# Patient Record
Sex: Female | Born: 1935 | Race: Black or African American | Hispanic: No | Smoking: Former smoker
Health system: Southern US, Community
[De-identification: ages and names within clinical notes are randomized; demographics above are authoritative.]

## PROBLEM LIST (undated history)

## (undated) DIAGNOSIS — I2699 Other pulmonary embolism without acute cor pulmonale: Secondary | ICD-10-CM

## (undated) DIAGNOSIS — J449 Chronic obstructive pulmonary disease, unspecified: Secondary | ICD-10-CM

## (undated) DIAGNOSIS — I714 Abdominal aortic aneurysm, without rupture, unspecified: Secondary | ICD-10-CM

## (undated) DIAGNOSIS — Z9981 Dependence on supplemental oxygen: Secondary | ICD-10-CM

## (undated) DIAGNOSIS — I10 Essential (primary) hypertension: Secondary | ICD-10-CM

## (undated) DIAGNOSIS — M1990 Unspecified osteoarthritis, unspecified site: Secondary | ICD-10-CM

## (undated) DIAGNOSIS — E785 Hyperlipidemia, unspecified: Secondary | ICD-10-CM

## (undated) DIAGNOSIS — L089 Local infection of the skin and subcutaneous tissue, unspecified: Secondary | ICD-10-CM

## (undated) DIAGNOSIS — E119 Type 2 diabetes mellitus without complications: Secondary | ICD-10-CM

## (undated) HISTORY — DX: Abdominal aortic aneurysm, without rupture, unspecified: I71.40

## (undated) HISTORY — DX: Chronic obstructive pulmonary disease, unspecified: J44.9

## (undated) HISTORY — DX: Essential (primary) hypertension: I10

## (undated) HISTORY — DX: Local infection of the skin and subcutaneous tissue, unspecified: L08.9

## (undated) HISTORY — DX: Other pulmonary embolism without acute cor pulmonale: I26.99

## (undated) HISTORY — DX: Dependence on supplemental oxygen: Z99.81

## (undated) HISTORY — DX: Type 2 diabetes mellitus without complications: E11.9

## (undated) HISTORY — DX: Hyperlipidemia, unspecified: E78.5

## (undated) HISTORY — PX: VESICOVAGINAL FISTULA CLOSURE W/ TAH: SUR271

## (undated) HISTORY — DX: Unspecified osteoarthritis, unspecified site: M19.90

## (undated) HISTORY — DX: Abdominal aortic aneurysm, without rupture: I71.4

---

## 2000-09-29 ENCOUNTER — Ambulatory Visit (HOSPITAL_COMMUNITY): Admission: RE | Admit: 2000-09-29 | Discharge: 2000-09-29 | Payer: Self-pay | Admitting: Family Medicine

## 2000-09-29 ENCOUNTER — Encounter: Payer: Self-pay | Admitting: Family Medicine

## 2000-10-21 ENCOUNTER — Ambulatory Visit (HOSPITAL_COMMUNITY): Admission: RE | Admit: 2000-10-21 | Discharge: 2000-10-21 | Payer: Self-pay | Admitting: Family Medicine

## 2000-10-21 ENCOUNTER — Encounter: Payer: Self-pay | Admitting: Family Medicine

## 2001-10-11 ENCOUNTER — Encounter: Admission: RE | Admit: 2001-10-11 | Discharge: 2002-01-09 | Payer: Self-pay | Admitting: Family Medicine

## 2001-11-18 ENCOUNTER — Inpatient Hospital Stay (HOSPITAL_COMMUNITY): Admission: EM | Admit: 2001-11-18 | Discharge: 2001-11-18 | Payer: Self-pay | Admitting: Emergency Medicine

## 2005-03-07 ENCOUNTER — Emergency Department (HOSPITAL_COMMUNITY): Admission: EM | Admit: 2005-03-07 | Discharge: 2005-03-07 | Payer: Self-pay | Admitting: *Deleted

## 2010-01-23 ENCOUNTER — Inpatient Hospital Stay (HOSPITAL_COMMUNITY): Admission: EM | Admit: 2010-01-23 | Discharge: 2010-01-28 | Payer: Self-pay | Admitting: Emergency Medicine

## 2010-01-26 ENCOUNTER — Ambulatory Visit: Payer: Self-pay | Admitting: Vascular Surgery

## 2010-01-26 ENCOUNTER — Encounter (INDEPENDENT_AMBULATORY_CARE_PROVIDER_SITE_OTHER): Payer: Self-pay | Admitting: Internal Medicine

## 2010-02-13 ENCOUNTER — Ambulatory Visit: Payer: Self-pay | Admitting: Internal Medicine

## 2010-02-13 DIAGNOSIS — J189 Pneumonia, unspecified organism: Secondary | ICD-10-CM | POA: Insufficient documentation

## 2010-02-13 DIAGNOSIS — J449 Chronic obstructive pulmonary disease, unspecified: Secondary | ICD-10-CM | POA: Insufficient documentation

## 2010-02-13 DIAGNOSIS — J984 Other disorders of lung: Secondary | ICD-10-CM | POA: Insufficient documentation

## 2010-02-13 DIAGNOSIS — I2699 Other pulmonary embolism without acute cor pulmonale: Secondary | ICD-10-CM | POA: Insufficient documentation

## 2010-02-18 ENCOUNTER — Ambulatory Visit: Payer: Self-pay | Admitting: Internal Medicine

## 2010-03-17 ENCOUNTER — Ambulatory Visit: Payer: Self-pay | Admitting: Internal Medicine

## 2010-03-17 LAB — CONVERTED CEMR LAB: Vit D, 25-Hydroxy: 19 ng/mL — ABNORMAL LOW (ref 30–89)

## 2010-03-18 ENCOUNTER — Telehealth: Payer: Self-pay | Admitting: Internal Medicine

## 2010-08-17 ENCOUNTER — Other Ambulatory Visit: Payer: Self-pay | Admitting: Internal Medicine

## 2010-08-17 DIAGNOSIS — R911 Solitary pulmonary nodule: Secondary | ICD-10-CM

## 2010-08-18 NOTE — Progress Notes (Signed)
Summary: vitamin d severe def  Phone Note Outgoing Call   Summary of Call: vitamin d is severely low. Pls tell her to taken Vitamin D3 and Calcium. Will repeat test on return Initial call taken by: Kalman Shan MD,  March 18, 2010 6:05 PM  Follow-up for Phone Call        Called, spoke with pt.  Pt informed of above results and recs per MR.  She is aware the rxs were sent to [facility].  SHe verbalized understanding of these instructions.   Follow-up by: Gweneth Dimitri RN,  March 19, 2010 5:21 PM    New/Updated Medications: VITAMIN D3 1000 UNIT TABS (CHOLECALCIFEROL) take 1 tablet daily DAILY VITAMINS FOR WOMEN  TABS (MULTIPLE VITAMINS-CALCIUM) 1 tablet daily after meals Prescriptions: DAILY VITAMINS FOR WOMEN  TABS (MULTIPLE VITAMINS-CALCIUM) 1 tablet daily after meals  #30 x 6   Entered and Authorized by:   Kalman Shan MD   Signed by:   Kalman Shan MD on 03/18/2010   Method used:   [facility]ronically to        [facility] [address] [phone]* (retail)       [address]       [place], [place]  [address]       Ph: [phone] or [phone]       Fax: [phone]   RxID:   [id number] VITAMIN D3 1000 UNIT TABS (CHOLECALCIFEROL) take 1 tablet daily  #30 x 6   Entered and Authorized by:   Kalman Shan MD   Signed by:   Kalman Shan MD on 03/18/2010   Method used:   Electronically to        [facility] [address] [phone]* (retail)       [address]       [place], [place]  [address]       Ph: [phone] or [phone]       Fax: [phone]   RxID:   [id number]

## 2010-08-18 NOTE — Assessment & Plan Note (Signed)
Summary: follow up per MR/ MS   Visit Type:  Follow-up Copy to:  [facility] Primary Kellis Topete/Referring Bethannie Iglehart:  Dr. Ronne Binning  CC:  Pt here for follow-up to review PFT results. Pt states no changes in breathing.  Marland Kitchen  History of Present Illness: IOV 02/13/2010: 77 year old AA female admitted early July 2011 for hempptysius related to Acute PE (only discernible risk factor was smoking per hospitalist). Other diagnosis was O2 dependent COPD, LL PNA, and 2 x 6 mm pulmonary nodules on CXR. All these diagnosis were newly made. She has now seeing me for first time for eval. She states she is better since discharge. However, she is not aware she has COPD. She has stopped smoking. She is not using o2 all the time (pox 87% ra at rest in office). She is noncomplaint with symbicort and has poor understanding of its role. Subjectively no dyspnea for ADLs at home (otherwise sedentary). Some mild cough. No hemoptysis. No weight los. Has quit smoking. Denies edema, chest pain, fever, chills.    REC: PFTs CXR at fu Continue symbicort Continue coumadin  OV March 17, 2010: Followup for COPD, LLL PNA in July 2011, PE July 2011, Pulmonary nodules. No interim complaints. STable health. STable dyspnea. Despite etensive education last visit about taking symbicort she is not using it. States "pharmacy did not have med". She is mostly non-compliant with o2 as well. States she is compliant with other meds. No other issues. PFTs 02/18/2010 shows GOLD STAGE 2 COPD - fev1 `.35L/64%, DLCO 36%. CXR today - shows clerance of LLL PNA   Preventive Screening-Counseling & Management  Alcohol-Tobacco     Smoking Status: quit     Smoking Cessation Counseling: yes     Smoke Cessation Stage: quit     Packs/Day: 0.5     Year Started: 1955     Year Quit: 2011     Pack years: 21     Tobacco Counseling: not to resume use of tobacco products  Current Medications (verified): 1)  Nicoderm Cq 14 Mg/24hr Pt24 (Nicotine) ....  Every 24 Hours For 10 Days  and Then 7 Mg For 10 Days 2)  Coumadin 5 Mg Tabs (Warfarin Sodium) .... Take On At 6:00 Pm On An Empty Stomach 3)  Oxygen 1-2 Liters .... 24/7 4)  Glipizide 5 Mg Tabs (Glipizide) .... By Mouth Two Times A Day 5)  Lovastatin 20 Mg Tabs (Lovastatin) .... By Mouth Every Night At Sleep 6)  Maxzide-25 37.5-25 Mg Tabs (Triamterene-Hctz) .... One Tablet Daily 7)  Toprol Xl 100 Mg Xr24h-Tab (Metoprolol Succinate) .... By Mouth Daily 8)  Metformin Hcl 500 Mg Tabs (Metformin Hcl) .Marland Kitchen.. 1 Tablet By Mouth Twice A Day  Allergies (verified): 1)  ! Pcn  Past History:  Family History: Last updated: 02/13/2010 Heart disease--Mother, 3 sisters Clotting disorder--daughter, sons  Social History: Last updated: 02/13/2010 Occupation--retired--domestic worker Lives with grandchildren Widowed Patient states former smoker. quit 01/23/2010. 1/2 ppd. Started smoking age 72   Denies alcohol or illicit   drug use.  She is a retired Ambulance person.  Her grandson lives with   her.   Risk Factors: Smoking Status: quit (03/17/2010) Packs/Day: 0.5 (03/17/2010)  Past medical, surgical, family and social histories (including risk factors) reviewed, and no changes noted (except as noted below).  Past Medical History: Reviewed history from 02/12/2010 and no changes required. Acute pulmonary embolism COPD DM Dyslipidemia hypertension  Past Surgical History: Reviewed history from 02/13/2010 and no changes required. hysterectomy  Past Pulmonary History:  Pulmonary History: DATE OF ADMISSION:  01/23/2010   DATE OF DISCHARGE:  01/28/2010      DISCHARGE DIAGNOSIS:   1. Acute pulmonary embolism. - presented wit hhemoptysis  2. Chronic obstructive pulmonary disease, oxygen dependent.   3. Diabetes mellitus.   4. Dyslipidemia.   5. Hypertension.   6. Tobacco abuse, has a desire to quit after discharge.    7. 6mm pulmonary nodules x 2 8. Small Left basal infiltrate and  atelectasis on CXR/CT  Family History: Reviewed history from 02/13/2010 and no changes required. Heart disease--Mother, 3 sisters Clotting disorder--daughter, sons  Social History: Reviewed history from 02/13/2010 and no changes required. Occupation--retired--domestic worker Lives with grandchildren Widowed Patient states former smoker. quit 01/23/2010. 1/2 ppd. Started smoking age 26   Denies alcohol or illicit   drug use.  She is a retired Ambulance person.  Her grandson lives with   her.  Smoking Status:  quit Packs/Day:  0.5  Review of Systems       The patient complains of shortness of breath with activity.  The patient denies shortness of breath at rest, productive cough, non-productive cough, coughing up blood, chest pain, irregular heartbeats, acid heartburn, indigestion, loss of appetite, weight change, abdominal pain, difficulty swallowing, sore throat, tooth/dental problems, headaches, nasal congestion/difficulty breathing through nose, sneezing, itching, ear ache, anxiety, depression, hand/feet swelling, joint stiffness or pain, rash, change in color of mucus, and fever.    Vital Signs:  Patient profile:   77 year old female Height:      65 inches Weight:      169.50 pounds BMI:     28.31 O2 Sat:      98 % on 2 L/min Temp:     97.8 degrees F oral Pulse rate:   78 / minute BP sitting:   132 / 72  (right arm) Cuff size:   regular  Vitals Entered By: Carron Curie CMA (March 17, 2010 1:53 PM)  O2 Flow:  2 L/min  Serial Vital Signs/Assessments:  Comments: Ambulatory Pulse Oximetry  Resting; HR__78___    02 Sat_97____ room air  Lap1 (185 feet)   HR___94__   02 Sat__91___ Lap2 (185 feet)   HR__98___   02 Sat__87___  placed pt on 2 liters o2 and sats increased to 97% after 2 mins   Lap3 (185 feet)   HR_____   02 Sat_____  ___Test Completed without Difficulty _x__Test Stopped due pt desaturated   By: Carron Curie CMA   CC: Pt here for follow-up to  review PFT results. Pt states no changes in breathing.     Physical Exam  General:  well developed, well nourished, in no acute distresson supplemental oxygen.   Head:  normocephalic and atraumatic Eyes:  PERRLA/EOM intact; conjunctiva and sclera clear Ears:  TMs intact and clear with normal canals Nose:  no deformity, discharge, inflammation, or lesions Mouth:  no deformity or lesions Neck:  no masses, thyromegaly, or abnormal cervical nodes Chest Wall:  no deformities noted Lungs:  clear bilaterally to auscultation and percussion Heart:  regular rate and rhythm, S1, S2 without murmurs, rubs, gallops, or clicks Abdomen:  bowel sounds positive; abdomen soft and non-tender without masses, or organomegaly Msk:  no deformity or scoliosis noted with normal posture Pulses:  pulses normal Extremities:  no clubbing, cyanosis, edema, or deformity noted Neurologic:  CN II-XII grossly intact with normal reflexes, coordination, muscle strength and tone Skin:  intact without lesions or rashes Cervical Nodes:  no significant adenopathy Axillary Nodes:  no significant adenopathy Psych:  alert and cooperative; normal mood and affect; normal attention span and concentration   MISC. Report  Procedure date:  02/18/2010  Findings:      PFTs 02/18/2010 shows GOLD STAGE 2 COPD - fev1 `.35L/64%, DLCO 36%  Comments:      independently reviewed trace  CXR  Procedure date:  03/17/2010  Findings:      shows clearance of LLL PNA  Comments:      indepoendently reviewed. Official report pending  Impression & Recommendations:  Problem # 1:  PNEUMONIA, ORGANISM UNSPECIFIED (ICD-486) Assessment Improved Cleared on CXR today.  plan no further fu Orders: T-2 View CXR (71020TC) Est. Patient Level IV ([address])  Problem # 2:  C O P D (ICD-496) Assessment: Unchanged Gold stage 2 COPD. STable diseaes. Desaturated 03/17/2010 after 185 feet of exertion on room air. Non compliant with symbicort for no  clear reason and with O2.  PLAN reeducated on copd stop symbicort try spiriva -1 pouff daily (simpler mdi) test for o2 need check Vitamin D next visit check alpha 1 at fu will refer rehab contnue o2  Problem # 3:  PULMONARY NODULE (ICD-518.89) Assessment: Comment Only  2 x 6 mm nodules. Smoking hx  plan repeat ct end of 2011 (6th month CT)  Orders: Est. Patient Level IV (40981) Radiology Referral (Radiology)  Problem # 4:  PE (ICD-415.19) Assessment: Unchanged  Her updated medication list for this problem includes:    Coumadin 5 Mg Tabs (Warfarin sodium) .Marland Kitchen... Take on at 6:00 pm on an empty stomach  Orders: T- * Misc. Laboratory test [phone]) Est. Patient Level IV ([address]56)  continue coumadin will need 6 month minimum then will dc coumadin and check hypercoag panel and decide if she should take it longer currently tolerating it well Her updated medication list for this problem includes:    Coumadin 5 Mg Tabs (Warfarin sodium) .Marland Kitchen... Take on at 6:00 pm on an empty stomach  Medications Added to Medication List This Visit: 1)  Spiriva Handihaler 18 Mcg Caps (Tiotropium bromide monohydrate) .... One puffs in handihaler daily  Other Orders: Prescription Created Electronically [phone]) HFA Instruction [phone])  Patient Instructions: 1)  #COPD 2)  YOu have moderate COPD 3)  Nurse will walk  you on room air to look at your oxygen levels 4)  stop symbicort 5)  start spiriva 1 puff daily - take 2 samples 6)  learn technique from my nurse 7)  next visit we will check alpha 1 antitypsin gene 8)  #PNEUMONIA 9)  this has cleared up on cxr today  10)  #LUNG NODULE 11)  need Ct scan chest end of this year 12)  #BLOOD CLOT 13)  continue coumadin - where are you getting bood checked ? 14)  #HEALTH MAINTENANCE 15)  get flu shot asap 16)  check vitamin D level today 17)  #FOLLOWUP 18)  return in 6 months with CT scan chst 19)  call or come sooner for any new problems 20)   stay away from sick people in fall/winter Prescriptions: SPIRIVA HANDIHALER 18 MCG  CAPS (TIOTROPIUM BROMIDE MONOHYDRATE) one puffs in handihaler daily  #1 x 6   Entered and Authorized by:   Kalman Shan MD   Signed by:   Kalman Shan MD on 03/17/2010   Method used:   [facility]ically to        [facility] [address] [phone]* (retail)  [address]       [place], [place]  [address]       Ph: [phone] or [phone]       Fax: [phone]   RxID:   [id number]

## 2010-08-18 NOTE — Assessment & Plan Note (Signed)
Summary: copd,bronchiactesis/apc   Visit Type:  Initial Consult Copy to:  [facility] Primary Provider/Referring Provider:  Dr. Ronne Binning  CC:  possible COPD and Bronchiactesis, Pt states breathing is doing fine, non producitve cough, and pt states she is better since being out of the hospital.  History of Present Illness: IOV 02/13/2010: 77 year old AA female admitted early July 2011 for hempptysius related to Acute PE (only discernible risk factor was smoking per hospitalist). Other diagnosis was O2 dependent COPD, LL PNA, and 2 x 6 mm pulmonary nodules on CXR. All these diagnosis were newly made. She has now seeing me for first time for eval. She states she is better since discharge. However, she is not aware she has COPD. She has stopped smoking. She is not using o2 all the time (pox 87% ra at rest in office). She is noncomplaint with symbicort and has poor understanding of its role. Subjectively no dyspnea for ADLs at home (otherwise sedentary). Some mild cough. No hemoptysis. No weight los. Has quit smoking. Denies edema, chest pain, fever, chills.   Preventive Screening-Counseling & Management  Alcohol-Tobacco     Smoking Status: quit < 6 months     Packs/Day: 1/2ppd     Year Started: 1955     Year Quit: 2011     Pack years: 57  Current Medications (verified): 1)  Symbicort 160-4.5 Mcg/act Aero (Budesonide-Formoterol Fumarate) .... One Puff Two Times A Day 2)  Nicoderm Cq 14 Mg/24hr Pt24 (Nicotine) .... Every 24 Hours For 10 Days  and Then 7 Mg For 10 Days 3)  Coumadin 5 Mg Tabs (Warfarin Sodium) .... Take On At 6:00 Pm On An Empty Stomach 4)  Oxygen 1-2 Liters .... 24/7 5)  Glipizide 5 Mg Tabs (Glipizide) .... By Mouth Two Times A Day 6)  Lovastatin 20 Mg Tabs (Lovastatin) .... By Mouth Every Night At Sleep 7)  Maxzide-25 37.5-25 Mg Tabs (Triamterene-Hctz) .... One Tablet Daily 8)  Toprol Xl 100 Mg Xr24h-Tab (Metoprolol Succinate) .... By Mouth Daily 9)  Metformin Hcl 500 Mg  Tabs (Metformin Hcl) .Marland Kitchen.. 1 Tablet By Mouth Twice A Day  Allergies (verified): 1)  ! Pcn  Past History:  Past Medical History: Last updated: 02/12/2010 Acute pulmonary embolism COPD DM Dyslipidemia hypertension  Family History: Last updated: 02/13/2010 Heart disease--Mother, 3 sisters Clotting disorder--daughter, sons  Social History: Last updated: 02/13/2010 Occupation--retired--domestic worker Lives with grandchildren Widowed Patient states former smoker. quit 01/23/2010. 1/2 ppd. Started smoking age 47   Denies alcohol or illicit   drug use.  She is a retired Ambulance person.  Her grandson lives with   her.   Risk Factors: Smoking Status: quit < 6 months (02/13/2010) Packs/Day: 1/2ppd (02/13/2010)  Past Surgical History: hysterectomy  Past Pulmonary History:  Pulmonary History: DATE OF ADMISSION:  01/23/2010   DATE OF DISCHARGE:  01/28/2010      DISCHARGE DIAGNOSIS:   1. Acute pulmonary embolism. - presented wit hhemoptysis  2. Chronic obstructive pulmonary disease, oxygen dependent.   3. Diabetes mellitus.   4. Dyslipidemia.   5. Hypertension.   6. Tobacco abuse, has a desire to quit after discharge.    7. 6mm pulmonary nodules x 2 8. 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 quit 01/23/2010. 1/2 ppd. Started smoking age 73   Denies alcohol or illicit   drug use.  She  is a retired Ambulance person.  Her grandson lives with   her.  Smoking Status:  quit < 6 months Packs/Day:  1/2ppd Pack years:  27  Review of Systems       Haven't coughed up blood since out of hospital  Vital Signs:  Patient profile:   77 year old female Height:      65 inches Weight:      156 pounds BMI:     26.05 O2 Sat:      95 % on 2 L/min Temp:     98.4  degrees F oral Pulse rate:   85 / minute BP sitting:   130 / 80  (right arm) Cuff size:   regular  Vitals Entered By: Carver Fila (February 13, 2010 2:27 PM)  O2 Flow:  2 L/min CC: possible COPD and Bronchiactesis, Pt states breathing is doing fine, non producitve cough, pt states she is better since being out of the hospital Comments meds and allergies udpated Phone number updated Carver Fila  February 13, 2010 2:26 PM    Physical Exam  General:  well developed, well nourished, in no acute distresson supplemental oxygen.   Head:  normocephalic and atraumatic Eyes:  PERRLA/EOM intact; conjunctiva and sclera clear Ears:  TMs intact and clear with normal canals Nose:  no deformity, discharge, inflammation, or lesions Mouth:  no deformity or lesions Neck:  no masses, thyromegaly, or abnormal cervical nodes Chest Wall:  no deformities noted Lungs:  clear bilaterally to auscultation and percussion Heart:  regular rate and rhythm, S1, S2 without murmurs, rubs, gallops, or clicks Abdomen:  bowel sounds positive; abdomen soft and non-tender without masses, or organomegaly Msk:  no deformity or scoliosis noted with normal posture Pulses:  pulses normal Extremities:  no clubbing, cyanosis, edema, or deformity noted Neurologic:  CN II-XII grossly intact with normal reflexes, coordination, muscle strength and tone Skin:  intact without lesions or rashes Cervical Nodes:  no significant adenopathy Axillary Nodes:  no significant adenopathy Psych:  alert and cooperative; normal mood and affect; normal attention span and concentration   CT of Chest  Procedure date:  01/24/2010  Findings:      Positive CTA chest for pulmonary embolism, with multiple pulmonary   emboli identified bilaterally.   COPD with bronchitic changes, bibasilar atelectasis and   bronchiectasis.   Two nonspecific subpleural nodular foci are identified in the right   upper lobe, largest 6 mm diameter, recommendations  below.  Comments:      independently reviewed  Impression & Recommendations:  Problem # 1:  C O P D (ICD-496) Assessment New New diagnosis. Looks like she is o2 dependent. No understanding of disease. No understanding of need to be on maintenatnce mdi. BRief edcuation done. Symbicort intake rules emphasized. MDI tech taught. Return with PFTs. AT return wil check Vitamin D and  alpha-1-antitrypsin  Problem # 2:  PULMONARY NODULE (ICD-518.89) Assessment: New  2 x 6 mm nodules. Smoking hx  plan repeat ct end 2011 (6th month CT)  Orders: Consultation Level V ([address])  Problem # 3:  PNEUMONIA, ORGANISM UNSPECIFIED (ICD-486) Assessment: New  LLL PNA  plan repeat cxr 1 month The following medications were removed from the medication list:    Avelox 400 Mg Tabs (Moxifloxacin hcl) ..... Once daily fro 5 days  Orders: Consultation Level V ([address]454)  Problem # 4:  PE (ICD-415.19) Assessment: New  continue coumadin will need 6 month minimum then will dc coumadin and check hypercoag panel  and decide if she should take it longer currently tolerating it well Her updated medication list for this problem includes:    Coumadin 5 Mg Tabs (Warfarin sodium) .Marland Kitchen... Take on at 6:00 pm on an empty stomach  Orders: Consultation Level V ([address])  Medications Added to Medication List This Visit: 1)  Metformin Hcl 500 Mg Tabs (Metformin hcl) .Marland Kitchen.. 1 tablet by mouth twice a day  Patient Instructions: 1)  Please have full breathing test and return to see me 2)  when you come back we will check vitamin D level  and your blood for alpha 1 antitrypsin which is a genetic cause of copd 3)  you have copd which is lung damage from smoking 4)  Please take your symbicort inhaler 2 puff two times a day 5)  show your technique and learn it from my nurse 6)  take a sample symbicort with you 7)  cxr at followup in 1 month 8)  return after pft  Appended Document: copd,bronchiactesis/apc pft 02/18/2010  shows gold stage 2 copd. Pleaes ensure fu.   Appended Document: copd,bronchiactesis/apc ATCP x1 no vm to leave a message will try to call back later  Appended Document: copd,bronchiactesis/apc Called and informed pt of the results and pt stated she verbally understood. Pt has a f/u with MR for 03/17/2010 at 1:30 pm

## 2010-08-18 NOTE — Miscellaneous (Signed)
Summary: Orders Update pft charges  Clinical Lists Changes  Orders: Added new Service order of Carbon Monoxide diffusing w/capacity (94720) - Signed Added new Service order of Lung Volumes (94240) - Signed Added new Service order of Spirometry (Pre & Post) (94060) - Signed 

## 2010-09-16 ENCOUNTER — Ambulatory Visit (INDEPENDENT_AMBULATORY_CARE_PROVIDER_SITE_OTHER)
Admission: RE | Admit: 2010-09-16 | Discharge: 2010-09-16 | Disposition: A | Discharge status: Home / Self Care | Payer: Medicare Other | Source: Ambulatory Visit | Attending: Internal Medicine | Admitting: Internal Medicine

## 2010-09-16 DIAGNOSIS — J984 Other disorders of lung: Secondary | ICD-10-CM

## 2010-09-16 DIAGNOSIS — R911 Solitary pulmonary nodule: Secondary | ICD-10-CM

## 2010-09-21 ENCOUNTER — Encounter: Payer: Self-pay | Admitting: Internal Medicine

## 2010-09-21 ENCOUNTER — Ambulatory Visit (INDEPENDENT_AMBULATORY_CARE_PROVIDER_SITE_OTHER): Payer: Medicare Other | Admitting: Internal Medicine

## 2010-09-21 ENCOUNTER — Other Ambulatory Visit: Payer: Self-pay | Admitting: Internal Medicine

## 2010-09-21 DIAGNOSIS — J449 Chronic obstructive pulmonary disease, unspecified: Secondary | ICD-10-CM

## 2010-09-21 DIAGNOSIS — E559 Vitamin D deficiency, unspecified: Secondary | ICD-10-CM | POA: Insufficient documentation

## 2010-09-21 DIAGNOSIS — J984 Other disorders of lung: Secondary | ICD-10-CM

## 2010-09-21 DIAGNOSIS — R911 Solitary pulmonary nodule: Secondary | ICD-10-CM

## 2010-09-29 NOTE — Assessment & Plan Note (Addendum)
Summary: f/u pulmonary nodule   Visit Type:  Follow-up Copy to:  [facility] Primary Provider/Referring Provider:  Dr. Ronne Binning  CC:  Pt here for follow-up. Marland Kitchen  History of Present Illness:  77 year old AA female with Idiopathic Acute PE July 2011 , O2 dependent Gold stage 2 COPD (87% RA at rest, FEv1 64%, DLCO 36%),  2 x 6 mm pulmonary nodules on CXR  and Vit D Deficiency. . All these diagnosis were newly made.    September 21, 2010: Fu for above. Poor historian. Reports stability. Somewhat compliant wiht spiriva. Alpha 1 sent today. Now on Vit D replacement, she is not sure it is helping but insists she is compliant.  Using o2 only at night. 6 mont CT shows stability of pulmonary nodules. Continues with coumadin per hx. Has completed over 6 months of coumadin - monitoring by PMD. She states INR is > 2 most of the time. No new issues.    Preventive Screening-Counseling & Management  Alcohol-Tobacco     Smoking Status: quit     Smoking Cessation Counseling: yes     Smoke Cessation Stage: quit     Packs/Day: 0.5     Year Started: 1957     Year Quit: 2011     Pack years: 55     Tobacco Counseling: not to resume use of tobacco products  Current Medications (verified): 1)  Nicoderm Cq 14 Mg/24hr Pt24 (Nicotine) .... Every 24 Hours For 10 Days  and Then 7 Mg For 10 Days 2)  Coumadin 5 Mg Tabs (Warfarin Sodium) .... Take On At 6:00 Pm On An Empty Stomach 3)  Oxygen 1-2 Liters .... 24/7 4)  Glipizide 5 Mg Tabs (Glipizide) .... By Mouth Two Times A Day 5)  Lovastatin 20 Mg Tabs (Lovastatin) .... By Mouth Every Night At Sleep 6)  Maxzide-25 37.5-25 Mg Tabs (Triamterene-Hctz) .... One Tablet Daily 7)  Toprol Xl 100 Mg Xr24h-Tab (Metoprolol Succinate) .... By Mouth Daily 8)  Metformin Hcl 500 Mg Tabs (Metformin Hcl) .Marland Kitchen.. 1 Tablet By Mouth Twice A Day 9)  Spiriva Handihaler 18 Mcg Caps (Tiotropium Bromide Monohydrate) .... Once Daily 10)  Vitamin D3 1000 Unit Tabs (Cholecalciferol) ....  Take 1 Tablet By Mouth Once A Day 11)  Multivitamins  Tabs (Multiple Vitamin) .... Take 1 Tablet By Mouth Once A Day  Allergies (verified): 1)  ! Pcn  Past History:  Past medical, surgical, family and social histories (including risk factors) reviewed, and no changes noted (except as noted below).  Past Medical History: Reviewed history from 02/12/2010 and no changes required. Acute pulmonary embolism COPD DM Dyslipidemia hypertension  Past Surgical History: Reviewed history from 02/13/2010 and no changes required. hysterectomy  Past Pulmonary History:  Pulmonary History: DATE OF ADMISSION:  01/23/2010   DATE OF DISCHARGE:  01/28/2010      DISCHARGE DIAGNOSIS:   1. Acute pulmonary embolism. - presented wit hhemoptysis  2. Chronic obstructive pulmonary disease, oxygen dependent.   3. Diabetes mellitus.   4. Dyslipidemia.   5. Hypertension.   6. Tobacco abuse, has a desire to quit after discharge.    7. 6mm pulmonary nodules x 2 8. Small Left basal infiltrate and atelectasis on CXR/CT  Family History: Reviewed history from 02/13/2010 and no changes required. Heart disease--Mother, 3 sisters Clotting disorder--daughter, sons  Social History: Reviewed history from 02/13/2010 and no changes required. Occupation--retired--domestic worker Lives with grandchildren Widowed Patient states former smoker. quit 01/23/2010. 1/2 ppd. Started smoking age 65  Denies alcohol or illicit   drug use.  She is a retired Ambulance person.  Her grandson lives with   her.   Review of Systems       The patient complains of shortness of breath with activity, productive cough, and change in color of mucus.  The patient denies shortness of breath at rest, non-productive cough, coughing up blood, chest pain, irregular heartbeats, acid heartburn, indigestion, loss of appetite, weight change, abdominal pain, difficulty swallowing, sore throat, tooth/dental problems, headaches, nasal  congestion/difficulty breathing through nose, sneezing, itching, ear ache, anxiety, depression, hand/feet swelling, joint stiffness or pain, rash, and fever.    Vital Signs:  Patient profile:   77 year old female Height:      65 inches Weight:      184 pounds BMI:     30.73 O2 Sat:      91 % on Room air Temp:     97.9 degrees F oral Pulse rate:   81 / minute BP sitting:   124 / 88  (right arm) Cuff size:   regular  Vitals Entered By: Carron Curie CMA (September 21, 2010 10:57 AM)  O2 Flow:  Room air CC: Pt here for follow-up.  Comments Medications reviewed with patient Carron Curie CMA  September 21, 2010 10:57 AM Daytime phone number verified with patient.    Physical Exam  General:  well developed, well nourished, in no acute distresson supplemental oxygen.   Head:  normocephalic and atraumatic Eyes:  PERRLA/EOM intact; conjunctiva and sclera clear Ears:  TMs intact and clear with normal canals Nose:  no deformity, discharge, inflammation, or lesions Mouth:  no deformity or lesions Neck:  no masses, thyromegaly, or abnormal cervical nodes Chest Wall:  no deformities noted Lungs:  clear bilaterally to auscultation and percussion Heart:  regular rate and rhythm, S1, S2 without murmurs, rubs, gallops, or clicks Abdomen:  bowel sounds positive; abdomen soft and non-tender without masses, or organomegaly Msk:  no deformity or scoliosis noted with normal posture Pulses:  pulses normal Extremities:  no clubbing, cyanosis, edema, or deformity noted Neurologic:  CN II-XII grossly intact with normal reflexes, coordination, muscle strength and tone Skin:  intact without lesions or rashes Cervical Nodes:  no significant adenopathy Axillary Nodes:  no significant adenopathy Psych:  alert and cooperative; normal mood and affect; normal attention span and concentration   CT of Chest  Procedure date:  09/16/2010  Findings:      unchanged pulmonary nodules   Comments:       independently reviewed  Impression & Recommendations:  Problem # 1:  PE (ICD-415.19) Assessment Unchanged Has completed over 6 months of coumadin Rx for idiopathic PE. I wil talk to her pmd and if INR >2 for most of this time, will stop coumadin and retest d-dimer and hypercoag profile to decide on daily aspirin versus continued coumadin Her updated medication list for this problem includes:    Coumadin 5 Mg Tabs (Warfarin sodium) .Marland Kitchen... Take on at 6:00 pm on an empty stomach  Orders: Rehabilitation Referral (Rehab)  Problem # 2:  PULMONARY NODULE (ICD-518.89) Assessment: Unchanged  Orders: Radiology Referral (Radiology) Est. Patient Level IV (99214)  2 x 6 mm nodules. Smoking hx July 2011 No change on CT feb 2012  plan repeat ct end Aug 2012 (1 year CT)  Problem # 3:  C O P D (ICD-496) Assessment: Unchanged  Gold stage 2 COPD. STable diseaes. Desaturated 03/17/2010 after 185 feet of exertion on  room air. Semi compliant with O2 and spiriva  plan daily spiriva use o2 await alpha 1 - sent today advisedrehab again -she refused again evaluate for COPD [facility] STUDY - she is interested in this  Problem # 4:  UNSPECIFIED VITAMIN D DEFICIENCY (ICD-268.9) Assessment: Unchanged  continue vit d3 1000 units daily and calcium daily recheck levels at fu  Orders: Est. Patient Level IV (16109)  Medications Added to Medication List This Visit: 1)  Spiriva Handihaler 18 Mcg Caps (Tiotropium bromide monohydrate) .... Once daily 2)  Vitamin D3 1000 Unit Tabs (Cholecalciferol) .... Take 1 tablet by mouth once a day 3)  Multivitamins Tabs (Multiple vitamin) .... Take 1 tablet by mouth once a day  Other Orders: Prescription Created Electronically [id number])  Patient Instructions: 1)  #COPD 2)  YOu have moderate COPD 3)  continue spiriva 1 puff daily - take 2 samples 4)   we will await  alpha 1 antitypsin gene result that was checked  5)  atend pulm rehab 6)  i will have research  nurse contact you 7)  #LUNG NODULE 8)  need Ct scan chest in 6 months 9)  #BLOOD CLOT 10)  - after I talk to Dr. Aline Brochure you can stop coumadin 11)  #VIT D DEF 12)  - contnue vit d and calcium 13)  #FOLLOWUP 14)  return in 6 months with CT scan chst 15)  call or come sooner for any new problems 16)  stay away from sick people in fall/winter Prescriptions: SPIRIVA HANDIHALER 18 MCG CAPS (TIOTROPIUM BROMIDE MONOHYDRATE) once daily  #1 x 6   Entered and Authorized by:   Kalman Shan MD   Signed by:   Kalman Shan MD on 09/21/2010   Method used:   Electronically to        [facility] [address] [id number][facility] (retail)       [address]       [place], [place]  [address]       Ph: [phone] or [phone]       Fax: [phone]   RxID:   [id number]   Appended Document: f/u pulmonary nodule gget hold of PMD for me please  Appended Document: f/u pulmonary nodule ATC to call PCP several times no answer and unable to leave a message. Sounded like call was transferred to MD cell number.

## 2010-10-04 LAB — CBC
HCT: 37.3 % (ref 36.0–46.0)
HCT: 37.6 % (ref 36.0–46.0)
HCT: 39.5 % (ref 36.0–46.0)
Hemoglobin: 12.6 g/dL (ref 12.0–15.0)
Hemoglobin: 12.6 g/dL (ref 12.0–15.0)
MCH: 28 pg (ref 26.0–34.0)
MCH: 28.2 pg (ref 26.0–34.0)
MCH: 28.4 pg (ref 26.0–34.0)
MCH: 28.5 pg (ref 26.0–34.0)
MCHC: 33.5 g/dL (ref 30.0–36.0)
MCHC: 33.6 g/dL (ref 30.0–36.0)
MCHC: 33.9 g/dL (ref 30.0–36.0)
MCV: 83.6 fL (ref 78.0–100.0)
MCV: 83.8 fL (ref 78.0–100.0)
MCV: 83.8 fL (ref 78.0–100.0)
MCV: 84.1 fL (ref 78.0–100.0)
MCV: 84.3 fL (ref 78.0–100.0)
Platelets: 159 10*3/uL (ref 150–400)
Platelets: 172 10*3/uL (ref 150–400)
RBC: 4.49 MIL/uL (ref 3.87–5.11)
RBC: 4.69 MIL/uL (ref 3.87–5.11)
RDW: 15.1 % (ref 11.5–15.5)
RDW: 15.1 % (ref 11.5–15.5)
RDW: 15.3 % (ref 11.5–15.5)
WBC: 7.7 10*3/uL (ref 4.0–10.5)
WBC: 9.3 10*3/uL (ref 4.0–10.5)

## 2010-10-04 LAB — GLUCOSE, CAPILLARY
Glucose-Capillary: 111 mg/dL — ABNORMAL HIGH (ref 70–99)
Glucose-Capillary: 113 mg/dL — ABNORMAL HIGH (ref 70–99)
Glucose-Capillary: 116 mg/dL — ABNORMAL HIGH (ref 70–99)
Glucose-Capillary: 120 mg/dL — ABNORMAL HIGH (ref 70–99)
Glucose-Capillary: 123 mg/dL — ABNORMAL HIGH (ref 70–99)
Glucose-Capillary: 127 mg/dL — ABNORMAL HIGH (ref 70–99)
Glucose-Capillary: 141 mg/dL — ABNORMAL HIGH (ref 70–99)
Glucose-Capillary: 145 mg/dL — ABNORMAL HIGH (ref 70–99)
Glucose-Capillary: 163 mg/dL — ABNORMAL HIGH (ref 70–99)
Glucose-Capillary: 73 mg/dL (ref 70–99)
Glucose-Capillary: 78 mg/dL (ref 70–99)
Glucose-Capillary: 80 mg/dL (ref 70–99)

## 2010-10-04 LAB — HEPARIN LEVEL (UNFRACTIONATED)
Heparin Unfractionated: 0.26 IU/mL — ABNORMAL LOW (ref 0.30–0.70)
Heparin Unfractionated: 0.36 IU/mL (ref 0.30–0.70)

## 2010-10-04 LAB — HEMOGLOBIN A1C
Hgb A1c MFr Bld: 6.6 % — ABNORMAL HIGH (ref <5.7)
Mean Plasma Glucose: 143 mg/dL — ABNORMAL HIGH (ref <117)

## 2010-10-04 LAB — BASIC METABOLIC PANEL
BUN: 9 mg/dL (ref 6–23)
CO2: 26 mEq/L (ref 19–32)
Chloride: 101 mEq/L (ref 96–112)
Glucose, Bld: 128 mg/dL — ABNORMAL HIGH (ref 70–99)
Potassium: 4.3 mEq/L (ref 3.5–5.1)
Sodium: 137 mEq/L (ref 135–145)

## 2010-10-04 LAB — COMPREHENSIVE METABOLIC PANEL
AST: 16 U/L (ref 0–37)
Albumin: 3.4 g/dL — ABNORMAL LOW (ref 3.5–5.2)
BUN: 13 mg/dL (ref 6–23)
Calcium: 8.6 mg/dL (ref 8.4–10.5)
Creatinine, Ser: 1.04 mg/dL (ref 0.4–1.2)
GFR calc Af Amer: >60 mL/min (ref >60)
GFR calc non Af Amer: 52 mL/min — ABNORMAL LOW (ref >60)
Total Bilirubin: 0.6 mg/dL (ref 0.3–1.2)

## 2010-10-04 LAB — DIFFERENTIAL
Basophils Absolute: 0 10*3/uL (ref 0.0–0.1)
Basophils Relative: 0 % (ref 0–1)
Eosinophils Absolute: 0.1 10*3/uL (ref 0.0–0.7)
Eosinophils Relative: 1 % (ref 0–5)
Monocytes Absolute: 0.1 10*3/uL (ref 0.1–1.0)
Neutro Abs: 8.8 10*3/uL — ABNORMAL HIGH (ref 1.7–7.7)

## 2010-10-04 LAB — PROTIME-INR
INR: 1.19 (ref 0.00–1.49)
INR: 1.46 (ref 0.00–1.49)
Prothrombin Time: 17.6 seconds — ABNORMAL HIGH (ref 11.6–15.2)

## 2010-10-04 LAB — T4, FREE: Free T4: 0.92 ng/dL (ref 0.80–1.80)

## 2010-10-04 LAB — POCT I-STAT, CHEM 8
Calcium, Ion: 1.07 mmol/L — ABNORMAL LOW (ref 1.12–1.32)
Glucose, Bld: 212 mg/dL — ABNORMAL HIGH (ref 70–99)
HCT: 41 % (ref 36.0–46.0)
Hemoglobin: 13.9 g/dL (ref 12.0–15.0)
TCO2: 26 mmol/L (ref 0–100)

## 2010-10-05 ENCOUNTER — Telehealth: Payer: Self-pay | Admitting: Internal Medicine

## 2010-10-15 ENCOUNTER — Telehealth: Payer: Self-pay | Admitting: *Deleted

## 2010-10-15 ENCOUNTER — Encounter: Payer: Self-pay | Admitting: *Deleted

## 2010-10-15 NOTE — Telephone Encounter (Signed)
Summary of Call: given difficulty getting hold of PMD, have patinet come in to discuss coumadin Rx and have her bring in her PMD contact information wiht her Initial call taken by: Alejandra Shan MD,  October 05, 2010 9:47 PM  ATC pt several times. No answer and no voicemail...number sounds like a fax machine. I will send a letter to the patient to ask her to call our office to schedule an appointment. Alejandra [name], CMA

## 2010-10-20 ENCOUNTER — Telehealth: Payer: Self-pay | Admitting: Internal Medicine

## 2010-10-20 NOTE — Progress Notes (Signed)
Summary: bring patient in for coumadin Rx discussion  Phone Note Outgoing Call   Summary of Call: given difficulty getting hold of PMD, have patinet come in to discuss coumadin Rx and have her bring in her PMD contact information wiht her Initial call taken by: Kalman Shan MD,  October 05, 2010 9:47 PM  Follow-up for Phone Call        see epic. Carron Curie CMA  October 15, 2010 11:57 AM

## 2010-10-20 NOTE — Telephone Encounter (Signed)
Per Victorino Dike pt just need ov and bring all of her pcp information and meds to visit. Pt is scheduled to come in 4/20 at 3:30. I have added pt pcp into pt chart.

## 2010-10-21 ENCOUNTER — Encounter: Payer: Self-pay | Admitting: Internal Medicine

## 2010-11-06 ENCOUNTER — Ambulatory Visit (INDEPENDENT_AMBULATORY_CARE_PROVIDER_SITE_OTHER): Payer: Medicare Other | Admitting: Internal Medicine

## 2010-11-06 ENCOUNTER — Encounter: Payer: Self-pay | Admitting: Internal Medicine

## 2010-11-06 ENCOUNTER — Other Ambulatory Visit (INDEPENDENT_AMBULATORY_CARE_PROVIDER_SITE_OTHER): Payer: Medicare Other

## 2010-11-06 VITALS — BP 118/70 | HR 91 | Temp 98.2°F | Ht 65.0 in | Wt 184.2 lb

## 2010-11-06 DIAGNOSIS — J984 Other disorders of lung: Secondary | ICD-10-CM

## 2010-11-06 DIAGNOSIS — I2699 Other pulmonary embolism without acute cor pulmonale: Secondary | ICD-10-CM

## 2010-11-06 DIAGNOSIS — J449 Chronic obstructive pulmonary disease, unspecified: Secondary | ICD-10-CM

## 2010-11-06 DIAGNOSIS — Z7901 Long term (current) use of anticoagulants: Secondary | ICD-10-CM

## 2010-11-06 LAB — PROTIME-INR: Prothrombin Time: 22 s — ABNORMAL HIGH (ref 10.2–12.4)

## 2010-11-06 NOTE — Assessment & Plan Note (Signed)
diopathic PE in [id number]. Coumadin monitoring by PMD. No idea if she has been therapeutic. History fluctuates. Hard to get hold of PMD who apparently works only after 17:30. Tolerating coumadin well. S/p 9 month Rx. Will get D-dimer, Pt, ptt today. Will try to get hold ofPMD and decide on stopping coumadin

## 2010-11-06 NOTE — Assessment & Plan Note (Signed)
Next ct chest August 2012

## 2010-11-06 NOTE — Progress Notes (Signed)
  Subjective:    Patient ID: Alejandra [name], female    DOB: 16-Nov-1931, 77 y.o.   MRN: [id number]  HPI 77 year old AA female with Idiopathic Acute PE July 2011 , O2 dependent Gold stage 2 COPD (87% RA at rest, FEv1 64%, DLCO 36%),  2 x 6 mm pulmonary nodules on CXR  and Vit D Deficiency.   Main reason is for discussing stopping coumadin Rx. She has been on it for 9 months. No compliactions. MOnitoring by PMD. Poor historian. Unclear from her if INR is >2. Hard to get hold of PMD office because he works onlyu after 17:30. No reply from his office about INR levels. She reports compliance. Not interested in rehab   Review of Systems Constitutional:   No  weight loss, night sweats,  Fevers, chills, fatigue, lassitude. HEENT:   No headaches,  Difficulty swallowing,  Tooth/dental problems,  Sore throat,                No sneezing, itching, ear ache, nasal congestion, post nasal drip,   CV:  No chest pain,  Orthopnea, PND, swelling in lower extremities, anasarca, dizziness, palpitations  GI  No heartburn, indigestion, abdominal pain, nausea, vomiting, diarrhea, change in bowel habits, loss of appetite  Resp: No shortness of breath with exertion or at rest.  No excess mucus, no productive cough,  No non-productive cough,  No coughing up of blood.  No change in color of mucus.  No wheezing.  No chest wall deformity  Skin: no rash or lesions.  GU: no dysuria, change in color of urine, no urgency or frequency.  No flank pain.  MS:  No joint pain or swelling.  No decreased range of motion.  No back pain.  Psych:  No change in mood or affect. No depression or anxiety.  No memory loss.     Objective:   Physical Exam       normocephalic and atraumatic Eyes:  PERRLA/EOM intact; conjunctiva and sclera clear Ears:  TMs intact and clear with normal canals Nose:  no deformity, discharge, inflammation, or lesions Mouth:  no deformity or lesions Neck:  no masses, thyromegaly, or abnormal cervical  nodes Chest Wall:  no deformities noted Lungs:  clear bilaterally to auscultation and percussion Heart:  regular rate and rhythm, S1, S2 without murmurs, rubs, gallops, or clicks Abdomen:  bowel sounds positive; abdomen soft and non-tender without masses, or organomegaly Msk:  no deformity or scoliosis noted with normal posture Pulses:  pulses normal Extremities:  no clubbing, cyanosis, edema, or deformity noted Neurologic:  CN II-XII grossly intact with normal reflexes, coordination, muscle strength and tone Skin:  intact without lesions or rashes Cervical Nodes:  no significant adenopathy Axillary Nodes:  no significant adenopathy Psych:  alert and cooperative; normal mood and affect; normal attention span and concentration     Assessment & Plan:

## 2010-11-06 NOTE — Assessment & Plan Note (Signed)
Stable disease. Continue spiriva.

## 2010-11-06 NOTE — Patient Instructions (Addendum)
Please have blood work today Please give a good phone number that we can reach you easily and discuss blood results I will call Dr. Ronne Binning office today to discuss; we need all results of his blood work since [id number] pertaining to coumadin Followup 5 months from now after CT chest (this should have been ordered last visit) Continue medications

## 2010-11-09 ENCOUNTER — Telehealth: Payer: Self-pay | Admitting: Internal Medicine

## 2010-11-09 NOTE — Telephone Encounter (Signed)
Will forward back to Alejandra [name] to provide number to call this person back

## 2010-11-09 NOTE — Telephone Encounter (Signed)
Sorry I forgot the number and didn't write it down but she didn't want a return call unless we needed for her to schedule a consult for this patient she thought that she received them in error because there wasn't anything to indicate that patient needed to be seen there. Vedia Coffer

## 2010-11-17 ENCOUNTER — Telehealth: Payer: Self-pay | Admitting: Internal Medicine

## 2010-11-17 NOTE — Telephone Encounter (Signed)
Alejandra [name],   INR is therapeutic - so she is takning her coumadin. However, d-dimer still high. So best to continue coumadin for another 6 months. Please inform PMD and document (need to call them after 5pm) and inform patient. Please ensure fu per prior ov note

## 2010-11-17 NOTE — Telephone Encounter (Signed)
ATC home #, no answer, no voicemail. [id number] on cell number.Carron Curie, CMA

## 2010-11-22 ENCOUNTER — Emergency Department (HOSPITAL_COMMUNITY): Payer: Medicare Other

## 2010-11-22 ENCOUNTER — Inpatient Hospital Stay (HOSPITAL_COMMUNITY)
Admission: EM | Admit: 2010-11-22 | Discharge: 2010-11-25 | DRG: 445 | Disposition: A | Discharge status: Home / Self Care | Payer: Medicare Other | Attending: Internal Medicine | Admitting: Internal Medicine

## 2010-11-22 DIAGNOSIS — R109 Unspecified abdominal pain: Secondary | ICD-10-CM | POA: Diagnosis present

## 2010-11-22 DIAGNOSIS — R195 Other fecal abnormalities: Secondary | ICD-10-CM | POA: Diagnosis present

## 2010-11-22 DIAGNOSIS — E785 Hyperlipidemia, unspecified: Secondary | ICD-10-CM | POA: Diagnosis present

## 2010-11-22 DIAGNOSIS — J9610 Chronic respiratory failure, unspecified whether with hypoxia or hypercapnia: Secondary | ICD-10-CM | POA: Diagnosis present

## 2010-11-22 DIAGNOSIS — I714 Abdominal aortic aneurysm, without rupture, unspecified: Secondary | ICD-10-CM | POA: Diagnosis present

## 2010-11-22 DIAGNOSIS — Z86711 Personal history of pulmonary embolism: Secondary | ICD-10-CM

## 2010-11-22 DIAGNOSIS — F17200 Nicotine dependence, unspecified, uncomplicated: Secondary | ICD-10-CM | POA: Diagnosis present

## 2010-11-22 DIAGNOSIS — J449 Chronic obstructive pulmonary disease, unspecified: Secondary | ICD-10-CM | POA: Diagnosis present

## 2010-11-22 DIAGNOSIS — I10 Essential (primary) hypertension: Secondary | ICD-10-CM | POA: Diagnosis present

## 2010-11-22 DIAGNOSIS — J4489 Other specified chronic obstructive pulmonary disease: Secondary | ICD-10-CM | POA: Diagnosis present

## 2010-11-22 DIAGNOSIS — E119 Type 2 diabetes mellitus without complications: Secondary | ICD-10-CM | POA: Diagnosis present

## 2010-11-22 DIAGNOSIS — Z7901 Long term (current) use of anticoagulants: Secondary | ICD-10-CM

## 2010-11-22 DIAGNOSIS — Z9981 Dependence on supplemental oxygen: Secondary | ICD-10-CM

## 2010-11-22 DIAGNOSIS — K8020 Calculus of gallbladder without cholecystitis without obstruction: Principal | ICD-10-CM | POA: Diagnosis present

## 2010-11-22 LAB — COMPREHENSIVE METABOLIC PANEL
ALT: 19 U/L (ref 0–35)
CO2: 31 mEq/L (ref 19–32)
Calcium: 9.6 mg/dL (ref 8.4–10.5)
Chloride: 95 mEq/L — ABNORMAL LOW (ref 96–112)
GFR calc non Af Amer: 48 mL/min — ABNORMAL LOW (ref >60)
Glucose, Bld: 262 mg/dL — ABNORMAL HIGH (ref 70–99)
Sodium: 139 mEq/L (ref 135–145)
Total Bilirubin: 0.3 mg/dL (ref 0.3–1.2)

## 2010-11-22 LAB — GLUCOSE, CAPILLARY
Glucose-Capillary: 107 mg/dL — ABNORMAL HIGH (ref 70–99)
Glucose-Capillary: 93 mg/dL (ref 70–99)

## 2010-11-22 LAB — URINALYSIS, ROUTINE W REFLEX MICROSCOPIC
Bilirubin Urine: NEGATIVE
Glucose, UA: >1000 mg/dL — AB
Ketones, ur: NEGATIVE mg/dL
Protein, ur: >300 mg/dL — AB

## 2010-11-22 LAB — PROTIME-INR: Prothrombin Time: 22.5 seconds — ABNORMAL HIGH (ref 11.6–15.2)

## 2010-11-22 LAB — CBC
HCT: 39.9 % (ref 36.0–46.0)
Hemoglobin: 14.2 g/dL (ref 12.0–15.0)
MCH: 27.4 pg (ref 26.0–34.0)
RBC: 5.19 MIL/uL — ABNORMAL HIGH (ref 3.87–5.11)

## 2010-11-22 LAB — DIFFERENTIAL
Basophils Relative: 0 % (ref 0–1)
Lymphocytes Relative: 8 % — ABNORMAL LOW (ref 12–46)
Monocytes Relative: 3 % (ref 3–12)
Neutro Abs: 9.3 10*3/uL — ABNORMAL HIGH (ref 1.7–7.7)
Neutrophils Relative %: 89 % — ABNORMAL HIGH (ref 43–77)

## 2010-11-22 LAB — URINE MICROSCOPIC-ADD ON

## 2010-11-22 LAB — LIPASE, BLOOD: Lipase: 18 U/L (ref 11–59)

## 2010-11-22 MED ORDER — IOHEXOL 300 MG/ML  SOLN
125.0000 mL | Freq: Once | INTRAMUSCULAR | Status: AC | PRN
  Administered 2010-11-22: 125 mL via INTRAVENOUS

## 2010-11-22 NOTE — H&P (Signed)
NAME:  Alejandra [name], Alejandra [name] NO.:  [id number]  MEDICAL RECORD NO.:  [id number]           PATIENT TYPE:  E  LOCATION:  [facility]                         FACILITY:  [facility]  PHYSICIAN:  Lonia Blood, M.D.      DATE OF BIRTH:  03/20/1932  DATE OF ADMISSION:  11/22/2010 DATE OF DISCHARGE:                             HISTORY & PHYSICAL   PRIMARY CARE PHYSICIAN:  Lorelle Formosa, M.D.  PRESENTING COMPLAINT:  Abdominal pain, nausea and vomiting.  HISTORY OF PRESENT ILLNESS:  Patient is a 77 year old female with history of hypertension, diabetes and PE in July of last year, who presented with abdominal pain that started today.  She had two episodes of vomitus that she described as coffee-ground.  She has not had this kind of pain before and therefore was worried.  She came to the Emergency Room where she was evaluated.  Her pain was 7/10 in the onset. Centrally located.  No radiation.  She denied any significant aggravating factor, but relief with pain medicine here in the ED.  She has since been pain free.  She denied any melena.  No hematemesis.  No bright red blood per rectum.  The patient has no prior abdominal surgeries.  She still has her gallbladder in place.  PAST MEDICAL HISTORY:  Her past medical history is significant for: 1. COPD.  She is on home O2 at 2 liters. 2. Hypertension. 3. History of pulmonary embolism in July 2011.  She is still on     Coumadin. 4. Tobacco dependence. 5. Hyperlipidemia. 6. History of psoriasis. 7. Type 2 diabetes, non-insulin dependent.  ALLERGIES:  PENICILLIN.  CURRENT MEDICATIONS:  Include: 1. Coumadin at 5 mg, being adjusted oxygen at 2 L per minute. 2. Vitamin D3 one tablet daily. 3. Multivitamins one daily. 4. Tylenol 650 mg as needed. 5. Lovastatin 20 mg daily. 6. Glipizide 5 mg twice a day. 7. Triamterene hydrochlorothiazide 37.5/25 one tablet daily. 8. Metoprolol tartrate 50 mg p.o. b.i.d. 9. Metformin 500 mg twice a  day.  SOCIAL HISTORY:  Patient lives here in [name].  She still smokes about half a pack per day.  No alcohol.  No IV drug use.  She is a retired Ambulance person.  She lives with her grandson and is able to do all her ADLs without much assistance.  FAMILY HISTORY:  Significant mainly for hypertension and diabetes.  REVIEW OF SYSTEMS:  All systems reviewed and are negative except per HPI.  PHYSICAL EXAMINATION:  VITAL SIGNS:  Temperature is 98, blood pressure 155/107 with a pulse of 114, respiratory rate 21, sats 100% room air. GENERAL:  She is awake, alert, oriented, currently pain free.  She is in no acute distress. HEENT:  PERRL.  EOMI.  No pallor.  No jaundice.  No rhinorrhea. NECK:  Supple.  No JVD.  No lymphadenopathy. RESPIRATORY:  She has good air entry bilaterally.  No wheezing or crackles. CARDIOVASCULAR SYSTEM:  Patient has S1 and S2.  No audible murmur. ABDOMEN:  Soft, full.  No significant tenderness but has a large pulsatile mass in the infraumbilical region.  No organomegaly palpated. EXTREMITIES:  Showed no edema, cyanosis or clubbing. SKIN EXAMINATION:  No significant rashes or ulcers. MUSCULOSKELETAL:  No joint swelling or tenderness.  LABORATORY DATA:  White count is 10.4 with left shift ANC of 9.3.  Her hemoglobin 14.2 and platelet of 201,000.  PT is 22.5, INR 1.96 with a PTT of 29.  Sodium is 139, potassium 3.7, chloride 95, CO2 is 31, glucose 262, BUN 16, creatinine 1.11 with the GFR of 58.  The rest of the LFTs seems to be within normal.  Albumin 4.4 with calcium of 9.6. Her lipase is 18.  Fecal occult blood testing is positive.  Urinalysis showed cloudy urine with some glucosuria, moderate protein and proteinuria.  DIAGNOSTIC STUDIES:  Abdominal ultrasound showed gallbladder wall thickening with gallstones, sonographic Murphy's sign is however present, mildly dilated common bile duct without choledocholithiasis with 5 cm infrarenal abdominal aortic  aneurysm and hepatic steatosis. CT abdomen and pelvis showed diffuse gallbladder changes consisting of gallbladder wall thickening.  There is pericholecystic fluid and common bile duct dilatation findings consistent with acute cholecystitis and then an ultrasound was recommended.  She had an infrarenal abdominal aortic aneurysm that measures about 5.2 x 5.3 cm and some colonic diverticulosis.  ASSESSMENT:  This is a 77 year old female presenting with abdominal pain and findings consistent with cholelithiasis versus acute cholecystitis. She is not febrile, although she has a white count with some left shift, so cholecystitis is quite possible, but more than likely she may have just passed a gallstones.  Her abdominal pain when she had it may be secondary to the gallstone and now that had just passed the abdominal pain is much better.  Also, patient has a newly diagnosed infrarenal abdominal aortic aneurysm which will need evaluation and follow-up.  She is also having some guaiac-positive stool, but again she is on Coumadin and has diverticular disease.  She reported vomiting coffee ground hematemesis, but we need to watch her very much closely.  PLAN: 1. Cholelithiasis versus acute cholecystitis:  We will admit the     patient for observation.  Pain control as needed.  I will do the     fluid challenge for the patient tonight and tomorrow.  If her pain     returns with meals then we will stop the medication and she will     need surgical intervention at some point.  Meanwhile, I will     empirically start her on Cipro and Flagyl antibiotics until the     fluid challenge if she passes out and there is no evidence of     cholecystitis or any symptoms then we may just treat her for the     brief period of time to be on the safe side. 2. Abdominal aortic aneurysm:  This is a new diagnosis.  Patient will     needs referral to vascular care surgery for close follow-up. 3. Hypertension:  We  will resume her home medications.  Her blood     pressure is elevated today, probably because she did not take any     of her medications due to her nausea and vomiting. 4. History of PE:  She is on Coumadin.  Since patient is not scheduled     for any surgery at this point, I will continue with her Coumadin.     If, however, that becomes necessary, we will reverse her INR     accordingly and get her ready for any surgical intervention needed. 5. Tobacco dependence:  I would  recommend tobacco cessation counseling     and I will give her some nicotine patch. 6. Diabetes:  I will hold the metformin today, but put her on sliding     scale insulin.  I may continue with glipizide as needed. 7. COPD:  She is currently not wheezing even though she has oxygen at     home, we will continue with the oxygen and also empiric nebulizers     in the hospital. 8. Guaiac-positive stool:  More than likely secondary to her     diverticular disease and Coumadin.  Her hemoglobin is very much     stable.  We will follow her H and H closely.  If it starts falling     while in the hospital then we will have to consider GI consult and     maybe adjusting her and making decision on the Coumadin.  She had a     PE last year.  She has been in treatment for nearly 6 months.  This     is not clear if that was a triggered event or she has any genetic     defects.  If necessary, we may need to stop the Coumadin.     Lonia Blood, M.D.     Verlin Grills  D:  11/22/2010  T:  11/22/2010  Job:  [id number]  Electronically Signed by Lonia Blood M.D. on 11/22/2010 10:12:08 PM

## 2010-11-23 LAB — CBC
HCT: 35.8 % — ABNORMAL LOW (ref 36.0–46.0)
MCH: 26.8 pg (ref 26.0–34.0)
MCV: 76.8 fL — ABNORMAL LOW (ref 78.0–100.0)
Platelets: 184 10*3/uL (ref 150–400)
RDW: 15.4 % (ref 11.5–15.5)

## 2010-11-23 LAB — COMPREHENSIVE METABOLIC PANEL
ALT: 14 U/L (ref 0–35)
AST: 22 U/L (ref 0–37)
CO2: 32 mEq/L (ref 19–32)
Calcium: 8.6 mg/dL (ref 8.4–10.5)
Chloride: 96 mEq/L (ref 96–112)
Creatinine, Ser: 1.06 mg/dL (ref 0.4–1.2)
GFR calc non Af Amer: 50 mL/min — ABNORMAL LOW (ref >60)
Glucose, Bld: 107 mg/dL — ABNORMAL HIGH (ref 70–99)
Total Bilirubin: 0.4 mg/dL (ref 0.3–1.2)

## 2010-11-23 LAB — LIPID PANEL
Cholesterol: 184 mg/dL (ref 0–200)
LDL Cholesterol: 92 mg/dL (ref 0–99)
Total CHOL/HDL Ratio: 3.5 RATIO
VLDL: 39 mg/dL (ref 0–40)

## 2010-11-23 LAB — GLUCOSE, CAPILLARY
Glucose-Capillary: 180 mg/dL — ABNORMAL HIGH (ref 70–99)
Glucose-Capillary: 205 mg/dL — ABNORMAL HIGH (ref 70–99)
Glucose-Capillary: 148 mg/dL — ABNORMAL HIGH (ref 70–99)

## 2010-11-23 LAB — URINE CULTURE: Colony Count: 5000

## 2010-11-23 LAB — HEMOGLOBIN A1C: Mean Plasma Glucose: 177 mg/dL — ABNORMAL HIGH (ref <117)

## 2010-11-24 LAB — COMPREHENSIVE METABOLIC PANEL
AST: 19 U/L (ref 0–37)
Albumin: 2.9 g/dL — ABNORMAL LOW (ref 3.5–5.2)
BUN: 13 mg/dL (ref 6–23)
Creatinine, Ser: 1.11 mg/dL (ref 0.4–1.2)
GFR calc Af Amer: 58 mL/min — ABNORMAL LOW (ref >60)
Potassium: 3.5 mEq/L (ref 3.5–5.1)
Total Protein: 6.3 g/dL (ref 6.0–8.3)

## 2010-11-24 LAB — CBC
Platelets: 172 10*3/uL (ref 150–400)
RBC: 4.41 MIL/uL (ref 3.87–5.11)
RDW: 15.4 % (ref 11.5–15.5)
WBC: 4.9 10*3/uL (ref 4.0–10.5)

## 2010-11-24 LAB — GLUCOSE, CAPILLARY
Glucose-Capillary: 174 mg/dL — ABNORMAL HIGH (ref 70–99)
Glucose-Capillary: 169 mg/dL — ABNORMAL HIGH (ref 70–99)
Glucose-Capillary: 188 mg/dL — ABNORMAL HIGH (ref 70–99)

## 2010-11-24 LAB — PROTIME-INR: INR: 2.39 — ABNORMAL HIGH (ref 0.00–1.49)

## 2010-11-25 LAB — CBC
HCT: 33.6 % — ABNORMAL LOW (ref 36.0–46.0)
MCH: 26.7 pg (ref 26.0–34.0)
MCHC: 34.8 g/dL (ref 30.0–36.0)
MCV: 76.7 fL — ABNORMAL LOW (ref 78.0–100.0)
RDW: 15.4 % (ref 11.5–15.5)

## 2010-11-25 LAB — DIFFERENTIAL
Eosinophils Relative: 5 % (ref 0–5)
Lymphocytes Relative: 26 % (ref 12–46)
Lymphs Abs: 1.3 10*3/uL (ref 0.7–4.0)
Monocytes Absolute: 0.7 10*3/uL (ref 0.1–1.0)
Monocytes Relative: 12 % (ref 3–12)

## 2010-11-25 LAB — GLUCOSE, CAPILLARY: Glucose-Capillary: 148 mg/dL — ABNORMAL HIGH (ref 70–99)

## 2010-12-02 NOTE — Telephone Encounter (Signed)
Pt aware of results and to continue coumadin. PMD was advised as well.  Carron Curie, CMA

## 2010-12-08 NOTE — Discharge Summary (Signed)
[name], [name] NO.:  [id number]  MEDICAL RECORD NO.:  [id number]           PATIENT TYPE:  I  LOCATION:  1540                         FACILITY:  [facility]  PHYSICIAN:  Erick Blinks, MD     DATE OF BIRTH:  Sep 21, 1931  DATE OF ADMISSION:  11/22/2010 DATE OF DISCHARGE:                              DISCHARGE SUMMARY   PRIMARY CARE PHYSICIAN:  Lorelle Formosa, M.D.  DISCHARGE DIAGNOSES: 1. Acute abdominal pain, resolved, possibly secondary to     cholelithiasis. 2. Cholelithiasis without clinical evidence of cholecystitis. 3. Chronic respiratory failure secondary to chronic obstructive     pulmonary disease. 4. Oxygen-dependent chronic obstructive pulmonary disease on home O2     at 2 liters. 5. Hypertension. 6. History of pulmonary embolism in July 2011 on anticoagulation. 7. Heme-positive stools without any clinically significant bleeding,     for outpatient evaluation. 8. Non-insulin-dependent diabetes. 9. Hyperlipidemia. 10.Tobacco dependence. 11.Infrarenal abdominal aortic aneurysm, 5.2 x 5.3 cm, for outpatient     followup.  DISCHARGE MEDICATIONS: 1. Protonix 40 mg p.o. daily. 2. Aspirin 81 mg p.o. daily. 3. Glipizide 5 mg p.o. b.i.d. 4. Lovastatin 20 mg p.o. q.h.s. 5. Metoprolol 50 mg p.o. b.i.d. 6. Tylenol 650 mg 2 tablets p.o. q.a.m. 7. Coumadin 5 mg p.o. daily. 8. Multivitamins 1 tablet p.o. daily. 9. Metformin 500 mg p.o. b.i.d. 10.Vitamin D3 1000 units 1 capsule p.o. daily. 11.Triamterene/hydrochlorothiazide 37.5/25 mg 1 tablet p.o. daily. 12.Spiriva 18 mcg 1 tablet p.o. q.a.m.  ADMISSION HISTORY:  This is a 77 year old African-American female who presents to the emergency room with complaints of abdominal pain, nausea and vomiting.  The patient reports vomiting what she had eaten.  She denied any hematemesis.  The patient's abdominal pain was centrally located.  On arrival to the emergency room after receiving some  pain medication, her abdominal pain resolved.  She is found to have gallstones on her CT.  Therefore, she was referred for admission.  For further details, please refer to the history and physical dictated by Dr. Mikeal Hawthorne on May 6.  HOSPITAL COURSE: 1. Abdominal pain.  The patient was found to have thickening of her     gallbladder as well as a few gallstones.  Clinically, she did not     have a WBC count nor was she febrile.  Her pain resolved on     admission and she has not had any further pain since the past 3     days and she is tolerating a p.o. diet at this time and is not     having any further pain.  Her pain may have been secondary to     possible gallstone which had passed.  She will likely need a     general surgical evaluation for cholecystectomy at some point in     the future.  This was offered to her here in the hospital but she     does not want that done right now.  It is recommended that she sees     the surgeon as an outpatient. 2. Heme-positive stools.  The patient was found to  have heme-positive     stools.  She has a history of diverticulosis as well.  She denies     any frank hematemesis and she has been continued on Coumadin here     in the hospital.  She has also been started on a PPI.  Her     hemoglobin has remained stable while here in the hospital.  If any     further bleeding persists, then it is recommended that the patient     have gastroenterology evaluation, possibly done as an outpatient.     She has had a colonoscopy done before but cannot remember how long     ago it was done.  We will defer this to primary care physician, as     she is not having any clinical findings/symptoms of GI bleed at     this time. 3. Abdominal aortic aneurysm.  This is a new diagnosis found on CT.     She will need an outpatient vascular surgery evaluation for     surveillance. 4. COPD.  She is continued on home oxygen at this time. 5. History of PE.  She is continued on  Coumadin.  We will defer length     of therapy to her primary care physician.  DISPOSITION:  The patient is stable for discharge home.  She will follow up with her primary care physician in the next 1 week.  CONSULTATIONS:  None.  DIAGNOSTIC IMAGING: 1. CT of abdomen and pelvis on May 6 shows diffuse gallbladder changes     consistent with gallbladder wall thickening, pericholecystic fluid     and common bile duct dilatation.  Findings consistent with acute     cholecystitis, infrarenal abdominal aortic aneurysm, measuring     maximum 5.2 x 5.3 cm colonic diverticulosis. 2. Ultrasound of the abdomen on May 6 shows gallbladder wall     thickening with gallstone, sonographic Murphy sign is absent.     Correlate clinically in assessing for acute cholecystitis, mildly     dilated common bile duct but was not directly visualized.     Choledocholithiasis, 5-cm infrarenal abdominal aortic aneurysm,     hepatic steatosis.  DISCHARGE INSTRUCTIONS:  The patient continued on a low-salt, low-fat diet.  She should conduct her activity as tolerated.  She will need to see her primary care physician in the next 1 week.  She will also need to follow up with her Cou[facility] Clinic on Friday for repeat INR.  It has been recommended that she has an outpatient general surgical as well as GI consultation for her cholecystitis/heme-positive stools, respectively.  She will also need outpatient vascular surgery evaluation for surveillance of her abdominal aortic aneurysm.  The patient is otherwise stable for discharge.  CONDITION AT TIME OF DISCHARGE:  Improved.     Erick Blinks, MD     JM/MEDQ  D:  11/25/2010  T:  11/25/2010  Job:  [id number]  cc:   Lorelle Formosa, M.D. Fax: [phone]  Electronically Signed by Erick Blinks  on 12/08/2010 01:29:08 PM

## 2011-03-26 ENCOUNTER — Ambulatory Visit (INDEPENDENT_AMBULATORY_CARE_PROVIDER_SITE_OTHER)
Admission: RE | Admit: 2011-03-26 | Discharge: 2011-03-26 | Disposition: A | Discharge status: Home / Self Care | Payer: Medicare Other | Source: Ambulatory Visit | Attending: Internal Medicine | Admitting: Internal Medicine

## 2011-03-26 DIAGNOSIS — J984 Other disorders of lung: Secondary | ICD-10-CM

## 2011-03-26 DIAGNOSIS — R911 Solitary pulmonary nodule: Secondary | ICD-10-CM

## 2011-05-07 ENCOUNTER — Ambulatory Visit: Payer: Medicare Other

## 2011-05-07 ENCOUNTER — Ambulatory Visit (INDEPENDENT_AMBULATORY_CARE_PROVIDER_SITE_OTHER): Payer: Medicare Other | Admitting: Internal Medicine

## 2011-05-07 DIAGNOSIS — I749 Embolism and thrombosis of unspecified artery: Secondary | ICD-10-CM

## 2011-05-07 DIAGNOSIS — I2699 Other pulmonary embolism without acute cor pulmonale: Secondary | ICD-10-CM

## 2011-05-07 DIAGNOSIS — I8290 Acute embolism and thrombosis of unspecified vein: Secondary | ICD-10-CM

## 2011-05-07 DIAGNOSIS — J984 Other disorders of lung: Secondary | ICD-10-CM

## 2011-05-07 DIAGNOSIS — R109 Unspecified abdominal pain: Secondary | ICD-10-CM

## 2011-05-07 DIAGNOSIS — J449 Chronic obstructive pulmonary disease, unspecified: Secondary | ICD-10-CM

## 2011-05-07 DIAGNOSIS — R911 Solitary pulmonary nodule: Secondary | ICD-10-CM

## 2011-05-07 LAB — CBC WITH DIFFERENTIAL/PLATELET
Basophils Absolute: 0 K/uL (ref 0.0–0.1)
Basophils Relative: 0.4 % (ref 0.0–3.0)
Eosinophils Absolute: 0.2 K/uL (ref 0.0–0.7)
Eosinophils Relative: 3.1 % (ref 0.0–5.0)
HCT: 39.7 % (ref 36.0–46.0)
Hemoglobin: 13.2 g/dL (ref 12.0–15.0)
Lymphocytes Relative: 34.1 % (ref 12.0–46.0)
Lymphs Abs: 1.9 K/uL (ref 0.7–4.0)
MCHC: 33.2 g/dL (ref 30.0–36.0)
MCV: 83.3 fl (ref 78.0–100.0)
Monocytes Absolute: 0.5 K/uL (ref 0.1–1.0)
Monocytes Relative: 8.3 % (ref 3.0–12.0)
Neutro Abs: 3.1 K/uL (ref 1.4–7.7)
Neutrophils Relative %: 54.1 % (ref 43.0–77.0)
Platelets: 192 K/uL (ref 150.0–400.0)
RBC: 4.76 Mil/uL (ref 3.87–5.11)
RDW: 16 % — ABNORMAL HIGH (ref 11.5–14.6)
WBC: 5.7 K/uL (ref 4.5–10.5)

## 2011-05-07 LAB — PROTIME-INR
INR: 2.3 ratio — ABNORMAL HIGH (ref 0.8–1.0)
Prothrombin Time: 25.4 s — ABNORMAL HIGH (ref 10.2–12.4)

## 2011-05-07 LAB — APTT: aPTT: 32.9 s — ABNORMAL HIGH (ref 21.7–28.8)

## 2011-05-07 LAB — D-DIMER, QUANTITATIVE: D-Dimer, Quant: 0.52 ug{FEU}/mL — ABNORMAL HIGH (ref 0.00–0.48)

## 2011-05-07 MED ORDER — ALBUTEROL SULFATE (2.5 MG/3ML) 0.083% IN NEBU: 2.5000 mg | INHALATION_SOLUTION | Freq: Four times a day (QID) | RESPIRATORY_TRACT | Status: DC | PRN

## 2011-05-07 MED ORDER — IPRATROPIUM BROMIDE 0.02 % IN SOLN: 500.0000 ug | Freq: Four times a day (QID) | RESPIRATORY_TRACT | Status: DC

## 2011-05-07 NOTE — Progress Notes (Signed)
  Subjective:    Patient ID: Alejandra [name], female    DOB: 1931/09/20, 77 y.o.   MRN: [id number]  HPI 77 year old AA female with   1. Idiopathic Acute PE July 2011 - didimer high 0.69 in April  2012 and advised to continue coumadin for another 6 months - admitted May 2012: with heme positive stools. Was advised to continue coumadin by [facility] - last INR check 2.39 in May 2012 2. O2 dependent Gold stage 2 COPD (87% RA at rest, FEv1 64%, DLCO 36%) 3. 2 x 6 mm pulmonary nodules    - Feb 2012 and no change Sept 2012 4. Vit D Deficiency.  5. Admit abdominal pain:heme ppositive stools without bleeding May 2012 - hgb 11.7gm% 6. Contiue tobacco abuse  - half pack per day per hospital notes may 2012 - patient says she quit June 2011   OV 05/07/11: Followup for all of above. At last  In April 2012 we told her to continue coumadin through now because d-dimer was high. Currently states she is taking coumadin but last INR of record was in May 2012 when it was > 2. I cannot still get hold of her PMD whose office is open only after hours. Admitted in May 2012 for abd pain. Heme positive stools. Hgb 12gm%. No abd pain now. Not seen GI. Main issue is class  3 dyspnea and tiredness. Not gone to rehab; says they never called. Not taking spiriva or o2 as she is supposed to. OVerall stable. Noted: hospital records may 2012 she was still smoking but she insists she quit last year  Past, Family, Social: See HPI     Review of Systems  Constitutional: Negative for fever and unexpected weight change.  HENT: Negative for ear pain, nosebleeds, congestion, sore throat, rhinorrhea, sneezing, trouble swallowing, dental problem, postnasal drip and sinus pressure.   Eyes: Negative for redness and itching.  Respiratory: Positive for cough and shortness of breath. Negative for chest tightness and wheezing.   Cardiovascular: Negative for palpitations and leg swelling.  Gastrointestinal: Negative for nausea and  vomiting.  Genitourinary: Negative for dysuria.  Musculoskeletal: Negative for joint swelling.  Skin: Negative for rash.  Neurological: Negative for headaches.  Hematological: Does not bruise/bleed easily.  Psychiatric/Behavioral: Negative for dysphoric mood. The patient is not nervous/anxious.        Objective:   Physical Exam normocephalic and atraumatic Eyes:  PERRLA/EOM intact; conjunctiva and sclera clear Ears:  TMs intact and clear with normal canals Nose:  no deformity, discharge, inflammation, or lesions Mouth:  no deformity or lesions Neck:  no masses, thyromegaly, or abnormal cervical nodes Chest Wall:  no deformities noted Lungs:  clear bilaterally to auscultation and percussion Heart:  regular rate and rhythm, S1, S2 without murmurs, rubs, gallops, or clicks Abdomen:  bowel sounds positive; abdomen soft and non-tender without masses, or organomegaly Msk:  no deformity or scoliosis noted with normal posture Pulses:  pulses normal Extremities:  no clubbing, cyanosis, edema, or deformity noted Neurologic:  CN II-XII grossly intact with normal reflexes, coordination, muscle strength and tone Skin:  intact without lesions or rashes Cervical Nodes:  no significant adenopathy Axillary Nodes:  no significant adenopathy Psych:  alert and cooperative; normal mood and affect; normal attention span and concentration          Assessment & Plan:

## 2011-05-07 NOTE — Patient Instructions (Addendum)
#  blood clot  - please do d-dimer and pt, ptt blod test today  - based on this will tell you if you can stop your coumadin or not  -I need you to do tehse tests here because we simply cannot get hold of Dr Ronne Binning #recent abdominal pain  - do blood test for CBC  #COPD  - you need to wear oxygen all the time - I am surprised that rehab did not follow through with you; will make another referral  - becasuse spiriva is expensive, take atrovent nebs 4 times daily scheduled, and use albuterol neb as needed #Lung nodule  - stable over 6 months; next CT chest without contrast in 9 months #Followup  - return in 9 months after CT chest

## 2011-05-09 ENCOUNTER — Encounter: Payer: Self-pay | Admitting: Internal Medicine

## 2011-05-09 NOTE — Assessment & Plan Note (Signed)
#  Lung nodule  - stable over 6 months; next CT chest without contrast in 9 months #Followup  - return in 9 months after CT chest

## 2011-05-09 NOTE — Assessment & Plan Note (Signed)
#  COPD  - you need to wear oxygen all the time - I am surprised that rehab did not follow through with you; will make another referral  - becasuse spiriva is expensive, take atrovent nebs 4 times daily scheduled, and use albuterol neb as needed

## 2011-05-09 NOTE — Assessment & Plan Note (Signed)
#  blood clot  - please do d-dimer and pt, ptt blod test today  - based on this will tell you if you can stop your coumadin or not  -I need you to do tehse tests here because we simply cannot get hold of Dr Ronne Binning #recent abdominal pain  - do blood test for CBC

## 2011-06-17 ENCOUNTER — Encounter (HOSPITAL_COMMUNITY): Payer: Self-pay

## 2011-06-17 ENCOUNTER — Encounter (HOSPITAL_COMMUNITY)
Admission: RE | Admit: 2011-06-17 | Discharge: 2011-06-17 | Disposition: A | Discharge status: Home / Self Care | Payer: Medicare Other | Source: Ambulatory Visit | Attending: Internal Medicine | Admitting: Internal Medicine

## 2011-06-17 HISTORY — DX: Hyperlipidemia, unspecified: E78.5

## 2011-06-17 NOTE — Progress Notes (Signed)
Demonstration and practice of PLB using pulse oximeter.  Patient able to return demonstration satisfactorily. Safety and hand hygiene in the exercise area reviewed with patient.  Patient voices understanding.

## 2011-06-22 ENCOUNTER — Encounter (HOSPITAL_COMMUNITY)
Admission: RE | Admit: 2011-06-22 | Discharge: 2011-06-22 | Disposition: A | Discharge status: Home / Self Care | Payer: Medicare Other | Source: Ambulatory Visit | Attending: Internal Medicine | Admitting: Internal Medicine

## 2011-06-22 DIAGNOSIS — J4489 Other specified chronic obstructive pulmonary disease: Secondary | ICD-10-CM | POA: Insufficient documentation

## 2011-06-22 DIAGNOSIS — Z5189 Encounter for other specified aftercare: Secondary | ICD-10-CM | POA: Insufficient documentation

## 2011-06-22 DIAGNOSIS — Z86711 Personal history of pulmonary embolism: Secondary | ICD-10-CM | POA: Insufficient documentation

## 2011-06-22 DIAGNOSIS — J449 Chronic obstructive pulmonary disease, unspecified: Secondary | ICD-10-CM | POA: Insufficient documentation

## 2011-06-22 NOTE — Progress Notes (Signed)
Patients first day of exercise. Oriented to equipment and hand hygeine. Patient tolerated well on all exercises. VSS. Will continue to encourage and support.

## 2011-06-24 ENCOUNTER — Encounter (HOSPITAL_COMMUNITY)
Admission: RE | Admit: 2011-06-24 | Discharge: 2011-06-24 | Disposition: A | Discharge status: Home / Self Care | Payer: Medicare Other | Source: Ambulatory Visit | Attending: Internal Medicine | Admitting: Internal Medicine

## 2011-06-24 NOTE — Progress Notes (Signed)
Pulmonary Rehab Nutrition Screen  Alejandra [name] 77 y.o. female             Ht:66" Ht Readings from Last 1 Encounters:  05/07/11 5\' 5"  (1.651 m)    Wt:  185.5 (84.3 kg ) Wt Readings from Last 3 Encounters:  05/07/11 184 lb 3.2 oz (83.553 kg)  11/06/10 184 lb 3.2 oz (83.553 kg)  09/21/10 184 lb (83.462 kg)    BMI 30.0 %body fat 41.3        Rate Your Plate Score: 39  Please answer the following questions:             YES  NO    Do you live in a nursing home?  X   Do you eat out more than 3 times per week?    X If yes, how many times per week do you eat out?   Do you have food allergies?   X If yes, what are you allergic to?  Have you gained or lost more than 10 lbs without trying?              X  If yes, how much weight have you  lost or gained? 30 lbs over "since dx with DM"  Do you want to lose weight?    X  If yes, what is a goal weight or amount of weight you would like to lose? 30 lb  Do you eat alone most of the time?  X    Do you eat less than 2 meals/day?  X If yes, how many meals do you eat? Some days 2  Do you use canned and convenience food?  X   Do you use a salt shaker?  X   Do you drink more than 3 alcoholic drinks/day?  X If yes, how many drinks per day?  Are you having trouble with constipation? *  X If yes, what are you doing to help relieve constipation?  Do you have financial difficulties with buying food? *  X   Do you usually need help with grocery shopping or with cooking? *  X   Do you have a poor appetite? *                                       X   Do you have trouble chewing/ swallowing? *  X  Due to dentures ill-fitting per pt  Do you take vitamin and mineral or herbal supplements? * X  If yes, what kind of supplements do you currently take? Vitamin D

## 2011-06-29 ENCOUNTER — Encounter (HOSPITAL_COMMUNITY)
Admission: RE | Admit: 2011-06-29 | Discharge: 2011-06-29 | Disposition: A | Discharge status: Home / Self Care | Payer: Medicare Other | Source: Ambulatory Visit | Attending: Internal Medicine | Admitting: Internal Medicine

## 2011-07-01 ENCOUNTER — Encounter (HOSPITAL_COMMUNITY)
Admission: RE | Admit: 2011-07-01 | Discharge: 2011-07-01 | Disposition: A | Discharge status: Home / Self Care | Payer: Medicare Other | Source: Ambulatory Visit | Attending: Internal Medicine | Admitting: Internal Medicine

## 2011-07-01 NOTE — Progress Notes (Signed)
Reviewed home exercise with pt today.  Pt plans to walk daily and do resistance bands for exercise.  Reviewed Dyspnea, RPE,  How important it is to own a pulse oximeter and how to use one, sign and symptoms, NTG use, and when to call 911 or MD.  Pt voiced understanding.  

## 2011-07-01 NOTE — Progress Notes (Signed)
Nutrition Assessment Alejandra [name]  Time Spent: 15 min   77 y.o. female with COPD Past Medical History  Diagnosis Date  . Pulmonary embolism   . COPD (chronic obstructive pulmonary disease)   . DM (diabetes mellitus)   . Dyslipidemia   . Hypertension   . Hyperlipidemia    Current tobacco use? No    Meds: vitamin D3, Glipizide, Metformin, MVI, Maxzide, Warfarin Labs:  Lab Results  Component Value Date   HGBA1C  Value: 7.8 (NOTE)                                                                       According to the ADA Clinical Practice Recommendations for 2011, when HbA1c is used as a screening test:   >=6.5%   Diagnostic of Diabetes Mellitus           (if abnormal result  is confirmed)  5.7-6.4%   Increased risk of developing Diabetes Mellitus  References:Diagnosis and Classification of Diabetes Mellitus,Diabetes Care,2011,34(Suppl 1):S62-S69 and Standards of Medical Care in         Diabetes - 2011,Diabetes Care,2011,34  (Suppl 1):S11-S61.* 11/23/2010   HT 66" Ht Readings from Last 1 Encounters:  05/07/11 5\' 5"  (1.651 m)  WT   185.5 lb (84.3 kg) Wt Readings from Last 3 Encounters:  05/07/11 184 lb 3.2 oz (83.553 kg)  11/06/10 184 lb 3.2 oz (83.553 kg)  09/21/10 184 lb (83.462 kg)  IBW 59.1   143%IBW  BMI 30   %body fat 41.3 Assessment: Pt is obese.  According to pt, she gained 30 lb after being dx with DM.  Pt eats 2 meals and several snacks daily.  There are some ways pt can change her diet to become healthier.  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  Pt has a 1 year old grandson in college that lives with pt when he is not in school.  Pt reports her 42 year old grandson is a "picky eater" and per discussion, pt caters to the 6 year olds food choices. Pt states that she buys things for her grandson before she gets things for herself because "money is tight."  This Clinical research associate spoke with Brock Ra, EP, and Cathie Olden, RN re: pt.  Pt told  Brock Ra, EP, that she could not even afford a $15 pulse oximeter. Pt's Rate Your Plate results reviewed with pt.  Pt states, "I know what to do it's a matter of doing it."  Pt avoids many salty foods; does not use canned/ convenience food.  Pt does not add salt to food.  The role of sodium in lung disease reviewed with pt.  Pt is diabetic.  Last A1c indicates blood glucose not well controlled.  Pre-exercise CBG's have been 211 -216 mg/dL and post-exercise CBG's have been 80 - 101 mg/dL; half of CBG's above desired goal.  CBG's trending down 115-131 points with exercise. Nutrition Diagnosis   Food-and nutrition-related knowledge deficit related to lack of exposure to information as related to diagnosis of pulmonary disease and diabetes   Obesity related to excessive energy intake as evidenced by a BMI of 30 Nutrition Rx/Est. Daily Nutrition Needs for: ? wt loss 1300-1500  Kcal  70-80 gm protein   1500-2000 mg or less sodium     175 gm CHO Nutrition Intervention   Pt's individual nutrition plan and goals reviewed with pt.   Benefits of adopting healthy eating habits discussed when pt's Rate Your Plate reviewed.   Pt to attend the Nutrition and Lung Disease class and Diabetes Blitz class   Continual client-centered nutrition education by RD, as part of interdisciplinary care. Goal(s) 1. Identify food quantities necessary to achieve wt loss of  -2# per week to a goal wt of 73.2-78.6 kg (161-173 #) at graduation from pulmonary rehab. 2. Describe the benefit of including fruits, vegetables, whole grains, and low-fat dairy products in a healthy meal plan. 3. Use pre-/post meal and/or exercise CBG's and A1c to determine whether adjustments in food/meal planning will be beneficial or if any meds need to be combined with nutrition therapy. Monitor and Evaluate progress toward nutrition goal with team.

## 2011-07-06 ENCOUNTER — Encounter (HOSPITAL_COMMUNITY)
Admission: RE | Admit: 2011-07-06 | Discharge: 2011-07-06 | Disposition: A | Discharge status: Home / Self Care | Payer: Medicare Other | Source: Ambulatory Visit | Attending: Internal Medicine | Admitting: Internal Medicine

## 2011-07-08 ENCOUNTER — Encounter (HOSPITAL_COMMUNITY)
Admission: RE | Admit: 2011-07-08 | Discharge: 2011-07-08 | Disposition: A | Discharge status: Home / Self Care | Payer: Medicare Other | Source: Ambulatory Visit | Attending: Internal Medicine | Admitting: Internal Medicine

## 2011-07-13 ENCOUNTER — Encounter (HOSPITAL_COMMUNITY): Payer: Medicare Other

## 2011-07-15 ENCOUNTER — Encounter (HOSPITAL_COMMUNITY): Payer: Medicare Other

## 2011-07-20 ENCOUNTER — Encounter (HOSPITAL_COMMUNITY): Payer: Medicare Other

## 2011-07-20 DIAGNOSIS — Z86711 Personal history of pulmonary embolism: Secondary | ICD-10-CM | POA: Insufficient documentation

## 2011-07-20 DIAGNOSIS — J4489 Other specified chronic obstructive pulmonary disease: Secondary | ICD-10-CM | POA: Insufficient documentation

## 2011-07-20 DIAGNOSIS — Z5189 Encounter for other specified aftercare: Secondary | ICD-10-CM | POA: Insufficient documentation

## 2011-07-20 DIAGNOSIS — L089 Local infection of the skin and subcutaneous tissue, unspecified: Secondary | ICD-10-CM

## 2011-07-20 DIAGNOSIS — J449 Chronic obstructive pulmonary disease, unspecified: Secondary | ICD-10-CM | POA: Insufficient documentation

## 2011-07-20 HISTORY — DX: Local infection of the skin and subcutaneous tissue, unspecified: L08.9

## 2011-07-22 ENCOUNTER — Encounter (HOSPITAL_COMMUNITY)
Admission: RE | Admit: 2011-07-22 | Discharge: 2011-07-22 | Disposition: A | Discharge status: Home / Self Care | Payer: Medicare Other | Source: Ambulatory Visit | Attending: Internal Medicine | Admitting: Internal Medicine

## 2011-07-27 ENCOUNTER — Encounter (HOSPITAL_COMMUNITY): Payer: Medicare Other

## 2011-07-29 ENCOUNTER — Encounter (HOSPITAL_COMMUNITY)
Admission: RE | Admit: 2011-07-29 | Discharge: 2011-07-29 | Disposition: A | Discharge status: Home / Self Care | Payer: Medicare Other | Source: Ambulatory Visit | Attending: Internal Medicine | Admitting: Internal Medicine

## 2011-08-03 ENCOUNTER — Encounter (HOSPITAL_COMMUNITY)
Admission: RE | Admit: 2011-08-03 | Discharge: 2011-08-03 | Disposition: A | Discharge status: Home / Self Care | Payer: Medicare Other | Source: Ambulatory Visit | Attending: Internal Medicine | Admitting: Internal Medicine

## 2011-08-03 LAB — GLUCOSE, CAPILLARY: Glucose-Capillary: 106 mg/dL — ABNORMAL HIGH (ref 70–99)

## 2011-08-05 ENCOUNTER — Encounter (HOSPITAL_COMMUNITY)
Admission: RE | Admit: 2011-08-05 | Discharge: 2011-08-05 | Disposition: A | Discharge status: Home / Self Care | Payer: Medicare Other | Source: Ambulatory Visit | Attending: Internal Medicine | Admitting: Internal Medicine

## 2011-08-10 ENCOUNTER — Encounter (HOSPITAL_COMMUNITY)
Admission: RE | Admit: 2011-08-10 | Discharge: 2011-08-10 | Disposition: A | Discharge status: Home / Self Care | Payer: Medicare Other | Source: Ambulatory Visit | Attending: Internal Medicine | Admitting: Internal Medicine

## 2011-08-10 LAB — GLUCOSE, CAPILLARY
Glucose-Capillary: 72 mg/dL (ref 70–99)
Glucose-Capillary: 78 mg/dL (ref 70–99)

## 2011-08-11 NOTE — Progress Notes (Signed)
Nutrition Consult for CBG's dropping significantly during exercise.  Pt CBG's dropping significantly during exercise so pt has to have a post-exercise snack. Pt eating carbs, protein, and fat for breakfast.  Suspect am Glimepiride contributing to significant CBG decreases.  Spoke with pt re: trial of holding am Glimepiride Wednesday 1/23 and Thursday 1/24 and seeing how CBG's did during exercise on Thursday.  Pt agreeable to trial at this time.  Will monitor CBG's with team and notify MD if exercise CBG's stabilize with holding am Glimepiride.  Pt's last A1c was 7.8, which was 8.5 months ago.  Suspect A1c may be decreased, given pt started an exercise program. Continue client-centered nutrition education by RD as part of interdisciplinary care.  Monitor and evaluate progress toward nutrition goal with team.

## 2011-08-12 ENCOUNTER — Encounter (HOSPITAL_COMMUNITY): Payer: Medicare Other

## 2011-08-17 ENCOUNTER — Encounter (HOSPITAL_COMMUNITY)
Admission: RE | Admit: 2011-08-17 | Discharge: 2011-08-17 | Disposition: A | Discharge status: Home / Self Care | Payer: Medicare Other | Source: Ambulatory Visit | Attending: Internal Medicine | Admitting: Internal Medicine

## 2011-08-19 ENCOUNTER — Encounter (HOSPITAL_COMMUNITY)
Admission: RE | Admit: 2011-08-19 | Discharge: 2011-08-19 | Disposition: A | Discharge status: Home / Self Care | Payer: Medicare Other | Source: Ambulatory Visit | Attending: Internal Medicine | Admitting: Internal Medicine

## 2011-08-24 ENCOUNTER — Encounter (HOSPITAL_COMMUNITY)
Admission: RE | Admit: 2011-08-24 | Discharge: 2011-08-24 | Disposition: A | Discharge status: Home / Self Care | Payer: Medicare Other | Source: Ambulatory Visit | Attending: Internal Medicine | Admitting: Internal Medicine

## 2011-08-24 DIAGNOSIS — Z86711 Personal history of pulmonary embolism: Secondary | ICD-10-CM | POA: Insufficient documentation

## 2011-08-24 DIAGNOSIS — Z5189 Encounter for other specified aftercare: Secondary | ICD-10-CM | POA: Insufficient documentation

## 2011-08-24 DIAGNOSIS — J449 Chronic obstructive pulmonary disease, unspecified: Secondary | ICD-10-CM | POA: Insufficient documentation

## 2011-08-24 DIAGNOSIS — J4489 Other specified chronic obstructive pulmonary disease: Secondary | ICD-10-CM | POA: Insufficient documentation

## 2011-08-26 ENCOUNTER — Encounter (HOSPITAL_COMMUNITY)
Admission: RE | Admit: 2011-08-26 | Discharge: 2011-08-26 | Disposition: A | Discharge status: Home / Self Care | Payer: Medicare Other | Source: Ambulatory Visit | Attending: Internal Medicine | Admitting: Internal Medicine

## 2011-08-31 ENCOUNTER — Encounter (HOSPITAL_COMMUNITY)
Admission: RE | Admit: 2011-08-31 | Discharge: 2011-08-31 | Disposition: A | Discharge status: Home / Self Care | Payer: Medicare Other | Source: Ambulatory Visit | Attending: Internal Medicine | Admitting: Internal Medicine

## 2011-09-02 ENCOUNTER — Encounter (HOSPITAL_COMMUNITY)
Admission: RE | Admit: 2011-09-02 | Discharge: 2011-09-02 | Disposition: A | Discharge status: Home / Self Care | Payer: Medicare Other | Source: Ambulatory Visit | Attending: Internal Medicine | Admitting: Internal Medicine

## 2011-09-07 ENCOUNTER — Encounter (HOSPITAL_COMMUNITY)
Admission: RE | Admit: 2011-09-07 | Discharge: 2011-09-07 | Disposition: A | Discharge status: Home / Self Care | Payer: Medicare Other | Source: Ambulatory Visit | Attending: Internal Medicine | Admitting: Internal Medicine

## 2011-09-09 ENCOUNTER — Encounter (HOSPITAL_COMMUNITY)
Admission: RE | Admit: 2011-09-09 | Discharge: 2011-09-09 | Disposition: A | Discharge status: Home / Self Care | Payer: Medicare Other | Source: Ambulatory Visit | Attending: Internal Medicine | Admitting: Internal Medicine

## 2011-09-14 ENCOUNTER — Encounter (HOSPITAL_COMMUNITY)
Admission: RE | Admit: 2011-09-14 | Discharge: 2011-09-14 | Disposition: A | Discharge status: Home / Self Care | Payer: Medicare Other | Source: Ambulatory Visit | Attending: Internal Medicine | Admitting: Internal Medicine

## 2011-09-16 ENCOUNTER — Encounter (HOSPITAL_COMMUNITY): Payer: Medicare Other

## 2011-09-21 ENCOUNTER — Encounter (HOSPITAL_COMMUNITY)
Admission: RE | Admit: 2011-09-21 | Discharge: 2011-09-21 | Disposition: A | Discharge status: Home / Self Care | Payer: Medicare Other | Source: Ambulatory Visit | Attending: Internal Medicine | Admitting: Internal Medicine

## 2011-09-21 DIAGNOSIS — J4489 Other specified chronic obstructive pulmonary disease: Secondary | ICD-10-CM | POA: Insufficient documentation

## 2011-09-21 DIAGNOSIS — J449 Chronic obstructive pulmonary disease, unspecified: Secondary | ICD-10-CM | POA: Insufficient documentation

## 2011-09-21 DIAGNOSIS — Z86711 Personal history of pulmonary embolism: Secondary | ICD-10-CM | POA: Insufficient documentation

## 2011-09-21 DIAGNOSIS — Z5189 Encounter for other specified aftercare: Secondary | ICD-10-CM | POA: Insufficient documentation

## 2011-09-23 ENCOUNTER — Encounter (HOSPITAL_COMMUNITY)
Admission: RE | Admit: 2011-09-23 | Discharge: 2011-09-23 | Disposition: A | Discharge status: Home / Self Care | Payer: Medicare Other | Source: Ambulatory Visit | Attending: Internal Medicine | Admitting: Internal Medicine

## 2011-09-28 ENCOUNTER — Encounter (HOSPITAL_COMMUNITY): Payer: Medicare Other

## 2011-09-30 ENCOUNTER — Encounter (HOSPITAL_COMMUNITY)
Admission: RE | Admit: 2011-09-30 | Discharge: 2011-09-30 | Disposition: A | Discharge status: Home / Self Care | Payer: Medicare Other | Source: Ambulatory Visit | Attending: Internal Medicine | Admitting: Internal Medicine

## 2011-09-30 NOTE — Progress Notes (Signed)
Pulmonary Rehabilitation Program Progress Report   Orientation:  06/17/2011 Graduate Date:  09/30/2011 Discharge Date:  09/30/2011 # of sessions completed: 23  Pulmonologist: Ramaswamy Class Time:  10:30am  A.  Exercise Program:  Tolerates exercise @ 3.16 METS for 45 minutes, Walk Test Results:  Pre: 877 and Post: 1060ft, Improved functional capacity  24.29 %, Improved  muscular strength  6.67 %, Improved dyspnea score 25.00 %, Improved education score 1.00 %, Exercise limited by dyspnea and Patient plans to continue pulmonary rehab maintenance program  B.  Mental Health:  Good mental attitude and Quality of Life (QOL)  changes:  Overall  -2.59 %, Health/Functioning 6.77 %, Socioeconomics -26.84 %, Psych/Spiritual -10.59 %, Family 10.03 %    C.  Education/Instruction/Skills  Attended 11 education classes  Home exercise given: 07/01/2011  D.  Nutrition/Weight Control/Body Composition:  Patient has lost -0.3 kg  *This section completed by Mickle Plumb, Andres Shad, RD, LDN, CDE  E.  Blood Lipids    Lab Results  Component Value Date   CHOL 184 11/23/2010     Lab Results  Component Value Date   TRIG 195* 11/23/2010     Lab Results  Component Value Date   HDL 53 11/23/2010     Lab Results  Component Value Date   CHOLHDL 3.5 11/23/2010     No results found for this basename: LDLDIRECT      F.  Lifestyle Changes:  Making positive lifestyle changes  G.  Symptoms noted with exercise:  Asymptomatic  Report Completed By:  Ruffin Frederick

## 2011-10-05 ENCOUNTER — Encounter (HOSPITAL_COMMUNITY): Payer: Medicare Other

## 2011-10-07 ENCOUNTER — Encounter (HOSPITAL_COMMUNITY): Payer: Medicare Other

## 2011-10-11 NOTE — Progress Notes (Signed)
Addendum to Nutrition Section of Pulmonary Rehab Program Progress Report  Pt understands DM diet and does not always follow diet due to financial restrictions/ caregiver to grandchildren. Pt SMBG fair.

## 2011-10-29 ENCOUNTER — Inpatient Hospital Stay (HOSPITAL_COMMUNITY)
Admission: EM | Admit: 2011-10-29 | Discharge: 2011-11-01 | DRG: 238 | Disposition: A | Discharge status: Home / Self Care | Payer: Medicare Other | Attending: Internal Medicine | Admitting: Internal Medicine

## 2011-10-29 ENCOUNTER — Encounter (HOSPITAL_COMMUNITY): Payer: Self-pay | Admitting: *Deleted

## 2011-10-29 ENCOUNTER — Emergency Department (HOSPITAL_COMMUNITY): Payer: Medicare Other

## 2011-10-29 DIAGNOSIS — Z7982 Long term (current) use of aspirin: Secondary | ICD-10-CM

## 2011-10-29 DIAGNOSIS — E785 Hyperlipidemia, unspecified: Secondary | ICD-10-CM

## 2011-10-29 DIAGNOSIS — R1013 Epigastric pain: Secondary | ICD-10-CM

## 2011-10-29 DIAGNOSIS — J4489 Other specified chronic obstructive pulmonary disease: Secondary | ICD-10-CM | POA: Diagnosis present

## 2011-10-29 DIAGNOSIS — Z7901 Long term (current) use of anticoagulants: Secondary | ICD-10-CM

## 2011-10-29 DIAGNOSIS — I714 Abdominal aortic aneurysm, without rupture, unspecified: Principal | ICD-10-CM | POA: Diagnosis present

## 2011-10-29 DIAGNOSIS — R109 Unspecified abdominal pain: Secondary | ICD-10-CM | POA: Diagnosis present

## 2011-10-29 DIAGNOSIS — J984 Other disorders of lung: Secondary | ICD-10-CM

## 2011-10-29 DIAGNOSIS — I10 Essential (primary) hypertension: Secondary | ICD-10-CM | POA: Diagnosis present

## 2011-10-29 DIAGNOSIS — Z8249 Family history of ischemic heart disease and other diseases of the circulatory system: Secondary | ICD-10-CM

## 2011-10-29 DIAGNOSIS — J449 Chronic obstructive pulmonary disease, unspecified: Secondary | ICD-10-CM | POA: Diagnosis present

## 2011-10-29 DIAGNOSIS — I719 Aortic aneurysm of unspecified site, without rupture: Secondary | ICD-10-CM

## 2011-10-29 DIAGNOSIS — Z86711 Personal history of pulmonary embolism: Secondary | ICD-10-CM

## 2011-10-29 DIAGNOSIS — E1169 Type 2 diabetes mellitus with other specified complication: Secondary | ICD-10-CM | POA: Diagnosis present

## 2011-10-29 DIAGNOSIS — J189 Pneumonia, unspecified organism: Secondary | ICD-10-CM

## 2011-10-29 DIAGNOSIS — Z87891 Personal history of nicotine dependence: Secondary | ICD-10-CM

## 2011-10-29 DIAGNOSIS — E119 Type 2 diabetes mellitus without complications: Secondary | ICD-10-CM | POA: Diagnosis present

## 2011-10-29 DIAGNOSIS — I2699 Other pulmonary embolism without acute cor pulmonale: Secondary | ICD-10-CM

## 2011-10-29 DIAGNOSIS — Z79899 Other long term (current) drug therapy: Secondary | ICD-10-CM

## 2011-10-29 DIAGNOSIS — I152 Hypertension secondary to endocrine disorders: Secondary | ICD-10-CM | POA: Diagnosis present

## 2011-10-29 DIAGNOSIS — E559 Vitamin D deficiency, unspecified: Secondary | ICD-10-CM

## 2011-10-29 DIAGNOSIS — Z880 Allergy status to penicillin: Secondary | ICD-10-CM

## 2011-10-29 DIAGNOSIS — E1159 Type 2 diabetes mellitus with other circulatory complications: Secondary | ICD-10-CM | POA: Diagnosis present

## 2011-10-29 LAB — DIFFERENTIAL
Basophils Relative: 0 % (ref 0–1)
Eosinophils Absolute: 0 10*3/uL (ref 0.0–0.7)
Monocytes Absolute: 0.8 10*3/uL (ref 0.1–1.0)
Monocytes Relative: 10 % (ref 3–12)

## 2011-10-29 LAB — COMPREHENSIVE METABOLIC PANEL
Albumin: 4.2 g/dL (ref 3.5–5.2)
BUN: 21 mg/dL (ref 6–23)
Creatinine, Ser: 1 mg/dL (ref 0.50–1.10)
GFR calc Af Amer: 61 mL/min — ABNORMAL LOW (ref >90)
Total Bilirubin: 0.5 mg/dL (ref 0.3–1.2)
Total Protein: 8.1 g/dL (ref 6.0–8.3)

## 2011-10-29 LAB — PROTIME-INR
INR: 1.42 (ref 0.00–1.49)
Prothrombin Time: 17.6 seconds — ABNORMAL HIGH (ref 11.6–15.2)

## 2011-10-29 LAB — URINALYSIS, ROUTINE W REFLEX MICROSCOPIC
Ketones, ur: NEGATIVE mg/dL
Protein, ur: >300 mg/dL — AB
Urobilinogen, UA: 0.2 mg/dL (ref 0.0–1.0)

## 2011-10-29 LAB — URINE MICROSCOPIC-ADD ON

## 2011-10-29 LAB — CBC
HCT: 37.2 % (ref 36.0–46.0)
Hemoglobin: 13.6 g/dL (ref 12.0–15.0)
MCH: 27.9 pg (ref 26.0–34.0)
MCHC: 36.6 g/dL — ABNORMAL HIGH (ref 30.0–36.0)
MCV: 76.2 fL — ABNORMAL LOW (ref 78.0–100.0)

## 2011-10-29 LAB — PREPARE RBC (CROSSMATCH)

## 2011-10-29 MED ORDER — HYDROMORPHONE HCL PF 1 MG/ML IJ SOLN
1.0000 mg | Freq: Once | INTRAMUSCULAR | Status: AC
  Administered 2011-10-29: 1 mg via INTRAVENOUS
  Filled 2011-10-29: qty 1

## 2011-10-29 MED ORDER — PHYTONADIONE 5 MG PO TABS
5.0000 mg | ORAL_TABLET | Freq: Once | ORAL | Status: AC
  Administered 2011-10-29: 5 mg via ORAL
  Filled 2011-10-29: qty 1

## 2011-10-29 MED ORDER — VITAMIN D3 25 MCG (1000 UNIT) PO TABS
1000.0000 [IU] | ORAL_TABLET | Freq: Every day | ORAL | Status: DC
  Administered 2011-10-31 – 2011-11-01 (×2): 1000 [IU] via ORAL
  Filled 2011-10-29 (×3): qty 1

## 2011-10-29 MED ORDER — SODIUM CHLORIDE 0.9 % IV SOLN
INTRAVENOUS | Status: DC
  Administered 2011-10-29: 22:00:00 via INTRAVENOUS

## 2011-10-29 MED ORDER — ACETAMINOPHEN 325 MG PO TABS
650.0000 mg | ORAL_TABLET | Freq: Four times a day (QID) | ORAL | Status: DC | PRN
  Administered 2011-10-31: 650 mg via ORAL
  Filled 2011-10-29 (×3): qty 2

## 2011-10-29 MED ORDER — TIOTROPIUM BROMIDE MONOHYDRATE 18 MCG IN CAPS
18.0000 ug | ORAL_CAPSULE | Freq: Every day | RESPIRATORY_TRACT | Status: DC
  Administered 2011-10-31 – 2011-11-01 (×2): 18 ug via RESPIRATORY_TRACT
  Filled 2011-10-29: qty 5

## 2011-10-29 MED ORDER — ALBUTEROL SULFATE (5 MG/ML) 0.5% IN NEBU: 2.5000 mg | INHALATION_SOLUTION | RESPIRATORY_TRACT | Status: DC | PRN

## 2011-10-29 MED ORDER — ONDANSETRON HCL 4 MG PO TABS: 4.0000 mg | ORAL_TABLET | Freq: Four times a day (QID) | ORAL | Status: DC | PRN

## 2011-10-29 MED ORDER — ACETAMINOPHEN 650 MG RE SUPP
650.0000 mg | Freq: Four times a day (QID) | RECTAL | Status: DC | PRN
  Filled 2011-10-29: qty 2

## 2011-10-29 MED ORDER — SODIUM CHLORIDE 0.9 % IV SOLN
Freq: Once | INTRAVENOUS | Status: AC
  Administered 2011-10-29: 13:00:00 via INTRAVENOUS

## 2011-10-29 MED ORDER — SIMVASTATIN 10 MG PO TABS
10.0000 mg | ORAL_TABLET | Freq: Every day | ORAL | Status: DC
  Administered 2011-10-30 – 2011-10-31 (×2): 10 mg via ORAL
  Filled 2011-10-29 (×3): qty 1

## 2011-10-29 MED ORDER — HEPARIN (PORCINE) IN NACL 100-0.45 UNIT/ML-% IJ SOLN
1100.0000 [IU]/h | INTRAMUSCULAR | Status: DC
  Administered 2011-10-29: 1100 [IU]/h via INTRAVENOUS
  Filled 2011-10-29 (×2): qty 250

## 2011-10-29 MED ORDER — HYDROCODONE-ACETAMINOPHEN 5-325 MG PO TABS: 1.0000 | ORAL_TABLET | ORAL | Status: DC | PRN

## 2011-10-29 MED ORDER — GUAIFENESIN-DM 100-10 MG/5ML PO SYRP
5.0000 mL | ORAL_SOLUTION | ORAL | Status: DC | PRN
  Administered 2011-10-29: 5 mL via ORAL
  Filled 2011-10-29: qty 5

## 2011-10-29 MED ORDER — IOHEXOL 300 MG/ML  SOLN
20.0000 mL | INTRAMUSCULAR | Status: AC
  Administered 2011-10-29 (×2): 20 mL via ORAL

## 2011-10-29 MED ORDER — VITAMIN D3 25 MCG (1000 UT) PO CAPS: 1.0000 | ORAL_CAPSULE | Freq: Every day | ORAL | Status: DC

## 2011-10-29 MED ORDER — TRIAMTERENE-HCTZ 37.5-25 MG PO TABS
1.0000 | ORAL_TABLET | Freq: Every day | ORAL | Status: DC
  Administered 2011-10-29 – 2011-11-01 (×3): 1 via ORAL
  Filled 2011-10-29 (×4): qty 1

## 2011-10-29 MED ORDER — VANCOMYCIN HCL IN DEXTROSE 1-5 GM/200ML-% IV SOLN
1000.0000 mg | INTRAVENOUS | Status: AC
  Administered 2011-10-30: 1000 mg via INTRAVENOUS
  Filled 2011-10-29: qty 200

## 2011-10-29 MED ORDER — MORPHINE SULFATE 2 MG/ML IJ SOLN: 1.0000 mg | INTRAMUSCULAR | Status: DC | PRN

## 2011-10-29 MED ORDER — METOPROLOL TARTRATE 50 MG PO TABS
50.0000 mg | ORAL_TABLET | Freq: Two times a day (BID) | ORAL | Status: DC
  Administered 2011-10-29 – 2011-11-01 (×5): 50 mg via ORAL
  Filled 2011-10-29 (×9): qty 1

## 2011-10-29 MED ORDER — ONDANSETRON HCL 4 MG/2ML IJ SOLN
4.0000 mg | Freq: Once | INTRAMUSCULAR | Status: AC
  Administered 2011-10-29: 4 mg via INTRAVENOUS
  Filled 2011-10-29: qty 2

## 2011-10-29 MED ORDER — ONDANSETRON HCL 4 MG/2ML IJ SOLN: 4.0000 mg | Freq: Four times a day (QID) | INTRAMUSCULAR | Status: DC | PRN

## 2011-10-29 MED ORDER — SODIUM CHLORIDE 0.9 % IV SOLN
INTRAVENOUS | Status: DC
  Administered 2011-10-29: 23:00:00 via INTRAVENOUS

## 2011-10-29 MED ORDER — ALUM & MAG HYDROXIDE-SIMETH 200-200-20 MG/5ML PO SUSP: 30.0000 mL | Freq: Four times a day (QID) | ORAL | Status: DC | PRN

## 2011-10-29 MED ORDER — IOHEXOL 300 MG/ML  SOLN
80.0000 mL | Freq: Once | INTRAMUSCULAR | Status: AC | PRN
  Administered 2011-10-29: 80 mL via INTRAVENOUS

## 2011-10-29 MED ORDER — PANTOPRAZOLE SODIUM 40 MG IV SOLR
40.0000 mg | Freq: Once | INTRAVENOUS | Status: AC
  Administered 2011-10-29: 40 mg via INTRAVENOUS
  Filled 2011-10-29: qty 40

## 2011-10-29 NOTE — ED Notes (Signed)
IV attempted without success by 2 RN's.  Pt has had 3 IV sticks.  IV therapy paged.

## 2011-10-29 NOTE — ED Notes (Signed)
DENIES ABD PAIN. DENIES NAUSEA. STATES SOME SORENESS TO EPIGASTRIC AREA FROM EMESIS. STATES ATE AN ORANGE YESTERDAY PM AND FELT LIKE IT WAS "STUCK" AND HER ABDOMEN FELT SWOLLEN. STATES ONSET EMESIS APPROX 1 HOUR AFTER ORANGE. AND CONTINUED THRU THE NIGHT. LAST EMESIS AROUND 0830 THIS MORNING. STATES HAS ALSO HAD A HEADACHE AND FELT DIZZY.

## 2011-10-29 NOTE — ED Provider Notes (Signed)
History     CSN: [id number]  Arrival date & time 10/29/11  1001   First MD Initiated Contact with Patient 10/29/11 1128      Chief Complaint  Patient presents with  . Emesis  . Abdominal Pain    (Consider location/radiation/quality/duration/timing/severity/associated sxs/prior treatment) Patient is a 78 y.o. female presenting with abdominal pain. The history is provided by the patient (Patient complains of epigastric abdominal pain with vomiting yesterday and today.). No language interpreter was used.  Abdominal Pain The primary symptoms of the illness include abdominal pain and nausea. The primary symptoms of the illness do not include fever or fatigue. The current episode started 13 to 24 hours ago. The onset of the illness was sudden. The problem has not changed since onset. Associated with: nothing. The patient states that she believes she is currently not pregnant. The patient has not had a change in bowel habit. Risk factors: unkown. Additional symptoms associated with the illness include anorexia. Symptoms associated with the illness do not include chills, hematuria, frequency or back pain. Significant associated medical issues do not include PUD.    Past Medical History  Diagnosis Date  . Pulmonary embolism   . COPD (chronic obstructive pulmonary disease)   . DM (diabetes mellitus)   . Dyslipidemia   . Hypertension   . Hyperlipidemia     Past Surgical History  Procedure Date  . Vesicovaginal fistula closure w/ tah     Family History  Problem Relation Age of Onset  . Heart disease Mother   . Hypertension Mother   . Heart disease Sister     x 3  . Hypertension Sister   . Clotting disorder Daughter   . Clotting disorder Son     multiple sons  . Hypertension Father   . Hypertension Brother     History  Substance Use Topics  . Smoking status: Former Smoker -- 0.5 packs/day for 54 years    Quit date: 01/23/2010  . Smokeless tobacco: Not on file  . Alcohol  Use: No    OB History    Grav Para Term Preterm Abortions TAB SAB Ect Mult Living                  Review of Systems  Constitutional: Negative for fever, chills and fatigue.  HENT: Negative for congestion, sinus pressure and ear discharge.   Eyes: Negative for discharge.  Respiratory: Negative for cough.   Cardiovascular: Negative for chest pain.  Gastrointestinal: Positive for nausea, abdominal pain and anorexia.  Genitourinary: Negative for frequency and hematuria.  Musculoskeletal: Negative for back pain.  Skin: Negative for rash.  Neurological: Positive for headaches. Negative for seizures.  Hematological: Negative.   Psychiatric/Behavioral: Negative for hallucinations.    Allergies  Penicillins  Home Medications   Current Outpatient Rx  Name Route Sig Dispense Refill  . ALBUTEROL SULFATE (2.5 MG/3ML) 0.083% IN NEBU Nebulization Take 2.5 mg by nebulization every 6 (six) hours as needed. For shortness of breath    . ASPIRIN 81 MG PO TABS Oral Take 81 mg by mouth daily.      Marland Kitchen VITAMIN D3 1000 UNITS PO CAPS Oral Take 1 capsule by mouth daily.      Marland Kitchen GLIPIZIDE 5 MG PO TABS Oral Take 5 mg by mouth daily.     Marland Kitchen HYDROCODONE-ACETAMINOPHEN 5-325 MG PO TABS Oral Take 1 tablet by mouth 4 (four) times daily as needed. For pain    . LOVASTATIN 20 MG PO TABS Oral  Take 20 mg by mouth at bedtime.      Marland Kitchen METFORMIN HCL 500 MG PO TABS Oral Take 500 mg by mouth 2 (two) times daily with a meal.     . METOPROLOL TARTRATE 50 MG PO TABS Oral Take 1 tablet by mouth 2 (two) times daily.     Marland Kitchen ONE-DAILY MULTI VITAMINS PO TABS Oral Take 1 tablet by mouth daily.      Marland Kitchen PANTOPRAZOLE SODIUM 40 MG PO TBEC Oral Take 1 tablet by mouth Daily.    Marland Kitchen TIOTROPIUM BROMIDE MONOHYDRATE 18 MCG IN CAPS Inhalation Place 18 mcg into inhaler and inhale daily.      . TRIAMTERENE-HCTZ 37.5-25 MG PO TABS Oral Take 1 tablet by mouth daily.      . WARFARIN SODIUM 5 MG PO TABS Oral Take 5 mg by mouth daily. Take at 6pm on  an empty stomach       BP 160/99  Pulse 117  Temp(Src) 98.3 F (36.8 C) (Oral)  Resp 20  Ht 5\' 5"  (1.651 m)  Wt 180 lb (81.647 kg)  BMI 29.95 kg/m2  SpO2 92%  Physical Exam  Constitutional: She is oriented to person, place, and time. She appears well-developed.  HENT:  Head: Normocephalic and atraumatic.  Eyes: Conjunctivae and EOM are normal. No scleral icterus.  Neck: Neck supple. No thyromegaly present.  Cardiovascular: Normal rate and regular rhythm.  Exam reveals no gallop and no friction rub.   No murmur heard. Pulmonary/Chest: No stridor. She has no wheezes. She has no rales. She exhibits no tenderness.  Abdominal: She exhibits no distension. There is tenderness. There is no rebound.       Tender epigastric  Musculoskeletal: Normal range of motion. She exhibits no edema.  Lymphadenopathy:    She has no cervical adenopathy.  Neurological: She is oriented to person, place, and time. Coordination normal.  Skin: No rash noted. No erythema.  Psychiatric: She has a normal mood and affect. Her behavior is normal.    ED Course  Procedures (including critical care time)   Labs Reviewed  CBC  DIFFERENTIAL  COMPREHENSIVE METABOLIC PANEL  PROTIME-INR  LIPASE, BLOOD   No results found.   No diagnosis found.    MDM          Benny Lennert, MD 11/01/11 [phone]

## 2011-10-29 NOTE — ED Notes (Signed)
Gerilyn Pilgrim, pt son, phone [phone].

## 2011-10-29 NOTE — ED Notes (Signed)
Patient reports she had onset of n/v on yesterday.  She states she feels like she has swelling in her abd.  Patient also reports headache,  She could not sleep last night due to headache

## 2011-10-29 NOTE — Consult Note (Signed)
[facility]  Consult Note  Patient name: Alejandra [name] MRN: [id number] DOB: 07/20/1931 Sex: female  Consulting Physician: Shanker Ghimire  Reason for Consult:  Chief Complaint   Patient presents with   .  Emesis   .  Abdominal Pain    HISTORY OF PRESENT ILLNESS:  This is a 78 yo with HTN, VTE on coumadin, COPD-on home 02, comes in with abdominal pain for the past few days-but worse since yesterday. This was associated with emesis and bloating.. She had minimal pain, but mildly in the epigastrum. No RUQ tenderness.This was then associated numerous episodes of vomiting. She denies any fever, or diarrhea. She continued to have emesis and came to the [facility] today. Her pain is resolved. She got a CT scan that shows an enlarged AAA up to 6x6cm and possible cholecystitis but gallbladder otherwise looks similar to her scan one year ago. Given her multiple medical issues, she has now been admitted to the hospitalist service for further evaluation and treatment. I have been asked to assist with management of her aneurysm.  Past Medical History   Diagnosis  Date   .  Pulmonary embolism    .  COPD (chronic obstructive pulmonary disease)    .  DM (diabetes mellitus)    .  Dyslipidemia    .  Hypertension    .  Hyperlipidemia     Past Surgical History   Procedure  Date   .  Vesicovaginal fistula closure w/ tah     History    Social History   .  Marital Status:  Widowed     Spouse Name:  N/A     Number of Children:  N/A   .  Years of Education:  N/A    Occupational History   .  retired      domestic worker    Social History Main Topics   .  Smoking status:  Former Smoker -- 0.5 packs/day for 54 years     Quit date:  01/23/2010   .  Smokeless tobacco:  Not on file   .  Alcohol Use:  No   .  Drug Use:  No   .  Sexually Active:  Not on file    Other Topics  Concern   .  Not on file    Social History Narrative   .  No narrative on file    Family History     Problem  Relation  Age of Onset   .  Heart disease  Mother    .  Hypertension  Mother    .  Heart disease  Sister       x 3    .  Hypertension  Sister    .  Clotting disorder  Daughter    .  Clotting disorder  Son       multiple sons    .  Hypertension  Father    .  Hypertension  Brother     Allergies as of 10/29/2011 - Review Complete 10/29/2011   Allergen  Reaction  Noted   .  Penicillins  Other (See Comments)     No current facility-administered medications on file prior to encounter.    Current Outpatient Prescriptions on File Prior to Encounter   Medication  Sig  Dispense  Refill   .  aspirin 81 MG tablet  Take 81 mg by mouth daily.     .  Cholecalciferol (VITAMIN D3) 1000 UNITS   CAPS  Take 1 capsule by mouth daily.     .  glipiZIDE (GLUCOTROL) 5 MG tablet  Take 5 mg by mouth daily.     .  lovastatin (MEVACOR) 20 MG tablet  Take 20 mg by mouth at bedtime.     .  metFORMIN (GLUCOPHAGE) 500 MG tablet  Take 500 mg by mouth 2 (two) times daily with a meal.     .  metoprolol (LOPRESSOR) 50 MG tablet  Take 1 tablet by mouth 2 (two) times daily.     .  Multiple Vitamin (MULTIVITAMIN) tablet  Take 1 tablet by mouth daily.     .  pantoprazole (PROTONIX) 40 MG tablet  Take 1 tablet by mouth Daily.     .  tiotropium (SPIRIVA) 18 MCG inhalation capsule  Place 18 mcg into inhaler and inhale daily.     .  triamterene-hydrochlorothiazide (MAXZIDE-25) 37.5-25 MG per tablet  Take 1 tablet by mouth daily.     .  warfarin (COUMADIN) 5 MG tablet  Take 5 mg by mouth daily. Take at 6pm on an empty stomach     .  DISCONTD: albuterol (PROVENTIL) (2.5 MG/3ML) 0.083% nebulizer solution  Take 3 mLs (2.5 mg total) by nebulization every 6 (six) hours as needed for wheezing.  90 mL  6    REVIEW OF SYSTEMS:  Cardiovascular: No chest pain, chest pressure, palpitations, orthopnea, or dyspnea on exertion. No claudication or rest pain, History of DVT and PE on coumadin  Pulmonary: No productive cough,  asthma or wheezing.On home o2 For COPD  Neurologic: No weakness, paresthesias, aphasia, or amaurosis. No dizziness.  Hematologic: No bleeding problems or clotting disorders.  Musculoskeletal: No joint pain or joint swelling.  Gastrointestinal: No blood in stool or hematemesis. History of GIB. Positive emesis  Genitourinary: No dysuria or hematuria.  Psychiatric:: No history of major depression.  Integumentary: No rashes or ulcers.  Constitutional: No fever or chills.  PHYSICAL EXAMINATION:  General: The patient appears their stated age. Vital signs are BP 148/101  Pulse 91  Temp(Src) 97.6 F (36.4 C) (Oral)  Resp 18  Ht 5' 5" (1.651 m)  Wt 180 lb (81.647 kg)  BMI 29.95 kg/m2  SpO2 95%  Pulmonary: Respirations are non-labored. on oxygen Lutz  HEENT: No gross abnormalities  Abdomen: Soft aneurysm is easily palpable and tender  Musculoskeletal: There are no major deformities.  Neurologic: No focal weakness or paresthesias are detected,  Skin: There are no ulcer or rashes noted.  Psychiatric: The patient has normal affect.  Cardiovascular: There is a regular rate and rhythm without significant murmur appreciated.Palpable DP bilaterally  Diagnostic Studies:  CT scan was reviewed. She has a 6.6 cm infrarenal AAA without signs of rupture  Medication Changes:  Hold coumadin, give vit K  Assessment:  Symptomatic AAA  Plan:  I feel that the patient has a symptomatic AAA. She has tenderness over her aneurysm. Her aneurysm has grown 1.4 cm inside of one year. I discussed treating this endovascularly. We discussed the risks of surgery including but not limited to death renal issues, intestinal ischemia, cardiopulmonary complications. I also discussed this with her son via telephone. I will plan on doing this in the morning. I will give her Vit K tonight to help reverse her coumadin. I will stop her heparin drip on call to the operating room. I feel without treatment that she is at high risk of  rupture  V. Wells Mayco Walrond IV, M.D.  [facility]

## 2011-10-29 NOTE — Consult Note (Signed)
Alejandra [name] 17-Aug-1931  [id number].   Primary Care MD: Dr. Ronne Binning Pulmonology: Dr. Marchelle Gearing Requesting MD: Dr. Estell Harpin Chief Complaint/Reason for Consult: rule out cholecystitis HPI: This is a 78 yo female who ate an orange yesterday around 1100am.  She then developed some abdominal bloating and then emesis.  She had minimal pain, but mildly in the epigastrum.  No RUQ tenderness.  She said she's never had an episode like this, but in review of her records it looks like she was admitted a year ago with similar symptoms.  She was found to have cholelithiasis but also a large AAA.  She quickly got better and went home.  She continued to have emesis and came to the [facility] today.  Her pain is resolved.  She got a CT scan that shows an enlarged AAA up to 6x6cm and possible cholecystitis but gallbladder otherwise looks similar to her scan one year ago.  We have been asked to evaluate for possible cholecystitis.  Her labs are all normal.  Review of Systems: Please see HPI, otherwise all other systems are currently negative.  Family History  Problem Relation Age of Onset  . Heart disease Mother   . Hypertension Mother   . Heart disease Sister     x 3  . Hypertension Sister   . Clotting disorder Daughter   . Clotting disorder Son     multiple sons  . Hypertension Father   . Hypertension Brother     Past Medical History  Diagnosis Date  . Pulmonary embolism   . COPD (chronic obstructive pulmonary disease)   . DM (diabetes mellitus)   . Dyslipidemia   . Hypertension   . Hyperlipidemia     Past Surgical History  Procedure Date  . Vesicovaginal fistula closure w/ tah     Social History:  reports that she quit smoking about 21 months ago. She does not have any smokeless tobacco history on file. She reports that she does not drink alcohol or use illicit drugs.  Allergies:  Allergies  Allergen Reactions  . Penicillins Other (See Comments)    Childhood reaction    Medications Prior  to Admission  Medication Dose Route Frequency Provider Last Rate Last Dose  . 0.9 %  sodium chloride infusion   Intravenous Once Benny Lennert, MD 100 mL/hr at 10/29/11 1303    . HYDROmorphone (DILAUDID) injection 1 mg  1 mg Intravenous Once Benny Lennert, MD   1 mg at 10/29/11 1306  . iohexol (OMNIPAQUE) 300 MG/ML solution 20 mL  20 mL Oral Q1 Hr x 2 Medication Radiologist, MD   20 mL at 10/29/11 1208  . iohexol (OMNIPAQUE) 300 MG/ML solution 80 mL  80 mL Intravenous Once PRN Medication Radiologist, MD   80 mL at 10/29/11 1501  . ondansetron (ZOFRAN) injection 4 mg  4 mg Intravenous Once Benny Lennert, MD   4 mg at 10/29/11 1304  . pantoprazole (PROTONIX) injection 40 mg  40 mg Intravenous Once Benny Lennert, MD   40 mg at 10/29/11 1314   Medications Prior to Admission  Medication Sig Dispense Refill  . albuterol (PROVENTIL) (2.5 MG/3ML) 0.083% nebulizer solution Take 2.5 mg by nebulization every 6 (six) hours as needed. For shortness of breath      . aspirin 81 MG tablet Take 81 mg by mouth daily.        . Cholecalciferol (VITAMIN D3) 1000 UNITS CAPS Take 1 capsule by mouth daily.        Marland Kitchen  glipiZIDE (GLUCOTROL) 5 MG tablet Take 5 mg by mouth daily.       Marland Kitchen lovastatin (MEVACOR) 20 MG tablet Take 20 mg by mouth at bedtime.        . metFORMIN (GLUCOPHAGE) 500 MG tablet Take 500 mg by mouth 2 (two) times daily with a meal.       . metoprolol (LOPRESSOR) 50 MG tablet Take 1 tablet by mouth 2 (two) times daily.       . Multiple Vitamin (MULTIVITAMIN) tablet Take 1 tablet by mouth daily.        . pantoprazole (PROTONIX) 40 MG tablet Take 1 tablet by mouth Daily.      Marland Kitchen tiotropium (SPIRIVA) 18 MCG inhalation capsule Place 18 mcg into inhaler and inhale daily.        Marland Kitchen triamterene-hydrochlorothiazide (MAXZIDE-25) 37.5-25 MG per tablet Take 1 tablet by mouth daily.        Marland Kitchen warfarin (COUMADIN) 5 MG tablet Take 5 mg by mouth daily. Take at 6pm on an empty stomach       . DISCONTD: albuterol  (PROVENTIL) (2.5 MG/3ML) 0.083% nebulizer solution Take 3 mLs (2.5 mg total) by nebulization every 6 (six) hours as needed for wheezing.  90 mL  6    Blood pressure 143/87, pulse 98, temperature 98.2 F (36.8 C), temperature source Oral, resp. rate 20, height 5\' 5"  (1.651 m), weight 180 lb (81.647 kg), SpO2 92.00%. Physical Exam: General: pleasant, WD, WN black female who is laying in bed in NAD HEENT: head is normocephalic, atraumatic.  Sclera are noninjected.  PERRL.  Ears and nose without any masses or lesions.  Mouth is pink and moist Heart: regular, rate, and rhythm.  Normal s1,s2. No obvious murmurs, gallops, or rubs noted.  Palpable radial and pedal pulses bilaterally Lungs: CTAB, no wheezes, rhonchi, or rales noted.  Respiratory effort nonlabored Abd: soft, NT, except for very mild epigastric soreness, ND, +BS, no masses, hernias, or organomegaly MS: all 4 extremities are symmetrical with no cyanosis, clubbing, or edema. Skin: warm and dry with no masses, lesions, or rashes Psych: A&Ox3 with an appropriate affect.    Results for orders placed during the hospital encounter of 10/29/11 (from the past 48 hour(s))  CBC     Status: Abnormal   Collection Time   10/29/11 12:04 PM      Component Value Range Comment   WBC 7.8  4.0 - 10.5 (K/uL)    RBC 4.88  3.87 - 5.11 (MIL/uL)    Hemoglobin 13.6  12.0 - 15.0 (g/dL)    HCT 04.5  40.9 - 81.1 (%)    MCV 76.2 (*) 78.0 - 100.0 (fL)    MCH 27.9  26.0 - 34.0 (pg)    MCHC 36.6 (*) 30.0 - 36.0 (g/dL)    RDW 91.4  78.2 - 95.6 (%)    Platelets 193  150 - 400 (K/uL)   DIFFERENTIAL     Status: Normal   Collection Time   10/29/11 12:04 PM      Component Value Range Comment   Neutrophils Relative 69  43 - 77 (%)    Neutro Abs 5.4  1.7 - 7.7 (K/uL)    Lymphocytes Relative 22  12 - 46 (%)    Lymphs Abs 1.7  0.7 - 4.0 (K/uL)    Monocytes Relative 10  3 - 12 (%)    Monocytes Absolute 0.8  0.1 - 1.0 (K/uL)    Eosinophils Relative 0  0 - 5 (%)  Eosinophils Absolute 0.0  0.0 - 0.7 (K/uL)    Basophils Relative 0  0 - 1 (%)    Basophils Absolute 0.0  0.0 - 0.1 (K/uL)   COMPREHENSIVE METABOLIC PANEL     Status: Abnormal   Collection Time   10/29/11 12:04 PM      Component Value Range Comment   Sodium 141  135 - 145 (mEq/L)    Potassium 3.5  3.5 - 5.1 (mEq/L)    Chloride 98  96 - 112 (mEq/L)    CO2 29  19 - 32 (mEq/L)    Glucose, Bld 186 (*) 70 - 99 (mg/dL)    BUN 21  6 - 23 (mg/dL)    Creatinine, Ser 8.29  0.50 - 1.10 (mg/dL)    Calcium 9.8  8.4 - 10.5 (mg/dL)    Total Protein 8.1  6.0 - 8.3 (g/dL)    Albumin 4.2  3.5 - 5.2 (g/dL)    AST 21  0 - 37 (U/L)    ALT 17  0 - 35 (U/L)    Alkaline Phosphatase 36 (*) 39 - 117 (U/L)    Total Bilirubin 0.5  0.3 - 1.2 (mg/dL)    GFR calc non Af Amer 53 (*) >90 (mL/min)    GFR calc Af Amer 61 (*) >90 (mL/min)   PROTIME-INR     Status: Abnormal   Collection Time   10/29/11 12:04 PM      Component Value Range Comment   Prothrombin Time 17.6 (*) 11.6 - 15.2 (seconds)    INR 1.42  0.00 - 1.49    LIPASE, BLOOD     Status: Normal   Collection Time   10/29/11 12:04 PM      Component Value Range Comment   Lipase 38  11 - 59 (U/L)   URINALYSIS, ROUTINE W REFLEX MICROSCOPIC     Status: Abnormal   Collection Time   10/29/11  2:14 PM      Component Value Range Comment   Color, Urine YELLOW  YELLOW     APPearance CLEAR  CLEAR     Specific Gravity, Urine 1.030  1.005 - 1.030     pH 5.5  5.0 - 8.0     Glucose, UA 100 (*) NEGATIVE (mg/dL)    Hgb urine dipstick MODERATE (*) NEGATIVE     Bilirubin Urine NEGATIVE  NEGATIVE     Ketones, ur NEGATIVE  NEGATIVE (mg/dL)    Protein, ur >562 (*) NEGATIVE (mg/dL)    Urobilinogen, UA 0.2  0.0 - 1.0 (mg/dL)    Nitrite NEGATIVE  NEGATIVE     Leukocytes, UA NEGATIVE  NEGATIVE    URINE MICROSCOPIC-ADD ON     Status: Abnormal   Collection Time   10/29/11  2:14 PM      Component Value Range Comment   Squamous Epithelial / LPF RARE  RARE     WBC, UA 0-2  <3  (WBC/hpf)    RBC / HPF 7-10  <3 (RBC/hpf)    Casts HYALINE CASTS (*) NEGATIVE  GRANULAR CAST   Ct Abdomen Pelvis W Contrast  10/29/2011  *RADIOLOGY REPORT*  Clinical Data: 78 year old female with abdominal and pelvic pain, abdominal distention and nausea/vomiting.  CT ABDOMEN AND PELVIS WITH CONTRAST  Technique:  Multidetector CT imaging of the abdomen and pelvis was performed following the standard protocol during bolus administration of intravenous contrast.  Contrast: 80 ml intravenous Omnipaque-300  Comparison: 11/22/2010 CT and ultrasound.  Findings: Gallbladder wall thickening and pericholecystic  fluid/inflammation is again identified and relatively unchanged from the prior study.  Cholecystitis is not excluded despite relative stability since 11/22/2010.  A 6 x 6 cm infrarenal suprailiac abdominal aortic aneurysm is identified and increased in size, previously measuring 5.2 x 5.3 cm.  A large amount of posterior and left peripheral thrombus is noted. There is no evidence of rupture.  The CBD is within normal limits.  The liver, spleen, right adrenal gland pancreas are unremarkable. Fullness of the left adrenal gland is stable. Bilateral renal cortical atrophy is again noted.  No free fluid, enlarged lymph nodes or biliary dilatation identified.  Colonic diverticulosis is noted without evidence of diverticulitis.  No acute or suspicious bony abnormalities are noted.  Moderate to severe degenerative changes in the lower lumbar spine are again noted.  IMPRESSION: Enlarging 6 x 6 cm infrarenal suprailiac abdominal aortic aneurysm without evidence of rupture.  This aneurysm previously measured 5.2 x 5.3 cm on 11/22/2010.  Gallbladder wall thickening, pericholecystic fluid and inflammation - relatively unchanged from 11/22/2010, but still suspicious for cholecystitis.  These results were called to Dr. Estell Harpin on 10/29/2011 at 3:30 p.m.  Original Report Authenticated By: Rosendo Gros, M.D.        Assessment/Plan 1. Abdominal pain 2. Cholelithiasis, ? Cholecystitis (CT unchanged from one year ago) 3. Enlargement of AAA 4. H/o PE 5. Copd on home O2 6. DM 7. HTN  Plan: 1. The patient had some epigastric abdominal pain that was minimal and resolved quickly along with some emesis.  It is difficult to say that the patient has cholecystitis given the fact that her CT scan is unchanged, she is now asymptomatic, and her labs are all normal.  We will obtain a HIDA scan to further evaluate the gallbladder.  She does need a vascular consult to evaluate this enlargening AAA that is now 6.3x6.2cm.  We would recommend a medicine admission given her multiple problems and further work up for her abdominal symptoms and AAA.  We will follow with you.  Dinna Severs E 10/29/2011, 4:45 PM

## 2011-10-29 NOTE — ED Provider Notes (Signed)
Medical screening examination/treatment/procedure(s) were conducted as a shared visit with non-physician practitioner(s) and myself.  I personally evaluated the patient during the encounter   Darean Rote L Cathleen Yagi, MD 10/29/11 1807 

## 2011-10-29 NOTE — ED Provider Notes (Signed)
Pt presented to ED w/ abd pain and N/V and was moved to CDU to await CT abd/pelvis.  Pt reports that she is pain and nausea-free currently.  On repeat exam, NAD, VSS, mild tenderness from epigastrium to mid-line lower abd.  CT abd/pending.  Kirichenko, PA-C to dispo.   Otilio Miu, PA 10/29/11 1758

## 2011-10-29 NOTE — ED Notes (Signed)
AWAIT SURGERY CONSULT

## 2011-10-29 NOTE — Consult Note (Signed)
Mild epigastric pain. GB looks the same as on CT 1 year ago.  AAA larger.  HIDA pending Patient examined and I agree with the assessment and plan  Violeta Gelinas, MD, MPH, FACS Pager: [phone]  10/29/2011 6:20 PM

## 2011-10-29 NOTE — ED Provider Notes (Signed)
Medical screening examination/treatment/procedure(s) were conducted as a shared visit with non-physician practitioner(s) and myself.  I personally evaluated the patient during the encounter   Benny Lennert, MD 10/29/11 1807

## 2011-10-29 NOTE — ED Notes (Signed)
Patient transported to CT 

## 2011-10-29 NOTE — ED Provider Notes (Signed)
Pt's care taken over at shift change. Pt with epigastric abdominal pain for several weeks, nausea, vomiting. Pt had a CT abdomen done which showed possible cholecystitis and enlargement of infrarenal suprailiac abdominal aortic aneurysm without any evidence of a rupture. Surgery was consulted, seen pt, feel like she may benefit from a HIDA scan. Unable to do it tonight, bc pt has had narcotic pain medication. Surgery team asked pt to be admitted by medicine due to her multiple co morbidities. I spoke with triad, will come and see her. Pt is currently in NAD. Abdominal pain resolved. She is AAOx3, lungs clear to auscultation bilaterally, regular HR and rhythm, epigastric tenderness on palpation, no guarding.   Lottie Mussel, PA 10/29/11 1727

## 2011-10-29 NOTE — Anesthesia Preprocedure Evaluation (Addendum)
Anesthesia Evaluation  Patient identified by MRN, date of birth, ID band Patient awake    Reviewed: Allergy & Precautions, H&P , NPO status , Patient's Chart, lab work & pertinent test results, reviewed documented beta blocker date and time   Airway Mallampati: I TM Distance: >3 FB Neck ROM: Full    Dental  (+) Partial Lower, Edentulous Upper and Dental Advisory Given   Pulmonary COPD COPD inhaler and oxygen dependent, former smoker (quit 2 years ago) breath sounds clear to auscultation  Pulmonary exam normal       Cardiovascular hypertension, Pt. on medications and Pt. on home beta blockers Rhythm:Regular Rate:Normal     Neuro/Psych negative neurological ROS     GI/Hepatic Neg liver ROS, GERD-  Controlled,  Endo/Other  Diabetes mellitus-, Type 2, Oral Hypoglycemic Agents  Renal/GU negative Renal ROS     Musculoskeletal   Abdominal (+) + obese,   Peds  Hematology   Anesthesia Other Findings   Reproductive/Obstetrics                           Anesthesia Physical Anesthesia Plan  ASA: III  Anesthesia Plan: General   Post-op Pain Management:    Induction: Intravenous  Airway Management Planned: Oral ETT  Additional Equipment: Arterial line, CVP and Ultrasound Guidance Line Placement  Intra-op Plan:   Post-operative Plan: Extubation in OR  Informed Consent: I have reviewed the patients History and Physical, chart, labs and discussed the procedure including the risks, benefits and alternatives for the proposed anesthesia with the patient or authorized representative who has indicated his/her understanding and acceptance.   Dental advisory given  Plan Discussed with: CRNA and Surgeon  Anesthesia Plan Comments: (Plan routine monitors, A line, central access, GETA)        Anesthesia Quick Evaluation

## 2011-10-29 NOTE — H&P (Signed)
PATIENT DETAILS Name: Alejandra [name] Age: 78 y.o. Sex: female Date of Birth: 02/03/1932 Admit Date: 10/29/2011 [id number], Adela Lank, MD, MD   CHIEF COMPLAINT:  Abdominal pain  HPI: This is a 78 yo with HTN, VTE on coumadin, COPD-on home 02, comes in with abdominal pain for the past few days-but worse since yesterday, apparently she ate an orange yesterday around 1100am. She then developed some abdominal bloating and then emesis. She had minimal pain, but mildly in the epigastrum. No RUQ tenderness.This was then associated numerous episodes of vomiting. She denies any fever, or diarrhea. She continued to have emesis and came to the [facility] today. Her pain is resolved. She got a CT scan that shows an enlarged AAA up to 6x6cm and possible cholecystitis but gallbladder otherwise looks similar to her scan one year ago. Given her multiple medical issues, she has now been admitted to the hospitalist service for further evaluation and treatment   ALLERGIES:   Allergies  Allergen Reactions  . Penicillins Other (See Comments)    Childhood reaction    PAST MEDICAL HISTORY: Past Medical History  Diagnosis Date  . Pulmonary embolism   . COPD (chronic obstructive pulmonary disease)   . DM (diabetes mellitus)   . Dyslipidemia   . Hypertension   . Hyperlipidemia     PAST SURGICAL HISTORY: Past Surgical History  Procedure Date  . Vesicovaginal fistula closure w/ tah     MEDICATIONS AT HOME: Prior to Admission medications   Medication Sig Start Date End Date Taking? Authorizing Provider  albuterol (PROVENTIL) (2.5 MG/3ML) 0.083% nebulizer solution Take 2.5 mg by nebulization every 6 (six) hours as needed. For shortness of breath 05/07/11 05/06/12 Yes Kalman Shan, MD  aspirin 81 MG tablet Take 81 mg by mouth daily.     Yes Historical Provider, MD  Cholecalciferol (VITAMIN D3) 1000 UNITS CAPS Take 1 capsule by mouth daily.     Yes Historical Provider, MD  glipiZIDE (GLUCOTROL) 5 MG  tablet Take 5 mg by mouth daily.    Yes Historical Provider, MD  HYDROcodone-acetaminophen (NORCO) 5-325 MG per tablet Take 1 tablet by mouth 4 (four) times daily as needed. For pain   Yes Historical Provider, MD  lovastatin (MEVACOR) 20 MG tablet Take 20 mg by mouth at bedtime.     Yes Historical Provider, MD  metFORMIN (GLUCOPHAGE) 500 MG tablet Take 500 mg by mouth 2 (two) times daily with a meal.    Yes Historical Provider, MD  metoprolol (LOPRESSOR) 50 MG tablet Take 1 tablet by mouth 2 (two) times daily.  10/22/10  Yes Historical Provider, MD  Multiple Vitamin (MULTIVITAMIN) tablet Take 1 tablet by mouth daily.     Yes Historical Provider, MD  pantoprazole (PROTONIX) 40 MG tablet Take 1 tablet by mouth Daily. 04/05/11  Yes Historical Provider, MD  tiotropium (SPIRIVA) 18 MCG inhalation capsule Place 18 mcg into inhaler and inhale daily.     Yes Historical Provider, MD  triamterene-hydrochlorothiazide (MAXZIDE-25) 37.5-25 MG per tablet Take 1 tablet by mouth daily.     Yes Historical Provider, MD  warfarin (COUMADIN) 5 MG tablet Take 5 mg by mouth daily. Take at 6pm on an empty stomach    Yes Historical Provider, MD    FAMILY HISTORY: Family History  Problem Relation Age of Onset  . Heart disease Mother   . Hypertension Mother   . Heart disease Sister     x 3  . Hypertension Sister   . Clotting disorder Daughter   .  Clotting disorder Son     multiple sons  . Hypertension Father   . Hypertension Brother     SOCIAL HISTORY:  reports that she quit smoking about 21 months ago. She does not have any smokeless tobacco history on file. She reports that she does not drink alcohol or use illicit drugs.  REVIEW OF SYSTEMS:  Constitutional:   No  weight loss, night sweats,  Fevers, chills, fatigue.  HEENT:    No headaches, Difficulty swallowing,Tooth/dental problems,Sore throat,  No sneezing, itching, ear ache, nasal congestion, post nasal drip,   Cardio-vascular: No chest pain,   Orthopnea, PND, swelling in lower extremities, anasarca,         dizziness, palpitations  GI:  No heartburn, indigestion,  diarrhea, change in bowel habits, loss of appetite  Resp: No shortness of breath with exertion or at rest.  No excess mucus, no productive cough, No non-productive cough,  No coughing up of blood.No change in color of mucus.No wheezing.No chest wall deformity  Skin:  no rash or lesions.  GU:  no dysuria, change in color of urine, no urgency or frequency.  No flank pain.  Musculoskeletal: No joint pain or swelling.  No decreased range of motion.  No back pain.  Psych: No change in mood or affect. No depression or anxiety.  No memory loss.   PHYSICAL EXAM: Blood pressure 143/87, pulse 98, temperature 98.2 F (36.8 C), temperature source Oral, resp. rate 20, height 5\' 5"  (1.651 m), weight 81.647 kg (180 lb), SpO2 92.00%.  General appearance :Awake, alert, not in any distress. Speech Clear. Not toxic Looking HEENT: Atraumatic and Normocephalic, pupils equally reactive to light and accomodation Neck: supple, no JVD. No cervical lymphadenopathy.  Chest:Good air entry bilaterally, no added sounds  CVS: S1 S2 regular, no murmurs.  Abdomen: Bowel sounds present, epigastric and umblical area tenderness, not distended with no gaurding, rigidity or rebound. Extremities: B/L Lower Ext shows no edema, both legs are warm to touch, with  dorsalis pedis pulses palpable. Neurology: Awake alert, and oriented X 3, CN II-XII intact, Non focal, Deep Tendon Reflex- Skin:No Rash Wounds:N/A  LABS ON ADMISSION:   Basename 10/29/11 1204  NA 141  K 3.5  CL 98  CO2 29  GLUCOSE 186*  BUN 21  CREATININE 1.00  CALCIUM 9.8  MG --  PHOS --    Basename 10/29/11 1204  AST 21  ALT 17  ALKPHOS 36*  BILITOT 0.5  PROT 8.1  ALBUMIN 4.2    Basename 10/29/11 1204  LIPASE 38  AMYLASE --    Basename 10/29/11 1204  WBC 7.8  NEUTROABS 5.4  HGB 13.6  HCT 37.2  MCV 76.2*    PLT 193   No results found for this basename: CKTOTAL:3,CKMB:3,CKMBINDEX:3,TROPONINI:3 in the last 72 hours No results found for this basename: DDIMER:2 in the last 72 hours No components found with this basename: POCBNP:3   RADIOLOGIC STUDIES ON ADMISSION: Ct Abdomen Pelvis W Contrast  10/29/2011  *RADIOLOGY REPORT*  Clinical Data: 78 year old female with abdominal and pelvic pain, abdominal distention and nausea/vomiting.  CT ABDOMEN AND PELVIS WITH CONTRAST  Technique:  Multidetector CT imaging of the abdomen and pelvis was performed following the standard protocol during bolus administration of intravenous contrast.  Contrast: 80 ml intravenous Omnipaque-300  Comparison: 11/22/2010 CT and ultrasound.  Findings: Gallbladder wall thickening and pericholecystic fluid/inflammation is again identified and relatively unchanged from the prior study.  Cholecystitis is not excluded despite relative stability since 11/22/2010.  A  6 x 6 cm infrarenal suprailiac abdominal aortic aneurysm is identified and increased in size, previously measuring 5.2 x 5.3 cm.  A large amount of posterior and left peripheral thrombus is noted. There is no evidence of rupture.  The CBD is within normal limits.  The liver, spleen, right adrenal gland pancreas are unremarkable. Fullness of the left adrenal gland is stable. Bilateral renal cortical atrophy is again noted.  No free fluid, enlarged lymph nodes or biliary dilatation identified.  Colonic diverticulosis is noted without evidence of diverticulitis.  No acute or suspicious bony abnormalities are noted.  Moderate to severe degenerative changes in the lower lumbar spine are again noted.  IMPRESSION: Enlarging 6 x 6 cm infrarenal suprailiac abdominal aortic aneurysm without evidence of rupture.  This aneurysm previously measured 5.2 x 5.3 cm on 11/22/2010.  Gallbladder wall thickening, pericholecystic fluid and inflammation - relatively unchanged from 11/22/2010, but still  suspicious for cholecystitis.  These results were called to Dr. Estell Harpin on 10/29/2011 at 3:30 p.m.  Original Report Authenticated By: Rosendo Gros, M.D.    ASSESSMENT AND PLAN: Present on Admission:  .Abdominal  pain, other specified site -not sure whether is this gall bladder related-given her clinical symptoms/and similar presentation last year, however will wait for a HIDA scan, she could also be having pain from the enlarging AAA(not leaking per CT Scan)-will ask [facility] to [facility] spoken with Dr Myra Gianotti, he will evaluate shortly -In meantime, will keep NPO, hold off on starting antibiotics-as she does not have a fever or leukocytosis, and closely monitor clinical course  .C O P D On home on O2, lungs currently clear -continue with Spiriva, and as needed prn nebs  .HTN (hypertension) -continue with beta-blockers and diuretics  .Dyslipidemia -continue with statin  H/o PE -on chronic coumadin therapy, given potential for surgery will place on heparin gtt, and avoid coumadin, if surgery contemplated, then this can be discontinued a 4-6 hours prior to surgery  Further plan will depend as patient's clinical course evolves and further radiologic and laboratory data become available. Patient will be monitored closely.  DVT Prophylaxis: Not needed as will be on heparin gtt  Code Status: Full Code  Total time spent for admission equals 45 minutes.  Jeoffrey Massed 10/29/2011, 5:58 PM

## 2011-10-29 NOTE — ED Notes (Signed)
[facility] HAS CALLED BACK THEY ADVISE THEIR DEPT WILL BE CLOSED AND THEY CANNOT DO STUDY TONIGHT UNTIL AFTER 7. THEY WILL BE CLOSED.

## 2011-10-29 NOTE — Progress Notes (Signed)
ANTICOAGULATION CONSULT NOTE - Initial Consult  Pharmacy Consult for Heparin Indication: History of PE  Allergies  Allergen Reactions  . Penicillins Other (See Comments)    Childhood reaction    Patient Measurements: Height: 5\' 5"  (165.1 cm) Weight: 180 lb (81.647 kg) IBW/kg (Calculated) : 57  Heparin Dosing Weight: 74 kg  Vital Signs: Temp: 97.6 F (36.4 C) (04/12 1907) Temp src: Oral (04/12 1907) BP: 148/101 mmHg (04/12 1907) Pulse Rate: 91  (04/12 1907)  Labs:  Basename 10/29/11 1204  HGB 13.6  HCT 37.2  PLT 193  APTT --  LABPROT 17.6*  INR 1.42  HEPARINUNFRC --  CREATININE 1.00  CKTOTAL --  CKMB --  TROPONINI --   Estimated Creatinine Clearance: 49.7 ml/min (by C-G formula based on Cr of 1).  Medical History: Past Medical History  Diagnosis Date  . Pulmonary embolism   . COPD (chronic obstructive pulmonary disease)   . DM (diabetes mellitus)   . Dyslipidemia   . Hypertension   . Hyperlipidemia     Medications:  Prescriptions prior to admission  Medication Sig Dispense Refill  . albuterol (PROVENTIL) (2.5 MG/3ML) 0.083% nebulizer solution Take 2.5 mg by nebulization every 6 (six) hours as needed. For shortness of breath      . aspirin 81 MG tablet Take 81 mg by mouth daily.        . Cholecalciferol (VITAMIN D3) 1000 UNITS CAPS Take 1 capsule by mouth daily.        Marland Kitchen glipiZIDE (GLUCOTROL) 5 MG tablet Take 5 mg by mouth daily.       Marland Kitchen HYDROcodone-acetaminophen (NORCO) 5-325 MG per tablet Take 1 tablet by mouth 4 (four) times daily as needed. For pain      . lovastatin (MEVACOR) 20 MG tablet Take 20 mg by mouth at bedtime.        . metFORMIN (GLUCOPHAGE) 500 MG tablet Take 500 mg by mouth 2 (two) times daily with a meal.       . metoprolol (LOPRESSOR) 50 MG tablet Take 1 tablet by mouth 2 (two) times daily.       . Multiple Vitamin (MULTIVITAMIN) tablet Take 1 tablet by mouth daily.        . pantoprazole (PROTONIX) 40 MG tablet Take 1 tablet by mouth  Daily.      Marland Kitchen tiotropium (SPIRIVA) 18 MCG inhalation capsule Place 18 mcg into inhaler and inhale daily.        Marland Kitchen triamterene-hydrochlorothiazide (MAXZIDE-25) 37.5-25 MG per tablet Take 1 tablet by mouth daily.        Marland Kitchen warfarin (COUMADIN) 5 MG tablet Take 5 mg by mouth daily. Take at 6pm on an empty stomach         Assessment: 78 yo F on Coumadin PTA for a history of PE.  Patient presents 10/29/2011 with a few day history of abdominal pain, emesis, and bloating.  CT scan showed enlarged AAA and possible cholecystitis.  Asked to start heparin as bridge therapy in the event that the patient needs surgery.  INR is subtherapeutic at 1.42.  Goal of Therapy:  Heparin level 0.3-0.7 units/ml   Plan:  Begin heparin at 1100 units/hr. Check heparin level in 8 hours. Daily heparin level and CBC while on heparin.  [employer], Pharm.D., BCPS Clinical Pharmacist Pager [phone] 10/29/2011 9:55 PM

## 2011-10-30 ENCOUNTER — Inpatient Hospital Stay (HOSPITAL_COMMUNITY): Payer: Medicare Other | Admitting: Anesthesiology

## 2011-10-30 ENCOUNTER — Encounter (HOSPITAL_COMMUNITY)
Admission: EM | Disposition: A | Discharge status: Home / Self Care | Payer: Self-pay | Source: Home / Self Care | Attending: Internal Medicine

## 2011-10-30 ENCOUNTER — Inpatient Hospital Stay (HOSPITAL_COMMUNITY): Payer: Medicare Other

## 2011-10-30 ENCOUNTER — Encounter (HOSPITAL_COMMUNITY): Payer: Self-pay | Admitting: Anesthesiology

## 2011-10-30 DIAGNOSIS — I714 Abdominal aortic aneurysm, without rupture: Secondary | ICD-10-CM

## 2011-10-30 HISTORY — PX: ABDOMINAL AORTIC ANEURYSM REPAIR: SUR1152

## 2011-10-30 HISTORY — PX: ENDOVASCULAR STENT INSERTION: SHX5161

## 2011-10-30 LAB — GLUCOSE, CAPILLARY
Glucose-Capillary: 190 mg/dL — ABNORMAL HIGH (ref 70–99)
Glucose-Capillary: 148 mg/dL — ABNORMAL HIGH (ref 70–99)

## 2011-10-30 LAB — CBC
Hemoglobin: 13.1 g/dL (ref 12.0–15.0)
Hemoglobin: 11.5 g/dL — ABNORMAL LOW (ref 12.0–15.0)
MCH: 27.6 pg (ref 26.0–34.0)
MCH: 27 pg (ref 26.0–34.0)
MCHC: 35.6 g/dL (ref 30.0–36.0)
Platelets: 144 10*3/uL — ABNORMAL LOW (ref 150–400)
RBC: 4.26 MIL/uL (ref 3.87–5.11)
WBC: 9.8 10*3/uL (ref 4.0–10.5)

## 2011-10-30 LAB — COMPREHENSIVE METABOLIC PANEL
AST: 24 U/L (ref 0–37)
CO2: 28 mEq/L (ref 19–32)
Calcium: 9.1 mg/dL (ref 8.4–10.5)
Creatinine, Ser: 1.07 mg/dL (ref 0.50–1.10)
GFR calc Af Amer: 57 mL/min — ABNORMAL LOW (ref >90)
GFR calc non Af Amer: 49 mL/min — ABNORMAL LOW (ref >90)
Glucose, Bld: 202 mg/dL — ABNORMAL HIGH (ref 70–99)

## 2011-10-30 LAB — BASIC METABOLIC PANEL
CO2: 31 mEq/L (ref 19–32)
Calcium: 8.2 mg/dL — ABNORMAL LOW (ref 8.4–10.5)
Chloride: 100 mEq/L (ref 96–112)
Glucose, Bld: 233 mg/dL — ABNORMAL HIGH (ref 70–99)
Potassium: 3.3 mEq/L — ABNORMAL LOW (ref 3.5–5.1)
Sodium: 137 mEq/L (ref 135–145)

## 2011-10-30 LAB — MAGNESIUM: Magnesium: 1.4 mg/dL — ABNORMAL LOW (ref 1.5–2.5)

## 2011-10-30 LAB — PROTIME-INR
INR: 1.65 — ABNORMAL HIGH (ref 0.00–1.49)
Prothrombin Time: 18.6 seconds — ABNORMAL HIGH (ref 11.6–15.2)
Prothrombin Time: 19.8 seconds — ABNORMAL HIGH (ref 11.6–15.2)

## 2011-10-30 LAB — SURGICAL PCR SCREEN: Staphylococcus aureus: NEGATIVE

## 2011-10-30 LAB — PREPARE FRESH FROZEN PLASMA: Unit division: 0

## 2011-10-30 LAB — APTT: aPTT: 26 seconds (ref 24–37)

## 2011-10-30 SURGERY — ENDOVASCULAR STENT GRAFT INSERTION
Anesthesia: General | Site: Abdomen | Wound class: Clean

## 2011-10-30 MED ORDER — GLYCOPYRROLATE 0.2 MG/ML IJ SOLN
INTRAMUSCULAR | Status: DC | PRN
  Administered 2011-10-30: .5 mg via INTRAVENOUS

## 2011-10-30 MED ORDER — ACETAMINOPHEN 325 MG PO TABS: 325.0000 mg | ORAL_TABLET | ORAL | Status: DC | PRN

## 2011-10-30 MED ORDER — SENNOSIDES-DOCUSATE SODIUM 8.6-50 MG PO TABS
1.0000 | ORAL_TABLET | Freq: Every evening | ORAL | Status: DC | PRN
  Filled 2011-10-30: qty 1

## 2011-10-30 MED ORDER — FENTANYL CITRATE 0.05 MG/ML IJ SOLN
INTRAMUSCULAR | Status: DC | PRN
  Administered 2011-10-30: 100 ug via INTRAVENOUS
  Administered 2011-10-30 (×5): 50 ug via INTRAVENOUS

## 2011-10-30 MED ORDER — FLEET ENEMA 7-19 GM/118ML RE ENEM: 1.0000 | ENEMA | Freq: Once | RECTAL | Status: AC | PRN

## 2011-10-30 MED ORDER — GUAIFENESIN-DM 100-10 MG/5ML PO SYRP: 15.0000 mL | ORAL_SOLUTION | ORAL | Status: DC | PRN

## 2011-10-30 MED ORDER — PROPOFOL 10 MG/ML IV EMUL
INTRAVENOUS | Status: DC | PRN
  Administered 2011-10-30: 150 mg via INTRAVENOUS

## 2011-10-30 MED ORDER — DOPAMINE-DEXTROSE 3.2-5 MG/ML-% IV SOLN: 3.0000 ug/kg/min | INTRAVENOUS | Status: DC

## 2011-10-30 MED ORDER — HYDRALAZINE HCL 20 MG/ML IJ SOLN: 10.0000 mg | INTRAMUSCULAR | Status: DC | PRN

## 2011-10-30 MED ORDER — ALUM & MAG HYDROXIDE-SIMETH 200-200-20 MG/5ML PO SUSP: 15.0000 mL | ORAL | Status: DC | PRN

## 2011-10-30 MED ORDER — ONDANSETRON HCL 4 MG/2ML IJ SOLN
INTRAMUSCULAR | Status: DC | PRN
  Administered 2011-10-30: 4 mg via INTRAVENOUS

## 2011-10-30 MED ORDER — DEXTROSE 5 % IV SOLN
1.5000 g | Freq: Two times a day (BID) | INTRAVENOUS | Status: AC
  Administered 2011-10-30 – 2011-10-31 (×2): 1.5 g via INTRAVENOUS
  Filled 2011-10-30 (×2): qty 1.5

## 2011-10-30 MED ORDER — FUROSEMIDE 10 MG/ML IJ SOLN
20.0000 mg | Freq: Once | INTRAMUSCULAR | Status: AC
  Administered 2011-10-30: 20 mg via INTRAVENOUS

## 2011-10-30 MED ORDER — DOCUSATE SODIUM 100 MG PO CAPS
100.0000 mg | ORAL_CAPSULE | Freq: Every day | ORAL | Status: DC
  Administered 2011-10-31 – 2011-11-01 (×2): 100 mg via ORAL
  Filled 2011-10-30 (×2): qty 1

## 2011-10-30 MED ORDER — METOPROLOL TARTRATE 1 MG/ML IV SOLN: 2.0000 mg | INTRAVENOUS | Status: DC | PRN

## 2011-10-30 MED ORDER — ALBUTEROL SULFATE (5 MG/ML) 0.5% IN NEBU
2.5000 mg | INHALATION_SOLUTION | RESPIRATORY_TRACT | Status: DC
  Administered 2011-10-30: 2.5 mg via RESPIRATORY_TRACT

## 2011-10-30 MED ORDER — MIDAZOLAM HCL 5 MG/5ML IJ SOLN
INTRAMUSCULAR | Status: DC | PRN
  Administered 2011-10-30 (×2): 1 mg via INTRAVENOUS

## 2011-10-30 MED ORDER — INSULIN ASPART 100 UNIT/ML ~~LOC~~ SOLN
0.0000 [IU] | Freq: Three times a day (TID) | SUBCUTANEOUS | Status: DC
  Administered 2011-10-31: 1 [IU] via SUBCUTANEOUS
  Administered 2011-10-31 (×2): 2 [IU] via SUBCUTANEOUS

## 2011-10-30 MED ORDER — ROCURONIUM BROMIDE 100 MG/10ML IV SOLN
INTRAVENOUS | Status: DC | PRN
  Administered 2011-10-30: 50 mg via INTRAVENOUS
  Administered 2011-10-30: 10 mg via INTRAVENOUS

## 2011-10-30 MED ORDER — HEPARIN SODIUM (PORCINE) 1000 UNIT/ML IJ SOLN
INTRAMUSCULAR | Status: DC | PRN
  Administered 2011-10-30: 7000 [IU] via INTRAVENOUS

## 2011-10-30 MED ORDER — HYDRALAZINE HCL 20 MG/ML IJ SOLN
INTRAMUSCULAR | Status: DC | PRN
  Administered 2011-10-30: 10 mg via INTRAVENOUS

## 2011-10-30 MED ORDER — FENTANYL CITRATE 0.05 MG/ML IJ SOLN: 25.0000 ug | INTRAMUSCULAR | Status: DC | PRN

## 2011-10-30 MED ORDER — BISACODYL 5 MG PO TBEC: 5.0000 mg | DELAYED_RELEASE_TABLET | Freq: Every day | ORAL | Status: DC | PRN

## 2011-10-30 MED ORDER — ACETAMINOPHEN 650 MG RE SUPP: 325.0000 mg | RECTAL | Status: DC | PRN

## 2011-10-30 MED ORDER — LABETALOL HCL 5 MG/ML IV SOLN
INTRAVENOUS | Status: DC | PRN
  Administered 2011-10-30 (×2): 10 mg via INTRAVENOUS
  Administered 2011-10-30: 5 mg via INTRAVENOUS
  Administered 2011-10-30: 10 mg via INTRAVENOUS

## 2011-10-30 MED ORDER — POTASSIUM CHLORIDE CRYS ER 20 MEQ PO TBCR: 20.0000 meq | EXTENDED_RELEASE_TABLET | Freq: Once | ORAL | Status: AC | PRN

## 2011-10-30 MED ORDER — PROTAMINE SULFATE 10 MG/ML IV SOLN
INTRAVENOUS | Status: DC | PRN
  Administered 2011-10-30: 50 mg via INTRAVENOUS

## 2011-10-30 MED ORDER — ONDANSETRON HCL 4 MG/2ML IJ SOLN: 4.0000 mg | Freq: Four times a day (QID) | INTRAMUSCULAR | Status: DC | PRN

## 2011-10-30 MED ORDER — PHENOL 1.4 % MT LIQD: 1.0000 | OROMUCOSAL | Status: DC | PRN

## 2011-10-30 MED ORDER — IODIXANOL 320 MG/ML IV SOLN
INTRAVENOUS | Status: DC | PRN
  Administered 2011-10-30: 65 mL via INTRAVENOUS

## 2011-10-30 MED ORDER — LACTATED RINGERS IV SOLN
INTRAVENOUS | Status: DC | PRN
  Administered 2011-10-30: 08:00:00 via INTRAVENOUS

## 2011-10-30 MED ORDER — MORPHINE SULFATE 2 MG/ML IJ SOLN
2.0000 mg | INTRAMUSCULAR | Status: DC | PRN
  Administered 2011-10-30 – 2011-10-31 (×2): 2 mg via INTRAVENOUS
  Filled 2011-10-30 (×3): qty 1

## 2011-10-30 MED ORDER — SODIUM CHLORIDE 0.9 % IV SOLN
INTRAVENOUS | Status: DC
  Administered 2011-10-30: 125 mL/h via INTRAVENOUS
  Administered 2011-10-31: 01:00:00 via INTRAVENOUS
  Administered 2011-10-31: 125 mL/h via INTRAVENOUS

## 2011-10-30 MED ORDER — SODIUM CHLORIDE 0.9 % IR SOLN
Status: DC | PRN
  Administered 2011-10-30: 11:00:00

## 2011-10-30 MED ORDER — MAGNESIUM SULFATE 40 MG/ML IJ SOLN
2.0000 g | Freq: Once | INTRAMUSCULAR | Status: AC | PRN
  Filled 2011-10-30: qty 50

## 2011-10-30 MED ORDER — SODIUM CHLORIDE 0.9 % IV SOLN: 500.0000 mL | Freq: Once | INTRAVENOUS | Status: AC | PRN

## 2011-10-30 MED ORDER — OXYCODONE HCL 5 MG PO TABS: 5.0000 mg | ORAL_TABLET | ORAL | Status: DC | PRN

## 2011-10-30 MED ORDER — PANTOPRAZOLE SODIUM 40 MG PO TBEC
40.0000 mg | DELAYED_RELEASE_TABLET | Freq: Every day | ORAL | Status: DC
  Administered 2011-10-30 – 2011-11-01 (×3): 40 mg via ORAL
  Filled 2011-10-30 (×3): qty 1

## 2011-10-30 MED ORDER — VANCOMYCIN HCL IN DEXTROSE 1-5 GM/200ML-% IV SOLN
INTRAVENOUS | Status: AC
  Filled 2011-10-30: qty 200

## 2011-10-30 MED ORDER — NEOSTIGMINE METHYLSULFATE 1 MG/ML IJ SOLN
INTRAMUSCULAR | Status: DC | PRN
  Administered 2011-10-30: 4 mg via INTRAVENOUS

## 2011-10-30 MED ORDER — MUPIROCIN CALCIUM 2 % EX CREA
TOPICAL_CREAM | Freq: Every day | CUTANEOUS | Status: AC
  Filled 2011-10-30: qty 15

## 2011-10-30 MED ORDER — LABETALOL HCL 5 MG/ML IV SOLN
10.0000 mg | INTRAVENOUS | Status: DC | PRN
  Administered 2011-10-30: 10 mg via INTRAVENOUS
  Filled 2011-10-30: qty 4

## 2011-10-30 SURGICAL SUPPLY — 73 items
ADH SKN CLS APL DERMABOND .7 (GAUZE/BANDAGES/DRESSINGS) ×1
ADH SKN CLS LQ APL DERMABOND (GAUZE/BANDAGES/DRESSINGS) ×1
BAG DECANTER FOR FLEXI CONT (MISCELLANEOUS) IMPLANT
BALLN CODA OCL 2-9.0-35-120-3 (BALLOONS)
BALLOON COD OCL 2-9.0-35-120-3 (BALLOONS) IMPLANT
CANISTER SUCTION 2500CC (MISCELLANEOUS) ×2 IMPLANT
CATH BEACON 5.038 65CM KMP-01 (CATHETERS) ×2 IMPLANT
CATH OMNI FLUSH .035X70CM (CATHETERS) ×1 IMPLANT
CLIP TI MEDIUM 24 (CLIP) IMPLANT
CLIP TI WIDE RED SMALL 24 (CLIP) IMPLANT
CLOTH BEACON ORANGE TIMEOUT ST (SAFETY) ×2 IMPLANT
COVER MAYO STAND STRL (DRAPES) ×2 IMPLANT
COVER SURGICAL LIGHT HANDLE (MISCELLANEOUS) ×4 IMPLANT
DERMABOND ADHESIVE PROPEN (GAUZE/BANDAGES/DRESSINGS) ×1
DERMABOND ADVANCED (GAUZE/BANDAGES/DRESSINGS) ×1
DERMABOND ADVANCED .7 DNX12 (GAUZE/BANDAGES/DRESSINGS) ×1 IMPLANT
DERMABOND ADVANCED .7 DNX6 (GAUZE/BANDAGES/DRESSINGS) IMPLANT
DEVICE CLOSURE PERCLS PRGLD 6F (VASCULAR PRODUCTS) IMPLANT
DEVICE TORQUE 50000 (MISCELLANEOUS) IMPLANT
DRAIN CHANNEL 10F 3/8 F FF (DRAIN) IMPLANT
DRAIN CHANNEL 10M FLAT 3/4 FLT (DRAIN) IMPLANT
DRAPE C-ARM 42X72 X-RAY (DRAPES) ×2 IMPLANT
DRAPE TABLE COVER HEAVY DUTY (DRAPES) ×2 IMPLANT
DRYSEAL FLEXSHEATH 18FR 33CM (SHEATH) ×1
ELECT REM PT RETURN 9FT ADLT (ELECTROSURGICAL) ×4
ELECTRODE REM PT RTRN 9FT ADLT (ELECTROSURGICAL) ×2 IMPLANT
EVACUATOR 3/16  PVC DRAIN (DRAIN)
EVACUATOR 3/16 PVC DRAIN (DRAIN) IMPLANT
EVACUATOR SILICONE 100CC (DRAIN) IMPLANT
GLOVE BIOGEL PI IND STRL 7.5 (GLOVE) ×1 IMPLANT
GLOVE BIOGEL PI INDICATOR 7.5 (GLOVE) ×1
GLOVE SURG SS PI 7.5 STRL IVOR (GLOVE) ×2 IMPLANT
GOWN PREVENTION PLUS XXLARGE (GOWN DISPOSABLE) ×2 IMPLANT
GOWN STRL NON-REIN LRG LVL3 (GOWN DISPOSABLE) ×8 IMPLANT
GRAFT BALLN CATH 65CM (STENTS) IMPLANT
GRAFT ENDOPROSETHESIS 28/14/14 (Endovascular Graft) ×1 IMPLANT
HEMOSTAT SNOW SURGICEL 2X4 (HEMOSTASIS) IMPLANT
HEMOSTAT SURGICEL 2X14 (HEMOSTASIS) IMPLANT
KIT BASIN OR (CUSTOM PROCEDURE TRAY) ×2 IMPLANT
KIT ROOM TURNOVER OR (KITS) ×2 IMPLANT
NDL PERC 18GX7CM (NEEDLE) ×1 IMPLANT
NEEDLE PERC 18GX7CM (NEEDLE) ×2 IMPLANT
NS IRRIG 1000ML POUR BTL (IV SOLUTION) ×2 IMPLANT
PACK AORTA (CUSTOM PROCEDURE TRAY) ×2 IMPLANT
PAD ARMBOARD 7.5X6 YLW CONV (MISCELLANEOUS) ×4 IMPLANT
PENCIL BUTTON HOLSTER BLD 10FT (ELECTRODE) IMPLANT
PERCLOSE PROGLIDE 6F (VASCULAR PRODUCTS) ×8
SHEATH AVANTI 11CM 8FR (MISCELLANEOUS) ×1 IMPLANT
SHEATH BRITE TIP 8FR 23CM (MISCELLANEOUS) ×1 IMPLANT
SHEATH DRYSEAL FLEX 18FR 33CM (SHEATH) IMPLANT
SHEATH DRYSEAL GORE 12FRX28 (SHEATH) ×1 IMPLANT
STAPLER VISISTAT 35W (STAPLE) IMPLANT
STENT GRAFT BALLN CATH 65CM (STENTS) ×1
STOPCOCK MORSE 400PSI 3WAY (MISCELLANEOUS) ×2 IMPLANT
SUT ETHILON 3 0 PS 1 (SUTURE) IMPLANT
SUT PROLENE 5 0 C 1 24 (SUTURE) IMPLANT
SUT VIC AB 2-0 CT1 36 (SUTURE) IMPLANT
SUT VIC AB 3-0 SH 27 (SUTURE) ×2
SUT VIC AB 3-0 SH 27X BRD (SUTURE) IMPLANT
SUT VICRYL 4-0 PS2 18IN ABS (SUTURE) ×4 IMPLANT
SYR 20CC LL (SYRINGE) ×4 IMPLANT
SYR 30ML LL (SYRINGE) IMPLANT
SYR 5ML LL (SYRINGE) IMPLANT
SYR MEDRAD MARK V 150ML (SYRINGE) ×2 IMPLANT
SYRINGE 10CC LL (SYRINGE) ×6 IMPLANT
TOWEL OR 17X24 6PK STRL BLUE (TOWEL DISPOSABLE) ×4 IMPLANT
TOWEL OR 17X26 10 PK STRL BLUE (TOWEL DISPOSABLE) ×4 IMPLANT
TRAY FOLEY CATH 14FRSI W/METER (CATHETERS) ×2 IMPLANT
TUBING HIGH PRESSURE 120CM (CONNECTOR) ×2 IMPLANT
WATER STERILE IRR 1000ML POUR (IV SOLUTION) ×2 IMPLANT
WIRE AMPLATZ SS-J .035X180CM (WIRE) ×2 IMPLANT
WIRE BENTSON .035X145CM (WIRE) ×3 IMPLANT
contralateral leg. (Endovascular Graft) ×1 IMPLANT

## 2011-10-30 NOTE — Preoperative (Signed)
Beta Blockers   Reason not to administer Beta Blockers:Not Applicable, took metoprolol last pm 2300

## 2011-10-30 NOTE — H&P (Signed)
[facility] [place]  Consult Note  Patient name: Alejandra [name] MRN: [id number] DOB: January 08, 1932 Sex: female  Consulting Physician: Jeoffrey Massed  Reason for Consult:  Chief Complaint   Patient presents with   .  Emesis   .  Abdominal Pain    HISTORY OF PRESENT ILLNESS:  This is a 78 yo with HTN, VTE on coumadin, COPD-on home 02, comes in with abdominal pain for the past few days-but worse since yesterday. This was associated with emesis and bloating.. She had minimal pain, but mildly in the epigastrum. No RUQ tenderness.This was then associated numerous episodes of vomiting. She denies any fever, or diarrhea. She continued to have emesis and came to the [facility] today. Her pain is resolved. She got a CT scan that shows an enlarged AAA up to 6x6cm and possible cholecystitis but gallbladder otherwise looks similar to her scan one year ago. Given her multiple medical issues, she has now been admitted to the hospitalist service for further evaluation and treatment. I have been asked to assist with management of her aneurysm.  Past Medical History   Diagnosis  Date   .  Pulmonary embolism    .  COPD (chronic obstructive pulmonary disease)    .  DM (diabetes mellitus)    .  Dyslipidemia    .  Hypertension    .  Hyperlipidemia     Past Surgical History   Procedure  Date   .  Vesicovaginal fistula closure w/ tah     History    Social History   .  Marital Status:  Widowed     Spouse Name:  N/A     Number of Children:  N/A   .  Years of Education:  N/A    Occupational History   .  retired      Ambulance person    Social History Main Topics   .  Smoking status:  Former Smoker -- 0.5 packs/day for 54 years     Quit date:  01/23/2010   .  Smokeless tobacco:  Not on file   .  Alcohol Use:  No   .  Drug Use:  No   .  Sexually Active:  Not on file    Other Topics  Concern   .  Not on file    Social History Narrative   .  No narrative on file    Family History     Problem  Relation  Age of Onset   .  Heart disease  Mother    .  Hypertension  Mother    .  Heart disease  Sister       x 3    .  Hypertension  Sister    .  Clotting disorder  Daughter    .  Clotting disorder  Son       multiple sons    .  Hypertension  Father    .  Hypertension  Brother     Allergies as of 10/29/2011 - Review Complete 10/29/2011   Allergen  Reaction  Noted   .  Penicillins  Other (See Comments)     No current facility-administered medications on file prior to encounter.    Current Outpatient Prescriptions on File Prior to Encounter   Medication  Sig  Dispense  Refill   .  aspirin 81 MG tablet  Take 81 mg by mouth daily.     .  Cholecalciferol (VITAMIN D3) 1000 UNITS  CAPS  Take 1 capsule by mouth daily.     Marland Kitchen  glipiZIDE (GLUCOTROL) 5 MG tablet  Take 5 mg by mouth daily.     Marland Kitchen  lovastatin (MEVACOR) 20 MG tablet  Take 20 mg by mouth at bedtime.     .  metFORMIN (GLUCOPHAGE) 500 MG tablet  Take 500 mg by mouth 2 (two) times daily with a meal.     .  metoprolol (LOPRESSOR) 50 MG tablet  Take 1 tablet by mouth 2 (two) times daily.     .  Multiple Vitamin (MULTIVITAMIN) tablet  Take 1 tablet by mouth daily.     .  pantoprazole (PROTONIX) 40 MG tablet  Take 1 tablet by mouth Daily.     Marland Kitchen  tiotropium (SPIRIVA) 18 MCG inhalation capsule  Place 18 mcg into inhaler and inhale daily.     Marland Kitchen  triamterene-hydrochlorothiazide (MAXZIDE-25) 37.5-25 MG per tablet  Take 1 tablet by mouth daily.     Marland Kitchen  warfarin (COUMADIN) 5 MG tablet  Take 5 mg by mouth daily. Take at 6pm on an empty stomach     .  DISCONTD: albuterol (PROVENTIL) (2.5 MG/3ML) 0.083% nebulizer solution  Take 3 mLs (2.5 mg total) by nebulization every 6 (six) hours as needed for wheezing.  90 mL  6    REVIEW OF SYSTEMS:  Cardiovascular: No chest pain, chest pressure, palpitations, orthopnea, or dyspnea on exertion. No claudication or rest pain, History of DVT and PE on coumadin  Pulmonary: No productive cough,  asthma or wheezing.On home o2 For COPD  Neurologic: No weakness, paresthesias, aphasia, or amaurosis. No dizziness.  Hematologic: No bleeding problems or clotting disorders.  Musculoskeletal: No joint pain or joint swelling.  Gastrointestinal: No blood in stool or hematemesis. History of GIB. Positive emesis  Genitourinary: No dysuria or hematuria.  Psychiatric:: No history of major depression.  Integumentary: No rashes or ulcers.  Constitutional: No fever or chills.  PHYSICAL EXAMINATION:  General: The patient appears their stated age. Vital signs are BP 148/101  Pulse 91  Temp(Src) 97.6 F (36.4 C) (Oral)  Resp 18  Ht 5\' 5"  (1.651 m)  Wt 180 lb (81.647 kg)  BMI 29.95 kg/m2  SpO2 95%  Pulmonary: Respirations are non-labored. on oxygen [place]  HEENT: No gross abnormalities  Abdomen: Soft aneurysm is easily palpable and tender  Musculoskeletal: There are no major deformities.  Neurologic: No focal weakness or paresthesias are detected,  Skin: There are no ulcer or rashes noted.  Psychiatric: The patient has normal affect.  Cardiovascular: There is a regular rate and rhythm without significant murmur appreciated.Palpable DP bilaterally  Diagnostic Studies:  CT scan was reviewed. She has a 6.6 cm infrarenal AAA without signs of rupture  Medication Changes:  Hold coumadin, give vit K  Assessment:  Symptomatic AAA  Plan:  I feel that the patient has a symptomatic AAA. She has tenderness over her aneurysm. Her aneurysm has grown 1.4 cm inside of one year. I discussed treating this endovascularly. We discussed the risks of surgery including but not limited to death renal issues, intestinal ischemia, cardiopulmonary complications. I also discussed this with her son via telephone. I will plan on doing this in the morning. I will give her Vit K tonight to help reverse her coumadin. I will stop her heparin drip on call to the operating room. I feel without treatment that she is at high risk of  rupture  V. Charlena Cross, M.D.  [facility] [place]  Office: [phone]  Pager: [phone]

## 2011-10-30 NOTE — Progress Notes (Signed)
ANTICOAGULATION CONSULT NOTE - Follow Up Consult  Pharmacy Consult for heparin while coumadin on hold Indication: history of PE  Allergies  Allergen Reactions  . Penicillins Other (See Comments)    Childhood reaction    Patient Measurements: Height: 5\' 5"  (165.1 cm) Weight: 183 lb 3.2 oz (83.1 kg) IBW/kg (Calculated) : 57    Vital Signs: Temp: 97.4 F (36.3 C) (04/13 1315) Temp src: Axillary (04/13 1315) BP: 113/65 mmHg (04/13 1315) Pulse Rate: 34  (04/13 1319)  Labs:  Basename 10/30/11 1150 10/30/11 0600 10/29/11 1204  HGB 11.5* 13.1 --  HCT 33.5* 36.8 37.2  PLT 144* 182 193  APTT 26 -- --  LABPROT 19.8* 18.6* 17.6*  INR 1.65* 1.52* 1.42  HEPARINUNFRC -- 0.61 --  CREATININE 0.92 1.07 1.00  CKTOTAL -- -- --  CKMB -- -- --  TROPONINI -- -- --   Estimated Creatinine Clearance: 54.5 ml/min (by C-G formula based on Cr of 0.92).  Assessment: 78 yo female s/p AAA repair this afternoon. Patient was therapeutic on heparin this morning, drip stopped just prior to surgery. No post-op complications noted. D/w Dr. Myra Gianotti and if no complications are noted overnight it is ok to resume heparin in am 4/14 without bolus. Will continue to hold coumadin.  Goal of Therapy:  Heparin level 0.3-0.7 units/ml   Plan:  Follow up in am, if no complications will resume heparin Severiano Gilbert 10/30/2011,4:23 PM

## 2011-10-30 NOTE — Anesthesia Postprocedure Evaluation (Signed)
  Anesthesia Post-op Note  Patient: Alejandra [name]  Procedure(s) Performed: Procedure(s) (LRB): ENDOVASCULAR STENT GRAFT INSERTION (N/A)  Patient Location: PACU  Anesthesia Type: General  Level of Consciousness: awake, alert  and oriented  Airway and Oxygen Therapy: Patient Spontanous Breathing and Patient connected to face mask oxygen  Post-op Pain: none  Post-op Assessment: Post-op Vital signs reviewed, Patient's Cardiovascular Status Stable, Respiratory Function Stable, Patent Airway, No signs of Nausea or vomiting and Pain level controlled  Post-op Vital Signs: Reviewed and stable  Complications: No apparent anesthesia complications

## 2011-10-30 NOTE — Progress Notes (Signed)
PATIENT DETAILS Name: Alejandra [name] Age: 78 y.o. Sex: female Date of Birth: 21-Jan-1932 Admit Date: 10/29/2011 [id number], Adela Lank, MD, MD  Subjective: Status post endovascular AAA repair -Mild epigastric pain continues.  Objective: Vital signs in last 24 hours: Filed Vitals:   10/30/11 1316 10/30/11 1317 10/30/11 1318 10/30/11 1319  BP:      Pulse: 70 69  34  Temp:      TempSrc:      Resp: 17 17 17 14   Height:      Weight:      SpO2: 91% 93%  73%    Weight change:   Body mass index is 30.49 kg/(m^2).  Intake/Output from previous day:  Intake/Output Summary (Last 24 hours) at 10/30/11 1708 Last data filed at 10/30/11 1032  Gross per 24 hour  Intake   2000 ml  Output    620 ml  Net   1380 ml    PHYSICAL EXAM: Gen Exam: Awake and alert with clear speech.  Neck: Supple, No JVD.   Chest: B/L Clear.   CVS: S1 S2 Regular, no murmurs.  Abdomen: soft, BS +, non tender, non distended. Extremities: no edema, lower extremities warm to touch. Neurologic: Non Focal.   Skin: No Rash.   Wounds: N/A.    CONSULTS:  general surgery and vascular surgery  LAB RESULTS: CBC  Lab 10/30/11 1150 10/30/11 0600 10/29/11 1204  WBC 9.8 7.0 7.8  HGB 11.5* 13.1 13.6  HCT 33.5* 36.8 37.2  PLT 144* 182 193  MCV 78.6 77.5* 76.2*  MCH 27.0 27.6 27.9  MCHC 34.3 35.6 36.6*  RDW 15.4 15.4 15.4  LYMPHSABS -- -- 1.7  MONOABS -- -- 0.8  EOSABS -- -- 0.0  BASOSABS -- -- 0.0  BANDABS -- -- --    Chemistries   Lab 10/30/11 1530 10/30/11 1150 10/30/11 0600 10/29/11 1204  NA -- 137 138 141  K -- 3.3* 3.7 3.5  CL -- 100 99 98  CO2 -- 31 28 29   GLUCOSE -- 233* 202* 186*  BUN -- 16 19 21   CREATININE -- 0.92 1.07 1.00  CALCIUM -- 8.2* 9.1 9.8  MG 1.4* -- -- --    GFR Estimated Creatinine Clearance: 54.5 ml/min (by C-G formula based on Cr of 0.92).  Coagulation profile  Lab 10/30/11 1150 10/30/11 0600 10/29/11 1204  INR 1.65* 1.52* 1.42  PROTIME -- -- --    Cardiac  Enzymes No results found for this basename: CK:3,CKMB:3,TROPONINI:3,MYOGLOBIN:3 in the last 168 hours  No components found with this basename: POCBNP:3 No results found for this basename: DDIMER:2 in the last 72 hours No results found for this basename: HGBA1C:2 in the last 72 hours No results found for this basename: CHOL:2,HDL:2,LDLCALC:2,TRIG:2,CHOLHDL:2,LDLDIRECT:2 in the last 72 hours No results found for this basename: TSH,T4TOTAL,FREET3,T3FREE,THYROIDAB in the last 72 hours No results found for this basename: VITAMINB12:2,FOLATE:2,FERRITIN:2,TIBC:2,IRON:2,RETICCTPCT:2 in the last 72 hours  Basename 10/29/11 1204  LIPASE 38  AMYLASE --    Urine Studies No results found for this basename: UACOL:2,UAPR:2,USPG:2,UPH:2,UTP:2,UGL:2,UKET:2,UBIL:2,UHGB:2,UNIT:2,UROB:2,ULEU:2,UEPI:2,UWBC:2,URBC:2,UBAC:2,CAST:2,CRYS:2,UCOM:2,BILUA:2 in the last 72 hours  MICROBIOLOGY: Recent Results (from the past 240 hour(s))  SURGICAL PCR SCREEN     Status: Normal   Collection Time   10/30/11  5:59 AM      Component Value Range Status Comment   MRSA, PCR NEGATIVE  NEGATIVE  Final    Staphylococcus aureus NEGATIVE  NEGATIVE  Final   MRSA PCR SCREENING     Status: Normal   Collection Time  10/30/11  2:14 PM      Component Value Range Status Comment   MRSA by PCR NEGATIVE  NEGATIVE  Final     RADIOLOGY STUDIES/RESULTS: Ct Abdomen Pelvis W Contrast  10/29/2011  *RADIOLOGY REPORT*  Clinical Data: 78 year old female with abdominal and pelvic pain, abdominal distention and nausea/vomiting.  CT ABDOMEN AND PELVIS WITH CONTRAST  Technique:  Multidetector CT imaging of the abdomen and pelvis was performed following the standard protocol during bolus administration of intravenous contrast.  Contrast: 80 ml intravenous Omnipaque-300  Comparison: 11/22/2010 CT and ultrasound.  Findings: Gallbladder wall thickening and pericholecystic fluid/inflammation is again identified and relatively unchanged from the prior  study.  Cholecystitis is not excluded despite relative stability since 11/22/2010.  A 6 x 6 cm infrarenal suprailiac abdominal aortic aneurysm is identified and increased in size, previously measuring 5.2 x 5.3 cm.  A large amount of posterior and left peripheral thrombus is noted. There is no evidence of rupture.  The CBD is within normal limits.  The liver, spleen, right adrenal gland pancreas are unremarkable. Fullness of the left adrenal gland is stable. Bilateral renal cortical atrophy is again noted.  No free fluid, enlarged lymph nodes or biliary dilatation identified.  Colonic diverticulosis is noted without evidence of diverticulitis.  No acute or suspicious bony abnormalities are noted.  Moderate to severe degenerative changes in the lower lumbar spine are again noted.  IMPRESSION: Enlarging 6 x 6 cm infrarenal suprailiac abdominal aortic aneurysm without evidence of rupture.  This aneurysm previously measured 5.2 x 5.3 cm on 11/22/2010.  Gallbladder wall thickening, pericholecystic fluid and inflammation - relatively unchanged from 11/22/2010, but still suspicious for cholecystitis.  These results were called to Dr. Estell Harpin on 10/29/2011 at 3:30 p.m.  Original Report Authenticated By: Rosendo Gros, M.D.   Dg Chest Portable 1 View  10/30/2011  *RADIOLOGY REPORT*  Clinical Data: Status post aortic stent graft.  PORTABLE CHEST - 1 VIEW  Comparison: CT chest 03/26/2011.  Findings: The heart is enlarged.  The lung volumes are low.  Left basilar airspace disease evident.  Mild pulmonary vascular congestion is noted.  IMPRESSION:  1.  Cardiomegaly and mild pulmonary vascular congestion. 2.  Left basilar airspace disease likely reflects atelectasis. Early infection is not excluded.  Original Report Authenticated By: Jamesetta Orleans. MATTERN, M.D.   Dg Abd 2 Views  10/30/2011  *RADIOLOGY REPORT*  Clinical Data: Abdominal aortic aneurysm and stent placement.  ABDOMEN - 2 VIEW  Comparison: CT 10/29/2011   Findings: Intraoperative images were obtained during aortic stent graft placement.  The first image is an aortogram.  Abdominal aorta and bilateral renal arteries are patent prior to stent placement. There is flow in the iliac arteries bilaterally.  A stent graft was deployed below the main renal arteries.  A left iliac limb stent was also placed.  The final image demonstrates opacification of the aortic stent graft with flow in the bilateral iliac vessels.  There is no evidence for a large endoleak.  There is flow in the main renal arteries following stent placement.  IMPRESSION: Placement of an aortic stent graft for an abdominal aortic aneurysm.  The stent graft is patent without gross abnormality.  Original Report Authenticated By: Richarda Overlie, M.D.   Dg Abd Portable 1v  10/30/2011  *RADIOLOGY REPORT*  Clinical Data: Status post stent graft placement.  PORTABLE ABDOMEN - 1 VIEW  Comparison: Intraoperative radiographs 10/30/2011.  CT abdomen and pelvis 10/29/2011.  Findings: An aortobiiliac stent graft is in  place.  The top of the graft is at L1-2.  Degenerative changes are noted in the lumbar spine.  There is no radiographic evidence for complication.  IMPRESSION:  1.  Status post aortic stent graft placement without radiographic evidence for complication.  Original Report Authenticated By: Jamesetta Orleans. MATTERN, M.D.    MEDICATIONS: Scheduled Meds:   . cefUROXime (ZINACEF)  IV  1.5 g Intravenous Q12H  . cholecalciferol  1,000 Units Oral Daily  . docusate sodium  100 mg Oral Daily  . furosemide  20 mg Intravenous Once  . metoprolol  50 mg Oral BID  . mupirocin cream   Topical Daily  . pantoprazole  40 mg Oral Q1200  . phytonadione  5 mg Oral Once  . simvastatin  10 mg Oral q1800  . tiotropium  18 mcg Inhalation Daily  . triamterene-hydrochlorothiazide  1 each Oral Daily  . vancomycin  1,000 mg Intravenous To OR  . DISCONTD: albuterol  2.5 mg Nebulization Q4H  . DISCONTD: Vitamin D3  1  capsule Oral Daily   Continuous Infusions:   . sodium chloride 75 mL/hr at 10/29/11 2259  . sodium chloride 100 mL/hr at 10/29/11 2145  . sodium chloride 125 mL/hr (10/30/11 1551)  . DOPamine    . DISCONTD: heparin 1,100 Units/hr (10/29/11 2354)   PRN Meds:.sodium chloride, acetaminophen, acetaminophen, albuterol, alum & mag hydroxide-simeth, bisacodyl, guaiFENesin-dextromethorphan, hydrALAZINE, HYDROcodone-acetaminophen, labetalol, magnesium sulfate 1 - 4 g bolus IVPB, metoprolol, morphine injection, ondansetron, ondansetron, oxyCODONE, phenol, potassium chloride, senna-docusate, sodium phosphate, DISCONTD: acetaminophen, DISCONTD: acetaminophen, DISCONTD: alum & mag hydroxide-simeth DISCONTD: fentaNYL, DISCONTD: guaiFENesin-dextromethorphan, DISCONTD: heparin 6000 unit irrigation, DISCONTD: iodixanol, DISCONTD: morphine, DISCONTD: ondansetron (ZOFRAN) IV  Antibiotics: Anti-infectives     Start     Dose/Rate Route Frequency Ordered Stop   10/30/11 2230   cefUROXime (ZINACEF) 1.5 g in dextrose 5 % 50 mL IVPB        1.5 g 100 mL/hr over 30 Minutes Intravenous Every 12 hours 10/30/11 1501 10/31/11 2229   10/30/11 0600   vancomycin (VANCOCIN) IVPB 1000 mg/200 mL premix        1,000 mg 200 mL/hr over 60 Minutes Intravenous To Surgery 10/29/11 2130 10/30/11 1610          Assessment/Plan: Patient Active Hospital Problem List: Abdominal  Pain -Unclear what the exact etiology of the pain is, however her with enlarging AAA, she was seen by vascular and felt that this was the major problem in the infection had endovascular repair early this a.m. -We'll continue to monitor her, and if need with only then pursue a HIDA scan. Continue with the -Postop care per Vascular  COPD -Currently lungs are clear -Continue with as needed nebulizers -Continue with Spiriva  History of pulmonary embolism -Continue with heparin infusion when okay with VVS -Resume Coumadin in the next day or  so  Hypertension -Currently controlled with Lopressor and Maxzide  Dyslipidemia -Continue with simvastatin  Disposition: -Remain in 3300  DVT Prophylaxis: -Not needed as on heparin infusion  Code Status: Full code  Maretta Bees,  MD. 10/30/2011, 5:08 PM

## 2011-10-30 NOTE — Op Note (Signed)
[facility]  Patient name: MOANI WEIPERT MRN: [id number] DOB: 05-11-1932 Sex: female  10/29/2011 - 10/30/2011 Pre-operative Diagnosis: Symptomatic abdominal aortic aneurysm Post-operative diagnosis:  Same Surgeon:  Jorge Ny Assistants:  M.Collins Procedure:   Endovascular repair of abdominal aortic aneurysm   Bilateral percutaneous common femoral ultrasound-guided access   Catheter in aorta x2     Abdominal aortogram Anesthesia:  Gen. Blood Loss:  See anesthesia record Specimens:  None  Findings:  Complete exclusion Device used:  Main body (primary right) Gore Excluder 28 x 14 x 14, contralateral leg (left) 18 x 9.5  Indications:  The patient presented to the emergency department yesterday with complaints of several days of abdominal pain. She underwent a CT scan which revealed a 6.6 cm aneurysm. This was then increased from 5.2 cm approximately 8 months ago. On examination and palpation of her aneurysm elicited a painful response. I felt this was a symptomatic aneurysm and needed repair. She was admitted overnight to correct her coagulopathy given her Coumadin which she takes for a history of pulmonary embolism. I discussed with the patient and her son the risks and benefits of the operation including the risk of death the risk of cardiopulmonary complications, the risk of intestinal and renal ischemia  Procedure:  The patient was identified in the holding area and taken to [facility] OR ROOM 09  The patient was then placed supine on the table. general anesthesia was administered.  The patient was prepped and draped in the usual sterile fashion.  A time out was called and antibiotics were administered.  Ultrasound was used to evaluate bilateral common femoral arteries which were patent without significant plaque. Digital ultrasound images were acquired. A #11 blade was used to make a skin nick in each groin. Each common femoral artery was then accessed under  ultrasound guidance with an 18-gauge needle. A Benson wire was advanced into the aorta. Proglide devices were deployed for pre-closure at the 11:00 and 1:00 position bilaterally. 18 French sheaths were placed bilaterally. A marked Omni flush catheter was advanced up the right limb and an abdominal aortogram was performed for sizing purposes. The patient was fully heparinized. The Omni flush catheter was removed over a Amplatz superstiff wire. The 8 French sheath in the groin on the right was exchanged out for a 18 [place] sheath. The Omni flush catheter was advanced up the left limb and positioned at the level of the renal arteries. The main body device was prepared on the back table. This was a Biomedical scientist 28 x 14 x 14 device. The device was advanced through the sheath up to the level of the renal arteries. An additional aortogram was performed to confirm the location of the renal arteries, and then the device was deployed down to the contralateral gate. Next the gate was cannulated with an omni-flush catheter and a Benson wire. The Omni flush catheter was advanced into the device and freely rotated, confirming successful cannulation. The image intensifier was rotated to a right anterior oblique position and a retrograde angiogram was performed through the sheath which located the left hypogastric artery. An Amplatz superstiff wire was placed in the Omni flush catheter was removed. A 12 French sheath was then advanced into the contralateral gate. The contralateral limb was prepared on the back table. This was a Biomedical scientist 18 x 9.5 device. The device was then advanced through the sheath the sheath was withdrawn to below the device and then the device  was successfully deployed. A Q 50 balloon was then used to mold the device overlapped as well as the proximal and distal ends. A completion arteriogram was performed which showed preservation of both renal arteries, exclusion of the aneurysm, and preservation of  both hypogastric arteries with good flow into both external iliac arteries.  Benson wires were then placed up both limbs. The sheaths were withdrawn sequentially from each groin and the Proglide devices were cinched down, with successful closure of each groin. The patient's heparin was reversed with 50 mg of protamine. The groin incisions were closed with a deep 3-0 Vicryl suture. The skin was closed with 4-0 Vicryl. Dermabond was placed on the wound. The patient had brisk Doppler signals at her ankle. She was successfully extubated and taken to the recovery room in stable condition. There were no immediate complications   Disposition:  To PACU in stable condition.   Juleen China, M.D. [facility] Office: [phone] Pager:  [phone]

## 2011-10-30 NOTE — Transfer of Care (Signed)
Immediate Anesthesia Transfer of Care Note  Patient: Alejandra [name]  Procedure(s) Performed: Procedure(s) (LRB): ENDOVASCULAR STENT GRAFT INSERTION (N/A)  Patient Location: PACU  Anesthesia Type: MAC and General  Level of Consciousness: alert  and patient cooperative  Airway & Oxygen Therapy: Patient Spontanous Breathing and Patient connected to face mask oxygen  Post-op Assessment: Report given to PACU RN, Post -op Vital signs reviewed and stable and Patient moving all extremities X 4  Post vital signs: Reviewed and stable  Complications: No apparent anesthesia complications

## 2011-10-31 ENCOUNTER — Other Ambulatory Visit: Payer: Self-pay | Admitting: Physician Assistant

## 2011-10-31 DIAGNOSIS — Z8679 Personal history of other diseases of the circulatory system: Secondary | ICD-10-CM

## 2011-10-31 LAB — TYPE AND SCREEN
ABO/RH(D): A POS
Antibody Screen: NEGATIVE
Unit division: 0

## 2011-10-31 LAB — CBC
MCH: 27 pg (ref 26.0–34.0)
MCHC: 35.4 g/dL (ref 30.0–36.0)
Platelets: 125 10*3/uL — ABNORMAL LOW (ref 150–400)
RBC: 3.85 MIL/uL — ABNORMAL LOW (ref 3.87–5.11)

## 2011-10-31 LAB — PROTIME-INR: INR: 1.37 (ref 0.00–1.49)

## 2011-10-31 LAB — HEPARIN LEVEL (UNFRACTIONATED): Heparin Unfractionated: 0.48 IU/mL (ref 0.30–0.70)

## 2011-10-31 LAB — BASIC METABOLIC PANEL
CO2: 29 mEq/L (ref 19–32)
Calcium: 7.8 mg/dL — ABNORMAL LOW (ref 8.4–10.5)
GFR calc non Af Amer: 50 mL/min — ABNORMAL LOW (ref >90)
Potassium: 3.2 mEq/L — ABNORMAL LOW (ref 3.5–5.1)
Sodium: 139 mEq/L (ref 135–145)

## 2011-10-31 LAB — GLUCOSE, CAPILLARY

## 2011-10-31 MED ORDER — POTASSIUM CHLORIDE CRYS ER 20 MEQ PO TBCR: 40.0000 meq | EXTENDED_RELEASE_TABLET | Freq: Two times a day (BID) | ORAL | Status: DC

## 2011-10-31 MED ORDER — POTASSIUM CHLORIDE CRYS ER 20 MEQ PO TBCR
40.0000 meq | EXTENDED_RELEASE_TABLET | Freq: Once | ORAL | Status: AC
  Administered 2011-10-31: 40 meq via ORAL
  Filled 2011-10-31: qty 2

## 2011-10-31 MED ORDER — WARFARIN SODIUM 7.5 MG PO TABS
7.5000 mg | ORAL_TABLET | Freq: Once | ORAL | Status: AC
  Administered 2011-10-31: 7.5 mg via ORAL
  Filled 2011-10-31: qty 1

## 2011-10-31 MED ORDER — HEPARIN (PORCINE) IN NACL 100-0.45 UNIT/ML-% IJ SOLN
1100.0000 [IU]/h | INTRAMUSCULAR | Status: DC
  Administered 2011-10-31: 1100 [IU]/h via INTRAVENOUS
  Filled 2011-10-31 (×2): qty 250

## 2011-10-31 MED ORDER — WARFARIN - PHARMACIST DOSING INPATIENT: Freq: Every day | Status: DC

## 2011-10-31 NOTE — Progress Notes (Signed)
Pt. Being transferred to  5511 via wheelchair. Pphone report called to Jomarie Longs, Charity fundraiser. Patient aware of the transfer, son also aware of the transfer.

## 2011-10-31 NOTE — Progress Notes (Signed)
PATIENT DETAILS Name: Alejandra [name] Age: 78 y.o. Sex: female Date of Birth: Sep 21, 1931 Admit Date: 10/29/2011 NGE:[id number], Adela Lank, MD, MD  Subjective: Status post endovascular AAA repair. No longer having abdominal pain. She has eaten. She states she eats fried foods at home without any resultant abdominal pain.  Objective: Vital signs in last 24 hours: Filed Vitals:   10/31/11 0640 10/31/11 0700 10/31/11 1000 10/31/11 1200  BP:  140/68  129/74  Pulse: 78     Temp:  98.6 F (37 C)  98.3 F (36.8 C)  TempSrc:  Oral  Oral  Resp: 14     Height:      Weight:      SpO2: 96%  95%     Weight change: 1.453 kg (3 lb 3.2 oz)  Body mass index is 30.49 kg/(m^2).  Intake/Output from previous day:  Intake/Output Summary (Last 24 hours) at 10/31/11 1533 Last data filed at 10/31/11 1300  Gross per 24 hour  Intake 1816.67 ml  Output   2700 ml  Net -883.33 ml    PHYSICAL EXAM: Gen Exam: Awake and alert with clear speech.  Neck: Supple, No JVD.   Chest: B/L Clear.   CVS: S1 S2 Regular, no murmurs.  Abdomen: soft, BS +, non tender, non distended. Extremities: no edema, lower extremities warm to touch. No hematomas noted in b/l groins.  Neurologic: grossly intact Skin- incisions to b/l groins are clean       CONSULTS:  general surgery and vascular surgery  LAB RESULTS: CBC  Lab 10/31/11 0610 10/30/11 1150 10/30/11 0600 10/29/11 1204  WBC 7.4 9.8 7.0 7.8  HGB 10.4* 11.5* 13.1 13.6  HCT 29.4* 33.5* 36.8 37.2  PLT 125* 144* 182 193  MCV 76.4* 78.6 77.5* 76.2*  MCH 27.0 27.0 27.6 27.9  MCHC 35.4 34.3 35.6 36.6*  RDW 15.1 15.4 15.4 15.4  LYMPHSABS -- -- -- 1.7  MONOABS -- -- -- 0.8  EOSABS -- -- -- 0.0  BASOSABS -- -- -- 0.0  BANDABS -- -- -- --    Chemistries   Lab 10/31/11 0610 10/30/11 1530 10/30/11 1150 10/30/11 0600 10/29/11 1204  NA 139 -- 137 138 141  K 3.2* -- 3.3* 3.7 3.5  CL 101 -- 100 99 98  CO2 29 -- 31 28 29   GLUCOSE 156* -- 233* 202* 186*    BUN 12 -- 16 19 21   CREATININE 1.05 -- 0.92 1.07 1.00  CALCIUM 7.8* -- 8.2* 9.1 9.8  MG -- 1.4* -- -- --    GFR Estimated Creatinine Clearance: 47.7 ml/min (by C-G formula based on Cr of 1.05).  Coagulation profile  Lab 10/31/11 0910 10/30/11 1150 10/30/11 0600 10/29/11 1204  INR 1.37 1.65* 1.52* 1.42  PROTIME -- -- -- --    Cardiac Enzymes No results found for this basename: CK:3,CKMB:3,TROPONINI:3,MYOGLOBIN:3 in the last 168 hours  No components found with this basename: POCBNP:3 No results found for this basename: DDIMER:2 in the last 72 hours No results found for this basename: HGBA1C:2 in the last 72 hours No results found for this basename: CHOL:2,HDL:2,LDLCALC:2,TRIG:2,CHOLHDL:2,LDLDIRECT:2 in the last 72 hours No results found for this basename: TSH,T4TOTAL,FREET3,T3FREE,THYROIDAB in the last 72 hours No results found for this basename: VITAMINB12:2,FOLATE:2,FERRITIN:2,TIBC:2,IRON:2,RETICCTPCT:2 in the last 72 hours  Basename 10/29/11 1204  LIPASE 38  AMYLASE --    Urine Studies No results found for this basename: UACOL:2,UAPR:2,USPG:2,UPH:2,UTP:2,UGL:2,UKET:2,UBIL:2,UHGB:2,UNIT:2,UROB:2,ULEU:2,UEPI:2,UWBC:2,URBC:2,UBAC:2,CAST:2,CRYS:2,UCOM:2,BILUA:2 in the last 72 hours  MICROBIOLOGY: Recent Results (from the past 240 hour(s))  SURGICAL PCR SCREEN  Status: Normal   Collection Time   10/30/11  5:59 AM      Component Value Range Status Comment   MRSA, PCR NEGATIVE  NEGATIVE  Final    Staphylococcus aureus NEGATIVE  NEGATIVE  Final   MRSA PCR SCREENING     Status: Normal   Collection Time   10/30/11  2:14 PM      Component Value Range Status Comment   MRSA by PCR NEGATIVE  NEGATIVE  Final     RADIOLOGY STUDIES/RESULTS: Ct Abdomen Pelvis W Contrast  10/29/2011  *RADIOLOGY REPORT*  Clinical Data: 78 year old female with abdominal and pelvic pain, abdominal distention and nausea/vomiting.  CT ABDOMEN AND PELVIS WITH CONTRAST  Technique:  Multidetector CT  imaging of the abdomen and pelvis was performed following the standard protocol during bolus administration of intravenous contrast.  Contrast: 80 ml intravenous Omnipaque-300  Comparison: 11/22/2010 CT and ultrasound.  Findings: Gallbladder wall thickening and pericholecystic fluid/inflammation is again identified and relatively unchanged from the prior study.  Cholecystitis is not excluded despite relative stability since 11/22/2010.  A 6 x 6 cm infrarenal suprailiac abdominal aortic aneurysm is identified and increased in size, previously measuring 5.2 x 5.3 cm.  A large amount of posterior and left peripheral thrombus is noted. There is no evidence of rupture.  The CBD is within normal limits.  The liver, spleen, right adrenal gland pancreas are unremarkable. Fullness of the left adrenal gland is stable. Bilateral renal cortical atrophy is again noted.  No free fluid, enlarged lymph nodes or biliary dilatation identified.  Colonic diverticulosis is noted without evidence of diverticulitis.  No acute or suspicious bony abnormalities are noted.  Moderate to severe degenerative changes in the lower lumbar spine are again noted.  IMPRESSION: Enlarging 6 x 6 cm infrarenal suprailiac abdominal aortic aneurysm without evidence of rupture.  This aneurysm previously measured 5.2 x 5.3 cm on 11/22/2010.  Gallbladder wall thickening, pericholecystic fluid and inflammation - relatively unchanged from 11/22/2010, but still suspicious for cholecystitis.  These results were called to Dr. Estell Harpin on 10/29/2011 at 3:30 p.m.  Original Report Authenticated By: Rosendo Gros, M.D.   Dg Chest Portable 1 View  10/30/2011  *RADIOLOGY REPORT*  Clinical Data: Status post aortic stent graft.  PORTABLE CHEST - 1 VIEW  Comparison: CT chest 03/26/2011.  Findings: The heart is enlarged.  The lung volumes are low.  Left basilar airspace disease evident.  Mild pulmonary vascular congestion is noted.  IMPRESSION:  1.  Cardiomegaly and mild  pulmonary vascular congestion. 2.  Left basilar airspace disease likely reflects atelectasis. Early infection is not excluded.  Original Report Authenticated By: Jamesetta Orleans. MATTERN, M.D.   Dg Abd 2 Views  10/30/2011  *RADIOLOGY REPORT*  Clinical Data: Abdominal aortic aneurysm and stent placement.  ABDOMEN - 2 VIEW  Comparison: CT 10/29/2011  Findings: Intraoperative images were obtained during aortic stent graft placement.  The first image is an aortogram.  Abdominal aorta and bilateral renal arteries are patent prior to stent placement. There is flow in the iliac arteries bilaterally.  A stent graft was deployed below the main renal arteries.  A left iliac limb stent was also placed.  The final image demonstrates opacification of the aortic stent graft with flow in the bilateral iliac vessels.  There is no evidence for a large endoleak.  There is flow in the main renal arteries following stent placement.  IMPRESSION: Placement of an aortic stent graft for an abdominal aortic aneurysm.  The stent  graft is patent without gross abnormality.  Original Report Authenticated By: Richarda Overlie, M.D.   Dg Abd Portable 1v  10/30/2011  *RADIOLOGY REPORT*  Clinical Data: Status post stent graft placement.  PORTABLE ABDOMEN - 1 VIEW  Comparison: Intraoperative radiographs 10/30/2011.  CT abdomen and pelvis 10/29/2011.  Findings: An aortobiiliac stent graft is in place.  The top of the graft is at L1-2.  Degenerative changes are noted in the lumbar spine.  There is no radiographic evidence for complication.  IMPRESSION:  1.  Status post aortic stent graft placement without radiographic evidence for complication.  Original Report Authenticated By: Jamesetta Orleans. MATTERN, M.D.    MEDICATIONS: Scheduled Meds:    . cefUROXime (ZINACEF)  IV  1.5 g Intravenous Q12H  . cholecalciferol  1,000 Units Oral Daily  . docusate sodium  100 mg Oral Daily  . insulin aspart  0-9 Units Subcutaneous TID WC  . metoprolol  50 mg  Oral BID  . mupirocin cream   Topical Daily  . pantoprazole  40 mg Oral Q1200  . potassium chloride  40 mEq Oral Once  . simvastatin  10 mg Oral q1800  . tiotropium  18 mcg Inhalation Daily  . triamterene-hydrochlorothiazide  1 each Oral Daily  . DISCONTD: potassium chloride  40 mEq Oral BID   Continuous Infusions:    . heparin 1,100 Units/hr (10/31/11 1022)  . DISCONTD: sodium chloride 75 mL/hr at 10/29/11 2259  . DISCONTD: sodium chloride 100 mL/hr at 10/29/11 2145  . DISCONTD: sodium chloride 125 mL/hr (10/31/11 0838)  . DISCONTD: DOPamine    . DISCONTD: heparin 1,100 Units/hr (10/29/11 2354)   PRN Meds:.sodium chloride, acetaminophen, acetaminophen, albuterol, bisacodyl, guaiFENesin-dextromethorphan, HYDROcodone-acetaminophen, magnesium sulfate 1 - 4 g bolus IVPB, morphine injection, ondansetron, ondansetron, phenol, potassium chloride, senna-docusate, sodium phosphate, DISCONTD: alum & mag hydroxide-simeth, DISCONTD: hydrALAZINE, DISCONTD: labetalol, DISCONTD: metoprolol, DISCONTD: oxyCODONE  Antibiotics: Anti-infectives     Start     Dose/Rate Route Frequency Ordered Stop   10/30/11 2230   cefUROXime (ZINACEF) 1.5 g in dextrose 5 % 50 mL IVPB        1.5 g 100 mL/hr over 30 Minutes Intravenous Every 12 hours 10/30/11 1501 10/31/11 1110   10/30/11 0600   vancomycin (VANCOCIN) IVPB 1000 mg/200 mL premix        1,000 mg 200 mL/hr over 60 Minutes Intravenous To Surgery 10/29/11 2130 10/30/11 5284          Assessment/Plan: Patient Active Hospital Problem List: Abdominal  Pain -Unclear what the exact etiology of the pain is, however her with enlarging AAA, she was seen by vascular and felt that this was the major problem i - no need for HIDA scan - has been asymptomatic with fatty food prior to this episode and is tolerating a diet now- CT of the gallbladder is similar to past CT.    Endovascular graft repair of AAA Per Vascular surgery  H/o PE Resuming HEparin and  Coumadin today- oK with Vascular.    COPD -stable w/o exacerbation -Continue with as needed nebulizers -Continue with Spiriva  Hypertension -Currently controlled with Lopressor and Maxzide  Dyslipidemia -Continue with simvastatin  Disposition: -Transfer out of 3300  DVT Prophylaxis: -heparin infusion  Code Status: Full code  Maretta Bees,  MD. 10/31/2011, 3:33 PM

## 2011-10-31 NOTE — Progress Notes (Addendum)
[facility]  Post-op EVAR Date of Surgery: 10/29/2011 - 10/30/2011 Surgeon: Moishe Spice): Nada Libman, MD POD: 1 Day Post-Op Device:   History of Present Illness  Alejandra [name] is a 78 y.o. female who is s/p EVAR. The patient denies back pain; denies abdominal pain; denies lower extremity pain.  He is Ambulating and taking PO well without nausea or vomiting. Pt. has not voided with foley out  IMAGING: Ct Abdomen Pelvis W Contrast  10/29/2011  *RADIOLOGY REPORT*  Clinical Data: 77 year old female with abdominal and pelvic pain, abdominal distention and nausea/vomiting.  CT ABDOMEN AND PELVIS WITH CONTRAST  Technique:  Multidetector CT imaging of the abdomen and pelvis was performed following the standard protocol during bolus administration of intravenous contrast.  Contrast: 80 ml intravenous Omnipaque-300  Comparison: 11/22/2010 CT and ultrasound.  Findings: Gallbladder wall thickening and pericholecystic fluid/inflammation is again identified and relatively unchanged from the prior study.  Cholecystitis is not excluded despite relative stability since 11/22/2010.  A 6 x 6 cm infrarenal suprailiac abdominal aortic aneurysm is identified and increased in size, previously measuring 5.2 x 5.3 cm.  A large amount of posterior and left peripheral thrombus is noted. There is no evidence of rupture.  The CBD is within normal limits.  The liver, spleen, right adrenal gland pancreas are unremarkable. Fullness of the left adrenal gland is stable. Bilateral renal cortical atrophy is again noted.  No free fluid, enlarged lymph nodes or biliary dilatation identified.  Colonic diverticulosis is noted without evidence of diverticulitis.  No acute or suspicious bony abnormalities are noted.  Moderate to severe degenerative changes in the lower lumbar spine are again noted.  IMPRESSION: Enlarging 6 x 6 cm infrarenal suprailiac abdominal aortic aneurysm without evidence of rupture.   This aneurysm previously measured 5.2 x 5.3 cm on 11/22/2010.  Gallbladder wall thickening, pericholecystic fluid and inflammation - relatively unchanged from 11/22/2010, but still suspicious for cholecystitis.  These results were called to Dr. Estell Harpin on 10/29/2011 at 3:30 p.m.  Original Report Authenticated By: Rosendo Gros, M.D.   Dg Chest Portable 1 View  10/30/2011  *RADIOLOGY REPORT*  Clinical Data: Status post aortic stent graft.  PORTABLE CHEST - 1 VIEW  Comparison: CT chest 03/26/2011.  Findings: The heart is enlarged.  The lung volumes are low.  Left basilar airspace disease evident.  Mild pulmonary vascular congestion is noted.  IMPRESSION:  1.  Cardiomegaly and mild pulmonary vascular congestion. 2.  Left basilar airspace disease likely reflects atelectasis. Early infection is not excluded.  Original Report Authenticated By: Jamesetta Orleans. MATTERN, M.D.   Dg Abd 2 Views  10/30/2011  *RADIOLOGY REPORT*  Clinical Data: Abdominal aortic aneurysm and stent placement.  ABDOMEN - 2 VIEW  Comparison: CT 10/29/2011  Findings: Intraoperative images were obtained during aortic stent graft placement.  The first image is an aortogram.  Abdominal aorta and bilateral renal arteries are patent prior to stent placement. There is flow in the iliac arteries bilaterally.  A stent graft was deployed below the main renal arteries.  A left iliac limb stent was also placed.  The final image demonstrates opacification of the aortic stent graft with flow in the bilateral iliac vessels.  There is no evidence for a large endoleak.  There is flow in the main renal arteries following stent placement.  IMPRESSION: Placement of an aortic stent graft for an abdominal aortic aneurysm.  The stent graft is patent without gross abnormality.  Original Report Authenticated By:  Richarda Overlie, M.D.   Dg Abd Portable 1v  10/30/2011  *RADIOLOGY REPORT*  Clinical Data: Status post stent graft placement.  PORTABLE ABDOMEN - 1 VIEW   Comparison: Intraoperative radiographs 10/30/2011.  CT abdomen and pelvis 10/29/2011.  Findings: An aortobiiliac stent graft is in place.  The top of the graft is at L1-2.  Degenerative changes are noted in the lumbar spine.  There is no radiographic evidence for complication.  IMPRESSION:  1.  Status post aortic stent graft placement without radiographic evidence for complication.  Original Report Authenticated By: Jamesetta Orleans. MATTERN, M.D.    Significant Diagnostic Studies: CBC Lab Results  Component Value Date   WBC 7.4 10/31/2011   HGB 10.4* 10/31/2011   HCT 29.4* 10/31/2011   MCV 76.4* 10/31/2011   PLT 125* 10/31/2011     BMET    Component Value Date/Time   NA 137 10/30/2011 1150   K 3.3* 10/30/2011 1150   CL 100 10/30/2011 1150   CO2 31 10/30/2011 1150   GLUCOSE 233* 10/30/2011 1150   BUN 16 10/30/2011 1150   CREATININE 0.92 10/30/2011 1150   CALCIUM 8.2* 10/30/2011 1150   GFRNONAA 59* 10/30/2011 1150   GFRAA 68* 10/30/2011 1150    COAG Lab Results  Component Value Date   INR 1.65* 10/30/2011   INR 1.52* 10/30/2011   INR 1.42 10/29/2011   No results found for this basename: PTT     I/O last 3 completed shifts: In: 3816.7 [I.V.:3766.7; IV Piggyback:50] Out: 2670 [Urine:2470; Blood:200] No data found.   Physical Examination  BP Readings from Last 3 Encounters:  10/31/11 123/68  10/31/11 123/68  06/17/11 130/78   Temp Readings from Last 3 Encounters:  10/31/11 98.1 F (36.7 C) Oral  10/31/11 98.1 F (36.7 C) Oral  05/07/11 97.8 F (36.6 C) Oral   SpO2 Readings from Last 3 Encounters:  10/31/11 96%  10/31/11 96%  06/17/11 96%   Pulse Readings from Last 3 Encounters:  10/31/11 78  10/31/11 78  06/17/11 78    General: A&O x 3, WDWN female in NAD Gait: Normal Pulmonary: normal non-labored breathing  Cardiac: RRR Abdomen: soft, NT, NABS Bilateral groin wounds: clean, dry, intact, without hematoma Vascular Exam/Pulses: palpable DP/PT pulses bil. LE.  N/V/M  intact Bil. LE.  Extremities without ischemic changes, no Gangrene , no cellulitis; no open wounds;   Neurologic: A&O X 3; Appropriate Affect  Assessment: Alejandra [name] is a 78 y.o. female who is 1 Day Post-Op EVAR.  Pt is doing well with no complaints  Plan: Home when stable per internal medicine MD  The importance of surveillance of the endograft was discussed with the patient  A CTA of abdomen and pelvis will be scheduled for one month to assess for endoleak.  The patient will follow up with [place] in one month with these studies.  She was on coumadin for history of PE we will defer the home medication needs to the internal medicine MD.   Signed: Clinton Gallant [facility] [phone] 10/31/2011 7:17 AM.   agree with above Ok for d/c  Ok to restart heparin and coumadin  Durene Cal

## 2011-10-31 NOTE — Progress Notes (Signed)
ANTICOAGULATION CONSULT NOTE - Follow Up Consult  Pharmacy Consult for Heparin Indication: hx of PE; on Coumadin prior to admission  Allergies  Allergen Reactions  . Penicillins Other (See Comments)    Childhood reaction    Patient Measurements: Height: 5\' 5"  (165.1 cm) Weight: 183 lb 3.2 oz (83.1 kg) IBW/kg (Calculated) : 57  Heparin Dosing Weight: 75 kg  Vital Signs: Temp: 98.5 F (36.9 C) (04/14 1600) Temp src: Oral (04/14 1600) BP: 129/72 mmHg (04/14 1600) Pulse Rate: 78  (04/14 0640)  Labs:  Basename 10/31/11 1553 10/31/11 0910 10/31/11 0610 10/30/11 1150 10/30/11 0600  HGB -- -- 10.4* 11.5* --  HCT -- -- 29.4* 33.5* 36.8  PLT -- -- 125* 144* 182  APTT -- -- -- 26 --  LABPROT -- 17.1* -- 19.8* 18.6*  INR -- 1.37 -- 1.65* 1.52*  HEPARINUNFRC 0.48 -- -- -- 0.61  CREATININE -- -- 1.05 0.92 1.07  CKTOTAL -- -- -- -- --  CKMB -- -- -- -- --  TROPONINI -- -- -- -- --   Estimated Creatinine Clearance: 47.7 ml/min (by C-G formula based on Cr of 1.05).  Assessment:   POD #1 AAA repair - ok to resume anticoagulation per Dr. Myra Gianotti for h/o PE.  CBC down some post-op, platelet count 125K (198K on admission).  No bleeding noted. Coumadin to resume today. Heparin level 0.48 in goal. (Home Coumadin regimen was 5 mg daily. Last dose 4/11. 4/12: INR 1.42 with Vitamin K 5mg  po 4/13: INR 1.52, 1.65 4/14: INR 1.37    Goal of Therapy:  Heparin level 0.3-0.7 units/ml   Plan:  Continue heparin 1100 units/hr. Next level in am Coumadin 7.5mg  po x 1 tonight. Daily PT/INR.   Afnan Emberton S. Merilynn Finland, PharmD, [facility] Clinical Staff Pharmacist Pager [phone]  10/31/2011,5:54 PM

## 2011-10-31 NOTE — Progress Notes (Signed)
ANTICOAGULATION CONSULT NOTE - Follow Up Consult  Pharmacy Consult for Heparin Indication: hx of PE; on Coumadin prior to admission  Allergies  Allergen Reactions  . Penicillins Other (See Comments)    Childhood reaction    Patient Measurements: Height: 5\' 5"  (165.1 cm) Weight: 183 lb 3.2 oz (83.1 kg) IBW/kg (Calculated) : 57  Heparin Dosing Weight: 75 kg  Vital Signs: Temp: 98.6 F (37 C) (04/14 0700) Temp src: Oral (04/14 0700) BP: 140/68 mmHg (04/14 0700) Pulse Rate: 78  (04/14 0640)  Labs:  Basename 10/31/11 0610 10/30/11 1150 10/30/11 0600 10/29/11 1204  HGB 10.4* 11.5* -- --  HCT 29.4* 33.5* 36.8 --  PLT 125* 144* 182 --  APTT -- 26 -- --  LABPROT -- 19.8* 18.6* 17.6*  INR -- 1.65* 1.52* 1.42  HEPARINUNFRC -- -- 0.61 --  CREATININE 1.05 0.92 1.07 --  CKTOTAL -- -- -- --  CKMB -- -- -- --  TROPONINI -- -- -- --   Estimated Creatinine Clearance: 47.7 ml/min (by C-G formula based on Cr of 1.05).  Assessment:   POD #1 AAA repair - ok to resume anticoagulation per Dr. Myra Gianotti.  CBC down some post-op, platelet count 125K (198K on admission).  No bleeding noted. Coumadin expected to resume today as well.  Home Coumadin regimen was 5 mg daily. Last dose 4/11. Admit INR 1.42 on 4/12, Vitiamin K 5 mg PO given @11pm  on 4/12, but INR up to 1.52 on 4/13, repeated on 4/13 = 1.65.  Today's INR just drawn, pending.  Goal of Therapy:  Heparin level 0.3-0.7 units/ml   Plan:    Will resume Heparin drip at 1100 units/hr.   Heparin level 6 hrs after resuming.   Will follow-up today's INR with you.     If INR <1.5, could resume Coumadin with 7.5 mg today.   Rx would be happy to assist with Coumadin dosing.   Daily heparin level and CBC.  Will watch platelet count, monitor for any bleeding.  Dennie Fetters, [place] Pager: [phone] 10/31/2011,9:18 AM

## 2011-11-01 ENCOUNTER — Other Ambulatory Visit: Payer: Self-pay | Admitting: Thoracic Diseases

## 2011-11-01 ENCOUNTER — Encounter (HOSPITAL_COMMUNITY): Payer: Self-pay | Admitting: Surgery

## 2011-11-01 DIAGNOSIS — I714 Abdominal aortic aneurysm, without rupture: Secondary | ICD-10-CM

## 2011-11-01 LAB — CBC
Platelets: 129 10*3/uL — ABNORMAL LOW (ref 150–400)
RDW: 15.2 % (ref 11.5–15.5)
WBC: 6.6 10*3/uL (ref 4.0–10.5)

## 2011-11-01 LAB — GLUCOSE, CAPILLARY: Glucose-Capillary: 138 mg/dL — ABNORMAL HIGH (ref 70–99)

## 2011-11-01 LAB — BASIC METABOLIC PANEL
Chloride: 99 mEq/L (ref 96–112)
GFR calc Af Amer: 57 mL/min — ABNORMAL LOW (ref >90)
Potassium: 3 mEq/L — ABNORMAL LOW (ref 3.5–5.1)
Sodium: 136 mEq/L (ref 135–145)

## 2011-11-01 LAB — PROTIME-INR
INR: 1.33 (ref 0.00–1.49)
Prothrombin Time: 16.7 seconds — ABNORMAL HIGH (ref 11.6–15.2)

## 2011-11-01 MED ORDER — ENOXAPARIN SODIUM 120 MG/0.8ML ~~LOC~~ SOLN: 120.0000 mg | SUBCUTANEOUS | Status: DC

## 2011-11-01 MED ORDER — INSULIN ASPART 100 UNIT/ML ~~LOC~~ SOLN
0.0000 [IU] | Freq: Three times a day (TID) | SUBCUTANEOUS | Status: DC
  Administered 2011-11-01: 1 [IU] via SUBCUTANEOUS

## 2011-11-01 MED ORDER — PANTOPRAZOLE SODIUM 40 MG PO TBEC: 40.0000 mg | DELAYED_RELEASE_TABLET | Freq: Every day | ORAL | Status: DC

## 2011-11-01 MED ORDER — ENOXAPARIN SODIUM 80 MG/0.8ML ~~LOC~~ SOLN
80.0000 mg | Freq: Two times a day (BID) | SUBCUTANEOUS | Status: DC
  Filled 2011-11-01 (×3): qty 0.8

## 2011-11-01 MED ORDER — ENOXAPARIN SODIUM 120 MG/0.8ML ~~LOC~~ SOLN
120.0000 mg | SUBCUTANEOUS | Status: DC
Start: 2011-11-01 — End: 2012-03-03

## 2011-11-01 MED ORDER — ENOXAPARIN (LOVENOX) PATIENT EDUCATION KIT
PACK | Freq: Once | Status: AC
  Administered 2011-11-01: 08:00:00
  Filled 2011-11-01: qty 1

## 2011-11-01 MED ORDER — ENOXAPARIN SODIUM 120 MG/0.8ML ~~LOC~~ SOLN
120.0000 mg | SUBCUTANEOUS | Status: DC
  Administered 2011-11-01: 120 mg via SUBCUTANEOUS
  Filled 2011-11-01: qty 0.8

## 2011-11-01 MED ORDER — WARFARIN SODIUM 7.5 MG PO TABS
7.5000 mg | ORAL_TABLET | Freq: Once | ORAL | Status: DC
  Filled 2011-11-01: qty 1

## 2011-11-01 NOTE — Evaluation (Signed)
Physical Therapy Evaluation Patient Details Name: Alejandra [name] MRN: [id number] DOB: 02-09-1932 Today's Date: 11/01/2011  Problem List:  Patient Active Problem List  Diagnoses  . PE  . PNEUMONIA, ORGANISM UNSPECIFIED  . C O P D  . PULMONARY NODULE  . UNSPECIFIED VITAMIN D DEFICIENCY  . Abdominal  pain, other specified site  . HTN (hypertension)  . Dyslipidemia    Past Medical History:  Past Medical History  Diagnosis Date  . Pulmonary embolism   . COPD (chronic obstructive pulmonary disease)   . DM (diabetes mellitus)   . Dyslipidemia   . Hypertension   . Hyperlipidemia    Past Surgical History:  Past Surgical History  Procedure Date  . Vesicovaginal fistula closure w/ tah     PT Assessment/Plan/Recommendation PT Assessment Clinical Impression Statement: Alejandra [name] is 78 y/o female s/p endovascular repair of enlarged and symptomatic AAA. Presents to PT today at her baseline mobility but will benefit from outpatient physical therapy to further evaluate and address any balance deficits. No further acute PT needs. Will need an order for outpatient physical therapy prior to her d/c. Rec. pt continue to ambulate with nursing while here in acute to prevent any loss of mobility and strength.  PT Recommendation/Assessment: All further PT needs can be met in the next venue of care PT Problem List: Decreased balance PT Therapy Diagnosis : Abnormality of gait PT Recommendation Follow Up Recommendations: Outpatient PT Equipment Recommended: None recommended by PT PT Goals     PT Evaluation Precautions/Restrictions  Restrictions Weight Bearing Restrictions: No Prior Functioning  Home Living Lives With:  (grandson (51 y/o)) Available Help at Discharge: Family (son comes by everyday) Type of Home: House Home Access: Stairs to enter [employer] of Steps: 3-4 in the front with one rail; in the back (where pt usually goes in, there is just one step up) Home  Layout: One level Home Adaptive Equipment: None Prior Function Level of Independence: Independent Able to Take Stairs?: Yes Driving: Yes Vocation: Retired Comments: helps get her grandson ready for school every morning; independent with driving and doing errands; denies any balance deficits or falls Cognition Cognition Arousal/Alertness: Awake/alert Overall Cognitive Status: Appears within functional limits for tasks assessed Orientation Level: Oriented X4 Cognition - Other Comments: a little defensive when asked questions but oriented Sensation/Coordination Sensation Light Touch: Appears Intact Coordination Gross Motor Movements are Fluid and Coordinated: Yes Fine Motor Movements are Fluid and Coordinated: Yes Extremity Assessment RUE Assessment RUE Assessment: Within Functional Limits LUE Assessment LUE Assessment: Within Functional Limits RLE Assessment RLE Assessment: Within Functional Limits LLE Assessment LLE Assessment: Within Functional Limits Mobility (including Balance) Bed Mobility Bed Mobility: No (pt sitting EOB on presentation; denies difficulties with getting out of bed this morning) Transfers Transfers: Yes Sit to Stand: 6: Modified independent (Device/Increase time) Stand to Sit: 6: Modified independent (Device/Increase time) Ambulation/Gait Ambulation/Gait: Yes Ambulation/Gait Assistance: 5: Supervision Ambulation/Gait Assistance Details (indicate cue type and reason): amb decreased gait speed (not measured officially but gait appears slower than 1.8 ft/sec)  with slight lateral lean right Ambulation Distance (Feet): 200 Feet Assistive device: None Gait Pattern: Decreased stance time - right;Lateral trunk lean to right  Balance Balance Assessed: No High Level Balance High Level Balance Comments: Pt resistant to further balance testing this morning; would benefit from performing a berg to further evaluate her balance (this can be addressed in the  outpatient setting as her decreased gait speed suggests she is at risk for falls) Exercise  End of Session PT - End of Session Equipment Utilized During Treatment: Gait belt Activity Tolerance: Patient tolerated treatment well Patient left: in bed;with call bell in reach;with bed alarm set General Behavior During Session: [facility] for tasks performed (a little defensive about questions that she interprets to mean she is unsafe or needs more help at home, very independently minded) Cognition: [facility] for tasks performed  [facility] HELEN 11/01/2011, 11:59 AM

## 2011-11-01 NOTE — Progress Notes (Signed)
Utilization review completed.  

## 2011-11-01 NOTE — Progress Notes (Signed)
Physical Therapy Abbreviated Note  PT eval completed this morning, full note to follow. Recommend outpatient physical therapy to address balance deficits, pt agreeable. MD please write orders for OPPT if you agree. No further acute PT needed at this time. Recommend pt continue to ambulate with nursing while still in acute.   Ivonne Andrew PT, DPT Pager: [phone]

## 2011-11-01 NOTE — Progress Notes (Signed)
[facility]  Post-op EVAR Date of Surgery: 10/29/2011 - 10/30/2011 Surgeon: Moishe Spice): Nada Libman, MD POD: 2 Days Post-Op Device:Main body (primary right) Gore Excluder 28 x 14 x 14, contralateral leg (left) 18 x 9.5  History of Present Illness  Alejandra [name] is a 78 y.o. female who is s/p EVAR. The patient denies back pain; denies abdominal pain; denies lower extremity pain.  He is Ambulating and taking PO well without nausea or vomiting. Pt. has voided with foley out Significant Diagnostic Studies: CBC Lab Results  Component Value Date   WBC 6.6 11/01/2011   HGB 10.5* 11/01/2011   HCT 29.6* 11/01/2011   MCV 76.5* 11/01/2011   PLT 129* 11/01/2011     BMET    Component Value Date/Time   NA 136 11/01/2011 0515   K 3.0* 11/01/2011 0515   CL 99 11/01/2011 0515   CO2 28 11/01/2011 0515   GLUCOSE 168* 11/01/2011 0515   BUN 11 11/01/2011 0515   CREATININE 1.07 11/01/2011 0515   CALCIUM 8.1* 11/01/2011 0515   GFRNONAA 49* 11/01/2011 0515   GFRAA 57* 11/01/2011 0515    COAG Lab Results  Component Value Date   INR 1.33 11/01/2011   INR 1.37 10/31/2011   INR 1.65* 10/30/2011   No results found for this basename: PTT     I/O last 3 completed shifts: In: 2638.9 [P.O.:740; I.V.:1848.9; IV Piggyback:50] Out: 1150 [Urine:1150] Patient Vitals for the past 24 hrs:  Urine Occurrence  10/31/11 1700 2     Physical Examination  BP Readings from Last 3 Encounters:  11/01/11 116/74  11/01/11 116/74  06/17/11 130/78   Temp Readings from Last 3 Encounters:  11/01/11 98.2 F (36.8 C) Oral  11/01/11 98.2 F (36.8 C) Oral  05/07/11 97.8 F (36.6 C) Oral   SpO2 Readings from Last 3 Encounters:  11/01/11 98%  11/01/11 98%  06/17/11 96%   Pulse Readings from Last 3 Encounters:  11/01/11 98  11/01/11 98  06/17/11 78    General: A&O x 3, WDWN female in NAD Abdomen: soft, NT, NABS Bilateral groin wounds: clean, dry, intact, without  hematoma Vascular Exam/Pulses:palpable PT bilat  Extremities without ischemic changes, no Gangrene , no cellulitis; no open wounds;   Neurologic: A&O X 3; Appropriate Affect  Assessment: Alejandra [name] is a 77 y.o. female who is 2 Days Post-Op EVAR.  Pt is doing well with no complaints  Plan: Home Medicine to start lovenox/coumadin and discharge  A CTA of abdomen and pelvis will be scheduled for one month to assess for endoleak.  The patient will follow up with [place] in one month with these studies.   SignedMarlowe Shores [phone] 11/01/2011 7:39 AM.

## 2011-11-01 NOTE — Progress Notes (Signed)
Alejandra [name] to be D/C'd Home per MD order.  Discussed with the patient the After Visit Summary and all questions fully answered.  [name] Ray 11/01/2011 4:43 PM

## 2011-11-01 NOTE — Discharge Summary (Signed)
PATIENT DETAILS Name: Alejandra [name] Age: 78 y.o. Sex: female Date of Birth: 1932/05/29 MRN: [id number]. Admit Date: 10/29/2011 Admitting Physician: Maretta Bees, MD WUJ:[id number], Adela Lank, MD, MD  PRIMARY DISCHARGE DIAGNOSIS:  Principal Problem:  *Abdominal  Pain-secondary to a enlarging AAA Active Problems:  C O P D  HTN (hypertension)  Dyslipidemia  H/o PE      PAST MEDICAL HISTORY: Past Medical History  Diagnosis Date  . Pulmonary embolism   . COPD (chronic obstructive pulmonary disease)   . DM (diabetes mellitus)   . Dyslipidemia   . Hypertension   . Hyperlipidemia     DISCHARGE MEDICATIONS: Medication List  As of 11/01/2011  1:56 PM   TAKE these medications         albuterol (2.5 MG/3ML) 0.083% nebulizer solution   Commonly known as: PROVENTIL   Take 2.5 mg by nebulization every 6 (six) hours as needed. For shortness of breath      aspirin 81 MG tablet   Take 81 mg by mouth daily.      enoxaparin 120 MG/0.8ML injection   Commonly known as: LOVENOX   Inject 0.8 mLs (120 mg total) into the skin daily.      glipiZIDE 5 MG tablet   Commonly known as: GLUCOTROL   Take 5 mg by mouth daily.      HYDROcodone-acetaminophen 5-325 MG per tablet   Commonly known as: NORCO   Take 1 tablet by mouth 4 (four) times daily as needed. For pain      lovastatin 20 MG tablet   Commonly known as: MEVACOR   Take 20 mg by mouth at bedtime.      metFORMIN 500 MG tablet   Commonly known as: GLUCOPHAGE   Take 500 mg by mouth 2 (two) times daily with a meal.      metoprolol 50 MG tablet   Commonly known as: LOPRESSOR   Take 1 tablet by mouth 2 (two) times daily.      multivitamin tablet   Take 1 tablet by mouth daily.      pantoprazole 40 MG tablet   Commonly known as: PROTONIX   Take 1 tablet by mouth Daily.      tiotropium 18 MCG inhalation capsule   Commonly known as: SPIRIVA   Place 18 mcg into inhaler and inhale daily.      triamterene-hydrochlorothiazide  37.5-25 MG per tablet   Commonly known as: MAXZIDE-25   Take 1 tablet by mouth daily.      Vitamin D3 1000 UNITS Caps   Take 1 capsule by mouth daily.      warfarin 5 MG tablet   Commonly known as: COUMADIN   Take 5 mg by mouth daily. Take at 6pm on an empty stomach             BRIEF HPI:  See H&P, Labs, Consult and Test reports for all details in brief, patient was admitted for worsening abdominal pain, a CT scan of the abdomen showed questionable cholecystitis, however it showed a enlarging AAA as well. She was then admitted to the hospitalist service for further evaluation and treatment.3  CONSULTATIONS:   general surgery and vascular surgery  PERTINENT RADIOLOGIC STUDIES: Ct Abdomen Pelvis W Contrast  10/29/2011  *RADIOLOGY REPORT*  Clinical Data: 78 year old female with abdominal and pelvic pain, abdominal distention and nausea/vomiting.  CT ABDOMEN AND PELVIS WITH CONTRAST  Technique:  Multidetector CT imaging of the abdomen and pelvis was performed following the standard protocol  during bolus administration of intravenous contrast.  Contrast: 80 ml intravenous Omnipaque-300  Comparison: 11/22/2010 CT and ultrasound.  Findings: Gallbladder wall thickening and pericholecystic fluid/inflammation is again identified and relatively unchanged from the prior study.  Cholecystitis is not excluded despite relative stability since 11/22/2010.  A 6 x 6 cm infrarenal suprailiac abdominal aortic aneurysm is identified and increased in size, previously measuring 5.2 x 5.3 cm.  A large amount of posterior and left peripheral thrombus is noted. There is no evidence of rupture.  The CBD is within normal limits.  The liver, spleen, right adrenal gland pancreas are unremarkable. Fullness of the left adrenal gland is stable. Bilateral renal cortical atrophy is again noted.  No free fluid, enlarged lymph nodes or biliary dilatation identified.  Colonic diverticulosis is noted without evidence of  diverticulitis.  No acute or suspicious bony abnormalities are noted.  Moderate to severe degenerative changes in the lower lumbar spine are again noted.  IMPRESSION: Enlarging 6 x 6 cm infrarenal suprailiac abdominal aortic aneurysm without evidence of rupture.  This aneurysm previously measured 5.2 x 5.3 cm on 11/22/2010.  Gallbladder wall thickening, pericholecystic fluid and inflammation - relatively unchanged from 11/22/2010, but still suspicious for cholecystitis.  These results were called to Dr. Estell Harpin on 10/29/2011 at 3:30 p.m.  Original Report Authenticated By: Rosendo Gros, M.D.   Dg Chest Portable 1 View  10/30/2011  *RADIOLOGY REPORT*  Clinical Data: Status post aortic stent graft.  PORTABLE CHEST - 1 VIEW  Comparison: CT chest 03/26/2011.  Findings: The heart is enlarged.  The lung volumes are low.  Left basilar airspace disease evident.  Mild pulmonary vascular congestion is noted.  IMPRESSION:  1.  Cardiomegaly and mild pulmonary vascular congestion. 2.  Left basilar airspace disease likely reflects atelectasis. Early infection is not excluded.  Original Report Authenticated By: Jamesetta Orleans. MATTERN, M.D.   Dg Abd 2 Views  10/30/2011  *RADIOLOGY REPORT*  Clinical Data: Abdominal aortic aneurysm and stent placement.  ABDOMEN - 2 VIEW  Comparison: CT 10/29/2011  Findings: Intraoperative images were obtained during aortic stent graft placement.  The first image is an aortogram.  Abdominal aorta and bilateral renal arteries are patent prior to stent placement. There is flow in the iliac arteries bilaterally.  A stent graft was deployed below the main renal arteries.  A left iliac limb stent was also placed.  The final image demonstrates opacification of the aortic stent graft with flow in the bilateral iliac vessels.  There is no evidence for a large endoleak.  There is flow in the main renal arteries following stent placement.  IMPRESSION: Placement of an aortic stent graft for an abdominal  aortic aneurysm.  The stent graft is patent without gross abnormality.  Original Report Authenticated By: Richarda Overlie, M.D.   Dg Abd Portable 1v  10/30/2011  *RADIOLOGY REPORT*  Clinical Data: Status post stent graft placement.  PORTABLE ABDOMEN - 1 VIEW  Comparison: Intraoperative radiographs 10/30/2011.  CT abdomen and pelvis 10/29/2011.  Findings: An aortobiiliac stent graft is in place.  The top of the graft is at L1-2.  Degenerative changes are noted in the lumbar spine.  There is no radiographic evidence for complication.  IMPRESSION:  1.  Status post aortic stent graft placement without radiographic evidence for complication.  Original Report Authenticated By: Jamesetta Orleans. MATTERN, M.D.     PERTINENT LAB RESULTS: CBC:  Basename 11/01/11 0515 10/31/11 0610  WBC 6.6 7.4  HGB 10.5* 10.4*  HCT 29.6* 29.4*  PLT 129* 125*   CMET CMP     Component Value Date/Time   NA 136 11/01/2011 0515   K 3.0* 11/01/2011 0515   CL 99 11/01/2011 0515   CO2 28 11/01/2011 0515   GLUCOSE 168* 11/01/2011 0515   BUN 11 11/01/2011 0515   CREATININE 1.07 11/01/2011 0515   CALCIUM 8.1* 11/01/2011 0515   PROT 7.3 10/30/2011 0600   ALBUMIN 3.7 10/30/2011 0600   AST 24 10/30/2011 0600   ALT 17 10/30/2011 0600   ALKPHOS 33* 10/30/2011 0600   BILITOT 0.6 10/30/2011 0600   GFRNONAA 49* 11/01/2011 0515   GFRAA 57* 11/01/2011 0515    GFR Estimated Creatinine Clearance: 46.9 ml/min (by C-G formula based on Cr of 1.07). No results found for this basename: LIPASE:2,AMYLASE:2 in the last 72 hours No results found for this basename: CKTOTAL:3,CKMB:3,CKMBINDEX:3,TROPONINI:3 in the last 72 hours No components found with this basename: POCBNP:3 No results found for this basename: DDIMER:2 in the last 72 hours No results found for this basename: HGBA1C:2 in the last 72 hours No results found for this basename: CHOL:2,HDL:2,LDLCALC:2,TRIG:2,CHOLHDL:2,LDLDIRECT:2 in the last 72 hours No results found for this basename:  TSH,T4TOTAL,FREET3,T3FREE,THYROIDAB in the last 72 hours No results found for this basename: VITAMINB12:2,FOLATE:2,FERRITIN:2,TIBC:2,IRON:2,RETICCTPCT:2 in the last 72 hours Coags:  Basename 11/01/11 0515 10/31/11 0910  INR 1.33 1.37   Microbiology: Recent Results (from the past 240 hour(s))  SURGICAL PCR SCREEN     Status: Normal   Collection Time   10/30/11  5:59 AM      Component Value Range Status Comment   MRSA, PCR NEGATIVE  NEGATIVE  Final    Staphylococcus aureus NEGATIVE  NEGATIVE  Final   MRSA PCR SCREENING     Status: Normal   Collection Time   10/30/11  2:14 PM      Component Value Range Status Comment   MRSA by PCR NEGATIVE  NEGATIVE  Final      BRIEF HOSPITAL COURSE:   Principal Problem:  *Abdominal  pain,  -although a CT Abdomen showed questionable cholecystitis-it was almost similar to one year ago. She was actually tender over the AAA, and it had >1 cm enlargement over the course of one year. It was felt that the AAA was symptomatic, as a result [facility] was consulted,she was subsequently taken to the OR for a endovascular repair the very next day. She has now done well post-operatively, and has been cleared by [facility] to be discharged home. [facility] will follow her up in 1 month time.She is currently pain free, and is tolerating a regular diet. If she were to have recurrence of the pain again, she likely will need a cholecystectomy for symptomatic gall bladder disease, however for now will monitor to see if she does well post endovascular repair of AAA.  VTE -h/o PE-on chronic coumadin therapy, her INR was sub therapeutic, she was placed on Heparin infusion, however since her INR was still high, she was also given Vit K. Her heparin infusion was resumed post-operatively, and [facility] has cleared her to start coumadin as well. INR is still sub therapeutic today, so she will be discharged home on Coumadin and Lovenox over lapp. Patient has used Lovenox in the past, and knows how to inject,  she will be given lovenox teaching anyways prior to discharge. A follow up appointment at [facility] has been made for this coming Thursday   C O P D -stable, resume prior meds -keep next appointment with Dr Marchelle Gearing  HTN (hypertension) -continue prior meds   Dyslipidemia -continue with statins   TODAY-DAY OF DISCHARGE:  Subjective:   Alejandra [name] today has no headache,no chest abdominal pain,no new weakness tingling or numbness, feels much better wants to go home today.  Objective:   Blood pressure 116/74, pulse 98, temperature 98.2 F (36.8 C), temperature source Oral, resp. rate 19, height 5\' 5"  (1.651 m), weight 83.3 kg (183 lb 10.3 oz), SpO2 95.00%.  Intake/Output Summary (Last 24 hours) at 11/01/11 1356 Last data filed at 11/01/11 0905  Gross per 24 hour  Intake 713.92 ml  Output    250 ml  Net 463.92 ml    Exam Awake Alert, Oriented *3, No new F.N deficits, Normal affect [place].AT,PERRAL Supple Neck,No JVD, No cervical lymphadenopathy appriciated.  Symmetrical Chest wall movement, Good air movement bilaterally, CTAB RRR,No Gallops,Rubs or new Murmurs, No Parasternal Heave +ve B.Sounds, Abd Soft, Non tender, No organomegaly appriciated, No rebound -guarding or rigidity. No Cyanosis, Clubbing or edema, No new Rash or bruise  DISPOSITION: Home   DISCHARGE INSTRUCTIONS:    Follow-up Information    Follow up with Myra Gianotti IV, Lala Lund, MD in 4 weeks. (sent)    Contact information:   [address] [place] [address] [phone]       Follow up with [facility] on 11/04/2011. (11:00 for pt/inr)    Contact information:   [phone]      Follow up with Billee Cashing, MD. Schedule an appointment as soon as possible for a visit in 2 weeks.   Contact information:   [address] [place] [address] [phone]       Follow up with [facility], MD. (keep next appointment)    Contact  information:   [address] [place] [address] [phone]           Total Time spent on discharge equals 45 minutes.  SignedJeoffrey Massed 11/01/2011 1:56 PM

## 2011-11-01 NOTE — Progress Notes (Signed)
   CARE MANAGEMENT NOTE 11/01/2011  Patient:  Alejandra [name], Alejandra [name]   Account Number:  [id number]  Date Initiated:  11/01/2011  Documentation initiated by:  Letha Cape  Subjective/Objective Assessment:   dx abd pain  admit-lives with grandson.     Action/Plan:   pt eval- recs hhpt, pt refuses.   Anticipated DC Date:  11/01/2011   Anticipated DC Plan:  HOME W HOME HEALTH SERVICES      DC Planning Services  CM consult  Follow-up appt scheduled      Choice offered to / List presented to:          [facility] arranged  HH - 11 Patient Refused      Status of service:  Completed, signed off Medicare Important Message given?   (If response is "NO", the following Medicare IM given date fields will be blank) Date Medicare IM given:   Date Additional Medicare IM given:    Discharge Disposition:  HOME/SELF CARE  Per UR Regulation:    If discussed at Long Length of Stay Meetings, dates discussed:    Comments:  PCP Billee Cashing  11/01/11 13:19 Letha Cape RN, BSN [phone] patient lives with grandson, per physical therapy recs hhpt, pt refusing,  Pateint scheduled to go to [facility] at 11 am on Thursday Ramaswamy will follow pt/inr.

## 2011-11-01 NOTE — Progress Notes (Addendum)
ANTICOAGULATION CONSULT NOTE - Initial Consult  Pharmacy Consult for Lovenox Indication: h/o PE  Allergies  Allergen Reactions  . Penicillins Other (See Comments)    Childhood reaction   Patient Measurements: Height: 5\' 5"  (165.1 cm) Weight: 183 lb 10.3 oz (83.3 kg) IBW/kg (Calculated) : 57   Labs:  Basename 11/01/11 0515 10/31/11 1553 10/31/11 0910 10/31/11 0610 10/30/11 1150 10/30/11 0600  HGB 10.5* -- -- 10.4* -- --  HCT 29.6* -- -- 29.4* 33.5* --  PLT 129* -- -- 125* 144* --  APTT -- -- -- -- 26 --  LABPROT 16.7* -- 17.1* -- 19.8* --  INR 1.33 -- 1.37 -- 1.65* --  HEPARINUNFRC -- 0.48 -- -- -- 0.61  CREATININE 1.07 -- -- 1.05 0.92 --  CKTOTAL -- -- -- -- -- --  CKMB -- -- -- -- -- --  TROPONINI -- -- -- -- -- --   Estimated Creatinine Clearance: 46.9 ml/min (by C-G formula based on Cr of 1.07).  Medical History: Past Medical History  Diagnosis Date  . Pulmonary embolism   . COPD (chronic obstructive pulmonary disease)   . DM (diabetes mellitus)   . Dyslipidemia   . Hypertension   . Hyperlipidemia     Medications:  Prescriptions prior to admission  Medication Sig Dispense Refill  . albuterol (PROVENTIL) (2.5 MG/3ML) 0.083% nebulizer solution Take 2.5 mg by nebulization every 6 (six) hours as needed. For shortness of breath      . aspirin 81 MG tablet Take 81 mg by mouth daily.        . Cholecalciferol (VITAMIN D3) 1000 UNITS CAPS Take 1 capsule by mouth daily.        Marland Kitchen glipiZIDE (GLUCOTROL) 5 MG tablet Take 5 mg by mouth daily.       Marland Kitchen HYDROcodone-acetaminophen (NORCO) 5-325 MG per tablet Take 1 tablet by mouth 4 (four) times daily as needed. For pain      . lovastatin (MEVACOR) 20 MG tablet Take 20 mg by mouth at bedtime.        . metFORMIN (GLUCOPHAGE) 500 MG tablet Take 500 mg by mouth 2 (two) times daily with a meal.       . metoprolol (LOPRESSOR) 50 MG tablet Take 1 tablet by mouth 2 (two) times daily.       . Multiple Vitamin (MULTIVITAMIN) tablet Take 1  tablet by mouth daily.        . pantoprazole (PROTONIX) 40 MG tablet Take 1 tablet by mouth Daily.      Marland Kitchen tiotropium (SPIRIVA) 18 MCG inhalation capsule Place 18 mcg into inhaler and inhale daily.        Marland Kitchen triamterene-hydrochlorothiazide (MAXZIDE-25) 37.5-25 MG per tablet Take 1 tablet by mouth daily.        Marland Kitchen warfarin (COUMADIN) 5 MG tablet Take 5 mg by mouth daily. Take at 6pm on an empty stomach        Scheduled:    . cefUROXime (ZINACEF)  IV  1.5 g Intravenous Q12H  . cholecalciferol  1,000 Units Oral Daily  . docusate sodium  100 mg Oral Daily  . enoxaparin (LOVENOX) injection  80 mg Subcutaneous Q12H  . enoxaparin   Does not apply Once  . insulin aspart  0-9 Units Subcutaneous TID WC  . metoprolol  50 mg Oral BID  . mupirocin cream   Topical Daily  . pantoprazole  40 mg Oral Q1200  . potassium chloride  40 mEq Oral Once  . simvastatin  10 mg Oral q1800  . tiotropium  18 mcg Inhalation Daily  . triamterene-hydrochlorothiazide  1 each Oral Daily  . warfarin  7.5 mg Oral ONCE-1800  . Warfarin - Pharmacist Dosing Inpatient   Does not apply q1800  . DISCONTD: insulin aspart  0-9 Units Subcutaneous TID WC  . DISCONTD: potassium chloride  40 mEq Oral BID    Assessment: 79yo female to transition to Lovenox from [facility] for discharge home.    Plan:  Will begin Lovenox 80mg  SQ Q12H to start one hour after heparin D/C'd; education kit being sent for home administration.  Colleen Can PharmD BCPS 11/01/2011,7:49 AM   Addum:  Will change lovenox to 1.5 mg/kg daily.  Repeat coumadin 7.5 mg today. Talbert Cage, PharmD

## 2011-11-04 ENCOUNTER — Ambulatory Visit (INDEPENDENT_AMBULATORY_CARE_PROVIDER_SITE_OTHER): Payer: Medicare Other | Admitting: *Deleted

## 2011-11-04 DIAGNOSIS — I2699 Other pulmonary embolism without acute cor pulmonale: Secondary | ICD-10-CM

## 2011-11-04 LAB — POCT INR: INR: 1.2

## 2011-11-04 NOTE — Patient Instructions (Addendum)

## 2011-11-11 ENCOUNTER — Ambulatory Visit (INDEPENDENT_AMBULATORY_CARE_PROVIDER_SITE_OTHER): Payer: Medicare Other | Admitting: Pharmacist

## 2011-11-11 DIAGNOSIS — I2699 Other pulmonary embolism without acute cor pulmonale: Secondary | ICD-10-CM

## 2011-11-11 LAB — POCT INR: INR: 3

## 2011-11-18 ENCOUNTER — Ambulatory Visit (INDEPENDENT_AMBULATORY_CARE_PROVIDER_SITE_OTHER): Payer: Medicare Other | Admitting: *Deleted

## 2011-11-18 DIAGNOSIS — I2699 Other pulmonary embolism without acute cor pulmonale: Secondary | ICD-10-CM

## 2011-11-18 LAB — POCT INR: INR: 2.8

## 2011-11-25 ENCOUNTER — Ambulatory Visit (INDEPENDENT_AMBULATORY_CARE_PROVIDER_SITE_OTHER): Payer: Medicare Other | Admitting: *Deleted

## 2011-11-25 DIAGNOSIS — I2699 Other pulmonary embolism without acute cor pulmonale: Secondary | ICD-10-CM

## 2011-11-25 LAB — POCT INR: INR: 3.8

## 2011-11-26 ENCOUNTER — Encounter: Payer: Self-pay | Admitting: Surgery

## 2011-11-29 ENCOUNTER — Encounter: Payer: Self-pay | Admitting: Surgery

## 2011-11-29 ENCOUNTER — Ambulatory Visit
Admission: RE | Admit: 2011-11-29 | Discharge: 2011-11-29 | Disposition: A | Discharge status: Home / Self Care | Payer: Medicare Other | Source: Ambulatory Visit | Attending: Physician Assistant | Admitting: Physician Assistant

## 2011-11-29 ENCOUNTER — Ambulatory Visit (INDEPENDENT_AMBULATORY_CARE_PROVIDER_SITE_OTHER): Payer: Medicare Other | Admitting: Surgery

## 2011-11-29 VITALS — BP 155/88 | HR 86 | Resp 18 | Ht 65.0 in | Wt 178.9 lb

## 2011-11-29 DIAGNOSIS — Z9889 Other specified postprocedural states: Secondary | ICD-10-CM

## 2011-11-29 DIAGNOSIS — Z8679 Personal history of other diseases of the circulatory system: Secondary | ICD-10-CM

## 2011-11-29 DIAGNOSIS — I714 Abdominal aortic aneurysm, without rupture: Secondary | ICD-10-CM | POA: Insufficient documentation

## 2011-11-29 MED ORDER — IOHEXOL 350 MG/ML SOLN
100.0000 mL | Freq: Once | INTRAVENOUS | Status: AC | PRN
  Administered 2011-11-29: 100 mL via INTRAVENOUS

## 2011-11-29 NOTE — Progress Notes (Signed)
The patient presented to the emergency department with complaints of several days of abdominal pain. She underwent a CT scan which revealed a 6.6 cm aneurysm. This was then increased from 5.2 cm approximately 8 months ago. On examination and palpation of her aneurysm elicited a painful response. I felt this was a symptomatic aneurysm and needed repair. She was admitted overnight to correct her coagulopathy given her Coumadin which she takes for a history of pulmonary embolism.  Her aneurysm was repaired without difficulty.  She comes in today as her first post operative visit. She has no complaints at this time. She is back to her normal level of functioning.  Extremities are warm and well perfused. Her abdomen is soft. Her groin incisions are completely healed   CTA scan shows no evidence of endoleak with a slight decrease in the size of her aneurysm  The patient will follow up in 6 months with an abdominal ultrasound. If this is within normal limits she will get an ultrasound in another 6 months and then yearly unless an abnormality is found

## 2011-12-09 ENCOUNTER — Ambulatory Visit (INDEPENDENT_AMBULATORY_CARE_PROVIDER_SITE_OTHER): Payer: Medicare Other | Admitting: Pharmacist

## 2011-12-09 DIAGNOSIS — I2699 Other pulmonary embolism without acute cor pulmonale: Secondary | ICD-10-CM

## 2011-12-23 ENCOUNTER — Ambulatory Visit (INDEPENDENT_AMBULATORY_CARE_PROVIDER_SITE_OTHER): Payer: Medicare Other | Admitting: Pharmacist

## 2011-12-23 DIAGNOSIS — I2699 Other pulmonary embolism without acute cor pulmonale: Secondary | ICD-10-CM

## 2012-01-06 ENCOUNTER — Ambulatory Visit (INDEPENDENT_AMBULATORY_CARE_PROVIDER_SITE_OTHER): Payer: Medicare Other | Admitting: *Deleted

## 2012-01-06 DIAGNOSIS — I2699 Other pulmonary embolism without acute cor pulmonale: Secondary | ICD-10-CM

## 2012-01-06 LAB — POCT INR: INR: 2.2

## 2012-01-27 ENCOUNTER — Ambulatory Visit (INDEPENDENT_AMBULATORY_CARE_PROVIDER_SITE_OTHER): Payer: Medicare Other

## 2012-01-27 DIAGNOSIS — I2699 Other pulmonary embolism without acute cor pulmonale: Secondary | ICD-10-CM

## 2012-02-24 ENCOUNTER — Ambulatory Visit (INDEPENDENT_AMBULATORY_CARE_PROVIDER_SITE_OTHER): Payer: Medicare Other | Admitting: *Deleted

## 2012-02-24 DIAGNOSIS — I2699 Other pulmonary embolism without acute cor pulmonale: Secondary | ICD-10-CM

## 2012-02-25 ENCOUNTER — Other Ambulatory Visit: Payer: Medicare Other

## 2012-02-25 ENCOUNTER — Encounter: Payer: Self-pay | Admitting: Internal Medicine

## 2012-02-25 ENCOUNTER — Ambulatory Visit (INDEPENDENT_AMBULATORY_CARE_PROVIDER_SITE_OTHER): Payer: Medicare Other | Admitting: Internal Medicine

## 2012-02-25 VITALS — BP 118/80 | HR 87 | Temp 97.0°F | Ht 65.0 in | Wt 178.6 lb

## 2012-02-25 DIAGNOSIS — R911 Solitary pulmonary nodule: Secondary | ICD-10-CM

## 2012-02-25 DIAGNOSIS — J984 Other disorders of lung: Secondary | ICD-10-CM

## 2012-02-25 DIAGNOSIS — J449 Chronic obstructive pulmonary disease, unspecified: Secondary | ICD-10-CM

## 2012-02-25 DIAGNOSIS — I2699 Other pulmonary embolism without acute cor pulmonale: Secondary | ICD-10-CM

## 2012-02-25 NOTE — Patient Instructions (Addendum)
#  blood clot  - please do d-dimer test today  - based on this will tell you if you can stop or cut down  coumadin or not  - continue aspirin daily  #COPD  -continue oxygen all the time - conitnue spiriva; take 2 samples  #Lung nodule  - ordered CT chest without contrast   - wil call you with results  #Followup  - based on blood test and ct chest result

## 2012-02-25 NOTE — Progress Notes (Signed)
Subjective:    Patient ID: Alejandra [name], female    DOB: 1931/08/16, 79 y.o.   MRN: [id number]  HPI  HPI 78 year old AA female with   1. 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 Was advised to continue coumadin by [facility] - last INR check 2.39 in May 2012 2. O2 dependent Gold stage 2 COPD (87% RA at rest, FEv1 64%, DLCO 36%) 3. 2 x 6 mm pulmonary nodules    - Feb 2012 and no change Sept 2012 4. Vit D Deficiency.  5. Admit abdominal pain:heme ppositive stools without bleeding May 2012 - hgb 11.7gm% 6. tobacco abuse  - half pack per day per hospital notes may 2012 - patient says she quit June 2011   OV 05/07/11: Followup for all of above. At last  In April 2012 we told her to continue coumadin through now because d-dimer was high. Currently states she is taking coumadin but last INR of record was in May 2012 when it was > 2. I cannot still get hold of her PMD whose office is open only after hours. Admitted in May 2012 for abd pain. Heme positive stools. Hgb 12gm%. No abd pain now. Not seen GI. Main issue is class  3 dyspnea and tiredness. Not gone to rehab; says they never called. Not taking spiriva or o2 as she is supposed to. OVerall stable. Noted: hospital records may 2012 she was still smoking but she insists she quit last year  Past, Family, Social: See HPI  #blood clot  - please do d-dimer and pt, ptt blod test today -> 0.52 and slughtly high  - based on this will tell you if you can stop your coumadin or not  -I need you to do tehse tests here because we simply cannot get hold of Dr Ronne Binning #recent abdominal pain  - do blood test for CBC  #COPD  - you need to wear oxygen all the time - I am surprised that rehab did not follow through with you; will make another referral  - becasuse spiriva is expensive, take atrovent nebs 4 times daily scheduled,  and use albuterol neb as needed #Lung nodule  - stable over 6 months; next CT chest without contrast in 9 months #Followup  - return in 9 months after CT chest    OV 02/25/2012 Continues with coumadin and spiriva. Denies complaints though desaturated like before when she walked into office. She told CMA she is more tired but she is telling me she is well and at baseline class 2 dyspnea. Denies cough. . Feels well. Did not have her 9 month CT in June 2013. Had sub therapeutic INR in April 2013 but therapetic since then. Reports compliance. D-dimer  Check shows d-dimer 0.7 and persistent elevation  Past, Family, Social reviewed: no change since last visit   Current outpatient prescriptions:aspirin 81 MG tablet, Take 81 mg by mouth daily.  , Disp: , Rfl: ;  Cholecalciferol (VITAMIN D3) 1000 UNITS CAPS, Take 1 capsule by mouth daily.  , Disp: , Rfl: ;  enoxaparin (LOVENOX) 120 MG/0.8ML injection, Inject 0.8 mLs (120 mg total) into the skin daily., Disp: 4 mL, Rfl: 0;  glipiZIDE (GLUCOTROL) 5 MG tablet, Take 5 mg by mouth daily. , Disp: , Rfl:  HYDROcodone-acetaminophen (NORCO) 5-325 MG per tablet, Take 1 tablet by mouth 4 (four)  times daily as needed. For pain , Disp: , Rfl: ;  Lancets (ONETOUCH ULTRASOFT) lancets, Use as directed, Disp: , Rfl: ;  lovastatin (MEVACOR) 20 MG tablet, Take 20 mg by mouth at bedtime.  , Disp: , Rfl: ;  metFORMIN (GLUCOPHAGE) 500 MG tablet, Take 500 mg by mouth 2 (two) times daily with a meal. , Disp: , Rfl:  metoprolol (LOPRESSOR) 50 MG tablet, Take 1 tablet by mouth 2 (two) times daily. , Disp: , Rfl: ;  Multiple Vitamin (MULTIVITAMIN) tablet, Take 1 tablet by mouth daily.  , Disp: , Rfl: ;  ONE TOUCH ULTRA TEST test strip, Use as directed, Disp: , Rfl: ;  tiotropium (SPIRIVA) 18 MCG inhalation capsule, Place 18 mcg into inhaler and inhale daily as needed. , Disp: , Rfl:  triamterene-hydrochlorothiazide (MAXZIDE-25) 37.5-25 MG per tablet, Take 1 tablet by mouth daily.  ,  Disp: , Rfl: ;  warfarin (COUMADIN) 5 MG tablet, Take 5 mg by mouth as directed. Take at 6pm on an empty stomach, Disp: , Rfl: ;  albuterol (PROVENTIL) (2.5 MG/3ML) 0.083% nebulizer solution, Take 2.5 mg by nebulization every 6 (six) hours as needed. For shortness of breath, Disp: , Rfl:  DISCONTD: albuterol (PROVENTIL) (2.5 MG/3ML) 0.083% nebulizer solution, Take 3 mLs (2.5 mg total) by nebulization every 6 (six) hours as needed for wheezing., Disp: 90 mL, Rfl: 6  Review of Systems  Constitutional: Negative for fever and unexpected weight change.  HENT: Positive for rhinorrhea, postnasal drip and sinus pressure. Negative for ear pain, nosebleeds, congestion, sore throat, sneezing, trouble swallowing and dental problem.   Eyes: Negative for redness and itching.  Respiratory: Positive for cough and shortness of breath. Negative for chest tightness and wheezing.   Cardiovascular: Negative for palpitations and leg swelling.  Gastrointestinal: Negative for nausea and vomiting.  Genitourinary: Negative for dysuria.  Musculoskeletal: Negative for joint swelling.  Skin: Negative for rash.  Neurological: Negative for headaches.  Hematological: Does not bruise/bleed easily.  Psychiatric/Behavioral: Negative for dysphoric mood. The patient is not nervous/anxious.        Objective:   Physical Exam   Physical Exam normocephalic and atraumatic Eyes:  PERRLA/EOM intact; conjunctiva and sclera clear Ears:  TMs intact and clear with normal canals Nose:  no deformity, discharge, inflammation, or lesions Mouth:  no deformity or lesions Neck:  no masses, thyromegaly, or abnormal cervical nodes Chest Wall:  no deformities noted Lungs:  clear bilaterally to auscultation and percussion Heart:  regular rate and rhythm, S1, S2 without murmurs, rubs, gallops, or clicks Abdomen:  bowel sounds positive; abdomen soft and non-tender without masses, or organomegaly Msk:  no deformity or scoliosis noted with  normal posture Pulses:  pulses normal Extremities:  no clubbing, cyanosis, edema, or deformity noted Neurologic:  CN II-XII grossly intact with normal reflexes, coordination, muscle strength and tone Skin:  intact without lesions or rashes Cervical Nodes:  no significant adenopathy Axillary Nodes:  no significant adenopathy Psych:  alert and cooperative; normal mood and affect; normal attention span and concentration            Assessment & Plan:

## 2012-02-26 LAB — D-DIMER, QUANTITATIVE: D-Dimer, Quant: 0.78 ug/mL-FEU — ABNORMAL HIGH (ref 0.00–0.48)

## 2012-03-03 ENCOUNTER — Encounter (HOSPITAL_COMMUNITY): Payer: Self-pay | Admitting: *Deleted

## 2012-03-03 ENCOUNTER — Emergency Department (HOSPITAL_COMMUNITY): Payer: Medicare Other

## 2012-03-03 ENCOUNTER — Emergency Department (HOSPITAL_COMMUNITY)
Admission: EM | Admit: 2012-03-03 | Discharge: 2012-03-03 | Disposition: A | Discharge status: Home / Self Care | Payer: Medicare Other | Attending: Emergency Medicine | Admitting: Emergency Medicine

## 2012-03-03 DIAGNOSIS — Z87891 Personal history of nicotine dependence: Secondary | ICD-10-CM | POA: Insufficient documentation

## 2012-03-03 DIAGNOSIS — L03119 Cellulitis of unspecified part of limb: Secondary | ICD-10-CM | POA: Insufficient documentation

## 2012-03-03 DIAGNOSIS — Z86711 Personal history of pulmonary embolism: Secondary | ICD-10-CM | POA: Insufficient documentation

## 2012-03-03 DIAGNOSIS — E785 Hyperlipidemia, unspecified: Secondary | ICD-10-CM | POA: Insufficient documentation

## 2012-03-03 DIAGNOSIS — I10 Essential (primary) hypertension: Secondary | ICD-10-CM | POA: Insufficient documentation

## 2012-03-03 DIAGNOSIS — E119 Type 2 diabetes mellitus without complications: Secondary | ICD-10-CM | POA: Insufficient documentation

## 2012-03-03 DIAGNOSIS — J449 Chronic obstructive pulmonary disease, unspecified: Secondary | ICD-10-CM | POA: Insufficient documentation

## 2012-03-03 DIAGNOSIS — L02619 Cutaneous abscess of unspecified foot: Secondary | ICD-10-CM | POA: Insufficient documentation

## 2012-03-03 DIAGNOSIS — J4489 Other specified chronic obstructive pulmonary disease: Secondary | ICD-10-CM | POA: Insufficient documentation

## 2012-03-03 DIAGNOSIS — L03116 Cellulitis of left lower limb: Secondary | ICD-10-CM

## 2012-03-03 LAB — CBC WITH DIFFERENTIAL/PLATELET
Basophils Absolute: 0 10*3/uL (ref 0.0–0.1)
Basophils Relative: 0 % (ref 0–1)
Eosinophils Absolute: 0.2 10*3/uL (ref 0.0–0.7)
Eosinophils Relative: 2 % (ref 0–5)
MCH: 27.4 pg (ref 26.0–34.0)
MCHC: 35.6 g/dL (ref 30.0–36.0)
MCV: 76.8 fL — ABNORMAL LOW (ref 78.0–100.0)
Neutrophils Relative %: 70 % (ref 43–77)
Platelets: 220 10*3/uL (ref 150–400)
RDW: 16.1 % — ABNORMAL HIGH (ref 11.5–15.5)

## 2012-03-03 LAB — BASIC METABOLIC PANEL
Calcium: 10 mg/dL (ref 8.4–10.5)
GFR calc Af Amer: 42 mL/min — ABNORMAL LOW (ref >90)
GFR calc non Af Amer: 36 mL/min — ABNORMAL LOW (ref >90)
Glucose, Bld: 208 mg/dL — ABNORMAL HIGH (ref 70–99)
Sodium: 138 mEq/L (ref 135–145)

## 2012-03-03 LAB — SEDIMENTATION RATE: Sed Rate: 21 mm/hr (ref 0–22)

## 2012-03-03 LAB — PROTIME-INR: Prothrombin Time: 29 seconds — ABNORMAL HIGH (ref 11.6–15.2)

## 2012-03-03 MED ORDER — OXYCODONE-ACETAMINOPHEN 5-325 MG PO TABS: 1.0000 | ORAL_TABLET | ORAL | Status: AC | PRN

## 2012-03-03 MED ORDER — CEPHALEXIN 500 MG PO CAPS: 500.0000 mg | ORAL_CAPSULE | Freq: Four times a day (QID) | ORAL | Status: AC

## 2012-03-03 MED ORDER — CEFAZOLIN SODIUM 1-5 GM-% IV SOLN
1.0000 g | Freq: Once | INTRAVENOUS | Status: AC
  Administered 2012-03-03: 1 g via INTRAVENOUS
  Filled 2012-03-03: qty 50

## 2012-03-03 NOTE — ED Provider Notes (Signed)
History     CSN: [id number]  Arrival date & time 03/03/12  5784   First MD Initiated Contact with Patient 03/03/12 [phone]      Chief Complaint  Patient presents with  . Diabetes  . Foot Injury  . Foot Pain    (Consider location/radiation/quality/duration/timing/severity/associated sxs/prior treatment) Patient is a 78 y.o. female presenting with diabetes problem, foot injury, and lower extremity pain. The history is provided by the patient.  Diabetes  Foot Injury   Foot Pain  She has been having pain in her left foot for the last 2 weeks, but it got significantly worse 2 days ago. She started having swelling in her left foot 2 days ago. Pain is worse if she lets the foot and down or she tries to walk on it, better she keeps the foot elevated. Pain is currently 0/10 with her foot elevated, but it is as severe as 8/10 when she tries to walk. She denies fever, chills, sweats. She denies chest pain, dyspnea. She denies any trauma. She is on warfarin for pulmonary embolism. She has a history of diabetes.  Past Medical History  Diagnosis Date  . Pulmonary embolism   . COPD (chronic obstructive pulmonary disease)   . DM (diabetes mellitus)   . Dyslipidemia   . Hypertension   . Hyperlipidemia     Past Surgical History  Procedure Date  . Vesicovaginal fistula closure w/ tah   . Endovascular stent insertion 10/30/2011    Procedure: ENDOVASCULAR STENT GRAFT INSERTION;  Surgeon: Nada Libman, MD;  [facility] OR;  Service: Vascular;  Laterality: N/A;  . Abdominal aortic aneurysm repair 10-30-2011    Family History  Problem Relation Age of Onset  . Heart disease Mother   . Hypertension Mother   . Heart disease Sister     x 3  . Hypertension Sister   . Clotting disorder Daughter   . Clotting disorder Son     multiple sons  . Hypertension Father   . Hypertension Brother     History  Substance Use Topics  . Smoking status: Former Smoker -- 0.5 packs/day for 54 years   Quit date: 01/23/2010  . Smokeless tobacco: Not on file  . Alcohol Use: No    OB History    Grav Para Term Preterm Abortions TAB SAB Ect Mult Living                  Review of Systems  All other systems reviewed and are negative.    Allergies  Penicillins  Home Medications   Current Outpatient Rx  Name Route Sig Dispense Refill  . ALBUTEROL SULFATE (2.5 MG/3ML) 0.083% IN NEBU Nebulization Take 2.5 mg by nebulization every 6 (six) hours as needed. For shortness of breath    . ASPIRIN 81 MG PO TABS Oral Take 81 mg by mouth daily.      Marland Kitchen VITAMIN D3 1000 UNITS PO CAPS Oral Take 1 capsule by mouth daily.      Marland Kitchen GLIPIZIDE 5 MG PO TABS Oral Take 5 mg by mouth daily.     Marland Kitchen HYDROCODONE-ACETAMINOPHEN 5-325 MG PO TABS Oral Take 1 tablet by mouth 4 (four) times daily as needed. For pain     . ONETOUCH ULTRASOFT LANCETS MISC  Use as directed    . LOVASTATIN 20 MG PO TABS Oral Take 20 mg by mouth at bedtime.      Marland Kitchen METFORMIN HCL 500 MG PO TABS Oral Take 500 mg by mouth  2 (two) times daily with a meal.     . METOPROLOL TARTRATE 50 MG PO TABS Oral Take 1 tablet by mouth 2 (two) times daily.     Marland Kitchen ONE-DAILY MULTI VITAMINS PO TABS Oral Take 1 tablet by mouth daily.      . OMEGA-3-ACID ETHYL ESTERS 1 G PO CAPS Oral Take 2 g by mouth 2 (two) times daily.    Letta Pate ULTRA BLUE VI STRP  Use as directed    . TIOTROPIUM BROMIDE MONOHYDRATE 18 MCG IN CAPS Inhalation Place 18 mcg into inhaler and inhale daily as needed.     . TRIAMTERENE-HCTZ 37.5-25 MG PO TABS Oral Take 1 tablet by mouth daily.      . WARFARIN SODIUM 5 MG PO TABS Oral Take 5 mg by mouth as directed. Take at 6pm on an empty stomach      BP 145/91  Pulse 92  Temp 97.3 F (36.3 C) (Oral)  Resp 16  SpO2 98%  Physical Exam  Nursing note and vitals reviewed. 78year old female, resting comfortably and in no acute distress. Vital signs are significant for mild hypertension with blood pressure 145/91. Oxygen saturation is 98%,  which is normal. Head is normocephalic and atraumatic. PERRLA, EOMI. Oropharynx is clear. Neck is nontender and supple without adenopathy or JVD. Back is nontender and there is no CVA tenderness. Lungs are clear without rales, wheezes, or rhonchi. Chest is nontender. Heart has regular rate and rhythm without murmur. Abdomen is soft, flat, nontender without masses or hepatosplenomegaly and peristalsis is normoactive. Extremities have no cyanosis, full range of motion is present. Left foot has 1+ edema as well as mild erythema and warmth. The left third, fourth, fifth toes are swollen. The left fourth and fifth toes are tender to palpation. The a split in the skin is seen in the intertriginous area between the fourth and fifth toes. No other break in the skin is seen. No lymphangitic streaks are seen. Skin is warm and dry without rash. Neurologic: Mental status is normal, cranial nerves are intact, there are no motor or sensory deficits.   ED Course  Procedures (including critical care time)  Results for orders placed during the hospital encounter of 03/03/12  CBC WITH DIFFERENTIAL      Component Value Range   WBC 8.0  4.0 - 10.5 K/uL   RBC 4.53  3.87 - 5.11 MIL/uL   Hemoglobin 12.4  12.0 - 15.0 g/dL   HCT 16.1 (*) 09.6 - 04.5 %   MCV 76.8 (*) 78.0 - 100.0 fL   MCH 27.4  26.0 - 34.0 pg   MCHC 35.6  30.0 - 36.0 g/dL   RDW 40.9 (*) 81.1 - 91.4 %   Platelets 220  150 - 400 K/uL   Neutrophils Relative 70  43 - 77 %   Neutro Abs 5.6  1.7 - 7.7 K/uL   Lymphocytes Relative 20  12 - 46 %   Lymphs Abs 1.6  0.7 - 4.0 K/uL   Monocytes Relative 7  3 - 12 %   Monocytes Absolute 0.6  0.1 - 1.0 K/uL   Eosinophils Relative 2  0 - 5 %   Eosinophils Absolute 0.2  0.0 - 0.7 K/uL   Basophils Relative 0  0 - 1 %   Basophils Absolute 0.0  0.0 - 0.1 K/uL  BASIC METABOLIC PANEL      Component Value Range   Sodium 138  135 - 145 mEq/L   Potassium  4.8  3.5 - 5.1 mEq/L   Chloride 97  96 - 112 mEq/L    CO2 30  19 - 32 mEq/L   Glucose, Bld 208 (*) 70 - 99 mg/dL   BUN 20  6 - 23 mg/dL   Creatinine, Ser 1.61 (*) 0.50 - 1.10 mg/dL   Calcium 09.6  8.4 - 04.5 mg/dL   GFR calc non Af Amer 36 (*) >90 mL/min   GFR calc Af Amer 42 (*) >90 mL/min  SEDIMENTATION RATE      Component Value Range   Sed Rate 21  0 - 22 mm/hr  PROTIME-INR      Component Value Range   Prothrombin Time 29.0 (*) 11.6 - 15.2 seconds   INR 2.69 (*) 0.00 - 1.49   Dg Foot Complete Left  03/03/2012  *RADIOLOGY REPORT*  Clinical Data: Foot injury and pain.  Erythema and swelling. Diabetes.  LEFT FOOT - COMPLETE 3+ VIEW  Comparison: None.  Findings:  Soft tissue swelling seen involving the base of the fourth toe.  No acute fracture or dislocation identified.  Old fracture deformity of the proximal phalanx of the fifth toe noted. No evidence of osteolysis or periostitis.  IMPRESSION: Fourth toe soft tissue swelling.  No evidence of acute fracture or dislocation.  Original Report Authenticated By: Danae Orleans, M.D.      1. Cellulitis of left foot       MDM  Cellulitis of the left foot. Margin seems to be a split in the skin between the fourth and fifth toes. X-ray will be obtained to make sure there is no evidence of osteomyelitis, and laboratory workup will be done to help assess whether she will need hospitalization or can be managed with antibiotics at home.   Workup is unremarkable. WBC is normal and sedimentation rate is normal. She has been given an initial dose of cefazolin in the emergency department and will be sent home with prescriptions for cephalexin and Percocet. She is to have her foot reevaluated in 3 days to assess response to treatment.     Dione Booze, MD 03/03/12 [phone]

## 2012-03-03 NOTE — ED Notes (Signed)
Mild, raised reddened area at iv site; doesn't feel hard; good blood return from the IV; warm compress applied.

## 2012-03-03 NOTE — ED Notes (Signed)
Pt. Denies pain

## 2012-03-03 NOTE — ED Notes (Signed)
Pt. Is unsure of how she hurt her left foot but noticed that it was red and swollen on Wednesday.  Pt reports that it "did not hurt bad enough to come in."  Now pt. Is unable to walk on it at this time.  Pt.'s left foot is extremely painful to touch and has Noted  swelling to the 4th and 5th toes.

## 2012-03-05 ENCOUNTER — Telehealth: Payer: Self-pay | Admitting: Internal Medicine

## 2012-03-05 NOTE — Assessment & Plan Note (Signed)
#  COPD  -continue oxygen all the time - conitnue spiriva; take 2 samples

## 2012-03-05 NOTE — Assessment & Plan Note (Signed)
.   She has completed 2 years of coumadin for idiopathic PE with good compliance and at therapeutic INR. However, D-dimer continues to be persistently high. Difficult decision to stop coumadin at this point (risk  V benefit). Given her rising age though handling coumadin well, will continue coumadin but cut down target INR to 1.5 - 2 and continue for 2 more years atleast

## 2012-03-05 NOTE — Telephone Encounter (Signed)
Alejandra  [name]) She was supposed to have CT chest for lung nodule. Ensure this happens. I beliebe I placed order at last ov  B) d-dimer still high. So cannot stop coumadin. Instead reduce coumadin to target INR 1.5-2. Please inform coumadin clinic  V her PMD. She should contnue coumadin at this reduced dose for another 1-2 years atleast  Thanks  MR

## 2012-03-05 NOTE — Assessment & Plan Note (Signed)
#  Lung nodule  - ordered CT chest without contrast   - wil call you with results

## 2012-03-06 NOTE — Telephone Encounter (Signed)
CT set for 03/24/12.Carron Curie, CMA

## 2012-03-07 NOTE — Telephone Encounter (Signed)
Pt aware and states she has already seen coumadin clinic and they have reduced her coumadin. Pt aware of CT appt. Carron Curie, CMA

## 2012-03-09 ENCOUNTER — Ambulatory Visit (INDEPENDENT_AMBULATORY_CARE_PROVIDER_SITE_OTHER): Payer: Medicare Other

## 2012-03-09 DIAGNOSIS — I2699 Other pulmonary embolism without acute cor pulmonale: Secondary | ICD-10-CM

## 2012-03-24 ENCOUNTER — Ambulatory Visit (INDEPENDENT_AMBULATORY_CARE_PROVIDER_SITE_OTHER)
Admission: RE | Admit: 2012-03-24 | Discharge: 2012-03-24 | Disposition: A | Discharge status: Home / Self Care | Payer: Medicare Other | Source: Ambulatory Visit | Attending: Internal Medicine | Admitting: Internal Medicine

## 2012-03-24 DIAGNOSIS — R911 Solitary pulmonary nodule: Secondary | ICD-10-CM

## 2012-03-24 DIAGNOSIS — I2699 Other pulmonary embolism without acute cor pulmonale: Secondary | ICD-10-CM

## 2012-03-30 ENCOUNTER — Ambulatory Visit (INDEPENDENT_AMBULATORY_CARE_PROVIDER_SITE_OTHER): Payer: Medicare Other | Admitting: Pharmacist

## 2012-03-30 DIAGNOSIS — I2699 Other pulmonary embolism without acute cor pulmonale: Secondary | ICD-10-CM

## 2012-04-24 ENCOUNTER — Ambulatory Visit (INDEPENDENT_AMBULATORY_CARE_PROVIDER_SITE_OTHER): Payer: Medicare Other | Admitting: General Practice

## 2012-04-24 DIAGNOSIS — I2699 Other pulmonary embolism without acute cor pulmonale: Secondary | ICD-10-CM

## 2012-04-24 LAB — POCT INR: INR: 2.3

## 2012-05-19 DIAGNOSIS — Z9981 Dependence on supplemental oxygen: Secondary | ICD-10-CM

## 2012-05-19 HISTORY — DX: Dependence on supplemental oxygen: Z99.81

## 2012-05-22 ENCOUNTER — Ambulatory Visit (INDEPENDENT_AMBULATORY_CARE_PROVIDER_SITE_OTHER): Payer: Medicare Other | Admitting: General Practice

## 2012-05-22 DIAGNOSIS — I2699 Other pulmonary embolism without acute cor pulmonale: Secondary | ICD-10-CM

## 2012-05-26 ENCOUNTER — Other Ambulatory Visit: Payer: Self-pay | Admitting: *Deleted

## 2012-05-26 DIAGNOSIS — Z48812 Encounter for surgical aftercare following surgery on the circulatory system: Secondary | ICD-10-CM

## 2012-05-26 DIAGNOSIS — I714 Abdominal aortic aneurysm, without rupture: Secondary | ICD-10-CM

## 2012-05-30 ENCOUNTER — Encounter: Payer: Self-pay | Admitting: Neurosurgery

## 2012-05-31 ENCOUNTER — Encounter: Payer: Self-pay | Admitting: Neurosurgery

## 2012-05-31 ENCOUNTER — Ambulatory Visit (INDEPENDENT_AMBULATORY_CARE_PROVIDER_SITE_OTHER): Payer: Medicare Other | Admitting: Neurosurgery

## 2012-05-31 ENCOUNTER — Ambulatory Visit (INDEPENDENT_AMBULATORY_CARE_PROVIDER_SITE_OTHER): Payer: Medicare Other | Admitting: Vascular Surgery

## 2012-05-31 VITALS — BP 162/97 | HR 80 | Resp 18 | Ht 65.0 in | Wt 180.0 lb

## 2012-05-31 DIAGNOSIS — Z48812 Encounter for surgical aftercare following surgery on the circulatory system: Secondary | ICD-10-CM

## 2012-05-31 DIAGNOSIS — I714 Abdominal aortic aneurysm, without rupture: Secondary | ICD-10-CM

## 2012-05-31 NOTE — Progress Notes (Signed)
[facility] [place] AAA/PAD/PVD Office Note  CC: AAA status post endograft repair surveillance Referring Physician: Brabham  History of Present Illness: 78 year old female patient of Dr. Myra Gianotti status post endovascular aortic repair April 2013. The patient denies any unusual abdominal or back pain. The patient denies any new medical diagnoses or recent surgery.  Past Medical History  Diagnosis Date  . Pulmonary embolism   . COPD (chronic obstructive pulmonary disease)   . DM (diabetes mellitus)   . Dyslipidemia   . Hypertension   . Hyperlipidemia     ROS: [x]  Positive   [ ]  Denies    General: [ ]  Weight loss, [ ]  Fever, [ ]  chills Neurologic: [ ]  Dizziness, [ ]  Blackouts, [ ]  Seizure [ ]  Stroke, [ ]  "Mini stroke", [ ]  Slurred speech, [ ]  Temporary blindness; [ ]  weakness in arms or legs, [ ]  Hoarseness Cardiac: [ ]  Chest pain/pressure, [ ]  Shortness of breath at rest [ ]  Shortness of breath with exertion, [ ]  Atrial fibrillation or irregular heartbeat Vascular: [ ]  Pain in legs with walking, [ ]  Pain in legs at rest, [ ]  Pain in legs at night,  [ ]  Non-healing ulcer, [ ]  Blood clot in vein/DVT,   Pulmonary: [ ]  Home oxygen, [ ]  Productive cough, [ ]  Coughing up blood, [ ]  Asthma,  [ ]  Wheezing Musculoskeletal:  [ ]  Arthritis, [ ]  Low back pain, [ ]  Joint pain Hematologic: [ ]  Easy Bruising, [ ]  Anemia; [ ]  Hepatitis Gastrointestinal: [ ]  Blood in stool, [ ]  Gastroesophageal Reflux/heartburn, [ ]  Trouble swallowing Urinary: [ ]  chronic Kidney disease, [ ]  on HD - [ ]  MWF or [ ]  TTHS, [ ]  Burning with urination, [ ]  Difficulty urinating Skin: [ ]  Rashes, [ ]  Wounds Psychological: [ ]  Anxiety, [ ]  Depression   Social History History  Substance Use Topics  . Smoking status: Former Smoker -- 0.5 packs/day for 54 years    Quit date: 01/23/2010  . Smokeless tobacco: Not on file  . Alcohol Use: No    Family History Family History  Problem Relation Age of  Onset  . Heart disease Mother   . Hypertension Mother   . Heart disease Sister     x 3  . Hypertension Sister   . Clotting disorder Daughter   . Clotting disorder Son     multiple sons  . Hypertension Father   . Hypertension Brother     Allergies  Allergen Reactions  . Penicillins Other (See Comments)    Childhood reaction    Current Outpatient Prescriptions  Medication Sig Dispense Refill  . albuterol (PROVENTIL) (2.5 MG/3ML) 0.083% nebulizer solution Take 2.5 mg by nebulization every 6 (six) hours as needed. For shortness of breath      . Alcohol Swabs (ALCOHOL PREP) 70 % PADS       . aspirin 81 MG tablet Take 81 mg by mouth daily.        . Cholecalciferol (VITAMIN D3) 1000 UNITS CAPS Take 1 capsule by mouth daily.        Marland Kitchen EASY COMFORT INSULIN SYRINGE 30G X 1/2" 0.5 ML MISC       . glipiZIDE (GLUCOTROL) 5 MG tablet Take 5 mg by mouth daily.       Marland Kitchen glucose blood test strip       . HYDROcodone-acetaminophen (NORCO) 5-325 MG per tablet Take 1 tablet by mouth 4 (four) times daily as needed. For pain       .  Lancet Devices (LANCING DEVICE) MISC       . Lancets (ONETOUCH ULTRASOFT) lancets Use as directed      . lovastatin (MEVACOR) 20 MG tablet Take 20 mg by mouth at bedtime.        . metFORMIN (GLUCOPHAGE) 500 MG tablet Take 500 mg by mouth 2 (two) times daily with a meal.       . metoprolol (LOPRESSOR) 50 MG tablet Take 1 tablet by mouth 2 (two) times daily.       . Multiple Vitamin (MULTIVITAMIN) tablet Take 1 tablet by mouth daily.        Marland Kitchen omega-3 acid ethyl esters (LOVAZA) 1 G capsule Take 2 g by mouth 2 (two) times daily.      . ONE TOUCH ULTRA TEST test strip Use as directed      . pantoprazole (PROTONIX) 40 MG tablet       . tiotropium (SPIRIVA) 18 MCG inhalation capsule Place 18 mcg into inhaler and inhale daily as needed.       . triamterene-hydrochlorothiazide (MAXZIDE-25) 37.5-25 MG per tablet Take 1 tablet by mouth daily.        Marland Kitchen warfarin (COUMADIN) 5 MG tablet  Take 5 mg by mouth as directed. Take at 6pm on an empty stomach        Physical Examination  Filed Vitals:   05/31/12 0957  BP: 162/97  Pulse: 80  Resp: 18    Body mass index is 29.95 kg/(m^2).  General:  WDWN in NAD Gait: Normal HEENT: WNL Eyes: Pupils equal Pulmonary: normal non-labored breathing , without Rales, rhonchi,  wheezing Cardiac: RRR, without  Murmurs, rubs or gallops; No carotid bruits Abdomen: soft, NT, no masses Skin: no rashes, ulcers noted Vascular Exam/Pulses: Palpable femoral pulses bilaterally, there is no abdominal mass palpated  Extremities without ischemic changes, no Gangrene , no cellulitis; no open wounds;  Musculoskeletal: no muscle wasting or atrophy  Neurologic: A&O X 3; Appropriate Affect ; SENSATION: normal; MOTOR FUNCTION:  moving all extremities equally. Speech is fluent/normal  Non-Invasive Vascular Imaging: AAA duplex today shows a maximum diameter of 5.1 x 5.0 which is down from previous exam in May of 2013 she was 5.8 x 5.6  ASSESSMENT/PLAN: Asymptomatic patient status post AAA endovascular repair. The patient will followup in 6 months with repeat AAA duplex, her questions were encouraged and answered, she is in agreement with this plan.  Lauree Chandler ANP  , M.D.: Edilia Bo

## 2012-05-31 NOTE — Progress Notes (Signed)
Abdominal aorta duplex performed post EVAR @ [facility] 05/31/2012

## 2012-06-19 ENCOUNTER — Ambulatory Visit (INDEPENDENT_AMBULATORY_CARE_PROVIDER_SITE_OTHER): Payer: Medicare Other | Admitting: General Practice

## 2012-06-19 DIAGNOSIS — I2699 Other pulmonary embolism without acute cor pulmonale: Secondary | ICD-10-CM

## 2012-07-10 ENCOUNTER — Ambulatory Visit (INDEPENDENT_AMBULATORY_CARE_PROVIDER_SITE_OTHER): Payer: Medicare Other | Admitting: General Practice

## 2012-07-10 DIAGNOSIS — I2699 Other pulmonary embolism without acute cor pulmonale: Secondary | ICD-10-CM

## 2012-07-10 LAB — POCT INR: INR: 1.9

## 2012-07-28 ENCOUNTER — Other Ambulatory Visit: Payer: Self-pay | Admitting: *Deleted

## 2012-07-28 DIAGNOSIS — I714 Abdominal aortic aneurysm, without rupture: Secondary | ICD-10-CM

## 2012-08-11 ENCOUNTER — Ambulatory Visit (INDEPENDENT_AMBULATORY_CARE_PROVIDER_SITE_OTHER): Payer: Medicare Other | Admitting: General Practice

## 2012-08-11 DIAGNOSIS — I2699 Other pulmonary embolism without acute cor pulmonale: Secondary | ICD-10-CM

## 2012-08-11 LAB — POCT INR: INR: 2.3

## 2012-09-29 ENCOUNTER — Ambulatory Visit (INDEPENDENT_AMBULATORY_CARE_PROVIDER_SITE_OTHER): Payer: Medicare Other | Admitting: General Practice

## 2012-09-29 DIAGNOSIS — I2699 Other pulmonary embolism without acute cor pulmonale: Secondary | ICD-10-CM

## 2012-09-29 LAB — POCT INR: INR: 4.1

## 2012-10-20 ENCOUNTER — Ambulatory Visit (INDEPENDENT_AMBULATORY_CARE_PROVIDER_SITE_OTHER): Payer: Medicare Other | Admitting: General Practice

## 2012-10-20 DIAGNOSIS — I2699 Other pulmonary embolism without acute cor pulmonale: Secondary | ICD-10-CM

## 2012-11-17 ENCOUNTER — Ambulatory Visit (INDEPENDENT_AMBULATORY_CARE_PROVIDER_SITE_OTHER): Payer: Medicare Other | Admitting: General Practice

## 2012-11-17 DIAGNOSIS — I2699 Other pulmonary embolism without acute cor pulmonale: Secondary | ICD-10-CM

## 2012-11-17 LAB — POCT INR: INR: 2.4

## 2012-11-27 ENCOUNTER — Ambulatory Visit: Payer: Medicare Other | Admitting: Neurosurgery

## 2012-11-27 ENCOUNTER — Encounter (INDEPENDENT_AMBULATORY_CARE_PROVIDER_SITE_OTHER): Payer: Medicare Other | Admitting: *Deleted

## 2012-11-27 DIAGNOSIS — I714 Abdominal aortic aneurysm, without rupture: Secondary | ICD-10-CM

## 2012-11-27 DIAGNOSIS — Z48812 Encounter for surgical aftercare following surgery on the circulatory system: Secondary | ICD-10-CM

## 2012-11-28 ENCOUNTER — Other Ambulatory Visit: Payer: Self-pay

## 2012-11-28 DIAGNOSIS — I714 Abdominal aortic aneurysm, without rupture: Secondary | ICD-10-CM

## 2012-11-28 DIAGNOSIS — Z48812 Encounter for surgical aftercare following surgery on the circulatory system: Secondary | ICD-10-CM

## 2012-12-01 ENCOUNTER — Encounter: Payer: Self-pay | Admitting: Surgery

## 2012-12-15 ENCOUNTER — Ambulatory Visit (INDEPENDENT_AMBULATORY_CARE_PROVIDER_SITE_OTHER): Payer: Medicare Other | Admitting: Family Medicine

## 2012-12-15 DIAGNOSIS — I2699 Other pulmonary embolism without acute cor pulmonale: Secondary | ICD-10-CM

## 2013-01-26 ENCOUNTER — Ambulatory Visit (INDEPENDENT_AMBULATORY_CARE_PROVIDER_SITE_OTHER): Payer: Medicare Other | Admitting: General Practice

## 2013-01-26 DIAGNOSIS — I2699 Other pulmonary embolism without acute cor pulmonale: Secondary | ICD-10-CM

## 2013-03-09 ENCOUNTER — Ambulatory Visit (INDEPENDENT_AMBULATORY_CARE_PROVIDER_SITE_OTHER): Payer: Medicare Other | Admitting: General Practice

## 2013-03-09 DIAGNOSIS — I2699 Other pulmonary embolism without acute cor pulmonale: Secondary | ICD-10-CM

## 2013-04-20 ENCOUNTER — Ambulatory Visit (INDEPENDENT_AMBULATORY_CARE_PROVIDER_SITE_OTHER): Payer: Medicare Other | Admitting: General Practice

## 2013-04-20 DIAGNOSIS — I2699 Other pulmonary embolism without acute cor pulmonale: Secondary | ICD-10-CM

## 2013-04-20 LAB — POCT INR: INR: 2.1

## 2013-06-01 ENCOUNTER — Ambulatory Visit (INDEPENDENT_AMBULATORY_CARE_PROVIDER_SITE_OTHER): Payer: Medicare Other | Admitting: General Practice

## 2013-06-01 DIAGNOSIS — I2699 Other pulmonary embolism without acute cor pulmonale: Secondary | ICD-10-CM

## 2013-06-01 NOTE — Progress Notes (Signed)
Pre-visit discussion using our clinic review tool. No additional management support is needed unless otherwise documented below in the visit note.  

## 2013-06-29 ENCOUNTER — Ambulatory Visit (INDEPENDENT_AMBULATORY_CARE_PROVIDER_SITE_OTHER): Payer: Medicare Other | Admitting: General Practice

## 2013-06-29 DIAGNOSIS — I2699 Other pulmonary embolism without acute cor pulmonale: Secondary | ICD-10-CM

## 2013-06-29 LAB — POCT INR: INR: 3

## 2013-06-29 NOTE — Progress Notes (Signed)
Pre-visit discussion using our clinic review tool. No additional management support is needed unless otherwise documented below in the visit note.  

## 2013-07-27 ENCOUNTER — Ambulatory Visit (INDEPENDENT_AMBULATORY_CARE_PROVIDER_SITE_OTHER): Payer: Managed Care, Other (non HMO) | Admitting: General Practice

## 2013-07-27 DIAGNOSIS — I2699 Other pulmonary embolism without acute cor pulmonale: Secondary | ICD-10-CM

## 2013-07-27 LAB — POCT INR: INR: 2.9

## 2013-07-27 NOTE — Progress Notes (Signed)
Pre-visit discussion using our clinic review tool. No additional management support is needed unless otherwise documented below in the visit note.  

## 2013-08-24 ENCOUNTER — Ambulatory Visit (INDEPENDENT_AMBULATORY_CARE_PROVIDER_SITE_OTHER): Payer: Managed Care, Other (non HMO) | Admitting: General Practice

## 2013-08-24 DIAGNOSIS — I2699 Other pulmonary embolism without acute cor pulmonale: Secondary | ICD-10-CM

## 2013-08-24 DIAGNOSIS — Z5181 Encounter for therapeutic drug level monitoring: Secondary | ICD-10-CM | POA: Insufficient documentation

## 2013-08-24 LAB — POCT INR: INR: 3.2

## 2013-08-24 NOTE — Progress Notes (Signed)
Pre-visit discussion using our clinic review tool. No additional management support is needed unless otherwise documented below in the visit note.  

## 2013-09-28 ENCOUNTER — Ambulatory Visit (INDEPENDENT_AMBULATORY_CARE_PROVIDER_SITE_OTHER): Payer: Managed Care, Other (non HMO) | Admitting: General Practice

## 2013-09-28 DIAGNOSIS — I2699 Other pulmonary embolism without acute cor pulmonale: Secondary | ICD-10-CM

## 2013-09-28 DIAGNOSIS — Z5181 Encounter for therapeutic drug level monitoring: Secondary | ICD-10-CM

## 2013-09-28 LAB — POCT INR: INR: 3.4

## 2013-09-28 NOTE — Progress Notes (Signed)
Pre visit review using our clinic review tool, if applicable. No additional management support is needed unless otherwise documented below in the visit note. 

## 2013-10-26 ENCOUNTER — Ambulatory Visit (INDEPENDENT_AMBULATORY_CARE_PROVIDER_SITE_OTHER): Payer: Managed Care, Other (non HMO) | Admitting: General Practice

## 2013-10-26 DIAGNOSIS — I2699 Other pulmonary embolism without acute cor pulmonale: Secondary | ICD-10-CM

## 2013-10-26 DIAGNOSIS — Z5181 Encounter for therapeutic drug level monitoring: Secondary | ICD-10-CM

## 2013-10-26 LAB — POCT INR: INR: 2.4

## 2013-10-26 NOTE — Progress Notes (Signed)
Pre visit review using our clinic review tool, if applicable. No additional management support is needed unless otherwise documented below in the visit note. 

## 2013-11-23 ENCOUNTER — Ambulatory Visit (INDEPENDENT_AMBULATORY_CARE_PROVIDER_SITE_OTHER): Payer: Managed Care, Other (non HMO) | Admitting: Family Medicine

## 2013-11-23 DIAGNOSIS — I2699 Other pulmonary embolism without acute cor pulmonale: Secondary | ICD-10-CM

## 2013-11-23 DIAGNOSIS — Z5181 Encounter for therapeutic drug level monitoring: Secondary | ICD-10-CM

## 2013-11-23 LAB — POCT INR: INR: 1.6

## 2013-11-29 ENCOUNTER — Encounter: Payer: Self-pay | Admitting: Family

## 2013-11-30 ENCOUNTER — Ambulatory Visit (INDEPENDENT_AMBULATORY_CARE_PROVIDER_SITE_OTHER): Payer: Managed Care, Other (non HMO) | Admitting: Family

## 2013-11-30 ENCOUNTER — Encounter: Payer: Self-pay | Admitting: Family

## 2013-11-30 ENCOUNTER — Ambulatory Visit (HOSPITAL_COMMUNITY)
Admission: RE | Admit: 2013-11-30 | Discharge: 2013-11-30 | Disposition: A | Discharge status: Home / Self Care | Payer: Medicare HMO | Source: Ambulatory Visit | Attending: Family | Admitting: Family

## 2013-11-30 VITALS — BP 158/96 | HR 74 | Resp 16 | Ht 65.5 in | Wt 179.0 lb

## 2013-11-30 DIAGNOSIS — I714 Abdominal aortic aneurysm, without rupture, unspecified: Secondary | ICD-10-CM | POA: Insufficient documentation

## 2013-11-30 DIAGNOSIS — Z48812 Encounter for surgical aftercare following surgery on the circulatory system: Secondary | ICD-10-CM

## 2013-11-30 DIAGNOSIS — L989 Disorder of the skin and subcutaneous tissue, unspecified: Secondary | ICD-10-CM

## 2013-11-30 DIAGNOSIS — M7989 Other specified soft tissue disorders: Secondary | ICD-10-CM

## 2013-11-30 NOTE — Progress Notes (Signed)
[facility] [place]  Established Abdominal Aortic Aneurysm  History of Present Illness  Alejandra [name] is a 80 y.o. (04/23/1932) female patient of Dr. Trula Slade status post endovascular aortic repair April 2013. She returns today for follow up surveillance of the EVAR.  She has intermittent chronic back pain for years since a mild back injury, no new back pain, denies any abdominal pain. The patient denies claudication in legs with walking. The patient denies history of stroke or TIA symptoms. She complains of intermittent mild right ankle swelling. She takes coumadin for history of PE. She also takes a daily statin. She reports COPD, mild dyspnea at times, uses home O2, 2L, prn and at night. She denies having an MI.  Pt Diabetic: Yes, pt reports her fasting glucose range is 90's to 150's Pt smoker: former smoker, quit in 2011  Past Medical History  Diagnosis Date  . Pulmonary embolism   . COPD (chronic obstructive pulmonary disease)   . DM (diabetes mellitus)   . Dyslipidemia   . Hypertension   . Hyperlipidemia   . AAA (abdominal aortic aneurysm)   . Left foot infection 2013  . 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 Vesicovaginal fistula closure w/ tah    . Endovascular stent insertion  10/30/2011    Procedure: ENDOVASCULAR STENT GRAFT INSERTION;  Surgeon: Serafina Mitchell, MD;  Location: [facility];  Service: Vascular;  Laterality: N/A;  . Abdominal aortic aneurysm repair  10-30-2011   Social History History   Social History  . Marital Status: Widowed    Spouse Name: N/A    Number of Children: N/A  . Years of Education: N/A   Occupational History  . retired     Building surveyor   Social History Main Topics  . Smoking status: Former Smoker -- 0.50 packs/day for 68 years    Quit date: 01/23/2010  . Smokeless tobacco: Not on file  . Alcohol Use: No  . Drug Use: No  . Sexual Activity: Not on file   Other  Topics Concern  . Not on file   Social History Narrative  . No narrative on file   Family History Family History  Problem Relation Age of Onset  . Heart disease Mother   . Hypertension Mother   . Heart disease Sister     x 3  . Hypertension Sister   . Clotting disorder Daughter   . Clotting disorder Son     multiple sons  . Hypertension Father   . Hypertension Brother     Current Outpatient Prescriptions on File Prior to Visit  Medication Sig Dispense Refill  . albuterol (PROVENTIL) (2.5 MG/3ML) 0.083% nebulizer solution Take 2.5 mg by nebulization every 6 (six) hours as needed. For shortness of breath      . Alcohol Swabs (ALCOHOL PREP) 70 % PADS       . aspirin 81 MG tablet Take 81 mg by mouth daily.        . Cholecalciferol (VITAMIN D3) 1000 UNITS CAPS Take 1 capsule by mouth daily.        Marland Kitchen EASY COMFORT INSULIN SYRINGE 30G X 1/2" 0.5 ML MISC       . glipiZIDE (GLUCOTROL) 5 MG tablet Take 5 mg by mouth daily.       Marland Kitchen glucose blood test strip       . HYDROcodone-acetaminophen (NORCO) 5-325 MG per tablet Take 1 tablet by mouth 4 (  four) times daily as needed. For pain       . Lancet Devices (LANCING DEVICE) MISC       . Lancets (ONETOUCH ULTRASOFT) lancets Use as directed      . lovastatin (MEVACOR) 20 MG tablet Take 20 mg by mouth at bedtime.        . metFORMIN (GLUCOPHAGE) 500 MG tablet Take 500 mg by mouth 2 (two) times daily with a meal.       . metoprolol (LOPRESSOR) 50 MG tablet Take 1 tablet by mouth 2 (two) times daily.       . Multiple Vitamin (MULTIVITAMIN) tablet Take 1 tablet by mouth daily.        Marland Kitchen omega-3 acid ethyl esters (LOVAZA) 1 G capsule Take 2 g by mouth 2 (two) times daily.      . ONE TOUCH ULTRA TEST test strip Use as directed      . pantoprazole (PROTONIX) 40 MG tablet       . tiotropium (SPIRIVA) 18 MCG inhalation capsule Place 18 mcg into inhaler and inhale daily as needed.       . triamterene-hydrochlorothiazide (MAXZIDE-25) 37.5-25 MG per tablet  Take 1 tablet by mouth daily.        Marland Kitchen warfarin (COUMADIN) 5 MG tablet Take 5 mg by mouth as directed. Take at 6pm on an empty stomach       No current facility-administered medications on file prior to visit.   Allergies  Allergen Reactions  . Penicillins Other (See Comments)    Childhood reaction    ROS: See HPI for pertinent positives and negatives.  Physical Examination  Filed Vitals:   11/30/13 0920  BP: 158/96  Pulse: 74  Resp: 16  Height: 5' 5.5" (1.664 m)  Weight: 179 lb (81.194 kg)  SpO2: 93%   Body mass index is 29.32 kg/(m^2).  General: A&O x 3, WD.  Pulmonary: Sym exp, fair air movt, CTAB, no rales, rhonchi, or wheezing.  Cardiac: RRR, Nl S1, S2, no detected murmur.   Carotid Bruits Left Right   Negative Negative   Aorta is not palpable Radial pulses are 1+ palpable and =                          VASCULAR EXAM:                                                                                                         LE Pulses LEFT RIGHT       FEMORAL  3+ palpable  3+ palpable        POPLITEAL  not palpable   not palpable       POSTERIOR TIBIAL  not palpable   not palpable        DORSALIS PEDIS      ANTERIOR TIBIAL 2+ palpable  not palpable     Skin: Multi hued dark macular lesion lateral aspect left lower leg with irregular borders.  Gastrointestinal: soft, NTND, -G/R, - HSM, - masses, - CVAT B.  Musculoskeletal: M/S 5/5 throughout, Extremities without ischemic changes.  Neurologic: CN 2-12 intact, Pain and light touch intact in extremities are intact except, Motor exam as listed above.  Non-Invasive Vascular Imaging  AAA Duplex (11/30/2013) ABDOMINAL AORTA DUPLEX EVALUATION - POST ENDOVASCULAR REPAIR    INDICATION: Evaluation of endovascular abdominal repair of aortic aneurysm.    PREVIOUS INTERVENTION(S): Endovascular aortic repair 10/30/2011.    DUPLEX EXAM:      DIAMETER AP (cm) DIAMETER TRANSVERSE (cm) VELOCITIES (cm/sec)  Aorta  5.63 5.33 31  Right Common Iliac Not Visualized  Not Visualized    Left Common Iliac Not visualized  Not visualized     Comparison Study       Date DIAMETER AP (cm) DIAMETER TRANSVERSE (cm)  11/27/2012 5.37 5.37     ADDITIONAL FINDINGS:     IMPRESSION: Patent endovascular aortic repair with a continued increase of the residual sac with a maximum diameter of 5.63 x 5.33cm. Possible lumbar branch noted. Limited visualization of the common iliac arteries.    Compared to the previous exam:  Slight increase of the maximum diameter when compared to the last exam on 11/27/2012.    Medical Decision Making  The patient is a 80 y.o. female who presents with asymptomatic AAA with increasing sac size.   Based on this patient's exam and diagnostic studies, and after discussing with Dr. Bridgett Larsson, the patient will be scheduled for a CTA of abdomen/pelvis and follow up with Dr. Trula Slade. Referral to dermatologist for evaluation and management of multi hued dark macular lesion lateral aspect left lower leg with irregular borders.  I emphasized the importance of maximal medical management including strict control of blood pressure, blood glucose, and lipid levels, antiplatelet agents, obtaining regular exercise, and continued cessation of smoking.   The patient was given information about AAA including signs, symptoms, treatment, and how to minimize the risk of enlargement and rupture of aneurysms.    The patient was advised to call [phone] should the patient experience sudden onset abdominal or back pain.   Thank you for allowing [place] to participate in this patient's care.  Clemon Chambers, RN, MSN, FNP-C [facility] [place] Office: [phone]  Clinic Physician: Bridgett Larsson  11/30/2013, 9:15 AM

## 2013-11-30 NOTE — Patient Instructions (Signed)

## 2013-11-30 NOTE — Addendum Note (Signed)
Addended by: Mena Goes on: 11/30/2013 01:41 PM   Modules accepted: Orders

## 2013-11-30 NOTE — Addendum Note (Signed)
Addended by: Mena Goes on: 11/30/2013 01:43 PM   Modules accepted: Orders

## 2013-12-03 ENCOUNTER — Ambulatory Visit: Payer: Medicare Other | Admitting: Family

## 2013-12-03 ENCOUNTER — Other Ambulatory Visit (HOSPITAL_COMMUNITY): Payer: Medicare Other

## 2013-12-03 ENCOUNTER — Telehealth: Payer: Self-pay | Admitting: Surgery

## 2013-12-14 ENCOUNTER — Encounter: Payer: Self-pay | Admitting: Surgery

## 2013-12-17 ENCOUNTER — Ambulatory Visit
Admission: RE | Admit: 2013-12-17 | Discharge: 2013-12-17 | Disposition: A | Discharge status: Home / Self Care | Payer: Medicare HMO | Source: Ambulatory Visit | Attending: Family | Admitting: Family

## 2013-12-17 ENCOUNTER — Encounter: Payer: Self-pay | Admitting: Surgery

## 2013-12-17 ENCOUNTER — Ambulatory Visit (INDEPENDENT_AMBULATORY_CARE_PROVIDER_SITE_OTHER): Payer: Medicare HMO | Admitting: Surgery

## 2013-12-17 VITALS — BP 181/97 | HR 80 | Resp 16 | Ht 65.5 in | Wt 179.0 lb

## 2013-12-17 DIAGNOSIS — M7989 Other specified soft tissue disorders: Secondary | ICD-10-CM

## 2013-12-17 DIAGNOSIS — Z48812 Encounter for surgical aftercare following surgery on the circulatory system: Secondary | ICD-10-CM

## 2013-12-17 DIAGNOSIS — I714 Abdominal aortic aneurysm, without rupture, unspecified: Secondary | ICD-10-CM

## 2013-12-17 MED ORDER — IOHEXOL 350 MG/ML SOLN
80.0000 mL | Freq: Once | INTRAVENOUS | Status: AC | PRN
Start: 2013-12-17 — End: 2013-12-17
  Administered 2013-12-17: 80 mL via INTRAVENOUS

## 2013-12-17 NOTE — Addendum Note (Signed)
Addended by: Mena Goes on: 12/17/2013 05:05 PM   Modules accepted: Orders

## 2013-12-17 NOTE — Progress Notes (Addendum)
    Established Abdominal Aortic Aneurysm  History of Present Illness  The patient is a 80 y.o. (March 03, 1932) female who presents with chief complaint: follow up for AAA.  Previous studies demonstrate an AAA, measuring  5.8 x 5.35m.  The patient does not have back or abdominal pain. She was previously seen in the office on 11/30/13. Duplex studies saw an increase in aneurysm sac size and the patient was advised to obtain a CT angiogram. The patient is not a smoker.  The patient's PMH, PSH, SH, FamHx, Med, and Allergies are unchanged from 11/30/13.  On ROS today: denies back and abdominal pain.   Physical Examination  Filed Vitals:   12/17/13 1229  BP: 181/97  Pulse: 80  Resp: 16  Height: 5' 5.5" (1.664 m)  Weight: 179 lb (81.194 kg)  SpO2: 96%   Body mass index is 29.32 kg/(m^2).  General: A&O x 3, WD  Pulmonary: Sym exp, good air movt, CTAB, no rales, rhonchi  Cardiac: RRR, Nl S1, S2, no Murmurs, rubs or gallops, no carotid bruits   Gastrointestinal: soft, NTND, -masses + AAA   Musculoskeletal: M/S 5/5 throughout    Non-Invasive Vascular Imaging  AAA duplex    Previous size: 5.6 x5.3 cm (Date: 11/30/13 ) CT angiogram   Current size:  5.5 x 5.7 cm (Date: 12/17/13)  Medical Decision Making  The patient is a 81 y.o. female who presents with: nonsymptomatic AAA with decreasing size.   Based on this patient's exam and diagnostic studies, she needs to follow up in 6 months for another CT angiogram. There is no current evidence of endoleak.  The threshold for repair is AAA size > 5.5 cm, growth > 1 cm/yr, and symptomatic status.  I emphasized the importance of maximal medical management including strict control of blood pressure, blood glucose, and lipid levels, antiplatelet agents, obtaining regular exercise, and cessation of smoking.    Thank you for allowing [place] to participate in this patient's care.  Alejandra Jock, PA-C [facility]  [place] Office: [phone] Pager: [phone]  12/17/2013, 1:20 PM    I agree with the above.  I have seen and evaluated the patient.  I reviewed her CT scan.  When comparing her CAT scan from one month postop until today, the aneurysm has slightly decreased in size.  This makes me question the accuracy of her postoperative ultrasound, as no obvious evidence of endoleak is identified on CT scan.  I have recommended that we see the patient back in 6 months with a repeat CT angiogram to assess the size of the aneurysm sac.  Alejandra [name]

## 2013-12-19 NOTE — Telephone Encounter (Signed)
xxx

## 2014-01-08 ENCOUNTER — Ambulatory Visit (INDEPENDENT_AMBULATORY_CARE_PROVIDER_SITE_OTHER): Payer: Medicare HMO | Admitting: Family Medicine

## 2014-01-08 DIAGNOSIS — I2699 Other pulmonary embolism without acute cor pulmonale: Secondary | ICD-10-CM

## 2014-01-08 DIAGNOSIS — Z5181 Encounter for therapeutic drug level monitoring: Secondary | ICD-10-CM

## 2014-01-08 LAB — POCT INR: INR: 2.5

## 2014-01-16 ENCOUNTER — Other Ambulatory Visit: Payer: Managed Care, Other (non HMO)

## 2014-02-08 ENCOUNTER — Ambulatory Visit (INDEPENDENT_AMBULATORY_CARE_PROVIDER_SITE_OTHER): Payer: Medicare HMO | Admitting: General Practice

## 2014-02-08 DIAGNOSIS — I2699 Other pulmonary embolism without acute cor pulmonale: Secondary | ICD-10-CM

## 2014-02-08 DIAGNOSIS — Z5181 Encounter for therapeutic drug level monitoring: Secondary | ICD-10-CM

## 2014-02-08 LAB — POCT INR: INR: 3

## 2014-02-08 NOTE — Progress Notes (Signed)
Pre visit review using our clinic review tool, if applicable. No additional management support is needed unless otherwise documented below in the visit note. 

## 2014-03-22 ENCOUNTER — Ambulatory Visit (INDEPENDENT_AMBULATORY_CARE_PROVIDER_SITE_OTHER): Payer: Medicare HMO | Admitting: *Deleted

## 2014-03-22 DIAGNOSIS — Z5181 Encounter for therapeutic drug level monitoring: Secondary | ICD-10-CM

## 2014-03-22 DIAGNOSIS — I2699 Other pulmonary embolism without acute cor pulmonale: Secondary | ICD-10-CM

## 2014-03-22 LAB — POCT INR: INR: 2.3

## 2014-04-02 ENCOUNTER — Ambulatory Visit: Payer: Medicare HMO | Admitting: Vascular Surgery

## 2014-04-30 ENCOUNTER — Ambulatory Visit (INDEPENDENT_AMBULATORY_CARE_PROVIDER_SITE_OTHER): Payer: Medicare HMO | Admitting: *Deleted

## 2014-04-30 DIAGNOSIS — I2699 Other pulmonary embolism without acute cor pulmonale: Secondary | ICD-10-CM

## 2014-04-30 DIAGNOSIS — Z5181 Encounter for therapeutic drug level monitoring: Secondary | ICD-10-CM

## 2014-04-30 LAB — POCT INR: INR: 2.5

## 2014-06-12 ENCOUNTER — Ambulatory Visit (INDEPENDENT_AMBULATORY_CARE_PROVIDER_SITE_OTHER): Payer: Medicare HMO

## 2014-06-12 DIAGNOSIS — I2699 Other pulmonary embolism without acute cor pulmonale: Secondary | ICD-10-CM

## 2014-06-12 DIAGNOSIS — Z5181 Encounter for therapeutic drug level monitoring: Secondary | ICD-10-CM

## 2014-06-12 LAB — POCT INR: INR: 2.4

## 2014-06-21 ENCOUNTER — Other Ambulatory Visit: Payer: Self-pay | Admitting: Surgery

## 2014-06-21 LAB — CREATININE, SERUM: CREATININE: 1.1 mg/dL (ref 0.50–1.10)

## 2014-06-21 LAB — BUN: BUN: 18 mg/dL (ref 6–23)

## 2014-06-24 ENCOUNTER — Ambulatory Visit (INDEPENDENT_AMBULATORY_CARE_PROVIDER_SITE_OTHER): Payer: Medicare HMO | Admitting: Surgery

## 2014-06-24 ENCOUNTER — Ambulatory Visit
Admission: RE | Admit: 2014-06-24 | Discharge: 2014-06-24 | Disposition: A | Discharge status: Home / Self Care | Payer: Medicare HMO | Source: Ambulatory Visit | Attending: Surgery | Admitting: Surgery

## 2014-06-24 ENCOUNTER — Encounter: Payer: Self-pay | Admitting: Surgery

## 2014-06-24 VITALS — BP 153/92 | HR 80 | Ht 65.5 in | Wt 176.0 lb

## 2014-06-24 DIAGNOSIS — I714 Abdominal aortic aneurysm, without rupture, unspecified: Secondary | ICD-10-CM

## 2014-06-24 DIAGNOSIS — Z48812 Encounter for surgical aftercare following surgery on the circulatory system: Secondary | ICD-10-CM

## 2014-06-24 MED ORDER — IOHEXOL 350 MG/ML SOLN
75.0000 mL | Freq: Once | INTRAVENOUS | Status: AC | PRN
  Administered 2014-06-24: 75 mL via INTRAVENOUS

## 2014-06-24 NOTE — Progress Notes (Signed)
Patient name: Alejandra [name] MRN: [id number] DOB: 31-Aug-1931 Sex: female     Chief Complaint  Patient presents with  . Re-evaluation    6 month f/u     HISTORY OF PRESENT ILLNESS: The patient is back today for follow-up.  She is status post endovascular repair of an abdominal aortic aneurysm.  Was performed on 10/30/2011.  This was for a 6.6 cm aneurysm.  Recent ultrasound showed an increase in size her aneurysm and therefore she had a CT scan performed in May 2015.  She is back for CT scan.  There is been no interval change  Past Medical History  Diagnosis Date  . Pulmonary embolism   . COPD (chronic obstructive pulmonary disease)   . DM (diabetes mellitus)   . Dyslipidemia   . Hypertension   . Hyperlipidemia   . AAA (abdominal aortic aneurysm)   . Left foot infection 2013  . 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 Vesicovaginal fistula closure w/ tah    . Endovascular stent insertion  10/30/2011    Procedure: ENDOVASCULAR STENT GRAFT INSERTION;  Surgeon: Serafina Mitchell, MD;  Location: [facility] OR;  Service: Vascular;  Laterality: N/A;  . Abdominal aortic aneurysm repair  10-30-2011    History   Social History  . Marital Status: Widowed    Spouse Name: N/A    Number of Children: N/A  . Years of Education: N/A   Occupational History  . retired     Building surveyor   Social History Main Topics  . Smoking status: Former Smoker -- 0.50 packs/day for 86 years    Quit date: 01/23/2010  . Smokeless tobacco: Never Used  . Alcohol Use: No  . Drug Use: No  . Sexual Activity: Not on file   Other Topics Concern  . Not on file   Social History Narrative    Family History  Problem Relation Age of Onset  . Heart disease Mother   . Hypertension Mother   . Heart attack Mother   . Heart disease Sister     x 3  . Hypertension Sister   . Heart attack Sister   . Clotting disorder Daughter   . Clotting disorder Son     multiple  sons  . Diabetes Son   . Hypertension Father   . Diabetes Father   . Heart attack Father   . Hypertension Brother     Allergies as of 06/24/2014 - Review Complete 06/24/2014  Allergen Reaction Noted  . Penicillins Nausea And Vomiting, Rash, and Other (See Comments)     Current Outpatient Prescriptions on File Prior to Visit  Medication Sig Dispense Refill  . albuterol (PROVENTIL) (2.5 MG/3ML) 0.083% nebulizer solution Take 2.5 mg by nebulization every 6 (six) hours as needed. For shortness of breath    . Alcohol Swabs (ALCOHOL PREP) 70 % PADS     . aspirin 81 MG tablet Take 81 mg by mouth daily.      . Cholecalciferol (VITAMIN D3) 1000 UNITS CAPS Take 1 capsule by mouth daily.      Marland Kitchen EASY COMFORT INSULIN SYRINGE 30G X 1/2" 0.5 ML MISC     . glipiZIDE (GLUCOTROL) 5 MG tablet Take 5 mg by mouth daily.     Marland Kitchen glucose blood test strip     . HYDROcodone-acetaminophen (NORCO) 5-325 MG per tablet Take 1 tablet by mouth 4 (four) times daily as needed. For pain     .  Lancet Devices (LANCING DEVICE) MISC     . Lancets (ONETOUCH ULTRASOFT) lancets Use as directed    . lovastatin (MEVACOR) 20 MG tablet Take 20 mg by mouth at bedtime.      . metFORMIN (GLUCOPHAGE) 500 MG tablet Take 500 mg by mouth 2 (two) times daily with a meal.     . metoprolol (LOPRESSOR) 50 MG tablet Take 1 tablet by mouth 2 (two) times daily.     . Multiple Vitamin (MULTIVITAMIN) tablet Take 1 tablet by mouth daily.      Marland Kitchen omega-3 acid ethyl esters (LOVAZA) 1 G capsule Take 2 g by mouth 2 (two) times daily.    . ONE TOUCH ULTRA TEST test strip Use as directed    . pantoprazole (PROTONIX) 40 MG tablet     . tiotropium (SPIRIVA) 18 MCG inhalation capsule Place 18 mcg into inhaler and inhale daily as needed.     . triamterene-hydrochlorothiazide (MAXZIDE-25) 37.5-25 MG per tablet Take 1 tablet by mouth daily.      Marland Kitchen warfarin (COUMADIN) 5 MG tablet Take 5 mg by mouth as directed. Take at 6pm on an empty stomach     No current  facility-administered medications on file prior to visit.     REVIEW OF SYSTEMS: No change from prior visit  PHYSICAL EXAMINATION:   Vital signs are BP 153/92 mmHg  Pulse 80  Ht 5' 5.5" (1.664 m)  Wt 176 lb (79.833 kg)  BMI 28.83 kg/m2  SpO2 93% General: The patient appears their stated age. HEENT:  No gross abnormalities Pulmonary:  Non labored breathing Abdomen: Soft and non-tender Musculoskeletal: There are no major deformities. Neurologic: No focal weakness or paresthesias are detected, Skin: There are no ulcer or rashes noted. Psychiatric: The patient has normal affect. Cardiovascular: There is a regular rate and rhythm without significant murmur appreciated.   Diagnostic Studies CT scan today shows a patent stent graft with no evidence of endoleak.  The aneurysm sac has increased slightly from 5.7 previously to 5.9 today.  Assessment: Status post endovascular aneurysm repair Plan: The patient remains asymptomatic.  Her CT scan is unremarkable for endoleak but there has been a slight increase in size of the aneurysm sac.  Maximum diameter today is 5.9.  I discussed these results with the patient.  No intervention is required at this time but I am going to have her follow-up in 6 months with repeat CT scan.  The etiology of the increase in size of the aneurysm is unknown, as there is not an obvious endoleak  V. Leia Alf, M.D. [facility] [place] Office: [phone] Pager:  [phone]

## 2014-06-24 NOTE — Addendum Note (Signed)
Addended by: Mena Goes on: 06/24/2014 01:21 PM   Modules accepted: Orders

## 2014-07-23 ENCOUNTER — Ambulatory Visit (INDEPENDENT_AMBULATORY_CARE_PROVIDER_SITE_OTHER): Payer: Medicare HMO | Admitting: Family Medicine

## 2014-07-23 DIAGNOSIS — I2699 Other pulmonary embolism without acute cor pulmonale: Secondary | ICD-10-CM

## 2014-07-23 DIAGNOSIS — Z5181 Encounter for therapeutic drug level monitoring: Secondary | ICD-10-CM

## 2014-07-23 LAB — POCT INR: INR: 3.3

## 2014-09-10 ENCOUNTER — Ambulatory Visit (INDEPENDENT_AMBULATORY_CARE_PROVIDER_SITE_OTHER): Payer: Medicare HMO | Admitting: General Practice

## 2014-09-10 DIAGNOSIS — I2699 Other pulmonary embolism without acute cor pulmonale: Secondary | ICD-10-CM

## 2014-09-10 DIAGNOSIS — Z5181 Encounter for therapeutic drug level monitoring: Secondary | ICD-10-CM

## 2014-09-10 LAB — POCT INR: INR: 2.9

## 2014-09-10 NOTE — Progress Notes (Signed)
Agree with plan 

## 2014-09-10 NOTE — Progress Notes (Signed)
Pre visit review using our clinic review tool, if applicable. No additional management support is needed unless otherwise documented below in the visit note. 

## 2014-10-22 ENCOUNTER — Ambulatory Visit (INDEPENDENT_AMBULATORY_CARE_PROVIDER_SITE_OTHER): Payer: Medicare HMO | Admitting: General Practice

## 2014-10-22 DIAGNOSIS — Z5181 Encounter for therapeutic drug level monitoring: Secondary | ICD-10-CM | POA: Diagnosis not present

## 2014-10-22 LAB — POCT INR: INR: 3.2

## 2014-10-22 NOTE — Progress Notes (Signed)
Pre visit review using our clinic review tool, if applicable. No additional management support is needed unless otherwise documented below in the visit note. 

## 2014-10-22 NOTE — Progress Notes (Signed)
Agree with plan 

## 2014-11-19 ENCOUNTER — Ambulatory Visit (INDEPENDENT_AMBULATORY_CARE_PROVIDER_SITE_OTHER): Payer: Medicare HMO | Admitting: General Practice

## 2014-11-19 DIAGNOSIS — Z5181 Encounter for therapeutic drug level monitoring: Secondary | ICD-10-CM

## 2014-11-19 LAB — POCT INR: INR: 1.7

## 2014-11-19 NOTE — Progress Notes (Signed)
Pre visit review using our clinic review tool, if applicable. No additional management support is needed unless otherwise documented below in the visit note. 

## 2014-11-19 NOTE — Progress Notes (Signed)
Agree with plan 

## 2014-11-20 ENCOUNTER — Encounter (HOSPITAL_COMMUNITY): Payer: Self-pay

## 2014-11-20 ENCOUNTER — Emergency Department (HOSPITAL_COMMUNITY): Payer: Medicare HMO

## 2014-11-20 ENCOUNTER — Emergency Department (HOSPITAL_COMMUNITY)
Admission: EM | Admit: 2014-11-20 | Discharge: 2014-11-20 | Disposition: A | Discharge status: Home / Self Care | Payer: Medicare HMO | Attending: Emergency Medicine | Admitting: Emergency Medicine

## 2014-11-20 DIAGNOSIS — Z87891 Personal history of nicotine dependence: Secondary | ICD-10-CM | POA: Diagnosis not present

## 2014-11-20 DIAGNOSIS — Z79899 Other long term (current) drug therapy: Secondary | ICD-10-CM | POA: Diagnosis not present

## 2014-11-20 DIAGNOSIS — Z86711 Personal history of pulmonary embolism: Secondary | ICD-10-CM | POA: Insufficient documentation

## 2014-11-20 DIAGNOSIS — M25561 Pain in right knee: Secondary | ICD-10-CM | POA: Insufficient documentation

## 2014-11-20 DIAGNOSIS — E785 Hyperlipidemia, unspecified: Secondary | ICD-10-CM | POA: Insufficient documentation

## 2014-11-20 DIAGNOSIS — I10 Essential (primary) hypertension: Secondary | ICD-10-CM | POA: Insufficient documentation

## 2014-11-20 DIAGNOSIS — E119 Type 2 diabetes mellitus without complications: Secondary | ICD-10-CM | POA: Insufficient documentation

## 2014-11-20 DIAGNOSIS — M25569 Pain in unspecified knee: Secondary | ICD-10-CM

## 2014-11-20 DIAGNOSIS — J449 Chronic obstructive pulmonary disease, unspecified: Secondary | ICD-10-CM | POA: Diagnosis not present

## 2014-11-20 DIAGNOSIS — Z9911 Dependence on respirator [ventilator] status: Secondary | ICD-10-CM | POA: Insufficient documentation

## 2014-11-20 LAB — BASIC METABOLIC PANEL
Anion gap: 10 (ref 5–15)
BUN: 13 mg/dL (ref 6–20)
CALCIUM: 9.3 mg/dL (ref 8.9–10.3)
CO2: 27 mmol/L (ref 22–32)
Chloride: 102 mmol/L (ref 101–111)
Creatinine, Ser: 1.04 mg/dL — ABNORMAL HIGH (ref 0.44–1.00)
GFR, EST AFRICAN AMERICAN: 57 mL/min — AB (ref >60)
GFR, EST NON AFRICAN AMERICAN: 49 mL/min — AB (ref >60)
GLUCOSE: 101 mg/dL — AB (ref 70–99)
Potassium: 4.3 mmol/L (ref 3.5–5.1)
SODIUM: 139 mmol/L (ref 135–145)

## 2014-11-20 LAB — URIC ACID: Uric Acid, Serum: 6.2 mg/dL (ref 2.3–6.6)

## 2014-11-20 MED ORDER — TRAMADOL HCL 50 MG PO TABS: 50.0000 mg | ORAL_TABLET | Freq: Four times a day (QID) | ORAL | Status: DC | PRN

## 2014-11-20 MED ORDER — MELOXICAM 7.5 MG PO TABS: 7.5000 mg | ORAL_TABLET | Freq: Every day | ORAL | Status: DC

## 2014-11-20 NOTE — ED Notes (Signed)
Pt. From home. Complaint of R knee pain x 1 week. Increased soreness starting last PM. Able to bear some weight but with pain. AxO x4.

## 2014-11-20 NOTE — ED Provider Notes (Signed)
CSN: [id number]     Arrival date & time 11/20/14  1157 History   First MD Initiated Contact with Patient 11/20/14 1214     Chief Complaint  Patient presents with  . Knee Pain      HPI  She presents for evaluation of right knee pain. She states that she Last night and she started walking her knee became sore and painful. She tried to sit on the toilet she feels like it may twisted "because it was so painful "stated that during the night. Awakened this morning is walking with a limp because of right knee pain. It is not swollen. It is not red. No history of arthritis or arthralgias. No history of gout.  Past Medical History  Diagnosis Date  . Pulmonary embolism   . COPD (chronic obstructive pulmonary disease)   . DM (diabetes mellitus)   . Dyslipidemia   . Hypertension   . Hyperlipidemia   . AAA (abdominal aortic aneurysm)   . Left foot infection 2013  . 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 Vesicovaginal fistula closure w/ tah    . Endovascular stent insertion  10/30/2011    Procedure: ENDOVASCULAR STENT GRAFT INSERTION;  Surgeon: Serafina Mitchell, MD;  Location: [facility] OR;  Service: Vascular;  Laterality: N/A;  . Abdominal aortic aneurysm repair  10-30-2011   Family History  Problem Relation Age of Onset  . Heart disease Mother   . Hypertension Mother   . Heart attack Mother   . Heart disease Sister     x 3  . Hypertension Sister   . Heart attack Sister   . Clotting disorder Daughter   . Clotting disorder Son     multiple sons  . Diabetes Son   . Hypertension Father   . Diabetes Father   . Heart attack Father   . Hypertension Brother    History  Substance Use Topics  . Smoking status: Former Smoker -- 0.50 packs/day for 22 years    Quit date: 01/23/2010  . Smokeless tobacco: Never Used  . Alcohol Use: No   OB History    No data available     Review of Systems  Constitutional: Negative for fever, chills, diaphoresis,  appetite change and fatigue.  HENT: Negative for mouth sores, sore throat and trouble swallowing.   Eyes: Negative for visual disturbance.  Respiratory: Negative for cough, chest tightness, shortness of breath and wheezing.   Cardiovascular: Negative for chest pain.  Gastrointestinal: Negative for nausea, vomiting, abdominal pain, diarrhea and abdominal distention.  Endocrine: Negative for polydipsia, polyphagia and polyuria.  Genitourinary: Negative for dysuria, frequency and hematuria.  Musculoskeletal: Negative for gait problem.       Right knee pain  Skin: Negative for color change, pallor and rash.  Neurological: Negative for dizziness, syncope, light-headedness and headaches.  Hematological: Does not bruise/bleed easily.  Psychiatric/Behavioral: Negative for behavioral problems and confusion.      Allergies  Penicillins  Home Medications   Prior to Admission medications   Medication Sig Start Date End Date Taking? Authorizing Provider  albuterol (PROVENTIL) (2.5 MG/3ML) 0.083% nebulizer solution Take 2.5 mg by nebulization every 6 (six) hours as needed. For shortness of breath 05/07/11   Brand Males, MD  Alcohol Swabs (ALCOHOL PREP) 70 % PADS  03/29/12   Historical Provider, MD  aspirin 81 MG tablet Take 81 mg by mouth daily.      Historical Provider, MD  Cholecalciferol (VITAMIN  D3) 1000 UNITS CAPS Take 1 capsule by mouth daily.      Historical Provider, MD  EASY COMFORT INSULIN SYRINGE 30G X 1/2" 0.5 ML MISC  03/29/12   Historical Provider, MD  glipiZIDE (GLUCOTROL) 5 MG tablet Take 5 mg by mouth daily.     Historical Provider, MD  glucose blood test strip  05/24/12   Historical Provider, MD  HYDROcodone-acetaminophen (NORCO) 5-325 MG per tablet Take 1 tablet by mouth 4 (four) times daily as needed. For pain     Historical Provider, MD  Lancet Devices (LANCING DEVICE) MISC  03/29/12   Historical Provider, MD  Lancets Glory Rosebush ULTRASOFT) lancets Use as directed     Historical Provider, MD  lovastatin (MEVACOR) 20 MG tablet Take 20 mg by mouth at bedtime.      Historical Provider, MD  metFORMIN (GLUCOPHAGE) 500 MG tablet Take 500 mg by mouth 2 (two) times daily with a meal.     Historical Provider, MD  metoprolol (LOPRESSOR) 50 MG tablet Take 1 tablet by mouth 2 (two) times daily.  10/22/10   Historical Provider, MD  Multiple Vitamin (MULTIVITAMIN) tablet Take 1 tablet by mouth daily.      Historical Provider, MD  omega-3 acid ethyl esters (LOVAZA) 1 G capsule Take 2 g by mouth 2 (two) times daily.    Historical Provider, MD  ONE TOUCH ULTRA TEST test strip Use as directed    Historical Provider, MD  pantoprazole (PROTONIX) 40 MG tablet  05/26/12   Historical Provider, MD  tiotropium (SPIRIVA) 18 MCG inhalation capsule Place 18 mcg into inhaler and inhale daily as needed.     Historical Provider, MD  triamterene-hydrochlorothiazide (MAXZIDE-25) 37.5-25 MG per tablet Take 1 tablet by mouth daily.      Historical Provider, MD  warfarin (COUMADIN) 5 MG tablet Take 5 mg by mouth as directed. Take at 6pm on an empty stomach    Historical Provider, MD   BP 155/76 mmHg  Pulse 74  Temp(Src) 97.8 F (36.6 C) (Oral)  Resp 18  SpO2 96% Physical Exam  Constitutional: She is oriented to person, place, and time. She appears well-developed and well-nourished. No distress.  HENT:  Head: Normocephalic.  Eyes: Conjunctivae are normal. Pupils are equal, round, and reactive to light. No scleral icterus.  Neck: Normal range of motion. Neck supple. No thyromegaly present.  Cardiovascular: Normal rate and regular rhythm.  Exam reveals no gallop and no friction rub.   No murmur heard. Pulmonary/Chest: Effort normal and breath sounds normal. No respiratory distress. She has no wheezes. She has no rales.  Abdominal: Soft. Bowel sounds are normal. She exhibits no distension. There is no tenderness. There is no rebound.  Musculoskeletal: Normal range of motion.        Legs: Neurological: She is alert and oriented to person, place, and time.  Skin: Skin is warm and dry. No rash noted.  Psychiatric: She has a normal mood and affect. Her behavior is normal.    ED Course  Procedures (including critical care time) Labs Review Labs Reviewed  BASIC METABOLIC PANEL - Abnormal; Notable for the following:    Glucose, Bld 101 (*)    Creatinine, Ser 1.04 (*)    GFR calc non Af Amer 49 (*)    GFR calc Af Amer 57 (*)    All other components within normal limits  URIC ACID    Imaging Review Dg Knee Complete 4 Views Right  11/20/2014   CLINICAL DATA:  Right  posterior knee pain and swelling for a while with worsening this morning  EXAM: RIGHT KNEE - COMPLETE 4+ VIEW  COMPARISON:  None.  FINDINGS: Four views of the right knee submitted. No acute fracture or subluxation. Minimal narrowing of medial joint compartment. Mild chondrocalcinosis. Mild spurring of lateral tibial plateau. There is narrowing of patellofemoral joint space. Mild spurring of patella. Atherosclerotic calcifications of femoral and popliteal artery.  IMPRESSION: No acute fracture or subluxation. Mild degenerative changes as described above. Mild chondrocalcinosis.   Electronically Signed   By: Lahoma Crocker M.D.   On: 11/20/2014 13:12     EKG Interpretation None      MDM   Final diagnoses:  Knee pain    Normal sedimentation rate. Afebrile. Need is not appear warm, red, no effusion clinically, radiographically. Negative uric acid. Plan will be short course small dose daily anti-inflammatory. Minimize weightbearing. Primary care follow-up.    Tanna Furry, MD 11/20/14 [phone]

## 2014-11-20 NOTE — Discharge Instructions (Signed)
Arthritis, Nonspecific °Arthritis is inflammation of a joint. This usually means pain, redness, warmth or swelling are present. One or more joints may be involved. There are a number of types of arthritis. Your caregiver may not be able to tell what type of arthritis you have right away. °CAUSES  °The most common cause of arthritis is the wear and tear on the joint (osteoarthritis). This causes damage to the cartilage, which can break down over time. The knees, hips, back and neck are most often affected by this type of arthritis. °Other types of arthritis and common causes of joint pain include: °· Sprains and other injuries near the joint. Sometimes minor sprains and injuries cause pain and swelling that develop hours later. °· Rheumatoid arthritis. This affects hands, feet and knees. It usually affects both sides of your body at the same time. It is often associated with chronic ailments, fever, weight loss and general weakness. °· Crystal arthritis. Gout and pseudo gout can cause occasional acute severe pain, redness and swelling in the foot, ankle, or knee. °· Infectious arthritis. Bacteria can get into a joint through a break in overlying skin. This can cause infection of the joint. Bacteria and viruses can also spread through the blood and affect your joints. °· Drug, infectious and allergy reactions. Sometimes joints can become mildly painful and slightly swollen with these types of illnesses. °SYMPTOMS  °· Pain is the main symptom. °· Your joint or joints can also be red, swollen and warm or hot to the touch. °· You may have a fever with certain types of arthritis, or even feel overall ill. °· The joint with arthritis will hurt with movement. Stiffness is present with some types of arthritis. °DIAGNOSIS  °Your caregiver will suspect arthritis based on your description of your symptoms and on your exam. Testing may be needed to find the type of arthritis: °· Blood and sometimes urine tests. °· X-ray tests  and sometimes CT or MRI scans. °· Removal of fluid from the joint (arthrocentesis) is done to check for bacteria, crystals or other causes. Your caregiver (or a specialist) will numb the area over the joint with a local anesthetic, and use a needle to remove joint fluid for examination. This procedure is only minimally uncomfortable. °· Even with these tests, your caregiver may not be able to tell what kind of arthritis you have. Consultation with a specialist (rheumatologist) may be helpful. °TREATMENT  °Your caregiver will discuss with you treatment specific to your type of arthritis. If the specific type cannot be determined, then the following general recommendations may apply. °Treatment of severe joint pain includes: °· Rest. °· Elevation. °· Anti-inflammatory medication (for example, ibuprofen) may be prescribed. Avoiding activities that cause increased pain. °· Only take over-the-counter or prescription medicines for pain and discomfort as recommended by your caregiver. °· Cold packs over an inflamed joint may be used for 10 to 15 minutes every hour. Hot packs sometimes feel better, but do not use overnight. Do not use hot packs if you are diabetic without your caregiver's permission. °· A cortisone shot into arthritic joints may help reduce pain and swelling. °· Any acute arthritis that gets worse over the next 1 to 2 days needs to be looked at to be sure there is no joint infection. °Long-term arthritis treatment involves modifying activities and lifestyle to reduce joint stress jarring. This can include weight loss. Also, exercise is needed to nourish the joint cartilage and remove waste. This helps keep the muscles   around the joint strong. °HOME CARE INSTRUCTIONS  °· Do not take aspirin to relieve pain if gout is suspected. This elevates uric acid levels. °· Only take over-the-counter or prescription medicines for pain, discomfort or fever as directed by your caregiver. °· Rest the joint as much as  possible. °· If your joint is swollen, keep it elevated. °· Use crutches if the painful joint is in your leg. °· Drinking plenty of fluids may help for certain types of arthritis. °· Follow your caregiver's dietary instructions. °· Try low-impact exercise such as: °¨ Swimming. °¨ Water aerobics. °¨ Biking. °¨ Walking. °· Morning stiffness is often relieved by a warm shower. °· Put your joints through regular range-of-motion. °SEEK MEDICAL CARE IF:  °· You do not feel better in 24 hours or are getting worse. °· You have side effects to medications, or are not getting better with treatment. °SEEK IMMEDIATE MEDICAL CARE IF:  °· You have a fever. °· You develop severe joint pain, swelling or redness. °· Many joints are involved and become painful and swollen. °· There is severe back pain and/or leg weakness. °· You have loss of bowel or bladder control. °Document Released: 08/12/2004 Document Revised: 09/27/2011 Document Reviewed: 08/28/2008 °Exit[employer]® Patient Information ©2015 [employer]. This information is not intended to replace advice given to you by your health care provider. Make sure you discuss any questions you have with your health care provider. ° °

## 2014-12-10 ENCOUNTER — Ambulatory Visit (INDEPENDENT_AMBULATORY_CARE_PROVIDER_SITE_OTHER): Payer: Medicare HMO | Admitting: Family

## 2014-12-10 ENCOUNTER — Encounter: Payer: Self-pay | Admitting: Family

## 2014-12-10 VITALS — BP 134/92 | HR 77 | Temp 97.6°F | Resp 18 | Ht 65.5 in | Wt 169.1 lb

## 2014-12-10 DIAGNOSIS — M25561 Pain in right knee: Secondary | ICD-10-CM

## 2014-12-10 DIAGNOSIS — M25569 Pain in unspecified knee: Secondary | ICD-10-CM | POA: Insufficient documentation

## 2014-12-10 NOTE — Assessment & Plan Note (Addendum)
Right knee pain most likely related to a mild sprain/strain however cannot rule out arthritis. Continue conservative treatment with ice at night and heat during the day as well as a compression sleeve. Continue previously prescribed medication of tramadol. Discussed risk of meloxicam with patient advised caution with Coumadin and increased chance of bleeding. Follow-up if symptoms worsen or fail to improve.

## 2014-12-10 NOTE — Progress Notes (Signed)
Subjective:    Patient ID: Alejandra [name], female    DOB: 05/20/1932, 81 y.o.   MRN: [id number]  Chief Complaint  Patient presents with  . Establish Care    Right knee pain and right foot swelling, x3 weeks    HPI:  Alejandra [name] is a 81 y.o. female with a PMH of hypertension, pulmonary embolism, COPD, dyslipidemia, and abdominal aortic aneurysm repair who presents today for an office visit to establish care.   1.) Right knee - Associated symptoms of pain located in her right knee started about 3 weeks ago. She was getting up when she twisted her right knee. Did not think anything at the time and woke up and noticed that her right knee was swollen. Desribes the pain as sharp with a severity of 8/10. Modifying factors include . Was previously seen in the ED on 5/4 for similar problems. X-ray imaging revealed no acute fracture or subluxation with mild degenerative changes. Diagnosed as knee pain and provided tramadol and mobic. Indicates that the tramadol does help with her pain.    Allergies  Allergen Reactions  . Penicillins Nausea And Vomiting, Rash and Other (See Comments)    Childhood reaction    Outpatient Prescriptions Prior to Visit  Medication Sig Dispense Refill  . albuterol (PROVENTIL) (2.5 MG/3ML) 0.083% nebulizer solution Take 2.5 mg by nebulization every 6 (six) hours as needed. For shortness of breath    . Alcohol Swabs (ALCOHOL PREP) 70 % PADS     . aspirin 81 MG tablet Take 81 mg by mouth daily.      . Cholecalciferol (VITAMIN D3) 1000 UNITS CAPS Take 1 capsule by mouth daily.      Marland Kitchen EASY COMFORT INSULIN SYRINGE 30G X 1/2" 0.5 ML MISC     . glipiZIDE (GLUCOTROL) 5 MG tablet Take 5 mg by mouth daily.     Marland Kitchen glucose blood test strip     . HYDROcodone-acetaminophen (NORCO) 5-325 MG per tablet Take 1 tablet by mouth 4 (four) times daily as needed. For pain     . Lancet Devices (LANCING DEVICE) MISC     . Lancets (ONETOUCH ULTRASOFT) lancets Use as directed    .  lovastatin (MEVACOR) 20 MG tablet Take 20 mg by mouth at bedtime.      . meloxicam (MOBIC) 7.5 MG tablet Take 1 tablet (7.5 mg total) by mouth daily. 5 tablet 0  . metFORMIN (GLUCOPHAGE) 500 MG tablet Take 500 mg by mouth 2 (two) times daily with a meal.     . metoprolol (LOPRESSOR) 50 MG tablet Take 1 tablet by mouth 2 (two) times daily.     . Multiple Vitamin (MULTIVITAMIN) tablet Take 1 tablet by mouth daily.      Marland Kitchen omega-3 acid ethyl esters (LOVAZA) 1 G capsule Take 2 g by mouth 2 (two) times daily.    . ONE TOUCH ULTRA TEST test strip Use as directed    . pantoprazole (PROTONIX) 40 MG tablet     . tiotropium (SPIRIVA) 18 MCG inhalation capsule Place 18 mcg into inhaler and inhale daily as needed.     . traMADol (ULTRAM) 50 MG tablet Take 1 tablet (50 mg total) by mouth every 6 (six) hours as needed. 15 tablet 0  . triamterene-hydrochlorothiazide (MAXZIDE-25) 37.5-25 MG per tablet Take 1 tablet by mouth daily.      Marland Kitchen warfarin (COUMADIN) 5 MG tablet Take 5 mg by mouth as directed. Take at 6pm on an empty  stomach     No facility-administered medications prior to visit.     Past Medical History  Diagnosis Date  . Pulmonary embolism   . COPD (chronic obstructive pulmonary disease)   . DM (diabetes mellitus)   . Dyslipidemia   . Hypertension   . Hyperlipidemia   . AAA (abdominal aortic aneurysm)   . Left foot infection 2013  . 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 2013     Past Surgical History  Procedure Laterality Date  . Vesicovaginal fistula closure w/ tah    . Endovascular stent insertion  10/30/2011    Procedure: ENDOVASCULAR STENT GRAFT INSERTION;  Surgeon: Serafina Mitchell, MD;  Location: [facility] OR;  Service: Vascular;  Laterality: N/A;  . Abdominal aortic aneurysm repair  10-30-2011     Family History  Problem Relation Age of Onset  . Heart disease Mother   . Hypertension Mother   . Heart attack Mother   . Heart disease Sister     x 3  . Hypertension Sister   . Heart attack Sister   .  Clotting disorder Daughter   . Clotting disorder Son     multiple sons  . Diabetes Son   . Hypertension Father   . Diabetes Father   . Heart attack Father   . Hypertension Brother      History   Social History  . Marital Status: Widowed    Spouse Name: N/A  . Number of Children: 2  . Years of Education: 12   Occupational History  . retired     Building surveyor   Social History Main Topics  . Smoking status: Former Smoker -- 0.50 packs/day for 41 years    Quit date: 01/23/2010  . Smokeless tobacco: Never Used  . Alcohol Use: No  . Drug Use: No  . Sexual Activity: Not on file   Other Topics Concern  . Not on file   Social History Narrative   Fun: Goes to church, Watch TV   Denies religious beliefs effecting health care.     Review of Systems  Cardiovascular: Positive for leg swelling.  Musculoskeletal: Positive for joint swelling and arthralgias (Right knee pain).  Neurological: Negative for weakness and numbness.      Objective:    BP 134/92 mmHg  Pulse 77  Temp(Src) 97.6 F (36.4 C) (Oral)  Resp 18  Ht 5' 5.5" (1.664 m)  Wt 169 lb 1.9 oz (76.712 kg)  BMI 27.70 kg/m2  SpO2 96% Nursing note and vital signs reviewed.  Physical Exam  Constitutional: She is oriented to person, place, and time. She appears well-developed and well-nourished. No distress.  Cardiovascular: Normal rate, regular rhythm, normal heart sounds and intact distal pulses.   Pulmonary/Chest: Effort normal and breath sounds normal.  Musculoskeletal:  Right knee: No obvious deformity or discoloration. Mild edema present. Palpable tenderness along anterior and lateral joint line. Full range of motion in both flexion and extension. Strength is 4+ out of 5 bilaterally. Negative valgus/varus. Negative anterior drawer. Negative McMurray's.  Neurological: She is alert and oriented to person, place, and time.  Skin: Skin is warm and dry.  Psychiatric: She has a normal mood and affect. Her  behavior is normal. Judgment and thought content normal.       Assessment & Plan:   Problem List Items Addressed This Visit      Other   Knee pain - Primary    Right knee pain most likely related to a mild sprain/strain however cannot rule  out arthritis. Continue conservative treatment with ice at night and heat during the day as well as a compression sleeve. Continue previously prescribed medication of tramadol. Discussed risk of meloxicam with patient advised caution with Coumadin and increased chance of bleeding. Follow-up if symptoms worsen or fail to improve.

## 2014-12-10 NOTE — Progress Notes (Signed)
Pre visit review using our clinic review tool, if applicable. No additional management support is needed unless otherwise documented below in the visit note. 

## 2014-12-10 NOTE — Patient Instructions (Addendum)
Thank you for choosing [facility].  Summary/Instructions:  Please continue with ice/heat and recommend compression with a knee sleeve. Ice at night and heat in the morning.   If your symptoms worsen or fail to improve, please contact our office for further instruction, or in case of emergency go directly to the emergency room at the closest medical facility.    Knee Sprain A knee sprain is a tear in one of the strong, fibrous tissues that connect the bones (ligaments) in your knee. The severity of the sprain depends on how much of the ligament is torn. The tear can be either partial or complete. CAUSES  Often, sprains are a result of a fall or injury. The force of the impact causes the fibers of your ligament to stretch too much. This excess tension causes the fibers of your ligament to tear. SIGNS AND SYMPTOMS  You may have some loss of motion in your knee. Other symptoms include:  Bruising.  Pain in the knee area.  Tenderness of the knee to the touch.  Swelling. DIAGNOSIS  To diagnose a knee sprain, your health care provider will physically examine your knee. Your health care provider may also suggest an X-ray exam of your knee to make sure no bones are broken. TREATMENT  If your ligament is only partially torn, treatment usually involves keeping the knee in a fixed position (immobilization) or bracing your knee for activities that require movement for several weeks. To do this, your health care provider will apply a bandage, cast, or splint to keep your knee from moving and to support your knee during movement until it heals. For a partially torn ligament, the healing process usually takes 4-6 weeks. If your ligament is completely torn, depending on which ligament it is, you may need surgery to reconnect the ligament to the bone or reconstruct it. After surgery, a cast or splint may be applied and will need to stay on your knee for 4-6 weeks while your ligament heals. HOME CARE  INSTRUCTIONS  Keep your injured knee elevated to decrease swelling.  To ease pain and swelling, apply ice to the injured area:  Put ice in a plastic bag.  Place a towel between your skin and the bag.  Leave the ice on for 20 minutes, 2-3 times a day.  Only take medicine for pain as directed by your health care provider.  Do not leave your knee unprotected until pain and stiffness go away (usually 4-6 weeks).  If you have a cast or splint, do not allow it to get wet. If you have been instructed not to remove it, cover it with a plastic bag when you shower or bathe. Do not swim.  Your health care provider may suggest exercises for you to do during your recovery to prevent or limit permanent weakness and stiffness. SEEK IMMEDIATE MEDICAL CARE IF:  Your cast or splint becomes damaged.  Your pain becomes worse.  You have significant pain, swelling, or numbness below the cast or splint. MAKE SURE YOU:  Understand these instructions.  Will watch your condition.  Will get help right away if you are not doing well or get worse. Document Released: 111-11-2004 Document Revised: 04/25/2013 Document Reviewed: 02/14/2013 [facility] Patient Information 2015 [place], [place]. This information is not intended to replace advice given to you by your health care provider. Make sure you discuss any questions you have with your health care provider.

## 2014-12-17 ENCOUNTER — Ambulatory Visit (INDEPENDENT_AMBULATORY_CARE_PROVIDER_SITE_OTHER): Payer: Medicare HMO | Admitting: General Practice

## 2014-12-17 DIAGNOSIS — Z5181 Encounter for therapeutic drug level monitoring: Secondary | ICD-10-CM

## 2014-12-17 LAB — POCT INR: INR: 3.4

## 2014-12-17 NOTE — Progress Notes (Signed)
I have reviewed and agree with the plan. 

## 2014-12-17 NOTE — Progress Notes (Signed)
Pre visit review using our clinic review tool, if applicable. No additional management support is needed unless otherwise documented below in the visit note. 

## 2014-12-25 ENCOUNTER — Encounter: Payer: Self-pay | Admitting: Surgery

## 2014-12-30 ENCOUNTER — Encounter: Payer: Self-pay | Admitting: Surgery

## 2014-12-30 ENCOUNTER — Ambulatory Visit
Admission: RE | Admit: 2014-12-30 | Discharge: 2014-12-30 | Disposition: A | Discharge status: Home / Self Care | Payer: Medicare HMO | Source: Ambulatory Visit | Attending: Surgery | Admitting: Surgery

## 2014-12-30 ENCOUNTER — Ambulatory Visit (INDEPENDENT_AMBULATORY_CARE_PROVIDER_SITE_OTHER): Payer: Medicare HMO | Admitting: Surgery

## 2014-12-30 VITALS — BP 135/82 | HR 75 | Ht 65.5 in | Wt 169.0 lb

## 2014-12-30 DIAGNOSIS — Z9889 Other specified postprocedural states: Secondary | ICD-10-CM

## 2014-12-30 DIAGNOSIS — I714 Abdominal aortic aneurysm, without rupture, unspecified: Secondary | ICD-10-CM

## 2014-12-30 DIAGNOSIS — R918 Other nonspecific abnormal finding of lung field: Secondary | ICD-10-CM | POA: Diagnosis not present

## 2014-12-30 MED ORDER — IOPAMIDOL (ISOVUE-370) INJECTION 76%
75.0000 mL | Freq: Once | INTRAVENOUS | Status: AC | PRN
  Administered 2014-12-30: 75 mL via INTRAVENOUS

## 2014-12-30 NOTE — Progress Notes (Signed)
Patient name: Alejandra [name] MRN: [id number] DOB: April 20, 1932 Sex: female     Chief Complaint  Patient presents with  . Re-evaluation    6 month f/u - CTA abd/pel prior    HISTORY OF PRESENT ILLNESS: Patient is status post endovascular aneurysm repair on 10/30/2011 for a 6.6 cm aneurysm.  Recently, she had a ultrasound that showed a increase in the size of her aneurysm and therefore she had repeat CT scan imaging.  Her most recent CT scan showed a 5.9 cm aneurysm with no evidence of endoleak.  She is back for follow-up.  She reports no interval changes.  Past Medical History  Diagnosis Date  . Pulmonary embolism   . COPD (chronic obstructive pulmonary disease)   . DM (diabetes mellitus)   . Dyslipidemia   . Hypertension   . Hyperlipidemia   . AAA (abdominal aortic aneurysm)   . Left foot infection 2013  . 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 Vesicovaginal fistula closure w/ tah    . Endovascular stent insertion  10/30/2011    Procedure: ENDOVASCULAR STENT GRAFT INSERTION;  Surgeon: Serafina Mitchell, MD;  Location: [facility] OR;  Service: Vascular;  Laterality: N/A;  . Abdominal aortic aneurysm repair  10-30-2011    History   Social History  . Marital Status: Widowed    Spouse Name: N/A  . Number of Children: 2  . Years of Education: 12   Occupational History  . retired     Building surveyor   Social History Main Topics  . Smoking status: Former Smoker -- 0.50 packs/day for 42 years    Quit date: 01/23/2010  . Smokeless tobacco: Never Used  . Alcohol Use: No  . Drug Use: No  . Sexual Activity: Not on file   Other Topics Concern  . Not on file   Social History Narrative   Fun: Goes to church, Watch TV   Denies religious beliefs effecting health care.     Family History  Problem Relation Age of Onset  . Heart disease Mother   . Hypertension Mother   . Heart attack Mother   . Heart disease Sister     x 3  .  Hypertension Sister   . Heart attack Sister   . Clotting disorder Daughter   . Clotting disorder Son     multiple sons  . Diabetes Son   . Hypertension Father   . Diabetes Father   . Heart attack Father   . Hypertension Brother     Allergies as of 12/30/2014 - Review Complete 12/30/2014  Allergen Reaction Noted  . Penicillins Nausea And Vomiting, Rash, and Other (See Comments)     Current Outpatient Prescriptions on File Prior to Visit  Medication Sig Dispense Refill  . albuterol (PROVENTIL) (2.5 MG/3ML) 0.083% nebulizer solution Take 2.5 mg by nebulization every 6 (six) hours as needed. For shortness of breath    . Alcohol Swabs (ALCOHOL PREP) 70 % PADS     . aspirin 81 MG tablet Take 81 mg by mouth daily.      . Cholecalciferol (VITAMIN D3) 1000 UNITS CAPS Take 1 capsule by mouth daily.      Marland Kitchen EASY COMFORT INSULIN SYRINGE 30G X 1/2" 0.5 ML MISC     . glipiZIDE (GLUCOTROL) 5 MG tablet Take 5 mg by mouth daily.     Marland Kitchen glucose blood test strip     . HYDROcodone-acetaminophen (  NORCO) 5-325 MG per tablet Take 1 tablet by mouth 4 (four) times daily as needed. For pain     . Lancet Devices (LANCING DEVICE) MISC     . Lancets (ONETOUCH ULTRASOFT) lancets Use as directed    . lovastatin (MEVACOR) 20 MG tablet Take 20 mg by mouth at bedtime.      . meloxicam (MOBIC) 7.5 MG tablet Take 1 tablet (7.5 mg total) by mouth daily. 5 tablet 0  . metFORMIN (GLUCOPHAGE) 500 MG tablet Take 500 mg by mouth 2 (two) times daily with a meal.     . metoprolol (LOPRESSOR) 50 MG tablet Take 1 tablet by mouth 2 (two) times daily.     . Multiple Vitamin (MULTIVITAMIN) tablet Take 1 tablet by mouth daily.      Marland Kitchen omega-3 acid ethyl esters (LOVAZA) 1 G capsule Take 2 g by mouth 2 (two) times daily.    . ONE TOUCH ULTRA TEST test strip Use as directed    . pantoprazole (PROTONIX) 40 MG tablet     . tiotropium (SPIRIVA) 18 MCG inhalation capsule Place 18 mcg into inhaler and inhale daily as needed.     .  traMADol (ULTRAM) 50 MG tablet Take 1 tablet (50 mg total) by mouth every 6 (six) hours as needed. 15 tablet 0  . triamterene-hydrochlorothiazide (MAXZIDE-25) 37.5-25 MG per tablet Take 1 tablet by mouth daily.      Marland Kitchen warfarin (COUMADIN) 5 MG tablet Take 5 mg by mouth as directed. Take at 6pm on an empty stomach     No current facility-administered medications on file prior to visit.     REVIEW OF SYSTEMS: Cardiovascular: Positive for shortness of breath when lying flat and with exertion.  Positive for pain in her legs and leg swelling. Pulmonary: No productive cough, asthma or wheezing. Neurologic: No weakness, paresthesias, aphasia, or amaurosis. No dizziness. Hematologic: No bleeding problems or clotting disorders. Musculoskeletal: No joint pain or joint swelling. Gastrointestinal: No blood in stool or hematemesis Genitourinary: No dysuria or hematuria. Psychiatric:: No history of major depression. Integumentary: No rashes or ulcers. Constitutional: No fever or chills.  PHYSICAL EXAMINATION:   Vital signs are  Filed Vitals:   12/30/14 1119  BP: 135/82  Pulse: 75  Height: 5' 5.5" (1.664 m)  Weight: 169 lb (76.658 kg)  SpO2: 90%   Body mass index is 27.69 kg/(m^2). General: The patient appears their stated age. HEENT:  No gross abnormalities Pulmonary:  Non labored breathing Musculoskeletal: There are no major deformities. Neurologic: No focal weakness or paresthesias are detected, Skin: There are no ulcer or rashes noted. Psychiatric: The patient has normal affect. Cardiovascular: There is a regular rate and rhythm without significant murmur appreciated.   Diagnostic Studies I have reviewed her CT angiogram with the following findings Abdominal aorto bi-iliac stent graft as described which is patent with some intimal hyperplasia in the left iliac limb. There is no evidence of endoleak. Aneurysm sac today measures 5.7 x 5.7 cm and previously measured 5.8 x 5.9 cm.  In  addition there was a indeterminant pulmonary opacity of the right lung base.  Three-month CT scan is recommended Assessment: Status post endovascular aneurysm repair Plan: The patient will follow-up in one year with a ultrasound of her aneurysm.  Today CT scan shows a decrease in the size of the aneurysm sac with no evidence of endoleak.  I discussed the lung nodule findings on CT scan today.  Recommendations are for repeat scan in 3 months.  The patient will follow-up with me afterwards.  Eldridge Abrahams, M.D. [facility] Office: [phone] Pager:  [phone]

## 2015-01-14 ENCOUNTER — Ambulatory Visit: Payer: Medicare HMO

## 2015-01-14 ENCOUNTER — Ambulatory Visit (INDEPENDENT_AMBULATORY_CARE_PROVIDER_SITE_OTHER): Payer: Medicare HMO | Admitting: General Practice

## 2015-01-14 DIAGNOSIS — Z5181 Encounter for therapeutic drug level monitoring: Secondary | ICD-10-CM

## 2015-01-14 LAB — POCT INR: INR: 1.5

## 2015-01-14 NOTE — Progress Notes (Signed)
Pre visit review using our clinic review tool, if applicable. No additional management support is needed unless otherwise documented below in the visit note. 

## 2015-01-20 NOTE — Progress Notes (Signed)
I have reviewed and agree with the plan. 

## 2015-02-04 ENCOUNTER — Ambulatory Visit (INDEPENDENT_AMBULATORY_CARE_PROVIDER_SITE_OTHER): Payer: Medicare HMO | Admitting: General Practice

## 2015-02-04 DIAGNOSIS — Z5181 Encounter for therapeutic drug level monitoring: Secondary | ICD-10-CM

## 2015-02-04 LAB — POCT INR: INR: 4.3

## 2015-02-04 NOTE — Progress Notes (Signed)
I have reviewed and agree with the plan. 

## 2015-02-04 NOTE — Progress Notes (Signed)
Pre visit review using our clinic review tool, if applicable. No additional management support is needed unless otherwise documented below in the visit note. 

## 2015-02-10 ENCOUNTER — Telehealth: Payer: Self-pay

## 2015-02-10 NOTE — Telephone Encounter (Signed)
Call to intro AWV; line was a fax line. Call to Son; Mr. Alejandra [name] listed as contact on the demographic screen. He was driving and agreed to outreach tomorrow.

## 2015-02-13 NOTE — Telephone Encounter (Signed)
Call to son listed in Demographics to discuss AWV. Son stated his mother is very compliant with her apt. Discussed the purpose of the AWV and the son agreed to let his mother know and  -she will call me back at [phone] if she would like to schedule the exam

## 2015-03-04 ENCOUNTER — Ambulatory Visit (INDEPENDENT_AMBULATORY_CARE_PROVIDER_SITE_OTHER): Payer: Medicare HMO | Admitting: General Practice

## 2015-03-04 DIAGNOSIS — Z5181 Encounter for therapeutic drug level monitoring: Secondary | ICD-10-CM | POA: Diagnosis not present

## 2015-03-04 LAB — POCT INR: INR: 2.1

## 2015-03-04 NOTE — Progress Notes (Signed)
I have reviewed and agree with the plan. 

## 2015-03-04 NOTE — Progress Notes (Signed)
Pre visit review using our clinic review tool, if applicable. No additional management support is needed unless otherwise documented below in the visit note. 

## 2015-04-01 ENCOUNTER — Ambulatory Visit (INDEPENDENT_AMBULATORY_CARE_PROVIDER_SITE_OTHER): Payer: Medicare HMO | Admitting: General Practice

## 2015-04-01 DIAGNOSIS — Z5181 Encounter for therapeutic drug level monitoring: Secondary | ICD-10-CM | POA: Diagnosis not present

## 2015-04-01 LAB — POCT INR: INR: 3.1

## 2015-04-01 NOTE — Progress Notes (Signed)
I have reviewed and agree with the plan. 

## 2015-04-01 NOTE — Progress Notes (Signed)
Pre visit review using our clinic review tool, if applicable. No additional management support is needed unless otherwise documented below in the visit note. 

## 2015-04-07 ENCOUNTER — Ambulatory Visit
Admission: RE | Admit: 2015-04-07 | Discharge: 2015-04-07 | Disposition: A | Discharge status: Home / Self Care | Payer: Medicare HMO | Source: Ambulatory Visit | Attending: Surgery | Admitting: Surgery

## 2015-04-07 DIAGNOSIS — R918 Other nonspecific abnormal finding of lung field: Secondary | ICD-10-CM

## 2015-04-09 ENCOUNTER — Encounter: Payer: Self-pay | Admitting: Surgery

## 2015-04-14 ENCOUNTER — Ambulatory Visit (INDEPENDENT_AMBULATORY_CARE_PROVIDER_SITE_OTHER): Payer: Medicare HMO | Admitting: Surgery

## 2015-04-14 ENCOUNTER — Encounter: Payer: Self-pay | Admitting: Surgery

## 2015-04-14 VITALS — BP 145/90 | HR 83 | Temp 97.5°F | Ht 65.5 in | Wt 169.5 lb

## 2015-04-14 DIAGNOSIS — Z9889 Other specified postprocedural states: Secondary | ICD-10-CM | POA: Diagnosis not present

## 2015-04-14 NOTE — Progress Notes (Signed)
Patient name: Alejandra [name] MRN: [id number] DOB: 01/08/1932 Sex: female     Chief Complaint  Patient presents with  . Re-evaluation    3 month f/u AAA    HISTORY OF PRESENT ILLNESS:  the patient is back for follow-up.  She underwent endovascular aneurysm repair on 10/30/1998 1346.6 cm aneurysm.  There was confusion placed by ultrasound that the aneurysm may have increased in size.  Therefore I sent her for a CT scan which revealed at the aneurysm sac has decreased down to 5.7 cm with no evidence of endoleak.  Again seen was indeterminant pulmonary opacity in the right lung base for which CT scan follow-up was recommended.  She is back today for follow-up of her lung nodule.  She reports no interval changes.  She denies chest pain or abdominal pain.  Past Medical History  Diagnosis Date  . Pulmonary embolism   . COPD (chronic obstructive pulmonary disease)   . DM (diabetes mellitus)   . Dyslipidemia   . Hypertension   . Hyperlipidemia   . AAA (abdominal aortic aneurysm)   . Left foot infection 2013  . 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 Vesicovaginal fistula closure w/ tah    . Endovascular stent insertion  10/30/2011    Procedure: ENDOVASCULAR STENT GRAFT INSERTION;  Surgeon: Serafina Mitchell, MD;  Location: [facility] OR;  Service: Vascular;  Laterality: N/A;  . Abdominal aortic aneurysm repair  10-30-2011    Social History   Social History  . Marital Status: Widowed    Spouse Name: N/A  . Number of Children: 2  . Years of Education: 12   Occupational History  . retired     Building surveyor   Social History Main Topics  . Smoking status: Former Smoker -- 0.50 packs/day for 56 years    Quit date: 01/23/2010  . Smokeless tobacco: Never Used  . Alcohol Use: No  . Drug Use: No  . Sexual Activity: Not on file   Other Topics Concern  . Not on file   Social History Narrative   Fun: Goes to church, Watch TV   Denies religious  beliefs effecting health care.     Family History  Problem Relation Age of Onset  . Heart disease Mother   . Hypertension Mother   . Heart attack Mother   . Heart disease Sister     x 3  . Hypertension Sister   . Heart attack Sister   . Clotting disorder Daughter   . Clotting disorder Son     multiple sons  . Diabetes Son   . Hypertension Father   . Diabetes Father   . Heart attack Father   . Hypertension Brother     Allergies as of 04/14/2015 - Review Complete 04/14/2015  Allergen Reaction Noted  . Penicillins Nausea And Vomiting, Rash, and Other (See Comments)     Current Outpatient Prescriptions on File Prior to Visit  Medication Sig Dispense Refill  . albuterol (PROVENTIL) (2.5 MG/3ML) 0.083% nebulizer solution Take 2.5 mg by nebulization every 6 (six) hours as needed. For shortness of breath    . Alcohol Swabs (ALCOHOL PREP) 70 % PADS     . aspirin 81 MG tablet Take 81 mg by mouth daily.      . Cholecalciferol (VITAMIN D3) 1000 UNITS CAPS Take 1 capsule by mouth daily.      Marland Kitchen EASY COMFORT INSULIN SYRINGE 30G X  1/2" 0.5 ML MISC     . glipiZIDE (GLUCOTROL) 5 MG tablet Take 5 mg by mouth daily.     Marland Kitchen glucose blood test strip     . HYDROcodone-acetaminophen (NORCO) 5-325 MG per tablet Take 1 tablet by mouth 4 (four) times daily as needed. For pain     . Lancet Devices (LANCING DEVICE) MISC     . Lancets (ONETOUCH ULTRASOFT) lancets Use as directed    . lovastatin (MEVACOR) 20 MG tablet Take 20 mg by mouth at bedtime.      . meloxicam (MOBIC) 7.5 MG tablet Take 1 tablet (7.5 mg total) by mouth daily. 5 tablet 0  . metFORMIN (GLUCOPHAGE) 500 MG tablet Take 500 mg by mouth 2 (two) times daily with a meal.     . metoprolol (LOPRESSOR) 50 MG tablet Take 1 tablet by mouth 2 (two) times daily.     . Multiple Vitamin (MULTIVITAMIN) tablet Take 1 tablet by mouth daily.      Marland Kitchen omega-3 acid ethyl esters (LOVAZA) 1 G capsule Take 2 g by mouth 2 (two) times daily.    . ONE TOUCH  ULTRA TEST test strip Use as directed    . pantoprazole (PROTONIX) 40 MG tablet     . tiotropium (SPIRIVA) 18 MCG inhalation capsule Place 18 mcg into inhaler and inhale daily as needed.     . traMADol (ULTRAM) 50 MG tablet Take 1 tablet (50 mg total) by mouth every 6 (six) hours as needed. 15 tablet 0  . triamterene-hydrochlorothiazide (MAXZIDE-25) 37.5-25 MG per tablet Take 1 tablet by mouth daily.      Marland Kitchen warfarin (COUMADIN) 5 MG tablet Take 5 mg by mouth as directed. Take at 6pm on an empty stomach     No current facility-administered medications on file prior to visit.     REVIEW OF SYSTEMS: Cardiovascular: No chest pain, chest pressure, palpitations, orthopnea, or dyspnea on exertion. No claudication or rest pain,  No history of DVT or phlebitis. Positive for varicose veins Pulmonary: No productive cough, asthma or wheezing. Neurologic: No weakness, paresthesias, aphasia, or amaurosis. No dizziness. Hematologic: No bleeding problems or clotting disorders. Musculoskeletal: No joint pain or joint swelling. Gastrointestinal: No blood in stool or hematemesis Genitourinary: No dysuria or hematuria. Psychiatric:: No history of major depression. Integumentary: No rashes or ulcers. Constitutional: No fever or chills.  PHYSICAL EXAMINATION:   Vital signs are  Filed Vitals:   04/14/15 1219 04/14/15 1221  BP: 148/82 145/90  Pulse: 83   Temp: 97.5 F (36.4 C)   TempSrc: Oral   Height: 5' 5.5" (1.664 m)   Weight: 169 lb 8 oz (76.885 kg)   SpO2: 90%    Body mass index is 27.77 kg/(m^2). General: The patient appears their stated age. HEENT:  No gross abnormalities Pulmonary:  Non labored breathing Abdomen: Soft and non-tender Musculoskeletal: There are no major deformities. Neurologic: No focal weakness or paresthesias are detected, Skin: There are no ulcer or rashes noted. Psychiatric: The patient has normal affect. Cardiovascular: There is a regular rate and rhythm without  significant murmur appreciated.   Diagnostic Studies  I have reviewed her CT scan today which shows stable pulmonary nodules since 2012.  There is no evidence of malignancy.  Assessment:  #1: Abdominal aortic aneurysm  #2: Pulmonary nodule Plan:  #1: The patient should have follow-up in about 9 months with a follow-up ultrasound. #2: the lung nodules are benign and therefore no further follow-up was recommended.  V.  Leia Alf, M.D. [facility] [place] Office: [phone] Pager:  [phone]

## 2015-04-29 ENCOUNTER — Ambulatory Visit (INDEPENDENT_AMBULATORY_CARE_PROVIDER_SITE_OTHER): Payer: Medicare HMO | Admitting: General Practice

## 2015-04-29 DIAGNOSIS — Z5181 Encounter for therapeutic drug level monitoring: Secondary | ICD-10-CM | POA: Diagnosis not present

## 2015-04-29 LAB — POCT INR: INR: 3.5

## 2015-04-29 NOTE — Progress Notes (Signed)
Pre visit review using our clinic review tool, if applicable. No additional management support is needed unless otherwise documented below in the visit note. 

## 2015-04-29 NOTE — Progress Notes (Signed)
I have reviewed and agree with the plan. 

## 2015-05-06 DIAGNOSIS — J129 Viral pneumonia, unspecified: Secondary | ICD-10-CM | POA: Diagnosis not present

## 2015-05-06 DIAGNOSIS — J449 Chronic obstructive pulmonary disease, unspecified: Secondary | ICD-10-CM | POA: Diagnosis not present

## 2015-05-06 DIAGNOSIS — I2782 Chronic pulmonary embolism: Secondary | ICD-10-CM | POA: Diagnosis not present

## 2015-05-23 DIAGNOSIS — R69 Illness, unspecified: Secondary | ICD-10-CM | POA: Diagnosis not present

## 2015-05-24 DIAGNOSIS — R69 Illness, unspecified: Secondary | ICD-10-CM | POA: Diagnosis not present

## 2015-05-26 DIAGNOSIS — M545 Low back pain: Secondary | ICD-10-CM | POA: Diagnosis not present

## 2015-05-26 DIAGNOSIS — M1711 Unilateral primary osteoarthritis, right knee: Secondary | ICD-10-CM | POA: Diagnosis not present

## 2015-05-26 DIAGNOSIS — M5136 Other intervertebral disc degeneration, lumbar region: Secondary | ICD-10-CM | POA: Diagnosis not present

## 2015-05-27 ENCOUNTER — Ambulatory Visit (INDEPENDENT_AMBULATORY_CARE_PROVIDER_SITE_OTHER): Payer: Medicare HMO | Admitting: General Practice

## 2015-05-27 DIAGNOSIS — Z5181 Encounter for therapeutic drug level monitoring: Secondary | ICD-10-CM

## 2015-05-27 LAB — POCT INR: INR: 1.6

## 2015-05-27 NOTE — Progress Notes (Signed)
Pre visit review using our clinic review tool, if applicable. No additional management support is needed unless otherwise documented below in the visit note. 

## 2015-05-27 NOTE — Progress Notes (Signed)
I have reviewed and agree with the plan. 

## 2015-06-06 DIAGNOSIS — J449 Chronic obstructive pulmonary disease, unspecified: Secondary | ICD-10-CM | POA: Diagnosis not present

## 2015-06-06 DIAGNOSIS — J129 Viral pneumonia, unspecified: Secondary | ICD-10-CM | POA: Diagnosis not present

## 2015-06-06 DIAGNOSIS — I2782 Chronic pulmonary embolism: Secondary | ICD-10-CM | POA: Diagnosis not present

## 2015-06-10 ENCOUNTER — Ambulatory Visit (INDEPENDENT_AMBULATORY_CARE_PROVIDER_SITE_OTHER): Payer: Medicare HMO | Admitting: General Practice

## 2015-06-10 DIAGNOSIS — Z5181 Encounter for therapeutic drug level monitoring: Secondary | ICD-10-CM

## 2015-06-10 LAB — POCT INR: INR: 1.5

## 2015-06-10 NOTE — Progress Notes (Signed)
I have reviewed and agree with the plan. 

## 2015-06-10 NOTE — Progress Notes (Signed)
Pre visit review using our clinic review tool, if applicable. No additional management support is needed unless otherwise documented below in the visit note. 

## 2015-06-21 ENCOUNTER — Telehealth: Payer: Self-pay

## 2015-06-21 NOTE — Telephone Encounter (Signed)
Are you interested in this pt coming in fo rAWV

## 2015-06-24 NOTE — Telephone Encounter (Signed)
Call to the patient to discuss AWV; Is coming in for INR at 11am. Will plan to stay at 11;15 for AWV.

## 2015-07-01 ENCOUNTER — Ambulatory Visit (INDEPENDENT_AMBULATORY_CARE_PROVIDER_SITE_OTHER): Payer: Medicare HMO | Admitting: General Practice

## 2015-07-01 ENCOUNTER — Ambulatory Visit (INDEPENDENT_AMBULATORY_CARE_PROVIDER_SITE_OTHER): Payer: Medicare HMO

## 2015-07-01 ENCOUNTER — Telehealth: Payer: Self-pay | Admitting: Internal Medicine

## 2015-07-01 ENCOUNTER — Ambulatory Visit: Payer: Medicare HMO

## 2015-07-01 VITALS — BP 130/80 | Wt 169.0 lb

## 2015-07-01 DIAGNOSIS — Z0000 Encounter for general adult medical examination without abnormal findings: Secondary | ICD-10-CM

## 2015-07-01 DIAGNOSIS — Z5181 Encounter for therapeutic drug level monitoring: Secondary | ICD-10-CM

## 2015-07-01 LAB — POCT INR: INR: 1.9

## 2015-07-01 NOTE — Progress Notes (Signed)
Pre visit review using our clinic review tool, if applicable. No additional management support is needed unless otherwise documented below in the visit note. 

## 2015-07-01 NOTE — Telephone Encounter (Signed)
Called and spoke with pt Pt requesting samples of spiriva Informed pt that office does not currently have any samples of medication and to call back at a later time to see if we have any Pt voiced understanding of this   Nothing further is needed at this time.

## 2015-07-01 NOTE — Progress Notes (Signed)
I have reviewed and agree with the plan. 

## 2015-07-01 NOTE — Progress Notes (Signed)
Subjective:   Alejandra [name] is a 81 y.o. female who presents for Medicare Annual (Subsequent) preventive examination.  Review of Systems:  HRA assessment completed during visit; [place], [place] The Patient was informed that this wellness visit is to identify risk and educate on how to reduce risk for increase disease through lifestyle changes.   ROS deferred to CPE exam with physician for CPE/ recommend she see PCP q 6 months/ exam and labs due now and agreed to schedule with Terri Piedra prior to leaving today  Father hx of HTN, DM and MI Mother HD; HTN; MI;   Medical issues abdominal aneurysm s/p repair on 10/2011; HTN; PE; DM COPD; voices understanding of these dx; Request refill of Spiriva inhaler; Called and discussed with pulmonary but has not seen pulmonary doctor since 2013; so will check on samples; will call the patient with the fup. (Discussed refill and stated she could not afford the medicine; Encouraged her to make fup apt with Terri Piedra to discussed; declined refill; but will check on needy med assist.)  Dyslipidemia labs need to be redrawn with A1c and other labs;    BMI: Not weighed today but stated weight 169; would like to lose weight; BMI 27.8 Diet; Breakfast; chicken; lunch; if you feel like it;  Supper; "different things' / very non-specific regarding diet; (secondary to memory or hearing)  Exercise; walks slowly with back curvature but "get up and go" is excellent; states she stays in her  chair all the time; discussed exercise in the home but not interested at this time. States she did walk 3 miles and was told that was too much and now she walks around her block about one mile when she feels like it. States it is safe to walk around block.   Falls; no; uses a cane at times due to right knee hurting; secondary to OA; Educated to try and use on the left when right leg is leading; Will try to see if this helps.  SAFETY/ lives with grand -son; he is 58;  Has 2  children lives in town; Dtr checks on her every day;  Cooks once a week; every week cooks something different;   Safety reviewed; one  level home; discussed  eliminating clutter, bedroom near bathroom; does have a nightlight; TV light; recommended carrying cell phone to bathroom at hs; bathroom safety reviewed; community safety; smoke detectors and firearms safety reviewed;  Driving accidents no / drives to grocery store and church which are close by Sister in law calls her every day; sister and brother is living; brother in [place];  Sun protection; not much out Stressors; no stressors   Medication review/ New meds/ states she is complaint with current meds; Checks bs qd but stated it was "alright" this am; Did not give me numbers  Lower back; states she was sent a back brace by her insurance company;  Denies dizziness from sitting to standing.   Mobilization and Functional losses in the last year; no Sleep patterns/ not good; 4 hours; doesn't sleep well   Urinary or fecal incontinence reviewed/ problems with bowel or bladder;   Counseling: Colonoscopy; no record EKG: 10/2011 Hearing: '2000hz'$  both ears Dexa; had not have one Mammagram 2002; aged out Ophthalmology exam; 3 months ago; Doesn't need glasses; little cataract Immunizations DECLINED:  Needs tetanus; Zostavax; Prevnar 13; flu  Current Care Team reviewed and updated Vascular surgeon; Trula Slade, [facility] -   Cardiac Risk Factors include: advanced age (>62mn, >  79 women);diabetes mellitus;dyslipidemia;family history of premature cardiovascular disease;hypertension     Objective:     Vitals: BP 130/80 mmHg  Tobacco History  Smoking status  . Former Smoker -- 0.50 packs/day for 54 years  . Quit date: 01/23/2010  Smokeless tobacco  . Never Used     Counseling given: Not Answered   Past Medical History  Diagnosis Date  . Pulmonary embolism   . COPD (chronic obstructive pulmonary  disease)   . DM (diabetes mellitus)   . Dyslipidemia   . Hypertension   . Hyperlipidemia   . AAA (abdominal aortic aneurysm)   . Left foot infection 2013  . 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 Vesicovaginal fistula closure w/ tah    . Endovascular stent insertion  10/30/2011    Procedure: ENDOVASCULAR STENT GRAFT INSERTION;  Surgeon: Serafina Mitchell, MD;  Location: [facility] OR;  Service: Vascular;  Laterality: N/A;  . Abdominal aortic aneurysm repair  10-30-2011   Family History  Problem Relation Age of Onset  . Heart disease Mother   . Hypertension Mother   . Heart attack Mother   . Heart disease Sister     x 3  . Hypertension Sister   . Heart attack Sister   . Clotting disorder Daughter   . Clotting disorder Son     multiple sons  . Diabetes Son   . Hypertension Father   . Diabetes Father   . Heart attack Father   . Hypertension Brother    History  Sexual Activity  . Sexual Activity: Not on file    Outpatient Encounter Prescriptions as of 07/01/2015  Medication Sig  . albuterol (PROVENTIL) (2.5 MG/3ML) 0.083% nebulizer solution Take 2.5 mg by nebulization every 6 (six) hours as needed. For shortness of breath  . Alcohol Swabs (ALCOHOL PREP) 70 % PADS   . aspirin 81 MG tablet Take 81 mg by mouth daily.    . Cholecalciferol (VITAMIN D3) 1000 UNITS CAPS Take 1 capsule by mouth daily.    Marland Kitchen EASY COMFORT INSULIN SYRINGE 30G X 1/2" 0.5 ML MISC   . glipiZIDE (GLUCOTROL) 5 MG tablet Take 5 mg by mouth daily.   Marland Kitchen glucose blood test strip   . HYDROcodone-acetaminophen (NORCO) 5-325 MG per tablet Take 1 tablet by mouth 4 (four) times daily as needed. For pain   . Lancet Devices (LANCING DEVICE) MISC   . Lancets (ONETOUCH ULTRASOFT) lancets Use as directed  . lovastatin (MEVACOR) 20 MG tablet Take 20 mg by mouth at bedtime.    . meloxicam (MOBIC) 7.5 MG tablet Take 1 tablet (7.5 mg total) by mouth daily.  . metFORMIN (GLUCOPHAGE) 500 MG  tablet Take 500 mg by mouth 2 (two) times daily with a meal.   . metoprolol (LOPRESSOR) 50 MG tablet Take 1 tablet by mouth 2 (two) times daily.   . Multiple Vitamin (MULTIVITAMIN) tablet Take 1 tablet by mouth daily.    Marland Kitchen omega-3 acid ethyl esters (LOVAZA) 1 G capsule Take 2 g by mouth 2 (two) times daily.  . ONE TOUCH ULTRA TEST test strip Use as directed  . pantoprazole (PROTONIX) 40 MG tablet   . tiotropium (SPIRIVA) 18 MCG inhalation capsule Place 18 mcg into inhaler and inhale daily as needed.   . traMADol (ULTRAM) 50 MG tablet Take 1 tablet (50 mg total) by mouth every 6 (six) hours as needed.  . triamterene-hydrochlorothiazide (MAXZIDE-25) 37.5-25 MG per tablet Take 1 tablet  by mouth daily.    Marland Kitchen warfarin (COUMADIN) 5 MG tablet Take 5 mg by mouth as directed. Take at 6pm on an empty stomach   No facility-administered encounter medications on file as of 07/01/2015.    Activities of Daily Living In your present state of health, do you have any difficulty performing the following activities: 07/01/2015  Hearing? (No Data)  Vision? N  Walking or climbing stairs? Y  Dressing or bathing? (No Data)  Doing errands, shopping? N  Preparing Food and eating ? N  Using the Toilet? N  In the past six months, have you accidently leaked urine? N  Do you have problems with loss of bowel control? N  Managing your Medications? N  Managing your Finances? N  Housekeeping or managing your Housekeeping? N    Patient Care Team: Golden Circle, FNP as PCP - General (Family Medicine)    Assessment:    Assessment   Patient presents for yearly preventative medicine examination. Medicare questionnaire screening were completed, i.e. Functional; fall risk; depression and hearing; all unremarkable   MMSE; 22/30; very vague answers at times regarding diet; could not remember BS this am; Orientation good; math was not attempted but could spell WORLD; could not spell backwards; failed picture test.    No failures to note of daily living; Children check on her and she lives with 70 yo grand son;   Safety; Discussed keeping cell phone with her at hs when up to the bathroom;  Doesn't sleep well;  All immunizations and health maintenance protocols were reviewed with the patient and she declines all vaccines;   Education provided for laboratory screens;  Needs current labs and agrees to make apt with Dr. Elna Breslow.  Medication reconciliation, past medical history, social history, problem list and allergies were reviewed in detail with the patient  Goals were established with regard to continuing exercise;   Enof life planning was discussed and has been completed   Exercise Activities and Dietary recommendations Current Exercise Habits:: Home exercise routine, Type of exercise: walking (sometimes), Intensity: Mild  Goals    . patient      Walks around some; walk around the block now every day  Doesn't walk when it is cold       Fall Risk Fall Risk  07/01/2015  Falls in the past year? No   Depression Screen PHQ 2/9 Scores 07/01/2015 06/17/2011  PHQ - 2 Score 0 0      Cognitive Testing No flowsheet data found. See note above  There is no immunization history on file for this patient. Screening Tests Health Maintenance  Topic Date Due  . TETANUS/TDAP  02/04/1951  . ZOSTAVAX  02/04/1994  . DEXA SCAN  02/05/1999  . PNA vac Low Risk Adult (1 of 2 - PCV13) 02/05/1999  . INFLUENZA VACCINE  02/17/2015      Plan:   During the course of the visit the patient was educated and counseled about the following appropriate screening and preventive services:   Vaccines to include Pneumoccal, Influenza, Hepatitis B, Td, Zostavax, HCV  Declines all vaccines  Electrocardiogram 10/2011  Cardiovascular Disease/ need labs; no c/o of chest pain  Colorectal cancer screening/ no report found  Bone density screening/ declined  Diabetes screening/ states bs in the am is good and  checks every day  Glaucoma screening/ neg  Mammography/PAP/ aged out  Nutrition counseling / difficult to discuss because she did not verbalize today what she cooks but appears well nourished;  Patient Instructions (the written plan) was given to the patient.   Wynetta Fines, RN  07/01/2015     Medical screening examination/treatment/procedure(s) were performed by non-physician practitioner and as supervising provider I was immediately available for consultation/collaboration. I agree with above. Mauricio Po, FNP

## 2015-07-01 NOTE — Patient Instructions (Addendum)
[name] , Thank you for taking time to come for your Medicare Wellness Visit. I appreciate your ongoing commitment to your health goals. Please review the following plan we discussed and let me know if I can assist you in the future.   Apt with Terri Piedra scheduled  These are the goals we discussed: Goals    None      This is a list of the screening recommended for you and due dates:  Health Maintenance  Topic Date Due  . Tetanus Vaccine  02/04/1951  . Shingles Vaccine  02/04/1994  . DEXA scan (bone density measurement)  02/05/1999  . Pneumonia vaccines (1 of 2 - PCV13) 02/05/1999  . Flu Shot  02/17/2015     Fall Prevention in the Home  Falls can cause injuries. They can happen to people of all ages. There are many things you can do to make your home safe and to help prevent falls.  WHAT CAN I DO ON THE OUTSIDE OF MY HOME?  Regularly fix the edges of walkways and driveways and fix any cracks.  Remove anything that might make you trip as you walk through a door, such as a raised step or threshold.  Trim any bushes or trees on the path to your home.  Use bright outdoor lighting.  Clear any walking paths of anything that might make someone trip, such as rocks or tools.  Regularly check to see if handrails are loose or broken. Make sure that both sides of any steps have handrails.  Any raised decks and porches should have guardrails on the edges.  Have any leaves, snow, or ice cleared regularly.  Use sand or salt on walking paths during winter.  Clean up any spills in your garage right away. This includes oil or grease spills. WHAT CAN I DO IN THE BATHROOM?   Use night lights.  Install grab bars by the toilet and in the tub and shower. Do not use towel bars as grab bars.  Use non-skid mats or decals in the tub or shower.  If you need to sit down in the shower, use a plastic, non-slip stool.  Keep the floor dry. Clean up any water that spills on the floor as  soon as it happens.  Remove soap buildup in the tub or shower regularly.  Attach bath mats securely with double-sided non-slip rug tape.  Do not have throw rugs and other things on the floor that can make you trip. WHAT CAN I DO IN THE BEDROOM?  Use night lights.  Make sure that you have a light by your bed that is easy to reach.  Do not use any sheets or blankets that are too big for your bed. They should not hang down onto the floor.  Have a firm chair that has side arms. You can use this for support while you get dressed.  Do not have throw rugs and other things on the floor that can make you trip. WHAT CAN I DO IN THE KITCHEN?  Clean up any spills right away.  Avoid walking on wet floors.  Keep items that you use a lot in easy-to-reach places.  If you need to reach something above you, use a strong step stool that has a grab bar.  Keep electrical cords out of the way.  Do not use floor polish or wax that makes floors slippery. If you must use wax, use non-skid floor wax.  Do not have throw rugs and other things  on the floor that can make you trip. WHAT CAN I DO WITH MY STAIRS?  Do not leave any items on the stairs.  Make sure that there are handrails on both sides of the stairs and use them. Fix handrails that are broken or loose. Make sure that handrails are as long as the stairways.  Check any carpeting to make sure that it is firmly attached to the stairs. Fix any carpet that is loose or worn.  Avoid having throw rugs at the top or bottom of the stairs. If you do have throw rugs, attach them to the floor with carpet tape.  Make sure that you have a light switch at the top of the stairs and the bottom of the stairs. If you do not have them, ask someone to add them for you. WHAT ELSE CAN I DO TO HELP PREVENT FALLS?  Wear shoes that:  Do not have high heels.  Have rubber bottoms.  Are comfortable and fit you well.  Are closed at the toe. Do not wear  sandals.  If you use a stepladder:  Make sure that it is fully opened. Do not climb a closed stepladder.  Make sure that both sides of the stepladder are locked into place.  Ask someone to hold it for you, if possible.  Clearly mark and make sure that you can see:  Any grab bars or handrails.  First and last steps.  Where the edge of each step is.  Use tools that help you move around (mobility aids) if they are needed. These include:  Canes.  Walkers.  Scooters.  Crutches.  Turn on the lights when you go into a dark area. Replace any light bulbs as soon as they burn out.  Set up your furniture so you have a clear path. Avoid moving your furniture around.  If any of your floors are uneven, fix them.  If there are any pets around you, be aware of where they are.  Review your medicines with your doctor. Some medicines can make you feel dizzy. This can increase your chance of falling. Ask your doctor what other things that you can do to help prevent falls.   This information is not intended to replace advice given to you by your health care provider. Make sure you discuss any questions you have with your health care provider.   Document Released: 05/01/2009 Document Revised: 11/19/2014 Document Reviewed: 08/09/2014 Elsevier Interactive Patient Education 2016 [name], Female Adopting a healthy lifestyle and getting preventive care can go a long way to promote health and wellness. Talk with your health care provider about what schedule of regular examinations is right for you. This is a good chance for you to check in with your provider about disease prevention and staying healthy. In between checkups, there are plenty of things you can do on your own. Experts have done a lot of research about which lifestyle changes and preventive measures are most likely to keep you healthy. Ask your health care provider for more information. WEIGHT AND DIET  Eat  a healthy diet  Be sure to include plenty of vegetables, fruits, low-fat dairy products, and lean protein.  Do not eat a lot of foods high in solid fats, added sugars, or salt.  Get regular exercise. This is one of the most important things you can do for your health.  Most adults should exercise for at least 150 minutes each week. The exercise should increase your heart  rate and make you sweat (moderate-intensity exercise).  Most adults should also do strengthening exercises at least twice a week. This is in addition to the moderate-intensity exercise.  Maintain a healthy weight  Body mass index (BMI) is a measurement that can be used to identify possible weight problems. It estimates body fat based on height and weight. Your health care provider can help determine your BMI and help you achieve or maintain a healthy weight.  For females 79 years of age and older:   A BMI below 18.5 is considered underweight.  A BMI of 18.5 to 24.9 is normal.  A BMI of 25 to 29.9 is considered overweight.  A BMI of 30 and above is considered obese.  Watch levels of cholesterol and blood lipids  You should start having your blood tested for lipids and cholesterol at 79 years of age, then have this test every 5 years.  You may need to have your cholesterol levels checked more often if:  Your lipid or cholesterol levels are high.  You are older than 79 years of age.  You are at high risk for heart disease.  CANCER SCREENING   Lung Cancer  Lung cancer screening is recommended for adults 61-37 years old who are at high risk for lung cancer because of a history of smoking.  A yearly low-dose CT scan of the lungs is recommended for people who:  Currently smoke.  Have quit within the past 15 years.  Have at least a 30-pack-year history of smoking. A pack year is smoking an average of one pack of cigarettes a day for 1 year.  Yearly screening should continue until it has been 15 years  since you quit.  Yearly screening should stop if you develop a health problem that would prevent you from having lung cancer treatment.  Breast Cancer  Practice breast self-awareness. This means understanding how your breasts normally appear and feel.  It also means doing regular breast self-exams. Let your health care provider know about any changes, no matter how small.  If you are in your 20s or 30s, you should have a clinical breast exam (CBE) by a health care provider every 1-3 years as part of a regular health exam.  If you are 54 or older, have a CBE every year. Also consider having a breast X-ray (mammogram) every year.  If you have a family history of breast cancer, talk to your health care provider about genetic screening.  If you are at high risk for breast cancer, talk to your health care provider about having an MRI and a mammogram every year.  Breast cancer gene (BRCA) assessment is recommended for women who have family members with BRCA-related cancers. BRCA-related cancers include:  Breast.  Ovarian.  Tubal.  Peritoneal cancers.  Results of the assessment will determine the need for genetic counseling and BRCA1 and BRCA2 testing. Cervical Cancer Your health care provider may recommend that you be screened regularly for cancer of the pelvic organs (ovaries, uterus, and vagina). This screening involves a pelvic examination, including checking for microscopic changes to the surface of your cervix (Pap test). You may be encouraged to have this screening done every 3 years, beginning at age 74.  For women ages 67-65, health care providers may recommend pelvic exams and Pap testing every 3 years, or they may recommend the Pap and pelvic exam, combined with testing for human papilloma virus (HPV), every 5 years. Some types of HPV increase your risk of cervical cancer.  Testing for HPV may also be done on women of any age with unclear Pap test results.  Other health care  providers may not recommend any screening for nonpregnant women who are considered low risk for pelvic cancer and who do not have symptoms. Ask your health care provider if a screening pelvic exam is right for you.  If you have had past treatment for cervical cancer or a condition that could lead to cancer, you need Pap tests and screening for cancer for at least 20 years after your treatment. If Pap tests have been discontinued, your risk factors (such as having a new sexual partner) need to be reassessed to determine if screening should resume. Some women have medical problems that increase the chance of getting cervical cancer. In these cases, your health care provider may recommend more frequent screening and Pap tests. Colorectal Cancer  This type of cancer can be detected and often prevented.  Routine colorectal cancer screening usually begins at 79 years of age and continues through 79 years of age.  Your health care provider may recommend screening at an earlier age if you have risk factors for colon cancer.  Your health care provider may also recommend using home test kits to check for hidden blood in the stool.  A small camera at the end of a tube can be used to examine your colon directly (sigmoidoscopy or colonoscopy). This is done to check for the earliest forms of colorectal cancer.  Routine screening usually begins at age 60.  Direct examination of the colon should be repeated every 5-10 years through 79 years of age. However, you may need to be screened more often if early forms of precancerous polyps or small growths are found. Skin Cancer  Check your skin from head to toe regularly.  Tell your health care provider about any new moles or changes in moles, especially if there is a change in a mole's shape or color.  Also tell your health care provider if you have a mole that is larger than the size of a pencil eraser.  Always use sunscreen. Apply sunscreen liberally and  repeatedly throughout the day.  Protect yourself by wearing long sleeves, pants, a wide-brimmed hat, and sunglasses whenever you are outside. HEART DISEASE, DIABETES, AND HIGH BLOOD PRESSURE   High blood pressure causes heart disease and increases the risk of stroke. High blood pressure is more likely to develop in:  People who have blood pressure in the high end of the normal range (130-139/85-89 mm Hg).  People who are overweight or obese.  People who are African American.  If you are 34-53 years of age, have your blood pressure checked every 3-5 years. If you are 29 years of age or older, have your blood pressure checked every year. You should have your blood pressure measured twice--once when you are at a hospital or clinic, and once when you are not at a hospital or clinic. Record the average of the two measurements. To check your blood pressure when you are not at a hospital or clinic, you can use:  An automated blood pressure machine at a pharmacy.  A home blood pressure monitor.  If you are between 43 years and 95 years old, ask your health care provider if you should take aspirin to prevent strokes.  Have regular diabetes screenings. This involves taking a blood sample to check your fasting blood sugar level.  If you are at a normal weight and have a low risk for  diabetes, have this test once every three years after 79 years of age.  If you are overweight and have a high risk for diabetes, consider being tested at a younger age or more often. PREVENTING INFECTION  Hepatitis B  If you have a higher risk for hepatitis B, you should be screened for this virus. You are considered at high risk for hepatitis B if:  You were born in a country where hepatitis B is common. Ask your health care provider which countries are considered high risk.  Your parents were born in a high-risk country, and you have not been immunized against hepatitis B (hepatitis B vaccine).  You have HIV or  AIDS.  You use needles to inject street drugs.  You live with someone who has hepatitis B.  You have had sex with someone who has hepatitis B.  You get hemodialysis treatment.  You take certain medicines for conditions, including cancer, organ transplantation, and autoimmune conditions. Hepatitis C  Blood testing is recommended for:  Everyone born from [birth year] through 1963.  Anyone with known risk factors for hepatitis C. Sexually transmitted infections (STIs)  You should be screened for sexually transmitted infections (STIs) including gonorrhea and chlamydia if:  You are sexually active and are younger than 79 years of age.  You are older than 79 years of age and your health care provider tells you that you are at risk for this type of infection.  Your sexual activity has changed since you were last screened and you are at an increased risk for chlamydia or gonorrhea. Ask your health care provider if you are at risk.  If you do not have HIV, but are at risk, it may be recommended that you take a prescription medicine daily to prevent HIV infection. This is called pre-exposure prophylaxis (PrEP). You are considered at risk if:  You are sexually active and do not regularly use condoms or know the HIV status of your partner(s).  You take drugs by injection.  You are sexually active with a partner who has HIV. Talk with your health care provider about whether you are at high risk of being infected with HIV. If you choose to begin PrEP, you should first be tested for HIV. You should then be tested every 3 months for as long as you are taking PrEP.  PREGNANCY   If you are premenopausal and you may become pregnant, ask your health care provider about preconception counseling.  If you may become pregnant, take 400 to 800 micrograms (mcg) of folic acid every day.  If you want to prevent pregnancy, talk to your health care provider about birth control (contraception). OSTEOPOROSIS AND  MENOPAUSE   Osteoporosis is a disease in which the bones lose minerals and strength with aging. This can result in serious bone fractures. Your risk for osteoporosis can be identified using a bone density scan.  If you are 32 years of age or older, or if you are at risk for osteoporosis and fractures, ask your health care provider if you should be screened.  Ask your health care provider whether you should take a calcium or vitamin D supplement to lower your risk for osteoporosis.  Menopause may have certain physical symptoms and risks.  Hormone replacement therapy may reduce some of these symptoms and risks. Talk to your health care provider about whether hormone replacement therapy is right for you.  HOME CARE INSTRUCTIONS   Schedule regular health, dental, and eye exams.  Stay current with your  immunizations.   Do not use any tobacco products including cigarettes, chewing tobacco, or electronic cigarettes.  If you are pregnant, do not drink alcohol.  If you are breastfeeding, limit how much and how often you drink alcohol.  Limit alcohol intake to no more than 1 drink per day for nonpregnant women. One drink equals 12 ounces of beer, 5 ounces of wine, or 1 ounces of hard liquor.  Do not use street drugs.  Do not share needles.  Ask your health care provider for help if you need support or information about quitting drugs.  Tell your health care provider if you often feel depressed.  Tell your health care provider if you have ever been abused or do not feel safe at home.   This information is not intended to replace advice given to you by your health care provider. Make sure you discuss any questions you have with your health care provider.   Document Released: 01/18/2011 Document Revised: 07/26/2014 Document Reviewed: 06/06/2013 Elsevier Interactive Patient Education [employer].

## 2015-07-06 DIAGNOSIS — I2782 Chronic pulmonary embolism: Secondary | ICD-10-CM | POA: Diagnosis not present

## 2015-07-06 DIAGNOSIS — J129 Viral pneumonia, unspecified: Secondary | ICD-10-CM | POA: Diagnosis not present

## 2015-07-06 DIAGNOSIS — J449 Chronic obstructive pulmonary disease, unspecified: Secondary | ICD-10-CM | POA: Diagnosis not present

## 2015-07-07 ENCOUNTER — Ambulatory Visit (INDEPENDENT_AMBULATORY_CARE_PROVIDER_SITE_OTHER): Payer: Medicare HMO | Admitting: Family

## 2015-07-07 ENCOUNTER — Other Ambulatory Visit (INDEPENDENT_AMBULATORY_CARE_PROVIDER_SITE_OTHER): Payer: Medicare HMO

## 2015-07-07 ENCOUNTER — Encounter: Payer: Self-pay | Admitting: Family

## 2015-07-07 VITALS — BP 146/88 | HR 83 | Temp 98.0°F | Resp 16 | Ht 65.5 in | Wt 171.0 lb

## 2015-07-07 DIAGNOSIS — E785 Hyperlipidemia, unspecified: Secondary | ICD-10-CM | POA: Diagnosis not present

## 2015-07-07 DIAGNOSIS — Z0000 Encounter for general adult medical examination without abnormal findings: Secondary | ICD-10-CM

## 2015-07-07 DIAGNOSIS — E119 Type 2 diabetes mellitus without complications: Secondary | ICD-10-CM | POA: Diagnosis not present

## 2015-07-07 DIAGNOSIS — I10 Essential (primary) hypertension: Secondary | ICD-10-CM

## 2015-07-07 DIAGNOSIS — Z23 Encounter for immunization: Secondary | ICD-10-CM | POA: Diagnosis not present

## 2015-07-07 LAB — LIPID PANEL
CHOLESTEROL: 162 mg/dL (ref 0–200)
HDL: 56.7 mg/dL (ref >39.00)
LDL Cholesterol: 77 mg/dL (ref 0–99)
NonHDL: 105.17
TRIGLYCERIDES: 141 mg/dL (ref 0.0–149.0)
Total CHOL/HDL Ratio: 3
VLDL: 28.2 mg/dL (ref 0.0–40.0)

## 2015-07-07 LAB — COMPREHENSIVE METABOLIC PANEL
ALBUMIN: 4 g/dL (ref 3.5–5.2)
ALK PHOS: 34 U/L — AB (ref 39–117)
ALT: 13 U/L (ref 0–35)
AST: 18 U/L (ref 0–37)
BILIRUBIN TOTAL: 0.2 mg/dL (ref 0.2–1.2)
BUN: 20 mg/dL (ref 6–23)
CO2: 30 mEq/L (ref 19–32)
CREATININE: 1.33 mg/dL — AB (ref 0.40–1.20)
Calcium: 9.4 mg/dL (ref 8.4–10.5)
Chloride: 101 mEq/L (ref 96–112)
GFR: 49.19 mL/min — ABNORMAL LOW (ref >60.00)
GLUCOSE: 109 mg/dL — AB (ref 70–99)
Potassium: 4.2 mEq/L (ref 3.5–5.1)
SODIUM: 142 meq/L (ref 135–145)
TOTAL PROTEIN: 7.5 g/dL (ref 6.0–8.3)

## 2015-07-07 LAB — CBC
HCT: 39.8 % (ref 36.0–46.0)
Hemoglobin: 12.9 g/dL (ref 12.0–15.0)
MCHC: 32.4 g/dL (ref 30.0–36.0)
MCV: 83 fl (ref 78.0–100.0)
Platelets: 228 10*3/uL (ref 150.0–400.0)
RBC: 4.8 Mil/uL (ref 3.87–5.11)
RDW: 16 % — AB (ref 11.5–15.5)
WBC: 6.2 10*3/uL (ref 4.0–10.5)

## 2015-07-07 LAB — TSH: TSH: 2.21 u[IU]/mL (ref 0.35–4.50)

## 2015-07-07 LAB — HEMOGLOBIN A1C: HEMOGLOBIN A1C: 7.1 % — AB (ref 4.6–6.5)

## 2015-07-07 NOTE — Patient Instructions (Addendum)
Thank you for choosing [facility].  Summary/Instructions:  Please stop by the lab on the basement level of the building for your blood work. Your results will be released to MyChart (or called to you) after review, usually within 72 hours after test completion. If any changes need to be made, you will be notified at that same time.  If your symptoms worsen or fail to improve, please contact our office for further instruction, or in case of emergency go directly to the emergency room at the closest medical facility.   Health Maintenance, Female Adopting a healthy lifestyle and getting preventive care can go a long way to promote health and wellness. Talk with your health care provider about what schedule of regular examinations is right for you. This is a good chance for you to check in with your provider about disease prevention and staying healthy. In between checkups, there are plenty of things you can do on your own. Experts have done a lot of research about which lifestyle changes and preventive measures are most likely to keep you healthy. Ask your health care provider for more information. WEIGHT AND DIET  Eat a healthy diet  Be sure to include plenty of vegetables, fruits, low-fat dairy products, and lean protein.  Do not eat a lot of foods high in solid fats, added sugars, or salt.  Get regular exercise. This is one of the most important things you can do for your health.  Most adults should exercise for at least 150 minutes each week. The exercise should increase your heart rate and make you sweat (moderate-intensity exercise).  Most adults should also do strengthening exercises at least twice a week. This is in addition to the moderate-intensity exercise.  Maintain a healthy weight  Body mass index (BMI) is a measurement that can be used to identify possible weight problems. It estimates body fat based on height and weight. Your health care provider can help determine your  BMI and help you achieve or maintain a healthy weight.  For females 20 years of age and older:   A BMI below 18.5 is considered underweight.  A BMI of 18.5 to 24.9 is normal.  A BMI of 25 to 29.9 is considered overweight.  A BMI of 30 and above is considered obese.  Watch levels of cholesterol and blood lipids  You should start having your blood tested for lipids and cholesterol at 79 years of age, then have this test every 5 years.  You may need to have your cholesterol levels checked more often if:  Your lipid or cholesterol levels are high.  You are older than 79 years of age.  You are at high risk for heart disease.  CANCER SCREENING   Lung Cancer  Lung cancer screening is recommended for adults 55-80 years old who are at high risk for lung cancer because of a history of smoking.  A yearly low-dose CT scan of the lungs is recommended for people who:  Currently smoke.  Have quit within the past 15 years.  Have at least a 30-pack-year history of smoking. A pack year is smoking an average of one pack of cigarettes a day for 1 year.  Yearly screening should continue until it has been 15 years since you quit.  Yearly screening should stop if you develop a health problem that would prevent you from having lung cancer treatment.  Breast Cancer  Practice breast self-awareness. This means understanding how your breasts normally appear and feel.  It   also means doing regular breast self-exams. Let your health care provider know about any changes, no matter how small.  If you are in your 20s or 30s, you should have a clinical breast exam (CBE) by a health care provider every 1-3 years as part of a regular health exam.  If you are 40 or older, have a CBE every year. Also consider having a breast X-ray (mammogram) every year.  If you have a family history of breast cancer, talk to your health care provider about genetic screening.  If you are at high risk for breast  cancer, talk to your health care provider about having an MRI and a mammogram every year.  Breast cancer gene (BRCA) assessment is recommended for women who have family members with BRCA-related cancers. BRCA-related cancers include:  Breast.  Ovarian.  Tubal.  Peritoneal cancers.  Results of the assessment will determine the need for genetic counseling and BRCA1 and BRCA2 testing. Cervical Cancer Your health care provider may recommend that you be screened regularly for cancer of the pelvic organs (ovaries, uterus, and vagina). This screening involves a pelvic examination, including checking for microscopic changes to the surface of your cervix (Pap test). You may be encouraged to have this screening done every 3 years, beginning at age 21.  For women ages 30-65, health care providers may recommend pelvic exams and Pap testing every 3 years, or they may recommend the Pap and pelvic exam, combined with testing for human papilloma virus (HPV), every 5 years. Some types of HPV increase your risk of cervical cancer. Testing for HPV may also be done on women of any age with unclear Pap test results.  Other health care providers may not recommend any screening for nonpregnant women who are considered low risk for pelvic cancer and who do not have symptoms. Ask your health care provider if a screening pelvic exam is right for you.  If you have had past treatment for cervical cancer or a condition that could lead to cancer, you need Pap tests and screening for cancer for at least 20 years after your treatment. If Pap tests have been discontinued, your risk factors (such as having a new sexual partner) need to be reassessed to determine if screening should resume. Some women have medical problems that increase the chance of getting cervical cancer. In these cases, your health care provider may recommend more frequent screening and Pap tests. Colorectal Cancer  This type of cancer can be detected and  often prevented.  Routine colorectal cancer screening usually begins at 79 years of age and continues through 79 years of age.  Your health care provider may recommend screening at an earlier age if you have risk factors for colon cancer.  Your health care provider may also recommend using home test kits to check for hidden blood in the stool.  A small camera at the end of a tube can be used to examine your colon directly (sigmoidoscopy or colonoscopy). This is done to check for the earliest forms of colorectal cancer.  Routine screening usually begins at age 50.  Direct examination of the colon should be repeated every 5-10 years through 79 years of age. However, you may need to be screened more often if early forms of precancerous polyps or small growths are found. Skin Cancer  Check your skin from head to toe regularly.  Tell your health care provider about any new moles or changes in moles, especially if there is a change in a mole's   mole's shape or color.  Also tell your health care provider if you have a mole that is larger than the size of a pencil eraser.  Always use sunscreen. Apply sunscreen liberally and repeatedly throughout the day.  Protect yourself by wearing long sleeves, pants, a wide-brimmed hat, and sunglasses whenever you are outside. HEART DISEASE, DIABETES, AND HIGH BLOOD PRESSURE   High blood pressure causes heart disease and increases the risk of stroke. High blood pressure is more likely to develop in:  People who have blood pressure in the high end of the normal range (130-139/85-89 mm Hg).  People who are overweight or obese.  People who are African American.  If you are 17-44 years of age, have your blood pressure checked every 3-5 years. If you are 96 years of age or older, have your blood pressure checked every year. You should have your blood pressure measured twice--once when you are at a hospital or clinic, and once when you are not at a hospital or clinic.  Record the average of the two measurements. To check your blood pressure when you are not at a hospital or clinic, you can use:  An automated blood pressure machine at a pharmacy.  A home blood pressure monitor.  If you are between 21 years and 45 years old, ask your health care provider if you should take aspirin to prevent strokes.  Have regular diabetes screenings. This involves taking a blood sample to check your fasting blood sugar level.  If you are at a normal weight and have a low risk for diabetes, have this test once every three years after 80 years of age.  If you are overweight and have a high risk for diabetes, consider being tested at a younger age or more often. PREVENTING INFECTION  Hepatitis B  If you have a higher risk for hepatitis B, you should be screened for this virus. You are considered at high risk for hepatitis B if:  You were born in a country where hepatitis B is common. Ask your health care provider which countries are considered high risk.  Your parents were born in a high-risk country, and you have not been immunized against hepatitis B (hepatitis B vaccine).  You have HIV or AIDS.  You use needles to inject street drugs.  You live with someone who has hepatitis B.  You have had sex with someone who has hepatitis B.  You get hemodialysis treatment.  You take certain medicines for conditions, including cancer, organ transplantation, and autoimmune conditions. Hepatitis C  Blood testing is recommended for:  Everyone born from [birth year] through 1963.  Anyone with known risk factors for hepatitis C. Sexually transmitted infections (STIs)  You should be screened for sexually transmitted infections (STIs) including gonorrhea and chlamydia if:  You are sexually active and are younger than 79 years of age.  You are older than 79 years of age and your health care provider tells you that you are at risk for this type of infection.  Your sexual activity  has changed since you were last screened and you are at an increased risk for chlamydia or gonorrhea. Ask your health care provider if you are at risk.  If you do not have HIV, but are at risk, it may be recommended that you take a prescription medicine daily to prevent HIV infection. This is called pre-exposure prophylaxis (PrEP). You are considered at risk if:  You are sexually active and do not regularly use condoms or know  the HIV status of your partner(s).  You take drugs by injection.  You are sexually active with a partner who has HIV. Talk with your health care provider about whether you are at high risk of being infected with HIV. If you choose to begin PrEP, you should first be tested for HIV. You should then be tested every 3 months for as long as you are taking PrEP.  PREGNANCY   If you are premenopausal and you may become pregnant, ask your health care provider about preconception counseling.  If you may become pregnant, take 400 to 800 micrograms (mcg) of folic acid every day.  If you want to prevent pregnancy, talk to your health care provider about birth control (contraception). OSTEOPOROSIS AND MENOPAUSE   Osteoporosis is a disease in which the bones lose minerals and strength with aging. This can result in serious bone fractures. Your risk for osteoporosis can be identified using a bone density scan.  If you are 58 years of age or older, or if you are at risk for osteoporosis and fractures, ask your health care provider if you should be screened.  Ask your health care provider whether you should take a calcium or vitamin D supplement to lower your risk for osteoporosis.  Menopause may have certain physical symptoms and risks.  Hormone replacement therapy may reduce some of these symptoms and risks. Talk to your health care provider about whether hormone replacement therapy is right for you.  HOME CARE INSTRUCTIONS   Schedule regular health, dental, and eye  exams.  Stay current with your immunizations.   Do not use any tobacco products including cigarettes, chewing tobacco, or electronic cigarettes.  If you are pregnant, do not drink alcohol.  If you are breastfeeding, limit how much and how often you drink alcohol.  Limit alcohol intake to no more than 1 drink per day for nonpregnant women. One drink equals 12 ounces of beer, 5 ounces of wine, or 1 ounces of hard liquor.  Do not use street drugs.  Do not share needles.  Ask your health care provider for help if you need support or information about quitting drugs.  Tell your health care provider if you often feel depressed.  Tell your health care provider if you have ever been abused or do not feel safe at home.   This information is not intended to replace advice given to you by your health care provider. Make sure you discuss any questions you have with your health care provider.   Document Released: 01/18/2011 Document Revised: 07/26/2014 Document Reviewed: 06/06/2013 Elsevier Interactive Patient Education [employer].

## 2015-07-07 NOTE — Progress Notes (Signed)
Subjective:    Patient ID: Alejandra [name], female    DOB: 01/02/1932, 81 y.o.   MRN: [id number]  Chief Complaint  Patient presents with  . Follow-up    HPI:  Alejandra [name] is a 81 y.o. female who presents today for an annual wellness visit.   1) Health Maintenance -   Diet - Averages about 2-3 meals per day; Variety of foods;   Exercise - Once in a while; walking.    2) Preventative Exams / Immunizations:  Dental -- Up to date  Vision -- Up to date   Health Maintenance  Topic Date Due  . TETANUS/TDAP  02/04/1953  . ZOSTAVAX  02/04/1994  . DEXA SCAN  02/05/1999  . PNA vac Low Risk Adult (1 of 2 - PCV13) 02/05/1999  . INFLUENZA VACCINE  02/17/2015  Prevnar / Tetanus   Immunization History  Administered Date(s) Administered  . Pneumococcal Conjugate-13 07/07/2015    Allergies  Allergen Reactions  . Penicillins Nausea And Vomiting, Rash and Other (See Comments)    Childhood reaction     Outpatient Prescriptions Prior to Visit  Medication Sig Dispense Refill  . albuterol (PROVENTIL) (2.5 MG/3ML) 0.083% nebulizer solution Take 2.5 mg by nebulization every 6 (six) hours as needed. For shortness of breath    . Alcohol Swabs (ALCOHOL PREP) 70 % PADS     . aspirin 81 MG tablet Take 81 mg by mouth daily.      . Cholecalciferol (VITAMIN D3) 1000 UNITS CAPS Take 1 capsule by mouth daily.      Marland Kitchen EASY COMFORT INSULIN SYRINGE 30G X 1/2" 0.5 ML MISC     . glipiZIDE (GLUCOTROL) 5 MG tablet Take 5 mg by mouth daily.     Marland Kitchen glucose blood test strip     . HYDROcodone-acetaminophen (NORCO) 5-325 MG per tablet Take 1 tablet by mouth 4 (four) times daily as needed. For pain     . Lancet Devices (LANCING DEVICE) MISC     . Lancets (ONETOUCH ULTRASOFT) lancets Use as directed    . lovastatin (MEVACOR) 20 MG tablet Take 20 mg by mouth at bedtime.      . meloxicam (MOBIC) 7.5 MG tablet Take 1 tablet (7.5 mg total) by mouth daily. 5 tablet 0  . metFORMIN (GLUCOPHAGE) 500 MG  tablet Take 500 mg by mouth 2 (two) times daily with a meal.     . metoprolol (LOPRESSOR) 50 MG tablet Take 1 tablet by mouth 2 (two) times daily.     . Multiple Vitamin (MULTIVITAMIN) tablet Take 1 tablet by mouth daily.      Marland Kitchen omega-3 acid ethyl esters (LOVAZA) 1 G capsule Take 2 g by mouth 2 (two) times daily.    . ONE TOUCH ULTRA TEST test strip Use as directed    . pantoprazole (PROTONIX) 40 MG tablet     . tiotropium (SPIRIVA) 18 MCG inhalation capsule Place 18 mcg into inhaler and inhale daily as needed.     . traMADol (ULTRAM) 50 MG tablet Take 1 tablet (50 mg total) by mouth every 6 (six) hours as needed. 15 tablet 0  . triamterene-hydrochlorothiazide (MAXZIDE-25) 37.5-25 MG per tablet Take 1 tablet by mouth daily.      Marland Kitchen warfarin (COUMADIN) 5 MG tablet Take 5 mg by mouth as directed. Take at 6pm on an empty stomach     No facility-administered medications prior to visit.     Past Medical History  Diagnosis Date  .  Pulmonary embolism ([place])   . COPD (chronic obstructive pulmonary disease) ([place])   . DM (diabetes mellitus) ([place])   . Dyslipidemia   . Hypertension   . Hyperlipidemia   . AAA (abdominal aortic aneurysm) ([place])   . Left foot infection 2013  . 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 2013     Past Surgical History  Procedure Laterality Date  . Vesicovaginal fistula closure w/ tah    . Endovascular stent insertion  10/30/2011    Procedure: ENDOVASCULAR STENT GRAFT INSERTION;  Surgeon: Serafina Mitchell, MD;  Location: [facility] OR;  Service: Vascular;  Laterality: N/A;  . Abdominal aortic aneurysm repair  10-30-2011     Family History  Problem Relation Age of Onset  . Heart disease Mother   . Hypertension Mother   . Heart attack Mother   . Heart disease Sister     x 3  . Hypertension Sister   . Heart attack Sister   . Clotting disorder Daughter   . Clotting disorder Son     multiple sons  . Diabetes Son   . Hypertension Father   . Diabetes Father   . Heart attack Father   .  Hypertension Brother      Social History   Social History  . Marital Status: Widowed    Spouse Name: N/A  . Number of Children: 2  . Years of Education: 12   Occupational History  . retired     Building surveyor   Social History Main Topics  . Smoking status: Former Smoker -- 0.50 packs/day for 61 years    Quit date: 01/23/2010  . Smokeless tobacco: Never Used  . Alcohol Use: No  . Drug Use: No  . Sexual Activity: Not on file   Other Topics Concern  . Not on file   Social History Narrative   Fun: Goes to church, Watch TV   Denies religious beliefs effecting health care.       Review of Systems  Constitutional: Denies fever, chills, fatigue, or significant weight gain/loss. HENT: Head: Denies headache or neck pain Ears: Denies changes in hearing, ringing in ears, earache, drainage Nose: Denies discharge, stuffiness, itching, nosebleed, sinus pain Throat: Denies sore throat, hoarseness, dry mouth, sores, thrush Eyes: Denies loss/changes in vision, pain, redness, blurry/double vision, flashing lights Cardiovascular: Denies chest pain/discomfort, tightness, palpitations, shortness of breath with activity, difficulty lying down, swelling, sudden awakening with shortness of breath Respiratory: Denies shortness of breath, cough, sputum production, wheezing Gastrointestinal: Denies dysphasia, heartburn, change in appetite, nausea, change in bowel habits, rectal bleeding, constipation, diarrhea, yellow skin or eyes Genitourinary: Denies frequency, urgency, burning/pain, blood in urine, incontinence, change in urinary strength. Musculoskeletal: Denies muscle/joint pain, stiffness, back pain, redness or swelling of joints, trauma Skin: Denies rashes, lumps, itching, dryness, color changes, or hair/nail changes Neurological: Denies dizziness, fainting, seizures, weakness, numbness, tingling, tremor Psychiatric - Denies nervousness, stress, depression or memory loss Endocrine:  Denies heat or cold intolerance, sweating, frequent urination, excessive thirst, changes in appetite Hematologic: Denies ease of bruising or bleeding     Objective:     BP 146/88 mmHg  Pulse 83  Temp(Src) 98 F (36.7 C)  Resp 16  Ht 5' 5.5" (1.664 m)  Wt 171 lb (77.565 kg)  BMI 28.01 kg/m2  SpO2 94% Nursing note and vital signs reviewed.   Depression screen PHQ 2/9 07/01/2015  Decreased Interest 0  Down, Depressed, Hopeless 0  PHQ - 2 Score 0    Physical Exam  Constitutional: She  is oriented to person, place, and time. She appears well-developed and well-nourished.  HENT:  Head: Normocephalic.  Right Ear: Hearing, tympanic membrane, external ear and ear canal normal.  Left Ear: Hearing, tympanic membrane, external ear and ear canal normal.  Nose: Nose normal.  Mouth/Throat: Uvula is midline, oropharynx is clear and moist and mucous membranes are normal.  Eyes: Conjunctivae and EOM are normal. Pupils are equal, round, and reactive to light.  Neck: Neck supple. No JVD present. No tracheal deviation present. No thyromegaly present.  Cardiovascular: Normal rate, regular rhythm, normal heart sounds and intact distal pulses.   Pulmonary/Chest: Effort normal and breath sounds normal.  Abdominal: Soft. Bowel sounds are normal. She exhibits no distension and no mass. There is no tenderness. There is no rebound and no guarding.  Musculoskeletal: Normal range of motion. She exhibits no edema or tenderness.  Lymphadenopathy:    She has no cervical adenopathy.  Neurological: She is alert and oriented to person, place, and time. She has normal reflexes. No cranial nerve deficit. She exhibits normal muscle tone. Coordination normal.  Skin: Skin is warm and dry.  Psychiatric: She has a normal mood and affect. Her behavior is normal. Judgment and thought content normal.       Assessment & Plan:   Problem List Items Addressed This Visit      Other   Routine general medical  examination at a health care facility - Primary    1) Anticipatory Guidance: Discussed importance of wearing a seatbelt while driving and not texting while driving; changing batteries in smoke detector at least once annually; wearing suntan lotion when outside; eating a balanced and moderate diet; getting physical activity at least 30 minutes per day.  2) Immunizations / Screenings / Labs:  Prevnar updated today. Due for Pneumovax and 6-12 months. Due for tetanus which was unavailable for today's office visit. Declines Zostavax and influenza. All other immunizations are up-to-date per recommendations. Declines DEXA. Obtain A1c for diabetes screen. All other screenings are up-to-date per recommendations. Obtain CBC, CMET, Lipid profile and TSH.   Overall well exam with risk factors for cardiovascular disease including hypertension, diabetes, dyslipidemia, overweight, and sedentary lifestyle. Discussed importance of eating a nutritional intake that is varied and moderate that focuses on nutrient dense foods and is low in saturated/transfer fats. Recommend increasing physical activity to 30 minutes of moderate level activity as tolerated. Hypertension slightly elevated today continue current dosage of medications and adjust a follow-up appointment as needed. Diabetes will be checked with A1c. Continue current dosage of medication pending results. Hyperlipidemia is currently managed with lovastatin. Current status which check lipid profile. Continue other healthy lifestyle behaviors and choices. Follow-up prevention exam in 1 year. Follow-up office visit pending blood work results.       Relevant Orders   CBC (Completed)   Comprehensive metabolic panel (Completed)   Lipid panel (Completed)   TSH (Completed)   Hemoglobin A1c (Completed)    Other Visit Diagnoses    Need for vaccination with 13-polyvalent pneumococcal conjugate vaccine        Relevant Orders    Pneumococcal conjugate vaccine 13-valent  IM (Completed)

## 2015-07-07 NOTE — Assessment & Plan Note (Signed)
1) Anticipatory Guidance: Discussed importance of wearing a seatbelt while driving and not texting while driving; changing batteries in smoke detector at least once annually; wearing suntan lotion when outside; eating a balanced and moderate diet; getting physical activity at least 30 minutes per day.  2) Immunizations / Screenings / Labs:  Prevnar updated today. Due for Pneumovax and 6-12 months. Due for tetanus which was unavailable for today's office visit. Declines Zostavax and influenza. All other immunizations are up-to-date per recommendations. Declines DEXA. Obtain A1c for diabetes screen. All other screenings are up-to-date per recommendations. Obtain CBC, CMET, Lipid profile and TSH.   Overall well exam with risk factors for cardiovascular disease including hypertension, diabetes, dyslipidemia, overweight, and sedentary lifestyle. Discussed importance of eating a nutritional intake that is varied and moderate that focuses on nutrient dense foods and is low in saturated/transfer fats. Recommend increasing physical activity to 30 minutes of moderate level activity as tolerated. Hypertension slightly elevated today continue current dosage of medications and adjust a follow-up appointment as needed. Diabetes will be checked with A1c. Continue current dosage of medication pending results. Hyperlipidemia is currently managed with lovastatin. Current status which check lipid profile. Continue other healthy lifestyle behaviors and choices. Follow-up prevention exam in 1 year. Follow-up office visit pending blood work results.

## 2015-07-07 NOTE — Progress Notes (Signed)
Pre visit review using our clinic review tool, if applicable. No additional management support is needed unless otherwise documented below in the visit note. 

## 2015-07-08 ENCOUNTER — Telehealth: Payer: Self-pay

## 2015-07-08 ENCOUNTER — Telehealth: Payer: Self-pay | Admitting: Family

## 2015-07-08 DIAGNOSIS — N183 Chronic kidney disease, stage 3 unspecified: Secondary | ICD-10-CM | POA: Insufficient documentation

## 2015-07-08 DIAGNOSIS — E1122 Type 2 diabetes mellitus with diabetic chronic kidney disease: Secondary | ICD-10-CM

## 2015-07-08 NOTE — Telephone Encounter (Signed)
Please inform the patient that her A1c is 7.1 which is good and to continue to take the metformin and glipizide. Otherwise her cholesterol, thyroid function, electrolytes and liver function are within the normal limits. Her kidney function remains stable. Follow up in 6 months.

## 2015-07-08 NOTE — Telephone Encounter (Signed)
Called Alejandra [name] regarding Spiriva handihaler; Outreach made to [employer] for assistance. Rec'd application; Guideline for assistance is 200% of the poverty level; 1980.00 or can add 693.00 add'l if supporting other.  Will send out application and educated the patient that she can apply even if she does not meet the financial limit, but to state what her medical expenses (and other)  are and that she cannot afford 300.00 + dollars for this medication;  Sometimes will approve under hardship conditions; [name] requested the application be sent to her home. Address confirmed  [place]

## 2015-07-10 NOTE — Telephone Encounter (Signed)
Pt aware of results. Sent in the mail.

## 2015-07-23 DIAGNOSIS — G8929 Other chronic pain: Secondary | ICD-10-CM | POA: Diagnosis not present

## 2015-07-23 DIAGNOSIS — E119 Type 2 diabetes mellitus without complications: Secondary | ICD-10-CM | POA: Diagnosis not present

## 2015-07-23 DIAGNOSIS — M545 Low back pain: Secondary | ICD-10-CM | POA: Diagnosis not present

## 2015-07-24 ENCOUNTER — Telehealth: Payer: Self-pay

## 2015-07-24 NOTE — Telephone Encounter (Signed)
Ms. Penafiel called and stated her Anna Genre was to expensive; She has the form I sent for assistance but did not know how to fill it out and children could not help. Will come into have INR checked on 1/10 and will wait to see me around 11;45 and will call the pharmaceutical company together. States she does not have Medicaid,

## 2015-07-29 ENCOUNTER — Ambulatory Visit (INDEPENDENT_AMBULATORY_CARE_PROVIDER_SITE_OTHER): Payer: Medicare HMO | Admitting: General Practice

## 2015-07-29 DIAGNOSIS — Z5181 Encounter for therapeutic drug level monitoring: Secondary | ICD-10-CM | POA: Diagnosis not present

## 2015-07-29 LAB — POCT INR: INR: 1.5

## 2015-07-29 NOTE — Progress Notes (Signed)
Pre visit review using our clinic review tool, if applicable. No additional management support is needed unless otherwise documented below in the visit note.  INR low today. RN discussed the importance of taking coumadin as prescribed.  Renterated that INR can drop up to one point when just one dose is missed.  Boosted patient for two days and adjusted dosage with an increase of 10 percent.   Discussed the importance of being consistent with dark green leafy vegetables. Also discussed the risks of INR being to low.  Patient verbalized understanding.

## 2015-07-29 NOTE — Progress Notes (Signed)
I have reviewed and agree with the plan. 

## 2015-07-29 NOTE — Telephone Encounter (Signed)
Alejandra [name] stopped by for review of assistance needed for Spriva; ordered but was 200.00 and she can not afford it. Call to [employer] at [phone] and spoke to Rep Amy, who spoke with Alejandra [name]. Alejandra [name] states she makes less that 200% poverty level and amy will send application to her home. Alejandra [name] will complete; bring in to the office when she has her next INR drawn on 1/25; Apt with Alejandra [name] at 11:15   Alejandra [name] to bring proof of SS income  and application on 3/33 and will fax this with a written rx to [employer] patient Assistance program at [phone]

## 2015-07-30 DIAGNOSIS — R69 Illness, unspecified: Secondary | ICD-10-CM | POA: Diagnosis not present

## 2015-07-31 DIAGNOSIS — R69 Illness, unspecified: Secondary | ICD-10-CM | POA: Diagnosis not present

## 2015-08-06 DIAGNOSIS — J449 Chronic obstructive pulmonary disease, unspecified: Secondary | ICD-10-CM | POA: Diagnosis not present

## 2015-08-06 DIAGNOSIS — J129 Viral pneumonia, unspecified: Secondary | ICD-10-CM | POA: Diagnosis not present

## 2015-08-06 DIAGNOSIS — I2782 Chronic pulmonary embolism: Secondary | ICD-10-CM | POA: Diagnosis not present

## 2015-08-13 ENCOUNTER — Telehealth: Payer: Self-pay

## 2015-08-13 ENCOUNTER — Ambulatory Visit (INDEPENDENT_AMBULATORY_CARE_PROVIDER_SITE_OTHER): Payer: Medicare HMO | Admitting: General Practice

## 2015-08-13 DIAGNOSIS — Z5181 Encounter for therapeutic drug level monitoring: Secondary | ICD-10-CM

## 2015-08-13 LAB — POCT INR: INR: 1.7

## 2015-08-13 NOTE — Telephone Encounter (Signed)
Alejandra [name] in today for INR and assist with Rx application from [employer] for spiriva  Reviewed form and completed and will forward to Terri Piedra for signature;   Alejandra [name] also stated she had noted some "bumps" on private area yesterday; spoke to NP on site and suggested she use some otc vagisil and if this does not help to call can get a Same day apt tomorrow to be seen;

## 2015-08-13 NOTE — Progress Notes (Signed)
Pre visit review using our clinic review tool, if applicable. No additional management support is needed unless otherwise documented below in the visit note. 

## 2015-08-14 NOTE — Telephone Encounter (Signed)
Recommend follow up with GYN. If she does not have one a referral will be placed.

## 2015-08-15 NOTE — Telephone Encounter (Signed)
Call to Alejandra [name]; Rx application for spriva faxed to [employer];  Also asked if "bumps" on perineum area was better; States it does not burn but blisters are still there. Agrees to go to a GYN;   Please advise if she needs referral. Stated she would like to go to [facility] or other; she has [employer]

## 2015-08-18 ENCOUNTER — Telehealth: Payer: Self-pay

## 2015-08-18 NOTE — Telephone Encounter (Signed)
Error

## 2015-08-18 NOTE — Telephone Encounter (Signed)
Call to Ms. Walker who was c/o of perineal irritation; "bumps" and itching; NP on 25th recommended vagisil and to call back if not better;  The patient was called today and states her perineal area is much better;  Asked if she wanted GYN referral and she does; States she has not seen a GYN in 40 years but would like a referral "close by".  Please refer to GYN for exam and fup.

## 2015-08-21 ENCOUNTER — Telehealth: Payer: Self-pay | Admitting: *Deleted

## 2015-08-21 DIAGNOSIS — M546 Pain in thoracic spine: Secondary | ICD-10-CM | POA: Diagnosis not present

## 2015-08-21 MED ORDER — TIOTROPIUM BROMIDE MONOHYDRATE 18 MCG IN CAPS: 18.0000 ug | ORAL_CAPSULE | Freq: Every day | RESPIRATORY_TRACT | Status: DC | PRN

## 2015-08-21 NOTE — Telephone Encounter (Signed)
Received call rep stated they have received the patient assistant form for Spiriva, but there was not a prescription attach. Would like a rx fax to [phone].Marland KitchenPrinted script fax to company.Marland KitchenMarland KitchenLMB

## 2015-09-06 DIAGNOSIS — I2782 Chronic pulmonary embolism: Secondary | ICD-10-CM | POA: Diagnosis not present

## 2015-09-06 DIAGNOSIS — J129 Viral pneumonia, unspecified: Secondary | ICD-10-CM | POA: Diagnosis not present

## 2015-09-06 DIAGNOSIS — J449 Chronic obstructive pulmonary disease, unspecified: Secondary | ICD-10-CM | POA: Diagnosis not present

## 2015-09-10 ENCOUNTER — Ambulatory Visit (INDEPENDENT_AMBULATORY_CARE_PROVIDER_SITE_OTHER): Payer: Medicare HMO | Admitting: General Practice

## 2015-09-10 DIAGNOSIS — Z5181 Encounter for therapeutic drug level monitoring: Secondary | ICD-10-CM

## 2015-09-10 LAB — POCT INR: INR: 3.5

## 2015-09-10 NOTE — Progress Notes (Signed)
I have reviewed and agree with the plan. 

## 2015-09-10 NOTE — Progress Notes (Signed)
Pre visit review using our clinic review tool, if applicable. No additional management support is needed unless otherwise documented below in the visit note. 

## 2015-09-18 ENCOUNTER — Telehealth: Payer: Self-pay | Admitting: *Deleted

## 2015-09-18 MED ORDER — TIOTROPIUM BROMIDE MONOHYDRATE 18 MCG IN CAPS: 18.0000 ug | ORAL_CAPSULE | Freq: Every day | RESPIRATORY_TRACT | Status: DC

## 2015-09-18 NOTE — Telephone Encounter (Signed)
Left msg on triage stating trying to finish up pt assistant forms on pt, but needing rx Spiriva fax over to [phone]. Printed rx fax to # given....Johny Chess

## 2015-09-26 ENCOUNTER — Telehealth: Payer: Self-pay

## 2015-09-26 NOTE — Telephone Encounter (Signed)
Call to [employer] Patient Assist at [phone] , ext [phone] and then 6; Spoke to [place] who stated this patient medication was being processed which will take 3 to 5 days and then it will be shipped to her home. Called Ms Pall to let her know and she should receive a call letting her know when the medication has been shipped.

## 2015-10-04 DIAGNOSIS — J129 Viral pneumonia, unspecified: Secondary | ICD-10-CM | POA: Diagnosis not present

## 2015-10-04 DIAGNOSIS — J449 Chronic obstructive pulmonary disease, unspecified: Secondary | ICD-10-CM | POA: Diagnosis not present

## 2015-10-04 DIAGNOSIS — I2782 Chronic pulmonary embolism: Secondary | ICD-10-CM | POA: Diagnosis not present

## 2015-10-08 ENCOUNTER — Ambulatory Visit (INDEPENDENT_AMBULATORY_CARE_PROVIDER_SITE_OTHER): Payer: Medicare HMO | Admitting: General Practice

## 2015-10-08 DIAGNOSIS — Z5181 Encounter for therapeutic drug level monitoring: Secondary | ICD-10-CM

## 2015-10-08 LAB — POCT INR: INR: 3

## 2015-10-08 NOTE — Progress Notes (Signed)
Pre visit review using our clinic review tool, if applicable. No additional management support is needed unless otherwise documented below in the visit note. 

## 2015-10-08 NOTE — Progress Notes (Signed)
I have reviewed and agree with the plan. 

## 2015-11-04 DIAGNOSIS — J129 Viral pneumonia, unspecified: Secondary | ICD-10-CM | POA: Diagnosis not present

## 2015-11-04 DIAGNOSIS — I2782 Chronic pulmonary embolism: Secondary | ICD-10-CM | POA: Diagnosis not present

## 2015-11-04 DIAGNOSIS — J449 Chronic obstructive pulmonary disease, unspecified: Secondary | ICD-10-CM | POA: Diagnosis not present

## 2015-11-05 ENCOUNTER — Ambulatory Visit (INDEPENDENT_AMBULATORY_CARE_PROVIDER_SITE_OTHER): Payer: Medicare HMO | Admitting: General Practice

## 2015-11-05 DIAGNOSIS — Z5181 Encounter for therapeutic drug level monitoring: Secondary | ICD-10-CM | POA: Diagnosis not present

## 2015-11-05 LAB — POCT INR: INR: 3.4

## 2015-11-05 NOTE — Progress Notes (Signed)
Pre visit review using our clinic review tool, if applicable. No additional management support is needed unless otherwise documented below in the visit note. 

## 2015-11-05 NOTE — Progress Notes (Signed)
I have reviewed and agree with the plan. 

## 2015-12-02 DIAGNOSIS — M19012 Primary osteoarthritis, left shoulder: Secondary | ICD-10-CM | POA: Diagnosis not present

## 2015-12-02 DIAGNOSIS — M1711 Unilateral primary osteoarthritis, right knee: Secondary | ICD-10-CM | POA: Diagnosis not present

## 2015-12-02 DIAGNOSIS — M5137 Other intervertebral disc degeneration, lumbosacral region: Secondary | ICD-10-CM | POA: Diagnosis not present

## 2015-12-04 DIAGNOSIS — I2782 Chronic pulmonary embolism: Secondary | ICD-10-CM | POA: Diagnosis not present

## 2015-12-04 DIAGNOSIS — J449 Chronic obstructive pulmonary disease, unspecified: Secondary | ICD-10-CM | POA: Diagnosis not present

## 2015-12-04 DIAGNOSIS — J129 Viral pneumonia, unspecified: Secondary | ICD-10-CM | POA: Diagnosis not present

## 2015-12-10 ENCOUNTER — Ambulatory Visit (INDEPENDENT_AMBULATORY_CARE_PROVIDER_SITE_OTHER): Payer: Medicare HMO | Admitting: General Practice

## 2015-12-10 DIAGNOSIS — Z5181 Encounter for therapeutic drug level monitoring: Secondary | ICD-10-CM | POA: Diagnosis not present

## 2015-12-10 LAB — POCT INR: INR: 4.9

## 2015-12-10 NOTE — Progress Notes (Signed)
I have reviewed and agree with the plan. 

## 2015-12-10 NOTE — Progress Notes (Signed)
Pre visit review using our clinic review tool, if applicable. No additional management support is needed unless otherwise documented below in the visit note. 

## 2015-12-11 ENCOUNTER — Ambulatory Visit (INDEPENDENT_AMBULATORY_CARE_PROVIDER_SITE_OTHER)
Admission: RE | Admit: 2015-12-11 | Discharge: 2015-12-11 | Disposition: A | Discharge status: Home / Self Care | Payer: Medicare HMO | Source: Ambulatory Visit | Attending: Family | Admitting: Family

## 2015-12-11 ENCOUNTER — Other Ambulatory Visit: Payer: Self-pay | Admitting: Family

## 2015-12-11 ENCOUNTER — Encounter: Payer: Self-pay | Admitting: Family

## 2015-12-11 ENCOUNTER — Ambulatory Visit (INDEPENDENT_AMBULATORY_CARE_PROVIDER_SITE_OTHER): Payer: Medicare HMO | Admitting: Family

## 2015-12-11 VITALS — BP 126/80 | HR 94 | Temp 98.3°F | Resp 16 | Ht 65.5 in | Wt 168.8 lb

## 2015-12-11 DIAGNOSIS — M50321 Other cervical disc degeneration at C4-C5 level: Secondary | ICD-10-CM | POA: Diagnosis not present

## 2015-12-11 DIAGNOSIS — M542 Cervicalgia: Secondary | ICD-10-CM

## 2015-12-11 MED ORDER — TIZANIDINE HCL 2 MG PO TABS: 2.0000 mg | ORAL_TABLET | Freq: Four times a day (QID) | ORAL | Status: DC | PRN

## 2015-12-11 NOTE — Patient Instructions (Signed)
Thank you for choosing [facility].  Summary/Instructions:  Please continue to take her medications as prescribed.  Alternate ice/heat 2-3 times per day and as needed.  Start the Zanaflex as needed for muscle spasms. Be cautious a can make you sleepy or drowsy. Do not operate heavy machinery.  Exercises daily.  Your prescription(s) have been submitted to your pharmacy or been printed and provided for you. Please take as directed and contact our office if you believe you are having problem(s) with the medication(s) or have any questions.  Please stop by radiology on the basement level of the building for your x-rays. Your results will be released to [place] (or called to you) after review, usually within 72 hours after test completion. If any treatments or changes are necessary, you will be notified at that same time.  If your symptoms worsen or fail to improve, please contact our office for further instruction, or in case of emergency go directly to the emergency room at the closest medical facility.    Cervical Strain and Sprain With Rehab Cervical strain and sprain are injuries that commonly occur with "whiplash" injuries. Whiplash occurs when the neck is forcefully whipped backward or forward, such as during a motor vehicle accident or during contact sports. The muscles, ligaments, tendons, discs, and nerves of the neck are susceptible to injury when this occurs. RISK FACTORS Risk of having a whiplash injury increases if:  Osteoarthritis of the spine.  Situations that make head or neck accidents or trauma more likely.  High-risk sports (football, rugby, wrestling, hockey, auto racing, gymnastics, diving, contact karate, or boxing).  Poor strength and flexibility of the neck.  Previous neck injury.  Poor tackling technique.  Improperly fitted or padded equipment. SYMPTOMS   Pain or stiffness in the front or back of neck or both.  Symptoms may present immediately or  up to 24 hours after injury.  Dizziness, headache, nausea, and vomiting.  Muscle spasm with soreness and stiffness in the neck.  Tenderness and swelling at the injury site. PREVENTION  Learn and use proper technique (avoid tackling with the head, spearing, and head-butting; use proper falling techniques to avoid landing on the head).  Warm up and stretch properly before activity.  Maintain physical fitness:  Strength, flexibility, and endurance.  Cardiovascular fitness.  Wear properly fitted and padded protective equipment, such as padded soft collars, for participation in contact sports. PROGNOSIS  Recovery from cervical strain and sprain injuries is dependent on the extent of the injury. These injuries are usually curable in 1 week to 3 months with appropriate treatment.  RELATED COMPLICATIONS   Temporary numbness and weakness may occur if the nerve roots are damaged, and this may persist until the nerve has completely healed.  Chronic pain due to frequent recurrence of symptoms.  Prolonged healing, especially if activity is resumed too soon (before complete recovery). TREATMENT  Treatment initially involves the use of ice and medication to help reduce pain and inflammation. It is also important to perform strengthening and stretching exercises and modify activities that worsen symptoms so the injury does not get worse. These exercises may be performed at home or with a therapist. For patients who experience severe symptoms, a soft, padded collar may be recommended to be worn around the neck.  Improving your posture may help reduce symptoms. Posture improvement includes pulling your chin and abdomen in while sitting or standing. If you are sitting, sit in a firm chair with your buttocks against the back of the  chair. While sleeping, try replacing your pillow with a small towel rolled to 2 inches in diameter, or use a cervical pillow or soft cervical collar. Poor sleeping positions  delay healing.  For patients with nerve root damage, which causes numbness or weakness, the use of a cervical traction apparatus may be recommended. Surgery is rarely necessary for these injuries. However, cervical strain and sprains that are present at birth (congenital) may require surgery. MEDICATION   If pain medication is necessary, nonsteroidal anti-inflammatory medications, such as aspirin and ibuprofen, or other minor pain relievers, such as acetaminophen, are often recommended.  Do not take pain medication for 7 days before surgery.  Prescription pain relievers may be given if deemed necessary by your caregiver. Use only as directed and only as much as you need. HEAT AND COLD:   Cold treatment (icing) relieves pain and reduces inflammation. Cold treatment should be applied for 10 to 15 minutes every 2 to 3 hours for inflammation and pain and immediately after any activity that aggravates your symptoms. Use ice packs or an ice massage.  Heat treatment may be used prior to performing the stretching and strengthening activities prescribed by your caregiver, physical therapist, or athletic trainer. Use a heat pack or a warm soak. SEEK MEDICAL CARE IF:   Symptoms get worse or do not improve in 2 weeks despite treatment.  New, unexplained symptoms develop (drugs used in treatment may produce side effects). EXERCISES RANGE OF MOTION (ROM) AND STRETCHING EXERCISES - Cervical Strain and Sprain These exercises may help you when beginning to rehabilitate your injury. In order to successfully resolve your symptoms, you must improve your posture. These exercises are designed to help reduce the forward-head and rounded-shoulder posture which contributes to this condition. Your symptoms may resolve with or without further involvement from your physician, physical therapist or athletic trainer. While completing these exercises, remember:   Restoring tissue flexibility helps normal motion to return to  the joints. This allows healthier, less painful movement and activity.  An effective stretch should be held for at least 20 seconds, although you may need to begin with shorter hold times for comfort.  A stretch should never be painful. You should only feel a gentle lengthening or release in the stretched tissue. STRETCH- Axial Extensors  Lie on your back on the floor. You may bend your knees for comfort. Place a rolled-up hand towel or dish towel, about 2 inches in diameter, under the part of your head that makes contact with the floor.  Gently tuck your chin, as if trying to make a "double chin," until you feel a gentle stretch at the base of your head.  Hold __________ seconds. Repeat __________ times. Complete this exercise __________ times per day.  STRETCH - Axial Extension   Stand or sit on a firm surface. Assume a good posture: chest up, shoulders drawn back, abdominal muscles slightly tense, knees unlocked (if standing) and feet hip width apart.  Slowly retract your chin so your head slides back and your chin slightly lowers. Continue to look straight ahead.  You should feel a gentle stretch in the back of your head. Be certain not to feel an aggressive stretch since this can cause headaches later.  Hold for __________ seconds. Repeat __________ times. Complete this exercise __________ times per day. STRETCH - Cervical Side Bend   Stand or sit on a firm surface. Assume a good posture: chest up, shoulders drawn back, abdominal muscles slightly tense, knees unlocked (if standing) and  feet hip width apart.  Without letting your nose or shoulders move, slowly tip your right / left ear to your shoulder until your feel a gentle stretch in the muscles on the opposite side of your neck.  Hold __________ seconds. Repeat __________ times. Complete this exercise __________ times per day. STRETCH - Cervical Rotators   Stand or sit on a firm surface. Assume a good posture: chest up,  shoulders drawn back, abdominal muscles slightly tense, knees unlocked (if standing) and feet hip width apart.  Keeping your eyes level with the ground, slowly turn your head until you feel a gentle stretch along the back and opposite side of your neck.  Hold __________ seconds. Repeat __________ times. Complete this exercise __________ times per day. RANGE OF MOTION - Neck Circles   Stand or sit on a firm surface. Assume a good posture: chest up, shoulders drawn back, abdominal muscles slightly tense, knees unlocked (if standing) and feet hip width apart.  Gently roll your head down and around from the back of one shoulder to the back of the other. The motion should never be forced or painful.  Repeat the motion 10-20 times, or until you feel the neck muscles relax and loosen. Repeat __________ times. Complete the exercise __________ times per day. STRENGTHENING EXERCISES - Cervical Strain and Sprain These exercises may help you when beginning to rehabilitate your injury. They may resolve your symptoms with or without further involvement from your physician, physical therapist, or athletic trainer. While completing these exercises, remember:   Muscles can gain both the endurance and the strength needed for everyday activities through controlled exercises.  Complete these exercises as instructed by your physician, physical therapist, or athletic trainer. Progress the resistance and repetitions only as guided.  You may experience muscle soreness or fatigue, but the pain or discomfort you are trying to eliminate should never worsen during these exercises. If this pain does worsen, stop and make certain you are following the directions exactly. If the pain is still present after adjustments, discontinue the exercise until you can discuss the trouble with your clinician. STRENGTH - Cervical Flexors, Isometric  Face a wall, standing about 6 inches away. Place a small pillow, a ball about 6-8  inches in diameter, or a folded towel between your forehead and the wall.  Slightly tuck your chin and gently push your forehead into the soft object. Push only with mild to moderate intensity, building up tension gradually. Keep your jaw and forehead relaxed.  Hold 10 to 20 seconds. Keep your breathing relaxed.  Release the tension slowly. Relax your neck muscles completely before you start the next repetition. Repeat __________ times. Complete this exercise __________ times per day. STRENGTH- Cervical Lateral Flexors, Isometric   Stand about 6 inches away from a wall. Place a small pillow, a ball about 6-8 inches in diameter, or a folded towel between the side of your head and the wall.  Slightly tuck your chin and gently tilt your head into the soft object. Push only with mild to moderate intensity, building up tension gradually. Keep your jaw and forehead relaxed.  Hold 10 to 20 seconds. Keep your breathing relaxed.  Release the tension slowly. Relax your neck muscles completely before you start the next repetition. Repeat __________ times. Complete this exercise __________ times per day. STRENGTH - Cervical Extensors, Isometric   Stand about 6 inches away from a wall. Place a small pillow, a ball about 6-8 inches in diameter, or a folded towel between  the back of your head and the wall.  Slightly tuck your chin and gently tilt your head back into the soft object. Push only with mild to moderate intensity, building up tension gradually. Keep your jaw and forehead relaxed.  Hold 10 to 20 seconds. Keep your breathing relaxed.  Release the tension slowly. Relax your neck muscles completely before you start the next repetition. Repeat __________ times. Complete this exercise __________ times per day. POSTURE AND BODY MECHANICS CONSIDERATIONS - Cervical Strain and Sprain Keeping correct posture when sitting, standing or completing your activities will reduce the stress put on different  body tissues, allowing injured tissues a chance to heal and limiting painful experiences. The following are general guidelines for improved posture. Your physician or physical therapist will provide you with any instructions specific to your needs. While reading these guidelines, remember:  The exercises prescribed by your provider will help you have the flexibility and strength to maintain correct postures.  The correct posture provides the optimal environment for your joints to work. All of your joints have less wear and tear when properly supported by a spine with good posture. This means you will experience a healthier, less painful body.  Correct posture must be practiced with all of your activities, especially prolonged sitting and standing. Correct posture is as important when doing repetitive low-stress activities (typing) as it is when doing a single heavy-load activity (lifting). PROLONGED STANDING WHILE SLIGHTLY LEANING FORWARD When completing a task that requires you to lean forward while standing in one place for a long time, place either foot up on a stationary 2- to 4-inch high object to help maintain the best posture. When both feet are on the ground, the low back tends to lose its slight inward curve. If this curve flattens (or becomes too large), then the back and your other joints will experience too much stress, fatigue more quickly, and can cause pain.  RESTING POSITIONS Consider which positions are most painful for you when choosing a resting position. If you have pain with flexion-based activities (sitting, bending, stooping, squatting), choose a position that allows you to rest in a less flexed posture. You would want to avoid curling into a fetal position on your side. If your pain worsens with extension-based activities (prolonged standing, working overhead), avoid resting in an extended position such as sleeping on your stomach. Most people will find more comfort when they rest  with their spine in a more neutral position, neither too rounded nor too arched. Lying on a non-sagging bed on your side with a pillow between your knees, or on your back with a pillow under your knees will often provide some relief. Keep in mind, being in any one position for a prolonged period of time, no matter how correct your posture, can still lead to stiffness. WALKING Walk with an upright posture. Your ears, shoulders, and hips should all line up. OFFICE WORK When working at a desk, create an environment that supports good, upright posture. Without extra support, muscles fatigue and lead to excessive strain on joints and other tissues. CHAIR:  A chair should be able to slide under your desk when your back makes contact with the back of the chair. This allows you to work closely.  The chair's height should allow your eyes to be level with the upper part of your monitor and your hands to be slightly lower than your elbows.  Body position:  Your feet should make contact with the floor. If this is  not possible, use a foot rest.  Keep your ears over your shoulders. This will reduce stress on your neck and low back.   This information is not intended to replace advice given to you by your health care provider. Make sure you discuss any questions you have with your health care provider.   Document Released: 1November 13, 202006 Document Revised: 07/26/2014 Document Reviewed: 10/17/2008 Elsevier Interactive Patient Education [employer].

## 2015-12-11 NOTE — Progress Notes (Signed)
Pre visit review using our clinic review tool, if applicable. No additional management support is needed unless otherwise documented below in the visit note. 

## 2015-12-11 NOTE — Assessment & Plan Note (Signed)
Neck pain with no identifiable trauma and mild tenderness noted around C7. Range of motion slightly restricted most likely due to muscle spasm. Obtain x-rays to rule out underlying pathology. Start tizanidine. Discussed risks, appropriate medication usage, and side effects. Patient verbally understands usage of medication. Continue conservative treatment with ice/heat and stretching. If symptoms worsen or do not improve consider formal physical therapy.

## 2015-12-11 NOTE — Progress Notes (Signed)
Subjective:    Patient ID: Alejandra [name], female    DOB: 01/28/1932, 82 y.o.   MRN: [id number]  Chief Complaint  Patient presents with  . Neck Pain    having neck pain x3 weeks, hurts to hold head up    HPI:  Alejandra [name] is a 82 y.o. female who  has a past medical history of Pulmonary embolism ([place]); COPD (chronic obstructive pulmonary disease) ([place]); DM (diabetes mellitus) ([place]); Dyslipidemia; Hypertension; Hyperlipidemia; AAA (abdominal aortic aneurysm) ([place]); Left foot infection (2013); and O2 dependent (Nov. 2013). and presents today for an acute office visit.   This is a new problem. Associated symptom of pain located in her neck has been going on for about 3 weeks. Describes that it hurts to hold her head up. Pain is described as sharp on occasion. Denies any trauma. No numbness or tingling in either upper extremity. Modifying factors include rubbing alcohol which does help make it feel better. States she has tried a little bit of stretching. Course of the symptoms has remained about the same.   Allergies  Allergen Reactions  . Penicillins Nausea And Vomiting, Rash and Other (See Comments)    Childhood reaction     Current Outpatient Prescriptions on File Prior to Visit  Medication Sig Dispense Refill  . albuterol (PROVENTIL) (2.5 MG/3ML) 0.083% nebulizer solution Take 2.5 mg by nebulization every 6 (six) hours as needed. For shortness of breath    . Alcohol Swabs (ALCOHOL PREP) 70 % PADS     . aspirin 81 MG tablet Take 81 mg by mouth daily.      . Cholecalciferol (VITAMIN D3) 1000 UNITS CAPS Take 1 capsule by mouth daily.      Marland Kitchen EASY COMFORT INSULIN SYRINGE 30G X 1/2" 0.5 ML MISC     . glipiZIDE (GLUCOTROL) 5 MG tablet Take 5 mg by mouth daily.     Marland Kitchen glucose blood test strip     . HYDROcodone-acetaminophen (NORCO) 5-325 MG per tablet Take 1 tablet by mouth 4 (four) times daily as needed. For pain     . Lancet Devices (LANCING DEVICE) MISC     . Lancets (ONETOUCH  ULTRASOFT) lancets Use as directed    . lovastatin (MEVACOR) 20 MG tablet Take 20 mg by mouth at bedtime.      . metFORMIN (GLUCOPHAGE) 500 MG tablet Take 500 mg by mouth 2 (two) times daily with a meal.     . metoprolol (LOPRESSOR) 50 MG tablet Take 1 tablet by mouth 2 (two) times daily.     . Multiple Vitamin (MULTIVITAMIN) tablet Take 1 tablet by mouth daily.      Marland Kitchen omega-3 acid ethyl esters (LOVAZA) 1 G capsule Take 2 g by mouth 2 (two) times daily.    . ONE TOUCH ULTRA TEST test strip Use as directed    . pantoprazole (PROTONIX) 40 MG tablet     . tiotropium (SPIRIVA) 18 MCG inhalation capsule Place 1 capsule (18 mcg total) into inhaler and inhale daily. 90 capsule 3  . traMADol (ULTRAM) 50 MG tablet Take 1 tablet (50 mg total) by mouth every 6 (six) hours as needed. 15 tablet 0  . triamterene-hydrochlorothiazide (MAXZIDE-25) 37.5-25 MG per tablet Take 1 tablet by mouth daily.      Marland Kitchen warfarin (COUMADIN) 5 MG tablet Take 5 mg by mouth as directed. Take at 6pm on an empty stomach     No current facility-administered medications on file prior to visit.  Past Surgical History  Procedure Laterality Date  . Vesicovaginal fistula closure w/ tah    . Endovascular stent insertion  10/30/2011    Procedure: ENDOVASCULAR STENT GRAFT INSERTION;  Surgeon: Serafina Mitchell, MD;  Location: [facility];  Service: Vascular;  Laterality: N/A;  . Abdominal aortic aneurysm repair  10-30-2011    Review of Systems  Constitutional: Negative for fever and chills.  Musculoskeletal: Positive for neck pain.  Neurological: Negative for weakness and numbness.      Objective:    BP 126/80 mmHg  Pulse 94  Temp(Src) 98.3 F (36.8 C) (Oral)  Resp 16  Ht 5' 5.5" (1.664 m)  Wt 168 lb 12.8 oz (76.567 kg)  BMI 27.65 kg/m2  SpO2 96% Nursing note and vital signs reviewed.  Physical Exam  Constitutional: She is oriented to person, place, and time. She appears well-developed and well-nourished. No distress.    Neck:  No obvious discoloration or edema. Increased prominence of C7 and thoracic curvature. Mild tenderness along paraspinal musculature. Range of motion is decreased in lateral bending. Able to perform all motions with some discomfort noted with extension.  Cardiovascular: Normal rate, regular rhythm, normal heart sounds and intact distal pulses.   Pulmonary/Chest: Effort normal and breath sounds normal.  Neurological: She is alert and oriented to person, place, and time.  Skin: Skin is warm and dry.  Psychiatric: She has a normal mood and affect. Her behavior is normal. Judgment and thought content normal.       Assessment & Plan:   Problem List Items Addressed This Visit      Other   Neck pain - Primary    Neck pain with no identifiable trauma and mild tenderness noted around C7. Range of motion slightly restricted most likely due to muscle spasm. Obtain x-rays to rule out underlying pathology. Start tizanidine. Discussed risks, appropriate medication usage, and side effects. Patient verbally understands usage of medication. Continue conservative treatment with ice/heat and stretching. If symptoms worsen or do not improve consider formal physical therapy.      Relevant Medications   tiZANidine (ZANAFLEX) 2 MG tablet   Other Relevant Orders   DG Cervical Spine Complete       I have discontinued Ms. Rodriguez's meloxicam. I am also having her start on tiZANidine. Additionally, I am having her maintain her warfarin, glipiZIDE, lovastatin, triamterene-hydrochlorothiazide, metFORMIN, multivitamin, Vitamin D3, metoprolol, aspirin, HYDROcodone-acetaminophen, albuterol, ONE TOUCH ULTRA TEST, onetouch ultrasoft, omega-3 acid ethyl esters, Alcohol Prep, pantoprazole, glucose blood, Lancing Device, EASY COMFORT INSULIN SYRINGE, traMADol, and tiotropium.   Meds ordered this encounter  Medications  . tiZANidine (ZANAFLEX) 2 MG tablet    Sig: Take 1-2 tablets (2-4 mg total) by mouth every 6  (six) hours as needed for muscle spasms.    Dispense:  30 tablet    Refill:  0    Order Specific Question:  Supervising Provider    Answer:  Pricilla Holm A [[id number]]     Follow-up: Return in about 1 month (around 01/11/2016), or if symptoms worsen or fail to improve.   Mauricio Po, FNP

## 2015-12-12 ENCOUNTER — Telehealth: Payer: Self-pay | Admitting: Family

## 2015-12-12 NOTE — Telephone Encounter (Signed)
Pt aware of results 

## 2015-12-12 NOTE — Telephone Encounter (Signed)
Please inform patient that her x-rays show no evidence of fracture and there is degenerative joint disease with some narrowing of her spinal column. Please continue with the conservative treatment and if not improved we'll send her to physical therapy and possible back specialist if necessary.

## 2015-12-31 ENCOUNTER — Ambulatory Visit (INDEPENDENT_AMBULATORY_CARE_PROVIDER_SITE_OTHER): Payer: Medicare HMO | Admitting: General Practice

## 2015-12-31 DIAGNOSIS — Z5181 Encounter for therapeutic drug level monitoring: Secondary | ICD-10-CM

## 2015-12-31 LAB — POCT INR: INR: 2.3

## 2015-12-31 NOTE — Progress Notes (Signed)
Pre visit review using our clinic review tool, if applicable. No additional management support is needed unless otherwise documented below in the visit note. 

## 2015-12-31 NOTE — Progress Notes (Signed)
I have reviewed and agree with the plan. 

## 2016-01-02 DIAGNOSIS — M7502 Adhesive capsulitis of left shoulder: Secondary | ICD-10-CM | POA: Diagnosis not present

## 2016-01-02 DIAGNOSIS — M1711 Unilateral primary osteoarthritis, right knee: Secondary | ICD-10-CM | POA: Diagnosis not present

## 2016-01-02 DIAGNOSIS — M5136 Other intervertebral disc degeneration, lumbar region: Secondary | ICD-10-CM | POA: Diagnosis not present

## 2016-01-04 DIAGNOSIS — I2782 Chronic pulmonary embolism: Secondary | ICD-10-CM | POA: Diagnosis not present

## 2016-01-04 DIAGNOSIS — J449 Chronic obstructive pulmonary disease, unspecified: Secondary | ICD-10-CM | POA: Diagnosis not present

## 2016-01-04 DIAGNOSIS — J129 Viral pneumonia, unspecified: Secondary | ICD-10-CM | POA: Diagnosis not present

## 2016-01-05 ENCOUNTER — Encounter: Payer: Self-pay | Admitting: Family

## 2016-01-05 ENCOUNTER — Encounter: Payer: Self-pay | Admitting: Surgery

## 2016-01-12 ENCOUNTER — Ambulatory Visit (HOSPITAL_COMMUNITY)
Admission: RE | Admit: 2016-01-12 | Discharge: 2016-01-12 | Disposition: A | Discharge status: Home / Self Care | Payer: Medicare HMO | Source: Ambulatory Visit | Attending: Surgery | Admitting: Surgery

## 2016-01-12 ENCOUNTER — Ambulatory Visit (INDEPENDENT_AMBULATORY_CARE_PROVIDER_SITE_OTHER): Payer: Medicare HMO | Admitting: Family

## 2016-01-12 ENCOUNTER — Encounter: Payer: Self-pay | Admitting: Family

## 2016-01-12 VITALS — BP 138/72 | HR 100 | Temp 97.6°F | Resp 24 | Ht 65.0 in | Wt 169.0 lb

## 2016-01-12 DIAGNOSIS — J449 Chronic obstructive pulmonary disease, unspecified: Secondary | ICD-10-CM | POA: Insufficient documentation

## 2016-01-12 DIAGNOSIS — E785 Hyperlipidemia, unspecified: Secondary | ICD-10-CM | POA: Diagnosis not present

## 2016-01-12 DIAGNOSIS — Z9889 Other specified postprocedural states: Secondary | ICD-10-CM | POA: Diagnosis not present

## 2016-01-12 DIAGNOSIS — I714 Abdominal aortic aneurysm, without rupture, unspecified: Secondary | ICD-10-CM

## 2016-01-12 DIAGNOSIS — E119 Type 2 diabetes mellitus without complications: Secondary | ICD-10-CM | POA: Insufficient documentation

## 2016-01-12 DIAGNOSIS — I10 Essential (primary) hypertension: Secondary | ICD-10-CM | POA: Insufficient documentation

## 2016-01-12 DIAGNOSIS — Z9981 Dependence on supplemental oxygen: Secondary | ICD-10-CM | POA: Diagnosis not present

## 2016-01-12 DIAGNOSIS — Z4889 Encounter for other specified surgical aftercare: Secondary | ICD-10-CM | POA: Diagnosis not present

## 2016-01-12 DIAGNOSIS — Z95828 Presence of other vascular implants and grafts: Secondary | ICD-10-CM | POA: Diagnosis not present

## 2016-01-12 DIAGNOSIS — Z48812 Encounter for surgical aftercare following surgery on the circulatory system: Secondary | ICD-10-CM

## 2016-01-12 NOTE — Progress Notes (Signed)
[facility] [place]  CC: Follow up s/p EVAR  History of Present Illness  Alejandra [name] is a 82 y.o. ([birth year]) female patient of Dr. Trula Slade who is s/p endovascular aneurysm repair on 10/30/1998 for AAA. There was confusion placed by ultrasound that the aneurysm may have increased in size. Therefore Dr. Trula Slade sent her for a CT scan which revealed that the aneurysm sac had decreased down to 5.7 cm with no evidence of endoleak. Again seen was indeterminant pulmonary opacity in the right lung base for which CT scan follow-up was recommended. She is back today for follow-up of her lung nodule. She reports no interval changes. She denies chest pain or abdominal pain.  Dr. Trula Slade last saw pt on 04/14/15. At that time he reviewed her CT scan which showed stable pulmonary nodules since 2012. There was no evidence of malignancy. Dr. Trula Slade advised that the patient should have follow-up in about 9 months with a follow-up ultrasound. Dr. Trula Slade indicated that the lung nodules are benign and therefore no further follow-up was recommended.  The pt returns today for follow up s/p EVAR.  She has intermittent chronic back pain for years since a mild back injury, no new back pain, denies any abdominal pain. The patient denies claudication in legs with walking. The patient denies history of stroke or TIA symptoms. She denies any tingling, numbness, pain, or cold sensation in either hand/arm. She complains of intermittent mild right ankle swelling. She takes coumadin for history of PE. She also takes a daily statin. She reports COPD, mild dyspnea at times, uses home O2, 2L, prn and at night. She denies having an MI.  Pt Diabetic: Yes, pt reports her fasting glucose range is 90's to 150's Pt smoker: former smoker, quit in 2011   Past Medical History  Diagnosis Date  . Pulmonary embolism ([place])   . COPD (chronic obstructive pulmonary disease) ([place])   . DM (diabetes  mellitus) ([place])   . Dyslipidemia   . Hypertension   . Hyperlipidemia   . AAA (abdominal aortic aneurysm) ([place])   . Left foot infection 2013  . 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 Vesicovaginal fistula closure w/ tah    . Endovascular stent insertion  10/30/2011    Procedure: ENDOVASCULAR STENT GRAFT INSERTION;  Surgeon: Serafina Mitchell, MD;  Location: [facility] OR;  Service: Vascular;  Laterality: N/A;  . Abdominal aortic aneurysm repair  10-30-2011   Social History Social History  Substance Use Topics  . Smoking status: Former Smoker -- 0.50 packs/day for 71 years    Quit date: 01/23/2010  . Smokeless tobacco: Never Used  . Alcohol Use: No   Family History Family History  Problem Relation Age of Onset  . Heart disease Mother   . Hypertension Mother   . Heart attack Mother   . Heart disease Sister     x 3  . Hypertension Sister   . Heart attack Sister   . Clotting disorder Daughter   . Clotting disorder Son     multiple sons  . Diabetes Son   . Hypertension Father   . Diabetes Father   . Heart attack Father   . Hypertension Brother    Current Outpatient Prescriptions on File Prior to Visit  Medication Sig Dispense Refill  . albuterol (PROVENTIL) (2.5 MG/3ML) 0.083% nebulizer solution Take 2.5 mg by nebulization every 6 (six) hours as needed. For shortness of breath    .  Alcohol Swabs (ALCOHOL PREP) 70 % PADS     . aspirin 81 MG tablet Take 81 mg by mouth daily.      . Cholecalciferol (VITAMIN D3) 1000 UNITS CAPS Take 1 capsule by mouth daily.      Marland Kitchen EASY COMFORT INSULIN SYRINGE 30G X 1/2" 0.5 ML MISC     . glipiZIDE (GLUCOTROL) 5 MG tablet Take 5 mg by mouth daily.     Marland Kitchen glucose blood test strip     . HYDROcodone-acetaminophen (NORCO) 5-325 MG per tablet Take 1 tablet by mouth 4 (four) times daily as needed. For pain     . Lancet Devices (LANCING DEVICE) MISC     . Lancets (ONETOUCH ULTRASOFT) lancets Use as directed    .  lovastatin (MEVACOR) 20 MG tablet Take 20 mg by mouth at bedtime.      . metFORMIN (GLUCOPHAGE) 500 MG tablet Take 500 mg by mouth 2 (two) times daily with a meal.     . metoprolol (LOPRESSOR) 50 MG tablet Take 1 tablet by mouth 2 (two) times daily.     . Multiple Vitamin (MULTIVITAMIN) tablet Take 1 tablet by mouth daily.      Marland Kitchen omega-3 acid ethyl esters (LOVAZA) 1 G capsule Take 2 g by mouth 2 (two) times daily.    . ONE TOUCH ULTRA TEST test strip Use as directed    . pantoprazole (PROTONIX) 40 MG tablet     . tiotropium (SPIRIVA) 18 MCG inhalation capsule Place 1 capsule (18 mcg total) into inhaler and inhale daily. 90 capsule 3  . tiZANidine (ZANAFLEX) 2 MG tablet Take 1-2 tablets (2-4 mg total) by mouth every 6 (six) hours as needed for muscle spasms. 30 tablet 0  . traMADol (ULTRAM) 50 MG tablet Take 1 tablet (50 mg total) by mouth every 6 (six) hours as needed. 15 tablet 0  . triamterene-hydrochlorothiazide (MAXZIDE-25) 37.5-25 MG per tablet Take 1 tablet by mouth daily.      Marland Kitchen warfarin (COUMADIN) 5 MG tablet Take 5 mg by mouth as directed. Take at 6pm on an empty stomach     No current facility-administered medications on file prior to visit.   Allergies  Allergen Reactions  . Penicillins Nausea And Vomiting, Rash and Other (See Comments)    Childhood reaction     REVIEW OF SYSTEMS: See HPI for pertinent positives and negatives.    Physical Examination  Filed Vitals:   01/12/16 0906  BP: 138/72  Pulse: 100  Temp: 97.6 F (36.4 C)  TempSrc: Oral  Resp: 24  Height: '5\' 5"'$  (1.651 m)  Weight: 169 lb (76.658 kg)  SpO2: 92%   Body mass index is 28.12 kg/(m^2).  General: A&O x 3, WD.  Pulmonary: Sym exp, limited air movt, rales in most fields, no rhonchi, or wheezing,  + moist cough.  Cardiac: RRR, Nl S1, S2, no detected murmur.   Carotid Bruits Left Right   Negative Negative  Aorta is not palpable Radial pulses: right is 1+palpable, left radial and ulnar  are not palpable, left brachial pulse is 1+ palpable   VASCULAR EXAM:     LE Pulses LEFT RIGHT   FEMORAL 3+ palpable 3+ palpable    POPLITEAL not palpable  not palpable   POSTERIOR TIBIAL 1+ palpable  2+ palpable    DORSALIS PEDIS  ANTERIOR TIBIAL faintly palpable  faintly palpable     Skin: no rashes, no cellulitis.  Gastrointestinal: soft, NTND, -G/R, - HSM, - palpable masses, -  CVAT B.  Musculoskeletal: M/S 5/5 throughout, Extremities without ischemic changes.  Neurologic: CN 2-12 intact, Pain and light touch intact in extremities are intact except, Motor exam as listed above.        12/30/14 CTA abd/pelvis: Abdominal aorto bi-iliac stent graft as described which is patent with some intimal hyperplasia in the left iliac limb. There is no evidence of endoleak. Aneurysm sac today measures 5.7 x 5.7 cm and previously measured 5.8 x 5.9 cm.    Non-Invasive Vascular Imaging  EVAR Duplex (Date: 01/12/2016) ABDOMINAL AORTA DUPLEX EVALUATION - POST ENDOVASCULAR REPAIR    INDICATION: Post endovascular repair of abdominal aortic aneurysm.    PREVIOUS INTERVENTION(S): Endovascular repair of aortic aneurysm 10-30-11.    DUPLEX EXAM:      DIAMETER AP (cm) DIAMETER TRANSVERSE (cm) VELOCITIES (cm/sec)  Aorta 5.6 5.3 173  Right Common Iliac 1.4 1.44 55  Left Common Iliac 1.32 1.18 185    Comparison Study       Date DIAMETER AP (cm) DIAMETER TRANSVERSE (cm)  11-30-13 5.63 5.33     ADDITIONAL FINDINGS:     IMPRESSION:     Compared to the previous exam:  Stable compared to previous exam of 11-30-13.     Medical Decision Making  Kailee Essman Duba is a 82 y.o. female who presents s/p EVAR (Date: 10/30/11).  Pt is asymptomatic with stable sac size; 5.6 cm today, 5.7 cm by CTA on  12/30/14.  I discussed with the patient the importance of surveillance of the endograft.  The next endograft duplex will be scheduled for 12 months.  The patient will follow up with [place] in 12 months with these studies.  I emphasized the importance of maximal medical management including strict control of blood pressure, blood glucose, and lipid levels, antiplatelet agents, obtaining regular exercise, and cessation of smoking.   Thank you for allowing [place] to participate in this patient's care.  Clemon Chambers, RN, MSN, FNP-C [facility] [place] Office: [facility] Physician: Trula Slade  01/12/2016, 9:12 AM

## 2016-01-28 ENCOUNTER — Ambulatory Visit (INDEPENDENT_AMBULATORY_CARE_PROVIDER_SITE_OTHER): Payer: Medicare HMO | Admitting: General Practice

## 2016-01-28 DIAGNOSIS — Z5181 Encounter for therapeutic drug level monitoring: Secondary | ICD-10-CM

## 2016-01-28 LAB — POCT INR: INR: 4.1

## 2016-01-28 NOTE — Progress Notes (Signed)
I have reviewed and agree with the plan. 

## 2016-01-28 NOTE — Progress Notes (Signed)
Pre visit review using our clinic review tool, if applicable. No additional management support is needed unless otherwise documented below in the visit note. 

## 2016-02-03 DIAGNOSIS — J449 Chronic obstructive pulmonary disease, unspecified: Secondary | ICD-10-CM | POA: Diagnosis not present

## 2016-02-03 DIAGNOSIS — I2782 Chronic pulmonary embolism: Secondary | ICD-10-CM | POA: Diagnosis not present

## 2016-02-03 DIAGNOSIS — J129 Viral pneumonia, unspecified: Secondary | ICD-10-CM | POA: Diagnosis not present

## 2016-02-25 ENCOUNTER — Ambulatory Visit (INDEPENDENT_AMBULATORY_CARE_PROVIDER_SITE_OTHER): Payer: Medicare HMO | Admitting: General Practice

## 2016-02-25 DIAGNOSIS — Z5181 Encounter for therapeutic drug level monitoring: Secondary | ICD-10-CM

## 2016-02-25 LAB — POCT INR: INR: 2.1

## 2016-02-25 NOTE — Progress Notes (Signed)
I have reviewed and agree with the plan. 

## 2016-03-05 DIAGNOSIS — I2782 Chronic pulmonary embolism: Secondary | ICD-10-CM | POA: Diagnosis not present

## 2016-03-05 DIAGNOSIS — J129 Viral pneumonia, unspecified: Secondary | ICD-10-CM | POA: Diagnosis not present

## 2016-03-05 DIAGNOSIS — J449 Chronic obstructive pulmonary disease, unspecified: Secondary | ICD-10-CM | POA: Diagnosis not present

## 2016-03-11 DIAGNOSIS — E119 Type 2 diabetes mellitus without complications: Secondary | ICD-10-CM | POA: Diagnosis not present

## 2016-03-11 DIAGNOSIS — Z0100 Encounter for examination of eyes and vision without abnormal findings: Secondary | ICD-10-CM | POA: Diagnosis not present

## 2016-03-12 DIAGNOSIS — M5137 Other intervertebral disc degeneration, lumbosacral region: Secondary | ICD-10-CM | POA: Diagnosis not present

## 2016-03-12 DIAGNOSIS — M7532 Calcific tendinitis of left shoulder: Secondary | ICD-10-CM | POA: Diagnosis not present

## 2016-03-12 DIAGNOSIS — M1711 Unilateral primary osteoarthritis, right knee: Secondary | ICD-10-CM | POA: Diagnosis not present

## 2016-03-24 ENCOUNTER — Ambulatory Visit (INDEPENDENT_AMBULATORY_CARE_PROVIDER_SITE_OTHER): Payer: Medicare HMO | Admitting: General Practice

## 2016-03-24 DIAGNOSIS — Z5181 Encounter for therapeutic drug level monitoring: Secondary | ICD-10-CM

## 2016-03-24 LAB — POCT INR: INR: 3.5

## 2016-03-24 NOTE — Progress Notes (Signed)
I have reviewed and agree with the plan. 

## 2016-04-05 DIAGNOSIS — J129 Viral pneumonia, unspecified: Secondary | ICD-10-CM | POA: Diagnosis not present

## 2016-04-05 DIAGNOSIS — J449 Chronic obstructive pulmonary disease, unspecified: Secondary | ICD-10-CM | POA: Diagnosis not present

## 2016-04-05 DIAGNOSIS — I2782 Chronic pulmonary embolism: Secondary | ICD-10-CM | POA: Diagnosis not present

## 2016-04-14 DIAGNOSIS — J449 Chronic obstructive pulmonary disease, unspecified: Secondary | ICD-10-CM | POA: Diagnosis not present

## 2016-04-14 DIAGNOSIS — E119 Type 2 diabetes mellitus without complications: Secondary | ICD-10-CM | POA: Diagnosis not present

## 2016-04-14 DIAGNOSIS — K219 Gastro-esophageal reflux disease without esophagitis: Secondary | ICD-10-CM | POA: Diagnosis not present

## 2016-04-14 DIAGNOSIS — Z0000 Encounter for general adult medical examination without abnormal findings: Secondary | ICD-10-CM | POA: Diagnosis not present

## 2016-04-14 DIAGNOSIS — I10 Essential (primary) hypertension: Secondary | ICD-10-CM | POA: Diagnosis not present

## 2016-04-14 DIAGNOSIS — E7800 Pure hypercholesterolemia, unspecified: Secondary | ICD-10-CM | POA: Diagnosis not present

## 2016-04-14 DIAGNOSIS — I2699 Other pulmonary embolism without acute cor pulmonale: Secondary | ICD-10-CM | POA: Diagnosis not present

## 2016-04-21 ENCOUNTER — Ambulatory Visit (INDEPENDENT_AMBULATORY_CARE_PROVIDER_SITE_OTHER): Payer: Medicare HMO | Admitting: General Practice

## 2016-04-21 DIAGNOSIS — Z5181 Encounter for therapeutic drug level monitoring: Secondary | ICD-10-CM

## 2016-04-21 LAB — POCT INR: INR: 3.3

## 2016-05-05 DIAGNOSIS — J129 Viral pneumonia, unspecified: Secondary | ICD-10-CM | POA: Diagnosis not present

## 2016-05-05 DIAGNOSIS — I2782 Chronic pulmonary embolism: Secondary | ICD-10-CM | POA: Diagnosis not present

## 2016-05-05 DIAGNOSIS — J449 Chronic obstructive pulmonary disease, unspecified: Secondary | ICD-10-CM | POA: Diagnosis not present

## 2016-05-13 DIAGNOSIS — R69 Illness, unspecified: Secondary | ICD-10-CM | POA: Diagnosis not present

## 2016-05-19 ENCOUNTER — Ambulatory Visit (INDEPENDENT_AMBULATORY_CARE_PROVIDER_SITE_OTHER): Payer: Medicare HMO | Admitting: General Practice

## 2016-05-19 DIAGNOSIS — Z5181 Encounter for therapeutic drug level monitoring: Secondary | ICD-10-CM | POA: Diagnosis not present

## 2016-05-19 LAB — POCT INR: INR: 1.8

## 2016-05-19 NOTE — Patient Instructions (Signed)
Pre visit review using our clinic review tool, if applicable. No additional management support is needed unless otherwise documented below in the visit note. 

## 2016-05-19 NOTE — Progress Notes (Signed)
I have reviewed and agree with the plan. 

## 2016-06-05 DIAGNOSIS — I2782 Chronic pulmonary embolism: Secondary | ICD-10-CM | POA: Diagnosis not present

## 2016-06-05 DIAGNOSIS — J129 Viral pneumonia, unspecified: Secondary | ICD-10-CM | POA: Diagnosis not present

## 2016-06-05 DIAGNOSIS — J449 Chronic obstructive pulmonary disease, unspecified: Secondary | ICD-10-CM | POA: Diagnosis not present

## 2016-06-08 NOTE — Addendum Note (Signed)
Addended by: Lianne Cure A on: 06/08/2016 09:12 AM   Modules accepted: Orders

## 2016-06-16 ENCOUNTER — Ambulatory Visit (INDEPENDENT_AMBULATORY_CARE_PROVIDER_SITE_OTHER): Payer: Medicare HMO | Admitting: General Practice

## 2016-06-16 DIAGNOSIS — Z5181 Encounter for therapeutic drug level monitoring: Secondary | ICD-10-CM

## 2016-06-16 LAB — POCT INR: INR: 2.3

## 2016-06-16 NOTE — Patient Instructions (Signed)
Pre visit review using our clinic review tool, if applicable. No additional management support is needed unless otherwise documented below in the visit note. 

## 2016-06-17 NOTE — Progress Notes (Signed)
I have reviewed and agree with the plan. 

## 2016-07-05 DIAGNOSIS — I2782 Chronic pulmonary embolism: Secondary | ICD-10-CM | POA: Diagnosis not present

## 2016-07-05 DIAGNOSIS — J129 Viral pneumonia, unspecified: Secondary | ICD-10-CM | POA: Diagnosis not present

## 2016-07-05 DIAGNOSIS — J449 Chronic obstructive pulmonary disease, unspecified: Secondary | ICD-10-CM | POA: Diagnosis not present

## 2016-07-21 ENCOUNTER — Ambulatory Visit (INDEPENDENT_AMBULATORY_CARE_PROVIDER_SITE_OTHER): Payer: Medicare HMO | Admitting: General Practice

## 2016-07-21 DIAGNOSIS — E119 Type 2 diabetes mellitus without complications: Secondary | ICD-10-CM | POA: Diagnosis not present

## 2016-07-21 DIAGNOSIS — I2699 Other pulmonary embolism without acute cor pulmonale: Secondary | ICD-10-CM | POA: Diagnosis not present

## 2016-07-21 DIAGNOSIS — Z5181 Encounter for therapeutic drug level monitoring: Secondary | ICD-10-CM

## 2016-07-21 DIAGNOSIS — I10 Essential (primary) hypertension: Secondary | ICD-10-CM | POA: Diagnosis not present

## 2016-07-21 LAB — POCT INR: INR: 2.3

## 2016-07-21 NOTE — Progress Notes (Signed)
I have reviewed and agree with the plan. 

## 2016-07-21 NOTE — Patient Instructions (Signed)
Pre visit review using our clinic review tool, if applicable. No additional management support is needed unless otherwise documented below in the visit note. 

## 2016-07-26 DIAGNOSIS — E119 Type 2 diabetes mellitus without complications: Secondary | ICD-10-CM | POA: Diagnosis not present

## 2016-07-26 DIAGNOSIS — I2699 Other pulmonary embolism without acute cor pulmonale: Secondary | ICD-10-CM | POA: Diagnosis not present

## 2016-07-26 DIAGNOSIS — I10 Essential (primary) hypertension: Secondary | ICD-10-CM | POA: Diagnosis not present

## 2016-08-05 DIAGNOSIS — I2782 Chronic pulmonary embolism: Secondary | ICD-10-CM | POA: Diagnosis not present

## 2016-08-05 DIAGNOSIS — J129 Viral pneumonia, unspecified: Secondary | ICD-10-CM | POA: Diagnosis not present

## 2016-08-05 DIAGNOSIS — J449 Chronic obstructive pulmonary disease, unspecified: Secondary | ICD-10-CM | POA: Diagnosis not present

## 2016-08-12 DIAGNOSIS — E119 Type 2 diabetes mellitus without complications: Secondary | ICD-10-CM | POA: Diagnosis not present

## 2016-08-12 DIAGNOSIS — Z86711 Personal history of pulmonary embolism: Secondary | ICD-10-CM | POA: Diagnosis not present

## 2016-08-12 DIAGNOSIS — Z9981 Dependence on supplemental oxygen: Secondary | ICD-10-CM | POA: Diagnosis not present

## 2016-08-12 DIAGNOSIS — E7800 Pure hypercholesterolemia, unspecified: Secondary | ICD-10-CM | POA: Diagnosis not present

## 2016-08-12 DIAGNOSIS — J449 Chronic obstructive pulmonary disease, unspecified: Secondary | ICD-10-CM | POA: Diagnosis not present

## 2016-08-12 DIAGNOSIS — Z7984 Long term (current) use of oral hypoglycemic drugs: Secondary | ICD-10-CM | POA: Diagnosis not present

## 2016-08-12 DIAGNOSIS — K219 Gastro-esophageal reflux disease without esophagitis: Secondary | ICD-10-CM | POA: Diagnosis not present

## 2016-08-12 DIAGNOSIS — I10 Essential (primary) hypertension: Secondary | ICD-10-CM | POA: Diagnosis not present

## 2016-08-12 DIAGNOSIS — Z0000 Encounter for general adult medical examination without abnormal findings: Secondary | ICD-10-CM | POA: Diagnosis not present

## 2016-08-12 DIAGNOSIS — M1990 Unspecified osteoarthritis, unspecified site: Secondary | ICD-10-CM | POA: Diagnosis not present

## 2016-08-12 DIAGNOSIS — Z7901 Long term (current) use of anticoagulants: Secondary | ICD-10-CM | POA: Diagnosis not present

## 2016-08-18 ENCOUNTER — Ambulatory Visit: Payer: Medicare HMO

## 2016-08-23 DIAGNOSIS — R69 Illness, unspecified: Secondary | ICD-10-CM | POA: Diagnosis not present

## 2016-08-25 ENCOUNTER — Ambulatory Visit (INDEPENDENT_AMBULATORY_CARE_PROVIDER_SITE_OTHER): Payer: Medicare HMO | Admitting: General Practice

## 2016-08-25 DIAGNOSIS — Z5181 Encounter for therapeutic drug level monitoring: Secondary | ICD-10-CM

## 2016-08-25 LAB — POCT INR: INR: 2.2

## 2016-08-25 NOTE — Patient Instructions (Signed)
Pre visit review using our clinic review tool, if applicable. No additional management support is needed unless otherwise documented below in the visit note. 

## 2016-08-25 NOTE — Progress Notes (Signed)
I have reviewed and agree with the plan. 

## 2016-09-05 DIAGNOSIS — J449 Chronic obstructive pulmonary disease, unspecified: Secondary | ICD-10-CM | POA: Diagnosis not present

## 2016-09-05 DIAGNOSIS — J129 Viral pneumonia, unspecified: Secondary | ICD-10-CM | POA: Diagnosis not present

## 2016-09-05 DIAGNOSIS — I2782 Chronic pulmonary embolism: Secondary | ICD-10-CM | POA: Diagnosis not present

## 2016-09-22 ENCOUNTER — Ambulatory Visit (INDEPENDENT_AMBULATORY_CARE_PROVIDER_SITE_OTHER): Payer: Medicare HMO | Admitting: General Practice

## 2016-09-22 DIAGNOSIS — Z5181 Encounter for therapeutic drug level monitoring: Secondary | ICD-10-CM

## 2016-09-22 LAB — POCT INR: INR: 1.5

## 2016-09-22 NOTE — Patient Instructions (Signed)
Pre visit review using our clinic review tool, if applicable. No additional management support is needed unless otherwise documented below in the visit note. 

## 2016-09-22 NOTE — Progress Notes (Signed)
I have reviewed and agree with the plan. 

## 2016-10-03 DIAGNOSIS — J449 Chronic obstructive pulmonary disease, unspecified: Secondary | ICD-10-CM | POA: Diagnosis not present

## 2016-10-03 DIAGNOSIS — I2782 Chronic pulmonary embolism: Secondary | ICD-10-CM | POA: Diagnosis not present

## 2016-10-03 DIAGNOSIS — J129 Viral pneumonia, unspecified: Secondary | ICD-10-CM | POA: Diagnosis not present

## 2016-10-09 ENCOUNTER — Inpatient Hospital Stay (HOSPITAL_COMMUNITY)
Admission: EM | Admit: 2016-10-09 | Discharge: 2016-10-16 | DRG: 418 | Disposition: A | Discharge status: Home Health | Payer: Medicare HMO | Attending: Family Medicine | Admitting: Family Medicine

## 2016-10-09 ENCOUNTER — Emergency Department (HOSPITAL_COMMUNITY): Payer: Medicare HMO

## 2016-10-09 ENCOUNTER — Inpatient Hospital Stay (HOSPITAL_COMMUNITY): Payer: Medicare HMO

## 2016-10-09 ENCOUNTER — Encounter (HOSPITAL_COMMUNITY): Payer: Self-pay | Admitting: *Deleted

## 2016-10-09 DIAGNOSIS — K8050 Calculus of bile duct without cholangitis or cholecystitis without obstruction: Secondary | ICD-10-CM | POA: Diagnosis not present

## 2016-10-09 DIAGNOSIS — R932 Abnormal findings on diagnostic imaging of liver and biliary tract: Secondary | ICD-10-CM | POA: Diagnosis not present

## 2016-10-09 DIAGNOSIS — Z86711 Personal history of pulmonary embolism: Secondary | ICD-10-CM | POA: Diagnosis not present

## 2016-10-09 DIAGNOSIS — Z9981 Dependence on supplemental oxygen: Secondary | ICD-10-CM | POA: Diagnosis not present

## 2016-10-09 DIAGNOSIS — E1122 Type 2 diabetes mellitus with diabetic chronic kidney disease: Secondary | ICD-10-CM | POA: Diagnosis not present

## 2016-10-09 DIAGNOSIS — I480 Paroxysmal atrial fibrillation: Secondary | ICD-10-CM | POA: Diagnosis not present

## 2016-10-09 DIAGNOSIS — I471 Supraventricular tachycardia: Secondary | ICD-10-CM | POA: Diagnosis not present

## 2016-10-09 DIAGNOSIS — E1169 Type 2 diabetes mellitus with other specified complication: Secondary | ICD-10-CM | POA: Diagnosis present

## 2016-10-09 DIAGNOSIS — Z7901 Long term (current) use of anticoagulants: Secondary | ICD-10-CM

## 2016-10-09 DIAGNOSIS — R1010 Upper abdominal pain, unspecified: Secondary | ICD-10-CM

## 2016-10-09 DIAGNOSIS — K819 Cholecystitis, unspecified: Secondary | ICD-10-CM

## 2016-10-09 DIAGNOSIS — Z87891 Personal history of nicotine dependence: Secondary | ICD-10-CM

## 2016-10-09 DIAGNOSIS — I129 Hypertensive chronic kidney disease with stage 1 through stage 4 chronic kidney disease, or unspecified chronic kidney disease: Secondary | ICD-10-CM | POA: Diagnosis present

## 2016-10-09 DIAGNOSIS — K8020 Calculus of gallbladder without cholecystitis without obstruction: Secondary | ICD-10-CM | POA: Diagnosis not present

## 2016-10-09 DIAGNOSIS — E785 Hyperlipidemia, unspecified: Secondary | ICD-10-CM | POA: Diagnosis present

## 2016-10-09 DIAGNOSIS — K219 Gastro-esophageal reflux disease without esophagitis: Secondary | ICD-10-CM | POA: Diagnosis present

## 2016-10-09 DIAGNOSIS — Z79899 Other long term (current) drug therapy: Secondary | ICD-10-CM

## 2016-10-09 DIAGNOSIS — J449 Chronic obstructive pulmonary disease, unspecified: Secondary | ICD-10-CM | POA: Diagnosis present

## 2016-10-09 DIAGNOSIS — Z8249 Family history of ischemic heart disease and other diseases of the circulatory system: Secondary | ICD-10-CM

## 2016-10-09 DIAGNOSIS — R1013 Epigastric pain: Secondary | ICD-10-CM | POA: Diagnosis not present

## 2016-10-09 DIAGNOSIS — E1151 Type 2 diabetes mellitus with diabetic peripheral angiopathy without gangrene: Secondary | ICD-10-CM | POA: Diagnosis not present

## 2016-10-09 DIAGNOSIS — R109 Unspecified abdominal pain: Secondary | ICD-10-CM

## 2016-10-09 DIAGNOSIS — J9811 Atelectasis: Secondary | ICD-10-CM | POA: Diagnosis not present

## 2016-10-09 DIAGNOSIS — R1011 Right upper quadrant pain: Secondary | ICD-10-CM | POA: Diagnosis not present

## 2016-10-09 DIAGNOSIS — N183 Chronic kidney disease, stage 3 unspecified: Secondary | ICD-10-CM | POA: Diagnosis present

## 2016-10-09 DIAGNOSIS — R112 Nausea with vomiting, unspecified: Secondary | ICD-10-CM | POA: Diagnosis not present

## 2016-10-09 DIAGNOSIS — Z833 Family history of diabetes mellitus: Secondary | ICD-10-CM | POA: Diagnosis not present

## 2016-10-09 DIAGNOSIS — I482 Chronic atrial fibrillation, unspecified: Secondary | ICD-10-CM

## 2016-10-09 DIAGNOSIS — I4891 Unspecified atrial fibrillation: Secondary | ICD-10-CM | POA: Diagnosis not present

## 2016-10-09 DIAGNOSIS — K810 Acute cholecystitis: Secondary | ICD-10-CM

## 2016-10-09 DIAGNOSIS — Z01810 Encounter for preprocedural cardiovascular examination: Secondary | ICD-10-CM

## 2016-10-09 DIAGNOSIS — I4892 Unspecified atrial flutter: Secondary | ICD-10-CM | POA: Diagnosis not present

## 2016-10-09 DIAGNOSIS — J9611 Chronic respiratory failure with hypoxia: Secondary | ICD-10-CM | POA: Diagnosis present

## 2016-10-09 DIAGNOSIS — I714 Abdominal aortic aneurysm, without rupture: Secondary | ICD-10-CM | POA: Diagnosis present

## 2016-10-09 DIAGNOSIS — K8012 Calculus of gallbladder with acute and chronic cholecystitis without obstruction: Principal | ICD-10-CM | POA: Diagnosis present

## 2016-10-09 DIAGNOSIS — M542 Cervicalgia: Secondary | ICD-10-CM | POA: Diagnosis not present

## 2016-10-09 DIAGNOSIS — Z5181 Encounter for therapeutic drug level monitoring: Secondary | ICD-10-CM | POA: Diagnosis not present

## 2016-10-09 DIAGNOSIS — R935 Abnormal findings on diagnostic imaging of other abdominal regions, including retroperitoneum: Secondary | ICD-10-CM | POA: Diagnosis not present

## 2016-10-09 DIAGNOSIS — I10 Essential (primary) hypertension: Secondary | ICD-10-CM | POA: Diagnosis not present

## 2016-10-09 DIAGNOSIS — R1110 Vomiting, unspecified: Secondary | ICD-10-CM | POA: Diagnosis not present

## 2016-10-09 DIAGNOSIS — K8066 Calculus of gallbladder and bile duct with acute and chronic cholecystitis without obstruction: Secondary | ICD-10-CM | POA: Diagnosis not present

## 2016-10-09 LAB — COMPREHENSIVE METABOLIC PANEL
ALT: 17 U/L (ref 14–54)
AST: 25 U/L (ref 15–41)
Albumin: 3.6 g/dL (ref 3.5–5.0)
Alkaline Phosphatase: 32 U/L — ABNORMAL LOW (ref 38–126)
Anion gap: 12 (ref 5–15)
BUN: 19 mg/dL (ref 6–20)
CO2: 30 mmol/L (ref 22–32)
CREATININE: 1.05 mg/dL — AB (ref 0.44–1.00)
Calcium: 9.2 mg/dL (ref 8.9–10.3)
Chloride: 98 mmol/L — ABNORMAL LOW (ref 101–111)
GFR, EST AFRICAN AMERICAN: 56 mL/min — AB (ref >60)
GFR, EST NON AFRICAN AMERICAN: 48 mL/min — AB (ref >60)
Glucose, Bld: 224 mg/dL — ABNORMAL HIGH (ref 65–99)
Potassium: 3.5 mmol/L (ref 3.5–5.1)
Sodium: 140 mmol/L (ref 135–145)
Total Bilirubin: 0.6 mg/dL (ref 0.3–1.2)
Total Protein: 7.5 g/dL (ref 6.5–8.1)

## 2016-10-09 LAB — GLUCOSE, CAPILLARY
Glucose-Capillary: 104 mg/dL — ABNORMAL HIGH (ref 65–99)
Glucose-Capillary: 97 mg/dL (ref 65–99)
Glucose-Capillary: 134 mg/dL — ABNORMAL HIGH (ref 65–99)

## 2016-10-09 LAB — URINALYSIS, ROUTINE W REFLEX MICROSCOPIC
Bilirubin Urine: NEGATIVE
GLUCOSE, UA: 50 mg/dL — AB
HGB URINE DIPSTICK: NEGATIVE
Ketones, ur: NEGATIVE mg/dL
Nitrite: NEGATIVE
PROTEIN: 100 mg/dL — AB
Specific Gravity, Urine: 1.021 (ref 1.005–1.030)
pH: 5 (ref 5.0–8.0)

## 2016-10-09 LAB — PROTIME-INR
INR: 2.24
Prothrombin Time: 25.2 seconds — ABNORMAL HIGH (ref 11.4–15.2)

## 2016-10-09 LAB — CBC
HCT: 36 % (ref 36.0–46.0)
Hemoglobin: 12.1 g/dL (ref 12.0–15.0)
MCH: 26.2 pg (ref 26.0–34.0)
MCHC: 33.6 g/dL (ref 30.0–36.0)
MCV: 78.1 fL (ref 78.0–100.0)
PLATELETS: 250 10*3/uL (ref 150–400)
RBC: 4.61 MIL/uL (ref 3.87–5.11)
RDW: 17 % — ABNORMAL HIGH (ref 11.5–15.5)
WBC: 10.2 10*3/uL (ref 4.0–10.5)

## 2016-10-09 LAB — BRAIN NATRIURETIC PEPTIDE: B Natriuretic Peptide: 79.4 pg/mL (ref 0.0–100.0)

## 2016-10-09 LAB — TSH: TSH: 1.896 u[IU]/mL (ref 0.350–4.500)

## 2016-10-09 LAB — LIPASE, BLOOD: Lipase: 23 U/L (ref 11–51)

## 2016-10-09 MED ORDER — BISACODYL 10 MG RE SUPP
10.0000 mg | Freq: Every day | RECTAL | Status: DC | PRN
Start: 2016-10-09 — End: 2016-10-16
  Administered 2016-10-12: 10 mg via RECTAL
  Filled 2016-10-09: qty 1

## 2016-10-09 MED ORDER — MORPHINE SULFATE (PF) 4 MG/ML IV SOLN
2.0000 mg | INTRAVENOUS | Status: DC | PRN
  Administered 2016-10-10 – 2016-10-15 (×16): 2 mg via INTRAVENOUS
  Filled 2016-10-09 (×16): qty 1

## 2016-10-09 MED ORDER — ONDANSETRON HCL 4 MG PO TABS
4.0000 mg | ORAL_TABLET | Freq: Four times a day (QID) | ORAL | Status: DC | PRN
  Administered 2016-10-12: 4 mg via ORAL
  Filled 2016-10-09: qty 1

## 2016-10-09 MED ORDER — INSULIN ASPART 100 UNIT/ML ~~LOC~~ SOLN
0.0000 [IU] | Freq: Three times a day (TID) | SUBCUTANEOUS | Status: DC
  Administered 2016-10-10 – 2016-10-13 (×5): 1 [IU] via SUBCUTANEOUS

## 2016-10-09 MED ORDER — METOPROLOL TARTRATE 5 MG/5ML IV SOLN
5.0000 mg | Freq: Four times a day (QID) | INTRAVENOUS | Status: DC
  Administered 2016-10-09 – 2016-10-12 (×12): 5 mg via INTRAVENOUS
  Filled 2016-10-09 (×12): qty 5

## 2016-10-09 MED ORDER — HYDROCORTISONE 1 % EX CREA
TOPICAL_CREAM | Freq: Two times a day (BID) | CUTANEOUS | Status: DC | PRN
  Administered 2016-10-10: 17:00:00 via TOPICAL
  Filled 2016-10-09: qty 28

## 2016-10-09 MED ORDER — TIOTROPIUM BROMIDE MONOHYDRATE 18 MCG IN CAPS
18.0000 ug | ORAL_CAPSULE | Freq: Every day | RESPIRATORY_TRACT | Status: DC
  Administered 2016-10-10 – 2016-10-16 (×5): 18 ug via RESPIRATORY_TRACT
  Filled 2016-10-09 (×2): qty 5

## 2016-10-09 MED ORDER — PANTOPRAZOLE SODIUM 40 MG IV SOLR
40.0000 mg | INTRAVENOUS | Status: DC
  Administered 2016-10-09 – 2016-10-16 (×8): 40 mg via INTRAVENOUS
  Filled 2016-10-09 (×8): qty 40

## 2016-10-09 MED ORDER — SODIUM CHLORIDE 0.9 % IV SOLN: 250.0000 mL | INTRAVENOUS | Status: DC | PRN

## 2016-10-09 MED ORDER — IOPAMIDOL (ISOVUE-300) INJECTION 61%: 80.0000 mL | Freq: Once | INTRAVENOUS | Status: DC | PRN

## 2016-10-09 MED ORDER — SODIUM CHLORIDE 0.9% FLUSH: 3.0000 mL | INTRAVENOUS | Status: DC | PRN

## 2016-10-09 MED ORDER — MORPHINE SULFATE (PF) 4 MG/ML IV SOLN: 4.0000 mg | INTRAVENOUS | Status: DC | PRN

## 2016-10-09 MED ORDER — MORPHINE SULFATE (PF) 4 MG/ML IV SOLN: 4.0000 mg | Freq: Once | INTRAVENOUS | Status: DC

## 2016-10-09 MED ORDER — SODIUM CHLORIDE 0.9% FLUSH
3.0000 mL | Freq: Two times a day (BID) | INTRAVENOUS | Status: DC
  Administered 2016-10-09 – 2016-10-15 (×10): 3 mL via INTRAVENOUS

## 2016-10-09 MED ORDER — ONDANSETRON HCL 4 MG/2ML IJ SOLN: 4.0000 mg | Freq: Once | INTRAMUSCULAR | Status: DC

## 2016-10-09 MED ORDER — MORPHINE SULFATE (PF) 4 MG/ML IV SOLN
8.0000 mg | Freq: Once | INTRAVENOUS | Status: AC
  Administered 2016-10-09: 8 mg via INTRAMUSCULAR
  Filled 2016-10-09: qty 2

## 2016-10-09 MED ORDER — ONDANSETRON HCL 4 MG/2ML IJ SOLN
4.0000 mg | Freq: Four times a day (QID) | INTRAMUSCULAR | Status: DC | PRN
Start: 2016-10-09 — End: 2016-10-16
  Administered 2016-10-11 – 2016-10-14 (×7): 4 mg via INTRAVENOUS
  Filled 2016-10-09 (×6): qty 2

## 2016-10-09 MED ORDER — ALBUTEROL SULFATE (2.5 MG/3ML) 0.083% IN NEBU: 2.5000 mg | INHALATION_SOLUTION | RESPIRATORY_TRACT | Status: DC | PRN

## 2016-10-09 MED ORDER — ACETAMINOPHEN 500 MG PO TABS
500.0000 mg | ORAL_TABLET | Freq: Four times a day (QID) | ORAL | Status: DC | PRN
  Administered 2016-10-09 – 2016-10-10 (×2): 500 mg via ORAL
  Filled 2016-10-09 (×2): qty 1

## 2016-10-09 MED ORDER — ONDANSETRON 4 MG PO TBDP
8.0000 mg | ORAL_TABLET | Freq: Once | ORAL | Status: AC
  Administered 2016-10-09: 8 mg via ORAL
  Filled 2016-10-09: qty 2

## 2016-10-09 NOTE — ED Provider Notes (Signed)
Care assumed from Dr. Venora Maples.   At time of signout, patient is awaiting imaging studies of ultrasound and CT scan of the abdomen. Anticipate following up on results to determine disposition.  Radiology called to report that patient appears to have acute cholecystitis.  General surgery will be called for further management.  Given medical problems, pt will be admitted to hospitalist service with general surgery following.   PT admitted in stable condition.       Alejandra Allegra Reise Hietala, MD 10/09/16 [phone]

## 2016-10-09 NOTE — ED Notes (Signed)
Attempted IV x2. 

## 2016-10-09 NOTE — ED Notes (Signed)
Patient transported to X-ray 

## 2016-10-09 NOTE — ED Provider Notes (Addendum)
[facility] DEPT Provider Note   CSN: [id number] Arrival date & time: 10/09/16  7939     History   Chief Complaint Chief Complaint  Patient presents with  . Abdominal Pain    HPI Alejandra [name] is a 83 y.o. female.  HPI Patient reports upper abdominal pain with associated nausea vomiting which began tonight about midnight.  The blood in her vomit.  No diarrhea.  No blood in her stool.  Reports the discomfort is located in her upper abdomen.  She's never had pain or discomfort like this before.  She does have a history of pulmonary embolism for which she is on Coumadin.  Per the medical record she also has a history of abdominal aortic aneurysm s/p endovascular repair .  She denies back pain or flank pain at this time.  No weakness of her arms or legs.  No chest pain or shortness of breath.   Past Medical History:  Diagnosis Date  . AAA (abdominal aortic aneurysm) ([place])   . COPD (chronic obstructive pulmonary disease) ([place])   . DM (diabetes mellitus) ([place])   . Dyslipidemia   . Hyperlipidemia   . Hypertension   . Left foot infection 2013  . 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 2013  . Pulmonary embolism [facility])     Patient Active Problem List   Diagnosis Date Noted  . Neck pain 12/11/2015  . CKD stage 3 secondary to diabetes ([place]) 07/08/2015  . Routine general medical examination at a health care facility 07/07/2015  . Knee pain 12/10/2014  . Swelling of limb-Right  Leg 11/30/2013  . Aftercare following surgery of the circulatory system, [place] 11/30/2013  . Encounter for therapeutic drug monitoring 08/24/2013  . Abdominal aneurysm without mention of rupture 11/29/2011  . Abdominal pain, other specified site 10/29/2011  . HTN (hypertension) 10/29/2011  . Dyslipidemia 10/29/2011  . UNSPECIFIED VITAMIN D DEFICIENCY 09/21/2010  . PE 02/13/2010  . PNEUMONIA, ORGANISM UNSPECIFIED 02/13/2010  . C O P D 02/13/2010  . PULMONARY NODULE 02/13/2010    Past Surgical History:  Procedure  Laterality Date  . ABDOMINAL AORTIC ANEURYSM REPAIR  10-30-2011  . ENDOVASCULAR STENT INSERTION  10/30/2011   Procedure: ENDOVASCULAR STENT GRAFT INSERTION;  Surgeon: Serafina Mitchell, MD;  Location: [facility] OR;  Service: Vascular;  Laterality: N/A;  . VESICOVAGINAL FISTULA CLOSURE W/ TAH      OB History    No data available       Home Medications    Prior to Admission medications   Medication Sig Start Date End Date Taking? Authorizing Provider  albuterol (PROVENTIL) (2.5 MG/3ML) 0.083% nebulizer solution Take 2.5 mg by nebulization every 6 (six) hours as needed. For shortness of breath 05/07/11  Yes Brand Males, MD  aspirin 81 MG tablet Take 81 mg by mouth daily.     Yes Historical Provider, MD  Cholecalciferol (VITAMIN D3) 1000 UNITS CAPS Take 1 capsule by mouth daily.     Yes Historical Provider, MD  glipiZIDE (GLUCOTROL) 5 MG tablet Take 5 mg by mouth daily.    Yes Historical Provider, MD  HYDROcodone-acetaminophen (NORCO) 5-325 MG per tablet Take 1 tablet by mouth 4 (four) times daily as needed. For pain    Yes Historical Provider, MD  lovastatin (MEVACOR) 20 MG tablet Take 20 mg by mouth at bedtime.     Yes Historical Provider, MD  metFORMIN (GLUCOPHAGE) 500 MG tablet Take 500 mg by mouth 2 (two) times daily with a meal.    Yes Historical Provider,  MD  metoprolol (LOPRESSOR) 50 MG tablet Take 1 tablet by mouth 2 (two) times daily.  10/22/10  Yes Historical Provider, MD  Multiple Vitamin (MULTIVITAMIN) tablet Take 1 tablet by mouth daily.     Yes Historical Provider, MD  omega-3 acid ethyl esters (LOVAZA) 1 G capsule Take 2 g by mouth 2 (two) times daily.   Yes Historical Provider, MD  pantoprazole (PROTONIX) 40 MG tablet Take 40 mg by mouth daily.  05/26/12  Yes Historical Provider, MD  tiotropium (SPIRIVA) 18 MCG inhalation capsule Place 1 capsule (18 mcg total) into inhaler and inhale daily. 09/18/15  Yes Golden Circle, FNP  triamterene-hydrochlorothiazide (MAXZIDE-25) 37.5-25 MG  per tablet Take 1 tablet by mouth daily.     Yes Historical Provider, MD  warfarin (COUMADIN) 5 MG tablet Take 5 mg by mouth every evening.    Yes Historical Provider, MD  Alcohol Swabs (ALCOHOL PREP) 70 % PADS  03/29/12   Historical Provider, MD  EASY COMFORT INSULIN SYRINGE 30G X 1/2" 0.5 ML MISC  03/29/12   Historical Provider, MD  glucose blood test strip  05/24/12   Historical Provider, MD  Lancet Devices (LANCING DEVICE) [place]  03/29/12   Historical Provider, MD  Lancets (ONETOUCH ULTRASOFT) lancets Use as directed    Historical Provider, MD  ONE TOUCH ULTRA TEST test strip Use as directed    Historical Provider, MD  tiZANidine (ZANAFLEX) 2 MG tablet Take 1-2 tablets (2-4 mg total) by mouth every 6 (six) hours as needed for muscle spasms. Patient not taking: Reported on 10/09/2016 12/11/15   Golden Circle, FNP  traMADol (ULTRAM) 50 MG tablet Take 1 tablet (50 mg total) by mouth every 6 (six) hours as needed. Patient not taking: Reported on 10/09/2016 11/20/14   Tanna Furry, MD    Family History Family History  Problem Relation Age of Onset  . Heart disease Mother   . Hypertension Mother   . Heart attack Mother   . Hypertension Father   . Diabetes Father   . Heart attack Father   . Heart disease Sister     x 3  . Hypertension Sister   . Heart attack Sister   . Clotting disorder Daughter   . Clotting disorder Son     multiple sons  . Diabetes Son   . Hypertension Brother     Social History Social History  Substance Use Topics  . Smoking status: Former Smoker    Packs/day: 0.50    Years: 54.00    Quit date: 01/23/2010  . Smokeless tobacco: Never Used  . Alcohol use No     Allergies   Penicillins   Review of Systems Review of Systems  All other systems reviewed and are negative.    Physical Exam Updated Vital Signs BP (!) 171/84   Pulse 79   Temp 97.5 F (36.4 C) (Oral)   Resp 20   SpO2 95%   Physical Exam  Constitutional: She is oriented to person, place,  and time. She appears well-developed and well-nourished. No distress.  HENT:  Head: Normocephalic and atraumatic.  Eyes: EOM are normal.  Neck: Normal range of motion.  Cardiovascular: Normal rate, regular rhythm and normal heart sounds.   Pulmonary/Chest: Effort normal and breath sounds normal.  Abdominal: Soft. She exhibits no distension.  Epigastric and right upper quadrant tenderness without guarding or rebound  Musculoskeletal: Normal range of motion.  Neurological: She is alert and oriented to person, place, and time.  Skin: Skin  is warm and dry.  Psychiatric: She has a normal mood and affect. Judgment normal.  Nursing note and vitals reviewed.    ED Treatments / Results  Labs (all labs ordered are listed, but only abnormal results are displayed) Labs Reviewed  COMPREHENSIVE METABOLIC PANEL - Abnormal; Notable for the following:       Result Value   Chloride 98 (*)    Glucose, Bld 224 (*)    Creatinine, Ser 1.05 (*)    Alkaline Phosphatase 32 (*)    GFR calc non Af Amer 48 (*)    GFR calc Af Amer 56 (*)    All other components within normal limits  CBC - Abnormal; Notable for the following:    RDW 17.0 (*)    All other components within normal limits  URINALYSIS, ROUTINE W REFLEX MICROSCOPIC - Abnormal; Notable for the following:    Glucose, UA 50 (*)    Protein, ur 100 (*)    Leukocytes, UA LARGE (*)    Bacteria, UA RARE (*)    Squamous Epithelial / LPF 0-5 (*)    All other components within normal limits  URINE CULTURE  LIPASE, BLOOD    EKG  EKG Interpretation None       Radiology No results found.  Procedures Procedures (including critical care time)  Medications Ordered in ED Medications  ondansetron (ZOFRAN) injection 4 mg (not administered)  ondansetron (ZOFRAN-ODT) disintegrating tablet 8 mg (not administered)  morphine 4 MG/ML injection 8 mg (not administered)     Initial Impression / Assessment and Plan / ED Course  I have reviewed the  triage vital signs and the nursing notes.  Pertinent labs & imaging results that were available during my care of the patient were reviewed by me and considered in my medical decision making (see chart for details).     Upper abdominal pain with associated nausea vomiting.  Patient will undergo CT imaging of her abdomen.  INR pending at this time.  She does have a history of abdominal aneurysm but is not having significant back or flank pain at this time.  She has no hypotension or tachycardia.  Care to Dr Gustavus Messing.   Final Clinical Impressions(s) / ED Diagnoses   Final diagnoses:  None    New Prescriptions New Prescriptions   No medications on file     Jola Schmidt, MD 10/09/16 [id number]    Jola Schmidt, MD 10/09/16 [place], MD 10/26/16 [phone]

## 2016-10-09 NOTE — H&P (Signed)
[facility] History and Physical  CYNCERE RUHE KKX:[id number] DOB: [birth year] DOA: 10/09/2016  Referring physician: ER MD PCP: Mauricio Po, [place]  Chief Complaint: abd pain  HPI: Alejandra [name] is a 83 y.o. female with history of endovascular aneurysm repair on 10/30/2002 AAA, history of pulmonary embolism, noted on CT chest 01/24/10.  Patient states and since then she's been on Coumadin, COPD with chronic respiratory failure on home O2 of 2 L daily, diabetes, dyslipidemia, hypertension, hyperlipidemia, presented to the ED last night around 9 secondary to him creasing nausea with emesis as well as upper abdominal pains.  Patient of note, states pain started around midnight with sharp pains, nonradiating in the mid epigastrium. Of note, her son is at bedside and states that she minimizes her symptoms sometimes.  She states her nausea, emesis, as well as abdominal pain is much improved after they gave her pain meds in the ER.  Patient currently is not smoking. She quit about 6-7 years ago.  She currently lives with her 55 year old grandson who is now staying with them when asked why she is here in the hospital. Her grown son is at bedside.  She denies any acute problems currently. Denies cp/palpitations/pnd/orthopnea.  She states the other night her legs were swollen, but currently the are not. She is able to do her ADLs. She has a hard time going up /down stairs 2nd to doe and gets tired, but denies cp w/ exertion. No prior hx of MI/CVA per pt.  METS about 4.   In ED, initial VS 171/89, t 97.5, rr 17, hr 77 Pt appears very comfortable and able to give hx.     Review of Systems:  Per hpi, o/w all systems reviewed and negative.   Past Medical History:  Diagnosis Date  . AAA (abdominal aortic aneurysm) ([place])   . COPD (chronic obstructive pulmonary disease) ([place])   . DM (diabetes mellitus) ([place])   . Dyslipidemia   . Hyperlipidemia   . Hypertension   . Left foot infection  2013  . 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 2013  . Pulmonary embolism [facility])    Past Surgical History:  Procedure Laterality Date  . ABDOMINAL AORTIC ANEURYSM REPAIR  10-30-2011  . ENDOVASCULAR STENT INSERTION  10/30/2011   Procedure: ENDOVASCULAR STENT GRAFT INSERTION;  Surgeon: Serafina Mitchell, MD;  Location: [facility] OR;  Service: Vascular;  Laterality: N/A;  . VESICOVAGINAL FISTULA CLOSURE W/ TAH     Social History:  reports that she quit smoking about 6 years ago. She has a 27.00 pack-year smoking history. She has never used smokeless tobacco. She reports that she does not drink alcohol or use drugs.  Allergies  Allergen Reactions  . Penicillins Nausea And Vomiting, Rash and Other (See Comments)    Childhood reaction    Family History  Problem Relation Age of Onset  . Heart disease Mother   . Hypertension Mother   . Heart attack Mother   . Hypertension Father   . Diabetes Father   . Heart attack Father   . Heart disease Sister     x 3  . Hypertension Sister   . Heart attack Sister   . Clotting disorder Daughter   . Clotting disorder Son     multiple sons  . Diabetes Son   . Hypertension Brother     Prior to Admission medications   Medication Sig Start Date End Date Taking? Authorizing Provider  albuterol (PROVENTIL) (2.5 MG/3ML) 0.083% nebulizer solution Take 2.5  mg by nebulization every 6 (six) hours as needed. For shortness of breath 05/07/11  Yes Brand Males, MD  aspirin 81 MG tablet Take 81 mg by mouth daily.     Yes Historical Provider, MD  Cholecalciferol (VITAMIN D3) 1000 UNITS CAPS Take 1 capsule by mouth daily.     Yes Historical Provider, MD  glipiZIDE (GLUCOTROL) 5 MG tablet Take 5 mg by mouth daily.    Yes Historical Provider, MD  HYDROcodone-acetaminophen (NORCO) 5-325 MG per tablet Take 1 tablet by mouth 4 (four) times daily as needed. For pain    Yes Historical Provider, MD  lovastatin (MEVACOR) 20 MG tablet Take 20 mg by mouth at bedtime.     Yes Historical Provider, MD    metFORMIN (GLUCOPHAGE) 500 MG tablet Take 500 mg by mouth 2 (two) times daily with a meal.    Yes Historical Provider, MD  metoprolol (LOPRESSOR) 50 MG tablet Take 1 tablet by mouth 2 (two) times daily.  10/22/10  Yes Historical Provider, MD  Multiple Vitamin (MULTIVITAMIN) tablet Take 1 tablet by mouth daily.     Yes Historical Provider, MD  omega-3 acid ethyl esters (LOVAZA) 1 G capsule Take 2 g by mouth 2 (two) times daily.   Yes Historical Provider, MD  pantoprazole (PROTONIX) 40 MG tablet Take 40 mg by mouth daily.  05/26/12  Yes Historical Provider, MD  tiotropium (SPIRIVA) 18 MCG inhalation capsule Place 1 capsule (18 mcg total) into inhaler and inhale daily. 09/18/15  Yes Golden Circle, FNP  triamterene-hydrochlorothiazide (MAXZIDE-25) 37.5-25 MG per tablet Take 1 tablet by mouth daily.     Yes Historical Provider, MD  warfarin (COUMADIN) 5 MG tablet Take 5 mg by mouth every evening.    Yes Historical Provider, MD  Alcohol Swabs (ALCOHOL PREP) 70 % PADS  03/29/12   Historical Provider, MD  EASY COMFORT INSULIN SYRINGE 30G X 1/2" 0.5 ML MISC  03/29/12   Historical Provider, MD  glucose blood test strip  05/24/12   Historical Provider, MD  Lancet Devices (LANCING DEVICE) [place]  03/29/12   Historical Provider, MD  Lancets (ONETOUCH ULTRASOFT) lancets Use as directed    Historical Provider, MD  ONE TOUCH ULTRA TEST test strip Use as directed    Historical Provider, MD  tiZANidine (ZANAFLEX) 2 MG tablet Take 1-2 tablets (2-4 mg total) by mouth every 6 (six) hours as needed for muscle spasms. Patient not taking: Reported on 10/09/2016 12/11/15   Golden Circle, FNP  traMADol (ULTRAM) 50 MG tablet Take 1 tablet (50 mg total) by mouth every 6 (six) hours as needed. Patient not taking: Reported on 10/09/2016 11/20/14   Tanna Furry, MD   Physical Exam: Vitals:   10/09/16 1030 10/09/16 1045 10/09/16 1115 10/09/16 1215  BP: (!) 155/84 (!) 161/84  (!) 178/92  Pulse:   87 100  Resp: (!) 22 19  (!) 23   Temp:      TempSrc:      SpO2:   92% 98%    Wt Readings from Last 3 Encounters:  01/12/16 76.7 kg (169 lb)  12/11/15 76.6 kg (168 lb 12.8 oz)  07/07/15 77.6 kg (171 lb)    General:  Appears calm and comfortable, pleasant, NAD, AAOx3, pleasant, obese AAF. Eyes: PERRL, normal lids, irises & conjunctiva ENT: grossly normal hearing, lips & tongue, mmm Neck: no LAD, masses or thyromegaly Cardiovascular: RRR, no m/r/g. No LE edema. No jvd. Telemetry: SR, no arrhythmias  Respiratory: CTA bilaterally, no w/r/r.  Normal respiratory effort. Abdomen: soft, hypoactive BS, ttp meg and + Murphy's sign.  No g/r. Skin: no rash or induration seen on limited exam Musculoskeletal: grossly normal tone BUE/BLE Psychiatric: grossly normal mood and affect, speech fluent and appropriate Neurologic: grossly non-focal.          Labs on Admission:  Basic Metabolic Panel:  Recent Labs Lab 10/09/16 0455  NA 140  K 3.5  CL 98*  CO2 30  GLUCOSE 224*  BUN 19  CREATININE 1.05*  CALCIUM 9.2   Liver Function Tests:  Recent Labs Lab 10/09/16 0455  AST 25  ALT 17  ALKPHOS 32*  BILITOT 0.6  PROT 7.5  ALBUMIN 3.6    Recent Labs Lab 10/09/16 0455  LIPASE 23   No results for input(s): AMMONIA in the last 168 hours. CBC:  Recent Labs Lab 10/09/16 0455  WBC 10.2  HGB 12.1  HCT 36.0  MCV 78.1  PLT 250   Cardiac Enzymes: No results for input(s): CKTOTAL, CKMB, CKMBINDEX, TROPONINI in the last 168 hours.  BNP (last 3 results) No results for input(s): BNP in the last 8760 hours.  ProBNP (last 3 results) No results for input(s): PROBNP in the last 8760 hours.  CBG: No results for input(s): GLUCAP in the last 168 hours.  Radiological Exams on Admission: Dg Chest 2 View  Result Date: 10/09/2016 CLINICAL DATA:  Preoperative cardiovascular examination. Chest and epigastric region pain. EXAM: CHEST  2 VIEW COMPARISON:  Chest radiograph October 30, 2011; chest CT April 07, 2015  FINDINGS: There is mild atelectatic change in the lung bases. There is no appreciable edema or consolidation. Heart size and pulmonary vascularity are normal. There is atherosclerotic calcification in the aorta. No adenopathy. No bone lesions. IMPRESSION: Bibasilar atelectasis. No edema or consolidation. There is aortic atherosclerosis. Electronically Signed   By: Lowella Grip III M.D.   On: 10/09/2016 12:56   Ct Abdomen Pelvis W Contrast  Result Date: 10/09/2016 CLINICAL DATA:  83 year old female with a history of right upper quadrant pain with nausea and vomiting EXAM: CT ABDOMEN AND PELVIS WITH CONTRAST TECHNIQUE: Multidetector CT imaging of the abdomen and pelvis was performed using the standard protocol following bolus administration of intravenous contrast. CONTRAST:  80 cc Isovue-300 COMPARISON:  Ultrasound 10/09/2016 FINDINGS: Lower chest: Mixed linear/ nodular opacities at the lung bases compatible of atelectasis/ scarring. Nodule of the right middle lobe (image 2 series 4) is unchanged dating to the CT 12/30/2014, most likely benign. Hepatobiliary: Compare to prior there is mild dilation of the central bile ducts on the right an the left. With the common bile duct measuring as great is 10 mm, increased from the prior. The duct is dilated to the insertion to the duodenum. No radiopaque stone identified. Rapid tapering distally at the ampulla. Distention of the gallbladder with dependent radiopaque debris/stones. Mild gallbladder wall thickening and pericholecystic fluid. Pancreas: Unremarkable. No pancreatic ductal dilatation or surrounding inflammatory changes. Spleen: Normal in size without focal abnormality. Adrenals/Urinary Tract: Unremarkable appearance of the right adrenal gland. Unremarkable appearance of the left adrenal gland. Bilateral kidneys without hydronephrosis or nephrolithiasis. Unremarkable course the bilateral ureters. No perinephric stranding or inflammatory changes. Symmetric  perfusion. Stomach/Bowel: Unremarkable stomach and small bowel. No abnormally distended small bowel or colon. Colonic diverticular disease. No associated inflammatory changes. Normal appendix. Vascular/Lymphatic: Mixed calcified and soft plaque of the lower thoracic aorta and abdominal aorta. The patient has had endovascular repair of infrarenal abdominal aortic aneurysm with endograft. The proximal margin  of the graft terminates just below the renal arteries, without evidence of migration. The excluded aneurysm sac has similar diameter on axial images to the comparison CT, currently measuring 5.8 cm x 5.5 cm on image 44. Previous measurements at this location 5.8 cm x 5.6 cm. No periaortic fluid or inflammatory changes. Bilateral limbs are patent. Moderate to advanced bilateral iliac system atherosclerotic changes, though remain patent. Proximal femoral vasculature remain patent bilaterally. Bilateral hypogastric arteries patent. This study is not tailored for the most sensitive evaluation of a type 2 endoleak. Stenosis at the origin of the celiac artery, potentially secondary to atherosclerotic changes and/or overlying diaphragmatic crus. No stenosis at the origin of the superior mesenteric artery. Atherosclerotic changes at the origin the bilateral renal arteries, more pronounced on the left. Suprarenal aorta measures as great as 3.5 cm on the current study, which is increased from the comparison of 3.0 cm. Reproductive: Changes of hysterectomy Other: Small fact containing umbilical hernia. Musculoskeletal: Multilevel degenerative changes of the visualized spine. Changes most pronounced spanning L3-S1. Bilateral facet disease worst in the lower lumbar levels. No displaced fracture. Grade 1 anterolisthesis of L4 on L5 is unchanged. IMPRESSION: CT evidence of acute cholecystitis, with gallbladder distention, radiopaque stones/ debris, new intrahepatic bile duct dilation, and trace pericholecystic fluid. If a  confirmatory test is needed, HIDA scan would be the next imaging test. The common bile duct is dilated compared to the prior CT scan, measuring as large as 10 mm. Abrupt distal tapering at the ampulla with no radiopaque stone identified. This is nonspecific although could represent new obstruction related to non radiopaque stones, inflammatory stricture, or much less likely neoplasm. Correlation with ERCP and/ or MRCP may be considered. The patient has had infrarenal abdominal aortic aneurysm repair with aortic endograft, with the last surveillance study 12/30/2014. There has been no significant growth of the excluded aneurysm sac, however, the patient has diffuse aortic disease with increasing diameter of the suprarenal aorta, now measuring 3.5 cm. Continued surveillance is recommended. These results were called by telephone at the time of interpretation on 10/09/2016 at 10:17 am to Dr. Gustavus Messing, who verbally acknowledged these results. Diverticular disease without evidence of acute diverticulitis. Multilevel degenerative changes of the spine including grade 1 anterolisthesis of L4 on L5. Electronically Signed   By: Corrie Mckusick D.O.   On: 10/09/2016 10:19   US Abdomen Limited Ruq  Result Date: 10/09/2016 CLINICAL DATA:  Upper abdominal pain EXAM: US ABDOMEN LIMITED - RIGHT UPPER QUADRANT COMPARISON:  CT abdomen and pelvis December 30, 2014 FINDINGS: Gallbladder: Gallbladder is mildly distended. There are echogenic foci in the gallbladder which move and shadow consistent with gallstones. Largest gallstone measures 1.5 cm in length. There is no gallbladder wall thickening or pericholecystic fluid. There is no appreciable gallbladder wall thickening or pericholecystic fluid. No sonographic Murphy sign noted by sonographer. Common bile duct: Diameter: Distended at 10 mm. No mass or calculus is appreciable in the biliary ductal system. Liver: No focal lesion identified. Liver echogenicity is somewhat inhomogeneous and  overall increased. IMPRESSION: Cholelithiasis.  Gallbladder slightly distended. Common bile duct dilatation without mass or calculus seen by ultrasound. Increased liver echogenicity most likely is indicative of hepatic steatosis. While no focal liver lesions are evident on this study, it must be cautioned that the sensitivity of ultrasound for detection of focal liver lesions is diminished in this circumstance. Electronically Signed   By: Lowella Grip III M.D.   On: 10/09/2016 09:57    EKG: Independently reviewed.  -  no Ekg yet  EKG Interpretation  Date/Time:    Ventricular Rate:    PR Interval:    QRS Duration:   QT Interval:    QTC Calculation:   R Axis:     Text Interpretation:          Assessment/Plan Principal Problem:   Acute cholecystitis Active Problems:   HTN (hypertension), malignant   Chronic respiratory failure with hypoxia ([name])   COPD (chronic obstructive pulmonary disease) ([name])   CKD stage 3 secondary to diabetes ([name])   Anticoagulated on Coumadin   1. Acute cholecystitis w/ new intrahepatic bile duct dilation, nml lfts. - admit tele - GS /cs and will see, I talked to [place], they will take care of abx and will call GI if need for ercp - they want to see her first, surgery not today. - pain control - pending EKG, METS about 4, cxr unremarkable, will need better bp control prior to surgery, letting coumadin taper off.  Risk stratification pending ekg. - inr pending still - npo currently  2. htn, malignant - pt was on metoprolol 50bid and maxide 37.5-25 qd - both on hold. - given npo state, change bb to lopressor '5mg'$  iv q6hr for now, may need to add vasotec or hydralazine iv for better bp control - pending ekg  3. Dm w/ ckd 3, - cr appears stable currently - hold metformin - riss for now - chk a1c and tsh  4. hypercoag state on coumadin, for hx of prior AAA repair 10/30/02 and PE (dx 7/11) - pending inr still - holding asa 81 qd as well  5. Copd w/  chronic o2 dependence/resp failure on home o2 of 2L [place] - continues pulse ox - no signs of acute exacerbation currently - continue home alb nebs prn and spiriva qd - o2 [place] per Resp. Care  6. hld - holding lovastatin given npo status  7. gerd - ppi changed to iv protonix for now.   GS called by ER, I talked to [place] PA as well.  Code Status: Full (must indicate code status--if unknown or must be presumed, indicate so) DVT Prophylaxis: scds, pending inr (was on coumadin), surgery pending Family Communication: pt and son at bedside. (indicate person spoken with, if applicable, with phone number if by telephone) Disposition Plan: inpt tele (indicate anticipated LOS)  Time spent: 54mns  DMaren ReamerMD., MBA/MHA [facility] Pager [phone]

## 2016-10-09 NOTE — ED Notes (Signed)
Attempted repot x1.

## 2016-10-09 NOTE — Consult Note (Signed)
Reason for Consult: abdominal pain Referring Physician: Elaysia, Alejandra [name] is an 83 y.o. female.  HPI: 83 yo female with 1 day of epigastric abdominal pain. She woke with the pain this morning. She has nausea and vomiting. She denies diarrhea or constipation. She denies fevers or chills. She has not had any similar pains before.  She takes coumadin because she has had multiple blood clots in the past.  Past Medical History:  Diagnosis Date  . AAA (abdominal aortic aneurysm) ([place])   . COPD (chronic obstructive pulmonary disease) ([place])   . DM (diabetes mellitus) ([place])   . Dyslipidemia   . Hyperlipidemia   . Hypertension   . Left foot infection 2013  . 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 2013  . Pulmonary embolism [facility])     Past Surgical History:  Procedure Laterality Date  . ABDOMINAL AORTIC ANEURYSM REPAIR  10-30-2011  . ENDOVASCULAR STENT INSERTION  10/30/2011   Procedure: ENDOVASCULAR STENT GRAFT INSERTION;  Surgeon: Serafina Mitchell, MD;  Location: [facility] OR;  Service: Vascular;  Laterality: N/A;  . VESICOVAGINAL FISTULA CLOSURE W/ TAH      Family History  Problem Relation Age of Onset  . Heart disease Mother   . Hypertension Mother   . Heart attack Mother   . Hypertension Father   . Diabetes Father   . Heart attack Father   . Heart disease Sister     x 3  . Hypertension Sister   . Heart attack Sister   . Clotting disorder Daughter   . Clotting disorder Son     multiple sons  . Diabetes Son   . Hypertension Brother     Social History:  reports that she quit smoking about 6 years ago. She has a 27.00 pack-year smoking history. She has never used smokeless tobacco. She reports that she does not drink alcohol or use drugs.  Allergies:  Allergies  Allergen Reactions  . Penicillins Nausea And Vomiting, Rash and Other (See Comments)    Childhood reaction    Medications: I have reviewed the patient's current medications.  Results for orders placed or performed during the  hospital encounter of 10/09/16 (from the past 48 hour(s))  Lipase, blood     Status: None   Collection Time: 10/09/16  4:55 AM  Result Value Ref Range   Lipase 23 11 - 51 U/L  Comprehensive metabolic panel     Status: Abnormal   Collection Time: 10/09/16  4:55 AM  Result Value Ref Range   Sodium 140 135 - 145 mmol/L   Potassium 3.5 3.5 - 5.1 mmol/L   Chloride 98 (L) 101 - 111 mmol/L   CO2 30 22 - 32 mmol/L   Glucose, Bld 224 (H) 65 - 99 mg/dL   BUN 19 6 - 20 mg/dL   Creatinine, Ser 1.05 (H) 0.44 - 1.00 mg/dL   Calcium 9.2 8.9 - 10.3 mg/dL   Total Protein 7.5 6.5 - 8.1 g/dL   Albumin 3.6 3.5 - 5.0 g/dL   AST 25 15 - 41 U/L   ALT 17 14 - 54 U/L   Alkaline Phosphatase 32 (L) 38 - 126 U/L   Total Bilirubin 0.6 0.3 - 1.2 mg/dL   GFR calc non Af Amer 48 (L) >60 mL/min   GFR calc Af Amer 56 (L) >60 mL/min    Comment: (NOTE) The eGFR has been calculated using the CKD EPI equation. This calculation has not been validated in all clinical situations. eGFR's persistently <60  mL/min signify possible Chronic Kidney Disease.    Anion gap 12 5 - 15  CBC     Status: Abnormal   Collection Time: 10/09/16  4:55 AM  Result Value Ref Range   WBC 10.2 4.0 - 10.5 K/uL   RBC 4.61 3.87 - 5.11 MIL/uL   Hemoglobin 12.1 12.0 - 15.0 g/dL   HCT 36.0 36.0 - 46.0 %   MCV 78.1 78.0 - 100.0 fL   MCH 26.2 26.0 - 34.0 pg   MCHC 33.6 30.0 - 36.0 g/dL   RDW 17.0 (H) 11.5 - 15.5 %   Platelets 250 150 - 400 K/uL  Urinalysis, Routine w reflex microscopic     Status: Abnormal   Collection Time: 10/09/16  5:01 AM  Result Value Ref Range   Color, Urine YELLOW YELLOW   APPearance CLEAR CLEAR   Specific Gravity, Urine 1.021 1.005 - 1.030   pH 5.0 5.0 - 8.0   Glucose, UA 50 (A) NEGATIVE mg/dL   Hgb urine dipstick NEGATIVE NEGATIVE   Bilirubin Urine NEGATIVE NEGATIVE   Ketones, ur NEGATIVE NEGATIVE mg/dL   Protein, ur 100 (A) NEGATIVE mg/dL   Nitrite NEGATIVE NEGATIVE   Leukocytes, UA LARGE (A) NEGATIVE    RBC / HPF 0-5 0 - 5 RBC/hpf   WBC, UA TOO NUMEROUS TO COUNT 0 - 5 WBC/hpf   Bacteria, UA RARE (A) NONE SEEN   Squamous Epithelial / LPF 0-5 (A) NONE SEEN  Protime-INR     Status: Abnormal   Collection Time: 10/09/16  1:10 PM  Result Value Ref Range   Prothrombin Time 25.2 (H) 11.4 - 15.2 seconds   INR 2.24   TSH     Status: None   Collection Time: 10/09/16  1:10 PM  Result Value Ref Range   TSH 1.896 0.350 - 4.500 uIU/mL    Comment: Performed by a 3rd Generation assay with a functional sensitivity of <=0.01 uIU/mL.  Brain natriuretic peptide     Status: None   Collection Time: 10/09/16  1:10 PM  Result Value Ref Range   B Natriuretic Peptide 79.4 0.0 - 100.0 pg/mL  Glucose, capillary     Status: Abnormal   Collection Time: 10/09/16  2:15 PM  Result Value Ref Range   Glucose-Capillary 104 (H) 65 - 99 mg/dL    Dg Chest 2 View  Result Date: 10/09/2016 CLINICAL DATA:  Preoperative cardiovascular examination. Chest and epigastric region pain. EXAM: CHEST  2 VIEW COMPARISON:  Chest radiograph October 30, 2011; chest CT April 07, 2015 FINDINGS: There is mild atelectatic change in the lung bases. There is no appreciable edema or consolidation. Heart size and pulmonary vascularity are normal. There is atherosclerotic calcification in the aorta. No adenopathy. No bone lesions. IMPRESSION: Bibasilar atelectasis. No edema or consolidation. There is aortic atherosclerosis. Electronically Signed   By: Lowella Grip III M.D.   On: 10/09/2016 12:56   Ct Abdomen Pelvis W Contrast  Result Date: 10/09/2016 CLINICAL DATA:  83 year old female with a history of right upper quadrant pain with nausea and vomiting EXAM: CT ABDOMEN AND PELVIS WITH CONTRAST TECHNIQUE: Multidetector CT imaging of the abdomen and pelvis was performed using the standard protocol following bolus administration of intravenous contrast. CONTRAST:  80 cc Isovue-300 COMPARISON:  Ultrasound 10/09/2016 FINDINGS: Lower chest: Mixed  linear/ nodular opacities at the lung bases compatible of atelectasis/ scarring. Nodule of the right middle lobe (image 2 series 4) is unchanged dating to the CT 12/30/2014, most likely benign. Hepatobiliary: Compare  to prior there is mild dilation of the central bile ducts on the right an the left. With the common bile duct measuring as great is 10 mm, increased from the prior. The duct is dilated to the insertion to the duodenum. No radiopaque stone identified. Rapid tapering distally at the ampulla. Distention of the gallbladder with dependent radiopaque debris/stones. Mild gallbladder wall thickening and pericholecystic fluid. Pancreas: Unremarkable. No pancreatic ductal dilatation or surrounding inflammatory changes. Spleen: Normal in size without focal abnormality. Adrenals/Urinary Tract: Unremarkable appearance of the right adrenal gland. Unremarkable appearance of the left adrenal gland. Bilateral kidneys without hydronephrosis or nephrolithiasis. Unremarkable course the bilateral ureters. No perinephric stranding or inflammatory changes. Symmetric perfusion. Stomach/Bowel: Unremarkable stomach and small bowel. No abnormally distended small bowel or colon. Colonic diverticular disease. No associated inflammatory changes. Normal appendix. Vascular/Lymphatic: Mixed calcified and soft plaque of the lower thoracic aorta and abdominal aorta. The patient has had endovascular repair of infrarenal abdominal aortic aneurysm with endograft. The proximal margin of the graft terminates just below the renal arteries, without evidence of migration. The excluded aneurysm sac has similar diameter on axial images to the comparison CT, currently measuring 5.8 cm x 5.5 cm on image 44. Previous measurements at this location 5.8 cm x 5.6 cm. No periaortic fluid or inflammatory changes. Bilateral limbs are patent. Moderate to advanced bilateral iliac system atherosclerotic changes, though remain patent. Proximal femoral  vasculature remain patent bilaterally. Bilateral hypogastric arteries patent. This study is not tailored for the most sensitive evaluation of a type 2 endoleak. Stenosis at the origin of the celiac artery, potentially secondary to atherosclerotic changes and/or overlying diaphragmatic crus. No stenosis at the origin of the superior mesenteric artery. Atherosclerotic changes at the origin the bilateral renal arteries, more pronounced on the left. Suprarenal aorta measures as great as 3.5 cm on the current study, which is increased from the comparison of 3.0 cm. Reproductive: Changes of hysterectomy Other: Small fact containing umbilical hernia. Musculoskeletal: Multilevel degenerative changes of the visualized spine. Changes most pronounced spanning L3-S1. Bilateral facet disease worst in the lower lumbar levels. No displaced fracture. Grade 1 anterolisthesis of L4 on L5 is unchanged. IMPRESSION: CT evidence of acute cholecystitis, with gallbladder distention, radiopaque stones/ debris, new intrahepatic bile duct dilation, and trace pericholecystic fluid. If a confirmatory test is needed, HIDA scan would be the next imaging test. The common bile duct is dilated compared to the prior CT scan, measuring as large as 10 mm. Abrupt distal tapering at the ampulla with no radiopaque stone identified. This is nonspecific although could represent new obstruction related to non radiopaque stones, inflammatory stricture, or much less likely neoplasm. Correlation with ERCP and/ or MRCP may be considered. The patient has had infrarenal abdominal aortic aneurysm repair with aortic endograft, with the last surveillance study 12/30/2014. There has been no significant growth of the excluded aneurysm sac, however, the patient has diffuse aortic disease with increasing diameter of the suprarenal aorta, now measuring 3.5 cm. Continued surveillance is recommended. These results were called by telephone at the time of interpretation on  10/09/2016 at 10:17 am to Dr. Gustavus Messing, who verbally acknowledged these results. Diverticular disease without evidence of acute diverticulitis. Multilevel degenerative changes of the spine including grade 1 anterolisthesis of L4 on L5. Electronically Signed   By: Corrie Mckusick D.O.   On: 10/09/2016 10:19   US Abdomen Limited Ruq  Result Date: 10/09/2016 CLINICAL DATA:  Upper abdominal pain EXAM: US ABDOMEN LIMITED - RIGHT UPPER QUADRANT COMPARISON:  CT abdomen and pelvis December 30, 2014 FINDINGS: Gallbladder: Gallbladder is mildly distended. There are echogenic foci in the gallbladder which move and shadow consistent with gallstones. Largest gallstone measures 1.5 cm in length. There is no gallbladder wall thickening or pericholecystic fluid. There is no appreciable gallbladder wall thickening or pericholecystic fluid. No sonographic Murphy sign noted by sonographer. Common bile duct: Diameter: Distended at 10 mm. No mass or calculus is appreciable in the biliary ductal system. Liver: No focal lesion identified. Liver echogenicity is somewhat inhomogeneous and overall increased. IMPRESSION: Cholelithiasis.  Gallbladder slightly distended. Common bile duct dilatation without mass or calculus seen by ultrasound. Increased liver echogenicity most likely is indicative of hepatic steatosis. While no focal liver lesions are evident on this study, it must be cautioned that the sensitivity of ultrasound for detection of focal liver lesions is diminished in this circumstance. Electronically Signed   By: Lowella Grip III M.D.   On: 10/09/2016 09:57    Review of Systems  Constitutional: Negative for chills and fever.  HENT: Negative for hearing loss.   Eyes: Negative for blurred vision and double vision.  Respiratory: Negative for cough and hemoptysis.   Cardiovascular: Negative for chest pain and palpitations.  Gastrointestinal: Positive for nausea and vomiting. Negative for abdominal pain.  Genitourinary:  Negative for dysuria and urgency.  Musculoskeletal: Negative for myalgias and neck pain.  Skin: Negative for itching and rash.  Neurological: Negative for dizziness, tingling and headaches.  Endo/Heme/Allergies: Does not bruise/bleed easily.  Psychiatric/Behavioral: Negative for depression and suicidal ideas.   Blood pressure (!) 150/87, pulse 86, temperature 98.2 F (36.8 C), temperature source Oral, resp. rate 20, height _0  (1.651 m), weight 70.2 kg (154 lb 12.8 oz), SpO2 100 %. Physical Exam  Vitals reviewed. Constitutional: She is oriented to person, place, and time. She appears well-developed and well-nourished.  HENT:  Head: Normocephalic and atraumatic.  Eyes: Conjunctivae and EOM are normal. Pupils are equal, round, and reactive to light.  Neck: Normal range of motion. Neck supple.  Cardiovascular: Normal rate and regular rhythm.   Respiratory: Effort normal and breath sounds normal.  GI: Soft. Bowel sounds are normal. She exhibits no distension. There is no tenderness.  Musculoskeletal: Normal range of motion.  Neurological: She is alert and oriented to person, place, and time.  Skin: Skin is warm and dry.  Psychiatric: She has a normal mood and affect. Her behavior is normal.    Assessment/Plan: 83 yo female with concern for cholelithiasis. Her CBD is slightly enlarged (55m) to expected (834m without LFT elevation and she does have a 1.5cm stone. Currently she no longer has the pain. Her INR is 2.2 at this time. -we will continue to see her, if her abdominal pain recurs she may benefit from surgery, if it was only present for one morning we may observe instead -in any case her INR is 2.2, I recommend watching her clinically as the level drops naturally to where she may be ready for surgery.  LuArta Bruceinsinger 10/09/2016, 3:42 PM

## 2016-10-09 NOTE — ED Notes (Signed)
Attempted to gain IV access twice, without success.

## 2016-10-09 NOTE — ED Triage Notes (Signed)
The pt is c/o abd pain  With n and v that started at 0000

## 2016-10-10 LAB — COMPREHENSIVE METABOLIC PANEL
ALBUMIN: 3.7 g/dL (ref 3.5–5.0)
ALK PHOS: 31 U/L — AB (ref 38–126)
ALT: 16 U/L (ref 14–54)
AST: 26 U/L (ref 15–41)
Anion gap: 11 (ref 5–15)
BILIRUBIN TOTAL: 0.8 mg/dL (ref 0.3–1.2)
BUN: 14 mg/dL (ref 6–20)
CALCIUM: 9 mg/dL (ref 8.9–10.3)
CO2: 34 mmol/L — ABNORMAL HIGH (ref 22–32)
CREATININE: 1.09 mg/dL — AB (ref 0.44–1.00)
Chloride: 96 mmol/L — ABNORMAL LOW (ref 101–111)
GFR calc Af Amer: 53 mL/min — ABNORMAL LOW (ref >60)
GFR calc non Af Amer: 46 mL/min — ABNORMAL LOW (ref >60)
GLUCOSE: 131 mg/dL — AB (ref 65–99)
Potassium: 3.8 mmol/L (ref 3.5–5.1)
Sodium: 141 mmol/L (ref 135–145)
Total Protein: 7.9 g/dL (ref 6.5–8.1)

## 2016-10-10 LAB — URINE CULTURE: Culture: 30000 — AB

## 2016-10-10 LAB — HEMOGLOBIN A1C
Hgb A1c MFr Bld: 6.9 % — ABNORMAL HIGH (ref 4.8–5.6)
Mean Plasma Glucose: 151 mg/dL

## 2016-10-10 LAB — GLUCOSE, CAPILLARY
GLUCOSE-CAPILLARY: 103 mg/dL — AB (ref 65–99)
GLUCOSE-CAPILLARY: 136 mg/dL — AB (ref 65–99)
GLUCOSE-CAPILLARY: 138 mg/dL — AB (ref 65–99)
GLUCOSE-CAPILLARY: 156 mg/dL — AB (ref 65–99)
GLUCOSE-CAPILLARY: 57 mg/dL — AB (ref 65–99)

## 2016-10-10 LAB — CBC
HEMATOCRIT: 37.4 % (ref 36.0–46.0)
Hemoglobin: 12.8 g/dL (ref 12.0–15.0)
MCH: 26.8 pg (ref 26.0–34.0)
MCHC: 34.2 g/dL (ref 30.0–36.0)
MCV: 78.4 fL (ref 78.0–100.0)
Platelets: 222 10*3/uL (ref 150–400)
RBC: 4.77 MIL/uL (ref 3.87–5.11)
RDW: 17.1 % — AB (ref 11.5–15.5)
WBC: 7.7 10*3/uL (ref 4.0–10.5)

## 2016-10-10 LAB — PROTIME-INR
INR: 2.19
Prothrombin Time: 24.7 seconds — ABNORMAL HIGH (ref 11.4–15.2)

## 2016-10-10 LAB — APTT: aPTT: 34 seconds (ref 24–36)

## 2016-10-10 NOTE — Progress Notes (Signed)
PROGRESS NOTE  Alejandra [name]  XNT:[id number] DOB: August 14, 1931 DOA: 10/09/2016 PCP: Mauricio Po, FNP - [place]  Brief Narrative: Alejandra [name] is an 83 y.o. female with a history of AAA s/p endovascular repair 2004, PE 2011 on coumadin, COPD on 2L O2, DM, HTN, and HLD who presented 3/24 with epigastric abdominal pain, nausea and vomiting. Symptoms were significantly improved with pain medications in ED. In the ED she was afebrile, BP 171/89, HR 77. WBC 7.7. Abd U/S showed mild gallbladder distention with echogenic stones without wall thickening or pericholecystic fluid. CBG 62m without ductal system mass/calculus. Subsequent CT abdomen showed similar findings with trace pericholecystic fluid concerning for acute cholecystitis. AAA was also seen s/p endograft repair without significant interval dilatation. CXR showed bibasilar atelectasis without infiltrate or edema. General surgery was consulted, recommending monitoring while holding coumadin (INR 2.2 on arrival).  Assessment & Plan: Principal Problem:   Acute cholecystitis Active Problems:   COPD (chronic obstructive pulmonary disease) ([name])   Dyslipidemia   CKD stage 3 secondary to diabetes ([name])   Anticoagulated on Coumadin   HTN (hypertension), malignant   Chronic respiratory failure with hypoxia ([name])  Symptomatic cholelithiasis: CT with gallbladder distention, radiopaque stones/ debris, new intrahepatic bile duct dilation, and trace pericholecystic fluid. CBD up to 154mfrom 28m25mNo leukocytosis, LFTs wnl.  - General surgery following: Allow clears today, NPO p MN - Holding coumadin, monitoring INR daily - Pain is subsided, so continuing prns only.  - METS about 4. ECG unchanged RBBB from prior. No anginal symptoms.    HTN: Elevated on admission, now normotensive - Continue to hold metoprolol '50mg'$  BID and maxide 37.5 - '25mg'$  - Continue lopressor '5mg'$  IV q6hr as this is providing good control and pt will be NPO again. - Would  consider hydralazine IV as needed if becomes uncontrolled.  T2DM: Well-controlled, HbA1c 6.9%.  - Hold metformin - SSI  Chronic anticoagulation with coumadin for hx of prior AAA repair 2004 and PE (dx 01/2010). INR 2.24 on admission, will allow to trend down by holding coumadin. No indication for reversal at this time.  - Holding ASA in preparation for surgery as well  Chronic hypoxemic respiratory failure due to COPD: No exacerbation is evident, stable on home 2L by [place]. CXR with atelectasis only.  - Continue home spiriva, nebulizers - Continue 2L O2 by [place].   GERD: Chronic, stable.  - IV PPI due to NPO  AAA: No significant growth since last surveillance 12/30/2014, though CT showed diffuse aortic disease with increasing diameter of the suprarenal aorta, now measuring 3.5 cm.  - Continued surveillance of suprarenal aorta is also recommended. - Continue statin - Holding ASA for now  DVT prophylaxis: SCD Code Status: Full Family Communication: None at bedside this AM Disposition Plan: NPO after midnight. Uncertain dispo at this time.   Consultants:   General surgery, Dr. [place]eth [place] Silosrocedures:   None  Antimicrobials:  None   Subjective: Pt without abdominal pain, nausea or vomiting at this time. Ready to eat something.   Objective: Vitals:   10/10/16 0055 10/10/16 0441 10/10/16 0953 10/10/16 1005  BP: 132/77 130/78 140/77   Pulse: 89 (!) 101 (!) 108   Resp:  16 18   Temp:  98.7 F (37.1 C) 97.9 F (36.6 C)   TempSrc:  Oral Oral   SpO2:  97% 92% 97%  Weight:      Height:        Intake/Output Summary (Last 24 hours) at  10/10/16 1542 Last data filed at 10/10/16 1300  Gross per 24 hour  Intake              822 ml  Output              300 ml  Net              522 ml   Filed Weights   10/09/16 1420 10/09/16 2046  Weight: 70.2 kg (154 lb 12.8 oz) 71.8 kg (158 lb 4.8 oz)    Examination: General exam: Elderly female in no distress. Respiratory  system: Non-labored breathing 2L by [place]. Diminished but clear to auscultation bilaterally.  Cardiovascular system: Regular rate and rhythm. No murmur, rub, or gallop. No JVD, and no pedal edema. Gastrointestinal system: Abdomen soft, not tender, non-distended, with normoactive bowel sounds. No organomegaly or masses felt. Central nervous system: Alert and oriented. No focal neurological deficits. Extremities: Warm, no deformities Skin: No rashes, lesions no ulcers Psychiatry: Judgement and insight appear normal. Mood & affect appropriate.   Data Reviewed: I have personally reviewed following labs and imaging studies  CBC:  Recent Labs Lab 10/09/16 0455 10/10/16 0530  WBC 10.2 7.7  HGB 12.1 12.8  HCT 36.0 37.4  MCV 78.1 78.4  PLT 250 469   Basic Metabolic Panel:  Recent Labs Lab 10/09/16 0455 10/10/16 0530  NA 140 141  K 3.5 3.8  CL 98* 96*  CO2 30 34*  GLUCOSE 224* 131*  BUN 19 14  CREATININE 1.05* 1.09*  CALCIUM 9.2 9.0   GFR: Estimated Creatinine Clearance: 39.5 mL/min (A) (by C-G formula based on SCr of 1.09 mg/dL (H)). Liver Function Tests:  Recent Labs Lab 10/09/16 0455 10/10/16 0530  AST 25 26  ALT 17 16  ALKPHOS 32* 31*  BILITOT 0.6 0.8  PROT 7.5 7.9  ALBUMIN 3.6 3.7    Recent Labs Lab 10/09/16 0455  LIPASE 23   No results for input(s): AMMONIA in the last 168 hours. Coagulation Profile:  Recent Labs Lab 10/09/16 1310 10/10/16 0530  INR 2.24 2.19   Cardiac Enzymes: No results for input(s): CKTOTAL, CKMB, CKMBINDEX, TROPONINI in the last 168 hours. BNP (last 3 results) No results for input(s): PROBNP in the last 8760 hours. HbA1C:  Recent Labs  10/09/16 1310  HGBA1C 6.9*   CBG:  Recent Labs Lab 10/09/16 1716 10/09/16 2050 10/10/16 0732 10/10/16 1211 10/10/16 1254  GLUCAP 97 134* 103* 57* 156*   Lipid Profile: No results for input(s): CHOL, HDL, LDLCALC, TRIG, CHOLHDL, LDLDIRECT in the last 72 hours. Thyroid Function  Tests:  Recent Labs  10/09/16 1310  TSH 1.896   Anemia Panel: No results for input(s): VITAMINB12, FOLATE, FERRITIN, TIBC, IRON, RETICCTPCT in the last 72 hours. Urine analysis:    Component Value Date/Time   COLORURINE YELLOW 10/09/2016 0501   APPEARANCEUR CLEAR 10/09/2016 0501   LABSPEC 1.021 10/09/2016 0501   PHURINE 5.0 10/09/2016 0501   GLUCOSEU 50 (A) 10/09/2016 0501   HGBUR NEGATIVE 10/09/2016 0501   BILIRUBINUR NEGATIVE 10/09/2016 0501   KETONESUR NEGATIVE 10/09/2016 0501   PROTEINUR 100 (A) 10/09/2016 0501   UROBILINOGEN 0.2 10/29/2011 1414   NITRITE NEGATIVE 10/09/2016 0501   LEUKOCYTESUR LARGE (A) 10/09/2016 0501   Recent Results (from the past 240 hour(s))  Urine culture     Status: Abnormal   Collection Time: 10/09/16  5:33 AM  Result Value Ref Range Status   Specimen Description URINE, RANDOM  Final   Special Requests NONE  Final   Culture (A)  Final    30,000 COLONIES/mL STREPTOCOCCUS AGALACTIAE TESTING AGAINST S. AGALACTIAE NOT ROUTINELY PERFORMED DUE TO PREDICTABILITY OF AMP/PEN/VAN SUSCEPTIBILITY.    Report Status 10/10/2016 FINAL  Final      Radiology Studies: Dg Chest 2 View  Result Date: 10/09/2016 CLINICAL DATA:  Preoperative cardiovascular examination. Chest and epigastric region pain. EXAM: CHEST  2 VIEW COMPARISON:  Chest radiograph October 30, 2011; chest CT April 07, 2015 FINDINGS: There is mild atelectatic change in the lung bases. There is no appreciable edema or consolidation. Heart size and pulmonary vascularity are normal. There is atherosclerotic calcification in the aorta. No adenopathy. No bone lesions. IMPRESSION: Bibasilar atelectasis. No edema or consolidation. There is aortic atherosclerosis. Electronically Signed   By: Lowella Grip III M.D.   On: 10/09/2016 12:56   Ct Abdomen Pelvis W Contrast  Result Date: 10/09/2016 CLINICAL DATA:  83 year old female with a history of right upper quadrant pain with nausea and vomiting EXAM:  CT ABDOMEN AND PELVIS WITH CONTRAST TECHNIQUE: Multidetector CT imaging of the abdomen and pelvis was performed using the standard protocol following bolus administration of intravenous contrast. CONTRAST:  80 cc Isovue-300 COMPARISON:  Ultrasound 10/09/2016 FINDINGS: Lower chest: Mixed linear/ nodular opacities at the lung bases compatible of atelectasis/ scarring. Nodule of the right middle lobe (image 2 series 4) is unchanged dating to the CT 12/30/2014, most likely benign. Hepatobiliary: Compare to prior there is mild dilation of the central bile ducts on the right an the left. With the common bile duct measuring as great is 10 mm, increased from the prior. The duct is dilated to the insertion to the duodenum. No radiopaque stone identified. Rapid tapering distally at the ampulla. Distention of the gallbladder with dependent radiopaque debris/stones. Mild gallbladder wall thickening and pericholecystic fluid. Pancreas: Unremarkable. No pancreatic ductal dilatation or surrounding inflammatory changes. Spleen: Normal in size without focal abnormality. Adrenals/Urinary Tract: Unremarkable appearance of the right adrenal gland. Unremarkable appearance of the left adrenal gland. Bilateral kidneys without hydronephrosis or nephrolithiasis. Unremarkable course the bilateral ureters. No perinephric stranding or inflammatory changes. Symmetric perfusion. Stomach/Bowel: Unremarkable stomach and small bowel. No abnormally distended small bowel or colon. Colonic diverticular disease. No associated inflammatory changes. Normal appendix. Vascular/Lymphatic: Mixed calcified and soft plaque of the lower thoracic aorta and abdominal aorta. The patient has had endovascular repair of infrarenal abdominal aortic aneurysm with endograft. The proximal margin of the graft terminates just below the renal arteries, without evidence of migration. The excluded aneurysm sac has similar diameter on axial images to the comparison CT,  currently measuring 5.8 cm x 5.5 cm on image 44. Previous measurements at this location 5.8 cm x 5.6 cm. No periaortic fluid or inflammatory changes. Bilateral limbs are patent. Moderate to advanced bilateral iliac system atherosclerotic changes, though remain patent. Proximal femoral vasculature remain patent bilaterally. Bilateral hypogastric arteries patent. This study is not tailored for the most sensitive evaluation of a type 2 endoleak. Stenosis at the origin of the celiac artery, potentially secondary to atherosclerotic changes and/or overlying diaphragmatic crus. No stenosis at the origin of the superior mesenteric artery. Atherosclerotic changes at the origin the bilateral renal arteries, more pronounced on the left. Suprarenal aorta measures as great as 3.5 cm on the current study, which is increased from the comparison of 3.0 cm. Reproductive: Changes of hysterectomy Other: Small fact containing umbilical hernia. Musculoskeletal: Multilevel degenerative changes of the visualized spine. Changes most pronounced spanning L3-S1. Bilateral facet disease worst in the  lower lumbar levels. No displaced fracture. Grade 1 anterolisthesis of L4 on L5 is unchanged. IMPRESSION: CT evidence of acute cholecystitis, with gallbladder distention, radiopaque stones/ debris, new intrahepatic bile duct dilation, and trace pericholecystic fluid. If a confirmatory test is needed, HIDA scan would be the next imaging test. The common bile duct is dilated compared to the prior CT scan, measuring as large as 10 mm. Abrupt distal tapering at the ampulla with no radiopaque stone identified. This is nonspecific although could represent new obstruction related to non radiopaque stones, inflammatory stricture, or much less likely neoplasm. Correlation with ERCP and/ or MRCP may be considered. The patient has had infrarenal abdominal aortic aneurysm repair with aortic endograft, with the last surveillance study 12/30/2014. There has been  no significant growth of the excluded aneurysm sac, however, the patient has diffuse aortic disease with increasing diameter of the suprarenal aorta, now measuring 3.5 cm. Continued surveillance is recommended. These results were called by telephone at the time of interpretation on 10/09/2016 at 10:17 am to Dr. Gustavus Messing, who verbally acknowledged these results. Diverticular disease without evidence of acute diverticulitis. Multilevel degenerative changes of the spine including grade 1 anterolisthesis of L4 on L5. Electronically Signed   By: Corrie Mckusick D.O.   On: 10/09/2016 10:19   US Abdomen Limited Ruq  Result Date: 10/09/2016 CLINICAL DATA:  Upper abdominal pain EXAM: US ABDOMEN LIMITED - RIGHT UPPER QUADRANT COMPARISON:  CT abdomen and pelvis December 30, 2014 FINDINGS: Gallbladder: Gallbladder is mildly distended. There are echogenic foci in the gallbladder which move and shadow consistent with gallstones. Largest gallstone measures 1.5 cm in length. There is no gallbladder wall thickening or pericholecystic fluid. There is no appreciable gallbladder wall thickening or pericholecystic fluid. No sonographic Murphy sign noted by sonographer. Common bile duct: Diameter: Distended at 10 mm. No mass or calculus is appreciable in the biliary ductal system. Liver: No focal lesion identified. Liver echogenicity is somewhat inhomogeneous and overall increased. IMPRESSION: Cholelithiasis.  Gallbladder slightly distended. Common bile duct dilatation without mass or calculus seen by ultrasound. Increased liver echogenicity most likely is indicative of hepatic steatosis. While no focal liver lesions are evident on this study, it must be cautioned that the sensitivity of ultrasound for detection of focal liver lesions is diminished in this circumstance. Electronically Signed   By: Lowella Grip III M.D.   On: 10/09/2016 09:57    Scheduled Meds: . insulin aspart  0-9 Units Subcutaneous TID WC  . metoprolol  5 mg  Intravenous Q6H  . ondansetron (ZOFRAN) IV  4 mg Intravenous Once  . pantoprazole (PROTONIX) IV  40 mg Intravenous Q24H  . sodium chloride flush  3 mL Intravenous Q12H  . tiotropium  18 mcg Inhalation Daily   Continuous Infusions:   LOS: 1 day   Time spent: 25 minutes.  Vance Gather, MD [facility] Pager [phone]  If 7PM-7AM, please contact night-coverage [email or web address] Password [facility] 10/10/2016, 3:42 PM

## 2016-10-10 NOTE — Progress Notes (Signed)
Subjective: No further abdominal pain  Objective: Vital signs in last 24 hours: Temp:  [98.2 F (36.8 C)-98.9 F (37.2 C)] 98.7 F (37.1 C) (03/25 0441) Pulse Rate:  [86-101] 101 (03/25 0441) Resp:  [16-23] 16 (03/25 0441) BP: (123-178)/(47-92) 130/78 (03/25 0441) SpO2:  [92 %-100 %] 97 % (03/25 0441) Weight:  [70.2 kg (154 lb 12.8 oz)-71.8 kg (158 lb 4.8 oz)] 71.8 kg (158 lb 4.8 oz) (03/24 2046)    Intake/Output from previous day: 03/24 0701 - 03/25 0700 In: 120 [P.O.:120] Out: 200 [Urine:200] Intake/Output this shift: No intake/output data recorded.  General appearance: cooperative Resp: clear to auscultation bilaterally GI: soft, NT  Lab Results:   Recent Labs  10/09/16 0455 10/10/16 0530  WBC 10.2 7.7  HGB 12.1 12.8  HCT 36.0 37.4  PLT 250 222   BMET  Recent Labs  10/09/16 0455 10/10/16 0530  NA 140 141  K 3.5 3.8  CL 98* 96*  CO2 30 34*  GLUCOSE 224* 131*  BUN 19 14  CREATININE 1.05* 1.09*  CALCIUM 9.2 9.0   PT/INR  Recent Labs  10/09/16 1310 10/10/16 0530  LABPROT 25.2* 24.7*  INR 2.24 2.19   ABG No results for input(s): PHART, HCO3 in the last 72 hours.  Invalid input(s): PCO2, PO2  Studies/Results: Dg Chest 2 View  Result Date: 10/09/2016 CLINICAL DATA:  Preoperative cardiovascular examination. Chest and epigastric region pain. EXAM: CHEST  2 VIEW COMPARISON:  Chest radiograph October 30, 2011; chest CT April 07, 2015 FINDINGS: There is mild atelectatic change in the lung bases. There is no appreciable edema or consolidation. Heart size and pulmonary vascularity are normal. There is atherosclerotic calcification in the aorta. No adenopathy. No bone lesions. IMPRESSION: Bibasilar atelectasis. No edema or consolidation. There is aortic atherosclerosis. Electronically Signed   By: Lowella Grip III M.D.   On: 10/09/2016 12:56   Ct Abdomen Pelvis W Contrast  Result Date: 10/09/2016 CLINICAL DATA:  83 year old female with a  history of right upper quadrant pain with nausea and vomiting EXAM: CT ABDOMEN AND PELVIS WITH CONTRAST TECHNIQUE: Multidetector CT imaging of the abdomen and pelvis was performed using the standard protocol following bolus administration of intravenous contrast. CONTRAST:  80 cc Isovue-300 COMPARISON:  Ultrasound 10/09/2016 FINDINGS: Lower chest: Mixed linear/ nodular opacities at the lung bases compatible of atelectasis/ scarring. Nodule of the right middle lobe (image 2 series 4) is unchanged dating to the CT 12/30/2014, most likely benign. Hepatobiliary: Compare to prior there is mild dilation of the central bile ducts on the right an the left. With the common bile duct measuring as great is 10 mm, increased from the prior. The duct is dilated to the insertion to the duodenum. No radiopaque stone identified. Rapid tapering distally at the ampulla. Distention of the gallbladder with dependent radiopaque debris/stones. Mild gallbladder wall thickening and pericholecystic fluid. Pancreas: Unremarkable. No pancreatic ductal dilatation or surrounding inflammatory changes. Spleen: Normal in size without focal abnormality. Adrenals/Urinary Tract: Unremarkable appearance of the right adrenal gland. Unremarkable appearance of the left adrenal gland. Bilateral kidneys without hydronephrosis or nephrolithiasis. Unremarkable course the bilateral ureters. No perinephric stranding or inflammatory changes. Symmetric perfusion. Stomach/Bowel: Unremarkable stomach and small bowel. No abnormally distended small bowel or colon. Colonic diverticular disease. No associated inflammatory changes. Normal appendix. Vascular/Lymphatic: Mixed calcified and soft plaque of the lower thoracic aorta and abdominal aorta. The patient has had endovascular repair of infrarenal abdominal aortic aneurysm with endograft. The proximal margin of the graft terminates  just below the renal arteries, without evidence of migration. The excluded aneurysm  sac has similar diameter on axial images to the comparison CT, currently measuring 5.8 cm x 5.5 cm on image 44. Previous measurements at this location 5.8 cm x 5.6 cm. No periaortic fluid or inflammatory changes. Bilateral limbs are patent. Moderate to advanced bilateral iliac system atherosclerotic changes, though remain patent. Proximal femoral vasculature remain patent bilaterally. Bilateral hypogastric arteries patent. This study is not tailored for the most sensitive evaluation of a type 2 endoleak. Stenosis at the origin of the celiac artery, potentially secondary to atherosclerotic changes and/or overlying diaphragmatic crus. No stenosis at the origin of the superior mesenteric artery. Atherosclerotic changes at the origin the bilateral renal arteries, more pronounced on the left. Suprarenal aorta measures as great as 3.5 cm on the current study, which is increased from the comparison of 3.0 cm. Reproductive: Changes of hysterectomy Other: Small fact containing umbilical hernia. Musculoskeletal: Multilevel degenerative changes of the visualized spine. Changes most pronounced spanning L3-S1. Bilateral facet disease worst in the lower lumbar levels. No displaced fracture. Grade 1 anterolisthesis of L4 on L5 is unchanged. IMPRESSION: CT evidence of acute cholecystitis, with gallbladder distention, radiopaque stones/ debris, new intrahepatic bile duct dilation, and trace pericholecystic fluid. If a confirmatory test is needed, HIDA scan would be the next imaging test. The common bile duct is dilated compared to the prior CT scan, measuring as large as 10 mm. Abrupt distal tapering at the ampulla with no radiopaque stone identified. This is nonspecific although could represent new obstruction related to non radiopaque stones, inflammatory stricture, or much less likely neoplasm. Correlation with ERCP and/ or MRCP may be considered. The patient has had infrarenal abdominal aortic aneurysm repair with aortic  endograft, with the last surveillance study 12/30/2014. There has been no significant growth of the excluded aneurysm sac, however, the patient has diffuse aortic disease with increasing diameter of the suprarenal aorta, now measuring 3.5 cm. Continued surveillance is recommended. These results were called by telephone at the time of interpretation on 10/09/2016 at 10:17 am to Dr. Gustavus Messing, who verbally acknowledged these results. Diverticular disease without evidence of acute diverticulitis. Multilevel degenerative changes of the spine including grade 1 anterolisthesis of L4 on L5. Electronically Signed   By: Corrie Mckusick D.O.   On: 10/09/2016 10:19   US Abdomen Limited Ruq  Result Date: 10/09/2016 CLINICAL DATA:  Upper abdominal pain EXAM: US ABDOMEN LIMITED - RIGHT UPPER QUADRANT COMPARISON:  CT abdomen and pelvis December 30, 2014 FINDINGS: Gallbladder: Gallbladder is mildly distended. There are echogenic foci in the gallbladder which move and shadow consistent with gallstones. Largest gallstone measures 1.5 cm in length. There is no gallbladder wall thickening or pericholecystic fluid. There is no appreciable gallbladder wall thickening or pericholecystic fluid. No sonographic Murphy sign noted by sonographer. Common bile duct: Diameter: Distended at 10 mm. No mass or calculus is appreciable in the biliary ductal system. Liver: No focal lesion identified. Liver echogenicity is somewhat inhomogeneous and overall increased. IMPRESSION: Cholelithiasis.  Gallbladder slightly distended. Common bile duct dilatation without mass or calculus seen by ultrasound. Increased liver echogenicity most likely is indicative of hepatic steatosis. While no focal liver lesions are evident on this study, it must be cautioned that the sensitivity of ultrasound for detection of focal liver lesions is diminished in this circumstance. Electronically Signed   By: Lowella Grip III M.D.   On: 10/09/2016 09:57     Anti-infectives: Anti-infectives    None  Assessment/Plan: Symptomatic cholelithiasis - pain resolved. Allow clears. Continue to hold coumadin. Will re-eval in AM. NPO after MN.  LOS: 1 day    Areeb Corron E 10/10/2016

## 2016-10-10 NOTE — Progress Notes (Signed)
Hypoglycemic Event  CBG: 57  Treatment: carb snack  Symptoms: none  Follow-up CBG: Time: 12:54 CBG Result: 156  Possible Reasons for Event: NPO  Comments/MD notified: yes    Lezlie Octave

## 2016-10-11 ENCOUNTER — Observation Stay (HOSPITAL_COMMUNITY): Payer: Medicare HMO

## 2016-10-11 DIAGNOSIS — R109 Unspecified abdominal pain: Secondary | ICD-10-CM | POA: Diagnosis not present

## 2016-10-11 LAB — GLUCOSE, CAPILLARY
GLUCOSE-CAPILLARY: 132 mg/dL — AB (ref 65–99)
GLUCOSE-CAPILLARY: 124 mg/dL — AB (ref 65–99)
GLUCOSE-CAPILLARY: 120 mg/dL — AB (ref 65–99)
Glucose-Capillary: 123 mg/dL — ABNORMAL HIGH (ref 65–99)

## 2016-10-11 LAB — COMPREHENSIVE METABOLIC PANEL
ALT: 14 U/L (ref 14–54)
AST: 22 U/L (ref 15–41)
Albumin: 3.5 g/dL (ref 3.5–5.0)
Alkaline Phosphatase: 30 U/L — ABNORMAL LOW (ref 38–126)
Anion gap: 12 (ref 5–15)
BILIRUBIN TOTAL: 0.6 mg/dL (ref 0.3–1.2)
BUN: 10 mg/dL (ref 6–20)
CALCIUM: 8.6 mg/dL — AB (ref 8.9–10.3)
CO2: 29 mmol/L (ref 22–32)
Chloride: 96 mmol/L — ABNORMAL LOW (ref 101–111)
Creatinine, Ser: 1 mg/dL (ref 0.44–1.00)
GFR, EST AFRICAN AMERICAN: 59 mL/min — AB (ref >60)
GFR, EST NON AFRICAN AMERICAN: 51 mL/min — AB (ref >60)
GLUCOSE: 150 mg/dL — AB (ref 65–99)
POTASSIUM: 3.7 mmol/L (ref 3.5–5.1)
Sodium: 137 mmol/L (ref 135–145)
TOTAL PROTEIN: 7 g/dL (ref 6.5–8.1)

## 2016-10-11 LAB — CBC
HEMATOCRIT: 36.8 % (ref 36.0–46.0)
Hemoglobin: 12.8 g/dL (ref 12.0–15.0)
MCH: 27.1 pg (ref 26.0–34.0)
MCHC: 34.8 g/dL (ref 30.0–36.0)
MCV: 77.8 fL — AB (ref 78.0–100.0)
Platelets: 190 10*3/uL (ref 150–400)
RBC: 4.73 MIL/uL (ref 3.87–5.11)
RDW: 17.3 % — AB (ref 11.5–15.5)
WBC: 3.9 10*3/uL — AB (ref 4.0–10.5)

## 2016-10-11 LAB — PROTIME-INR
INR: 2.18
Prothrombin Time: 24.7 seconds — ABNORMAL HIGH (ref 11.4–15.2)

## 2016-10-11 MED ORDER — TECHNETIUM TC 99M MEBROFENIN IV KIT
5.0000 | PACK | Freq: Once | INTRAVENOUS | Status: AC | PRN
  Administered 2016-10-11: 5 via INTRAVENOUS

## 2016-10-11 NOTE — Progress Notes (Signed)
PROGRESS NOTE  Alejandra [name]  ZOX:[id number] DOB: 1932-04-17 DOA: 10/09/2016 PCP: Mauricio Po, FNP - [place]  Brief Narrative: Alejandra [name] is an 83 y.o. female with a history of AAA s/p endovascular repair 2004, PE 2011 on coumadin, COPD on 2L O2, DM, HTN, and HLD who presented 3/24 with epigastric abdominal pain, nausea and vomiting. Symptoms were significantly improved with pain medications in ED. In the ED she was afebrile, BP 171/89, HR 77. WBC 7.7. Abd U/S showed mild gallbladder distention with echogenic stones without wall thickening or pericholecystic fluid. CBG 66m without ductal system mass/calculus. Subsequent CT abdomen showed similar findings with trace pericholecystic fluid concerning for acute cholecystitis. AAA was also seen s/p endograft repair without significant interval dilatation. CXR showed bibasilar atelectasis without infiltrate or edema. General surgery was consulted, recommending monitoring while holding coumadin (INR 2.2 on arrival).  Assessment & Plan: Principal Problem:   Acute cholecystitis Active Problems:   COPD (chronic obstructive pulmonary disease) ([name])   Dyslipidemia   CKD stage 3 secondary to diabetes ([name])   Anticoagulated on Coumadin   HTN (hypertension), malignant   Chronic respiratory failure with hypoxia ([name])  Symptomatic cholelithiasis: CT with gallbladder distention, radiopaque stones/ debris, new intrahepatic bile duct dilation, and trace pericholecystic fluid. CBD up to 124mfrom 44m544mNo leukocytosis, LFTs wnl.  - General surgery following: Continue NPO. HIDA today. If positive, to OR after INR down. If negative, advance diet.  - Holding coumadin, monitoring INR daily - Pain is subsided, so continuing prns only.  - METS about 4. ECG unchanged RBBB from prior. No anginal symptoms.    HTN: Elevated on admission, now normotensive. - Continue to hold metoprolol '50mg'$  BID and maxide 37.5 - '25mg'$  - Continue lopressor '5mg'$  IV q6hr as this  is providing good control and pt will be NPO again. - Would consider hydralazine IV as needed if becomes uncontrolled.  T2DM: Well-controlled, HbA1c 6.9%.  - Hold metformin - SSI  Chronic anticoagulation with coumadin for hx of prior AAA repair 2004 and PE (dx 01/2010). INR 2.24 on admission, will allow to trend down by holding coumadin. No indication for reversal at this time.  - Holding ASA in preparation for surgery as well  Microcytosis: on labs 3/26 without anemia.  - Check iron panel with next blood draw.   Chronic hypoxemic respiratory failure due to COPD: No exacerbation is evident, stable on home 2L by [place]. CXR with atelectasis only.  - Continue home spiriva, nebulizers - Continue 2L O2 by [place].   GERD: Chronic, stable.  - IV PPI due to NPO  AAA: No significant growth since last surveillance 12/30/2014, though CT showed diffuse aortic disease with increasing diameter of the suprarenal aorta, now measuring 3.5 cm.  - Continued surveillance of suprarenal aorta is also recommended. - Continue statin - Holding ASA for now  DVT prophylaxis: SCD Code Status: Full Family Communication: None at bedside this AM Disposition Plan: NPO currently. HIDA today > dvance diet if negative, await surgery if positive.  Consultants:   General surgery, Drs. KinKieth BrightlyhoCarolynne Edouardrocedures:   None  Antimicrobials:  None   Subjective: Pt reporting lower abdominal pain without N/V. Pain medications helped. Has been NPO since MN.   Objective: Vitals:   10/10/16 2331 10/11/16 0701 10/11/16 0753 10/11/16 0927  BP: (!) 147/79 138/74  (!) 182/93  Pulse: 83 79  80  Resp: '16 16  17  '$ Temp: 98.1 F (36.7 C) 98 F (36.7 C)  98.8  F (37.1 C)  TempSrc: Oral Oral  Oral  SpO2: 99% 98% 97% 96%  Weight: 71.6 kg (157 lb 12.8 oz)     Height:        Intake/Output Summary (Last 24 hours) at 10/11/16 1059 Last data filed at 10/11/16 1000  Gross per 24 hour  Intake              942  ml  Output              375 ml  Net              567 ml   Filed Weights   10/09/16 1420 10/09/16 2046 10/10/16 2331  Weight: 70.2 kg (154 lb 12.8 oz) 71.8 kg (158 lb 4.8 oz) 71.6 kg (157 lb 12.8 oz)    Examination: General exam: Elderly female in no distress. Respiratory system: Non-labored breathing 2L by [place]. Diminished but clear to auscultation bilaterally.  Cardiovascular system: Regular rate and rhythm. No murmur, rub, or gallop. No JVD, and no pedal edema. Gastrointestinal system: Abdomen soft, very mildly tender diffusely without rebound. Non-distended, with normoactive bowel sounds. No organomegaly or masses felt. Central nervous system: Alert and oriented. No focal neurological deficits. Extremities: Warm, no deformities Skin: No rashes, lesions no ulcers Psychiatry: Judgement and insight appear normal. Mood & affect appropriate.   Data Reviewed: I have personally reviewed following labs and imaging studies  CBC:  Recent Labs Lab 10/09/16 0455 10/10/16 0530 10/11/16 0709  WBC 10.2 7.7 3.9*  HGB 12.1 12.8 12.8  HCT 36.0 37.4 36.8  MCV 78.1 78.4 77.8*  PLT 250 222 938   Basic Metabolic Panel:  Recent Labs Lab 10/09/16 0455 10/10/16 0530 10/11/16 0709  NA 140 141 137  K 3.5 3.8 3.7  CL 98* 96* 96*  CO2 30 34* 29  GLUCOSE 224* 131* 150*  BUN '19 14 10  '$ CREATININE 1.05* 1.09* 1.00  CALCIUM 9.2 9.0 8.6*   GFR: Estimated Creatinine Clearance: 43 mL/min (by C-G formula based on SCr of 1 mg/dL). Liver Function Tests:  Recent Labs Lab 10/09/16 0455 10/10/16 0530 10/11/16 0709  AST '25 26 22  '$ ALT '17 16 14  '$ ALKPHOS 32* 31* 30*  BILITOT 0.6 0.8 0.6  PROT 7.5 7.9 7.0  ALBUMIN 3.6 3.7 3.5    Recent Labs Lab 10/09/16 0455  LIPASE 23   No results for input(s): AMMONIA in the last 168 hours. Coagulation Profile:  Recent Labs Lab 10/09/16 1310 10/10/16 0530 10/11/16 0709  INR 2.24 2.19 2.18   Cardiac Enzymes: No results for input(s): CKTOTAL,  CKMB, CKMBINDEX, TROPONINI in the last 168 hours. BNP (last 3 results) No results for input(s): PROBNP in the last 8760 hours. HbA1C:  Recent Labs  10/09/16 1310  HGBA1C 6.9*   CBG:  Recent Labs Lab 10/10/16 1211 10/10/16 1254 10/10/16 1618 10/10/16 2334 10/11/16 0811  GLUCAP 57* 156* 138* 136* 132*   Lipid Profile: No results for input(s): CHOL, HDL, LDLCALC, TRIG, CHOLHDL, LDLDIRECT in the last 72 hours. Thyroid Function Tests:  Recent Labs  10/09/16 1310  TSH 1.896   Anemia Panel: No results for input(s): VITAMINB12, FOLATE, FERRITIN, TIBC, IRON, RETICCTPCT in the last 72 hours. Urine analysis:    Component Value Date/Time   COLORURINE YELLOW 10/09/2016 0501   APPEARANCEUR CLEAR 10/09/2016 0501   LABSPEC 1.021 10/09/2016 0501   PHURINE 5.0 10/09/2016 0501   GLUCOSEU 50 (A) 10/09/2016 0501   HGBUR NEGATIVE 10/09/2016 0501   BILIRUBINUR NEGATIVE  10/09/2016 0501   KETONESUR NEGATIVE 10/09/2016 0501   PROTEINUR 100 (A) 10/09/2016 0501   UROBILINOGEN 0.2 10/29/2011 1414   NITRITE NEGATIVE 10/09/2016 0501   LEUKOCYTESUR LARGE (A) 10/09/2016 0501   Recent Results (from the past 240 hour(s))  Urine culture     Status: Abnormal   Collection Time: 10/09/16  5:33 AM  Result Value Ref Range Status   Specimen Description URINE, RANDOM  Final   Special Requests NONE  Final   Culture (A)  Final    30,000 COLONIES/mL STREPTOCOCCUS AGALACTIAE TESTING AGAINST S. AGALACTIAE NOT ROUTINELY PERFORMED DUE TO PREDICTABILITY OF AMP/PEN/VAN SUSCEPTIBILITY.    Report Status 10/10/2016 FINAL  Final      Radiology Studies: Dg Chest 2 View  Result Date: 10/09/2016 CLINICAL DATA:  Preoperative cardiovascular examination. Chest and epigastric region pain. EXAM: CHEST  2 VIEW COMPARISON:  Chest radiograph October 30, 2011; chest CT April 07, 2015 FINDINGS: There is mild atelectatic change in the lung bases. There is no appreciable edema or consolidation. Heart size and pulmonary  vascularity are normal. There is atherosclerotic calcification in the aorta. No adenopathy. No bone lesions. IMPRESSION: Bibasilar atelectasis. No edema or consolidation. There is aortic atherosclerosis. Electronically Signed   By: Lowella Grip III M.D.   On: 10/09/2016 12:56    Scheduled Meds: . insulin aspart  0-9 Units Subcutaneous TID WC  . metoprolol  5 mg Intravenous Q6H  . ondansetron (ZOFRAN) IV  4 mg Intravenous Once  . pantoprazole (PROTONIX) IV  40 mg Intravenous Q24H  . sodium chloride flush  3 mL Intravenous Q12H  . tiotropium  18 mcg Inhalation Daily   Continuous Infusions:   LOS: 2 days   Time spent: 25 minutes.  Vance Gather, MD [facility] Pager [phone]  If 7PM-7AM, please contact night-coverage [email or web address] Password [facility] 10/11/2016, 10:59 AM

## 2016-10-11 NOTE — Progress Notes (Signed)
  Subjective: Pain this morning in the LEFT LOWer quadrant. Resolved with pain meds. Some nausea as well. INR still >2  Objective: Vital signs in last 24 hours: Temp:  [98 F (36.7 C)-98.8 F (37.1 C)] 98.8 F (37.1 C) (03/26 0927) Pulse Rate:  [79-90] 80 (03/26 0927) Resp:  [16-18] 17 (03/26 0927) BP: (138-182)/(74-93) 182/93 (03/26 0927) SpO2:  [94 %-99 %] 96 % (03/26 0927) Weight:  [71.6 kg (157 lb 12.8 oz)] 71.6 kg (157 lb 12.8 oz) (03/25 2331)    Intake/Output from previous day: 03/25 0701 - 03/26 0700 In: 942 [P.O.:942] Out: 375 [Urine:375] Intake/Output this shift: Total I/O In: 0  Out: 100 [Urine:100]  General appearance: cooperative Resp: clear to auscultation bilaterally GI: soft, NT  Lab Results:   Recent Labs  10/10/16 0530 10/11/16 0709  WBC 7.7 3.9*  HGB 12.8 12.8  HCT 37.4 36.8  PLT 222 190   BMET  Recent Labs  10/10/16 0530 10/11/16 0709  NA 141 137  K 3.8 3.7  CL 96* 96*  CO2 34* 29  GLUCOSE 131* 150*  BUN 14 10  CREATININE 1.09* 1.00  CALCIUM 9.0 8.6*   PT/INR  Recent Labs  10/10/16 0530 10/11/16 0709  LABPROT 24.7* 24.7*  INR 2.19 2.18   ABG No results for input(s): PHART, HCO3 in the last 72 hours.  Invalid input(s): PCO2, PO2  Studies/Results: Dg Chest 2 View  Result Date: 10/09/2016 CLINICAL DATA:  Preoperative cardiovascular examination. Chest and epigastric region pain. EXAM: CHEST  2 VIEW COMPARISON:  Chest radiograph October 30, 2011; chest CT April 07, 2015 FINDINGS: There is mild atelectatic change in the lung bases. There is no appreciable edema or consolidation. Heart size and pulmonary vascularity are normal. There is atherosclerotic calcification in the aorta. No adenopathy. No bone lesions. IMPRESSION: Bibasilar atelectasis. No edema or consolidation. There is aortic atherosclerosis. Electronically Signed   By: Lowella Grip III M.D.   On: 10/09/2016 12:56    Anti-infectives: Anti-infectives    None       Assessment/Plan: Symptomatic cholelithiasis - pain resolved and then changed in location. Will get HIDA. If negative will try advancing diet. If positive will plan OR when INR falls  LOS: 2 days    Clovis Riley 10/11/2016

## 2016-10-12 ENCOUNTER — Inpatient Hospital Stay (HOSPITAL_COMMUNITY): Payer: Medicare HMO

## 2016-10-12 ENCOUNTER — Encounter (HOSPITAL_COMMUNITY): Payer: Self-pay | Admitting: Gastroenterology

## 2016-10-12 DIAGNOSIS — I4891 Unspecified atrial fibrillation: Secondary | ICD-10-CM

## 2016-10-12 DIAGNOSIS — R109 Unspecified abdominal pain: Secondary | ICD-10-CM

## 2016-10-12 DIAGNOSIS — I471 Supraventricular tachycardia: Secondary | ICD-10-CM

## 2016-10-12 LAB — CBC
HEMATOCRIT: 38.3 % (ref 36.0–46.0)
HEMOGLOBIN: 13 g/dL (ref 12.0–15.0)
MCH: 26.3 pg (ref 26.0–34.0)
MCHC: 33.9 g/dL (ref 30.0–36.0)
MCV: 77.5 fL — ABNORMAL LOW (ref 78.0–100.0)
Platelets: 207 10*3/uL (ref 150–400)
RBC: 4.94 MIL/uL (ref 3.87–5.11)
RDW: 16.9 % — ABNORMAL HIGH (ref 11.5–15.5)
WBC: 5.5 10*3/uL (ref 4.0–10.5)

## 2016-10-12 LAB — PROTIME-INR
INR: 1.8
PROTHROMBIN TIME: 21.1 s — AB (ref 11.4–15.2)

## 2016-10-12 LAB — GLUCOSE, CAPILLARY
GLUCOSE-CAPILLARY: 140 mg/dL — AB (ref 65–99)
GLUCOSE-CAPILLARY: 117 mg/dL — AB (ref 65–99)
Glucose-Capillary: 106 mg/dL — ABNORMAL HIGH (ref 65–99)

## 2016-10-12 LAB — HEPARIN LEVEL (UNFRACTIONATED): HEPARIN UNFRACTIONATED: 0.33 [IU]/mL (ref 0.30–0.70)

## 2016-10-12 MED ORDER — DILTIAZEM HCL 100 MG IV SOLR
5.0000 mg/h | INTRAVENOUS | Status: DC
  Filled 2016-10-12: qty 100

## 2016-10-12 MED ORDER — METOPROLOL TARTRATE 5 MG/5ML IV SOLN: 5.0000 mg | Freq: Four times a day (QID) | INTRAVENOUS | Status: DC | PRN

## 2016-10-12 MED ORDER — DEXTROSE-NACL 5-0.45 % IV SOLN: INTRAVENOUS | Status: DC

## 2016-10-12 MED ORDER — DILTIAZEM LOAD VIA INFUSION
10.0000 mg | Freq: Once | INTRAVENOUS | Status: DC
Start: 2016-10-12 — End: 2016-10-12
  Filled 2016-10-12: qty 10

## 2016-10-12 MED ORDER — METOPROLOL TARTRATE 5 MG/5ML IV SOLN
5.0000 mg | Freq: Four times a day (QID) | INTRAVENOUS | Status: DC
  Administered 2016-10-12: 5 mg via INTRAVENOUS

## 2016-10-12 MED ORDER — METOPROLOL TARTRATE 5 MG/5ML IV SOLN
5.0000 mg | INTRAVENOUS | Status: DC
  Administered 2016-10-12 – 2016-10-13 (×3): 5 mg via INTRAVENOUS
  Filled 2016-10-12 (×3): qty 5

## 2016-10-12 MED ORDER — HYDRALAZINE HCL 20 MG/ML IJ SOLN: 5.0000 mg | Freq: Four times a day (QID) | INTRAMUSCULAR | Status: DC

## 2016-10-12 MED ORDER — GADOBENATE DIMEGLUMINE 529 MG/ML IV SOLN
15.0000 mL | Freq: Once | INTRAVENOUS | Status: AC | PRN
  Administered 2016-10-12: 15 mL via INTRAVENOUS

## 2016-10-12 MED ORDER — DEXTROSE-NACL 5-0.45 % IV SOLN
INTRAVENOUS | Status: DC
  Administered 2016-10-13: 100 mL/h via INTRAVENOUS

## 2016-10-12 MED ORDER — METOPROLOL TARTRATE 5 MG/5ML IV SOLN
10.0000 mg | Freq: Four times a day (QID) | INTRAVENOUS | Status: DC
  Filled 2016-10-12: qty 10

## 2016-10-12 MED ORDER — HEPARIN (PORCINE) IN NACL 100-0.45 UNIT/ML-% IJ SOLN
850.0000 [IU]/h | INTRAMUSCULAR | Status: DC
  Administered 2016-10-12 – 2016-10-14 (×2): 850 [IU]/h via INTRAVENOUS
  Filled 2016-10-12 (×2): qty 250

## 2016-10-12 MED ORDER — WHITE PETROLATUM GEL
Status: AC
  Administered 2016-10-12: 12:00:00
  Filled 2016-10-12: qty 1

## 2016-10-12 NOTE — Progress Notes (Addendum)
PROGRESS NOTE  Alejandra [name]  OAC:[id number] DOB: 29-Nov-1931 DOA: 10/09/2016 PCP: Mauricio Po, FNP - [place]  Brief Narrative: Alejandra [name] is an 83 y.o. female with a history of AAA s/p endovascular repair 2004, PE 2011 on coumadin, COPD on 2L O2, DM, HTN, and HLD who presented 3/24 with epigastric abdominal pain, nausea and vomiting. Symptoms were significantly improved with pain medications in ED. In the ED she was afebrile, BP 171/89, HR 77. WBC 7.7. Abd U/S showed mild gallbladder distention with echogenic stones without wall thickening or pericholecystic fluid. CBG 37m without ductal system mass/calculus. Subsequent CT abdomen showed similar findings with trace pericholecystic fluid concerning for acute cholecystitis. AAA was also seen s/p endograft repair without significant interval dilatation. CXR showed bibasilar atelectasis without infiltrate or edema. General surgery was consulted, recommending monitoring while holding coumadin (INR 2.2 on arrival).  Assessment & Plan: Principal Problem:   Acute cholecystitis Active Problems:   COPD (chronic obstructive pulmonary disease) ([name])   Dyslipidemia   CKD stage 3 secondary to diabetes ([name])   Anticoagulated on Coumadin   HTN (hypertension), malignant   Chronic respiratory failure with hypoxia ([name])  Symptomatic cholelithiasis: CT with gallbladder distention, radiopaque stones/ debris, new intrahepatic bile duct dilation, and trace pericholecystic fluid. CBD up to 16mfrom 52m27mNo leukocytosis, LFTs wnl so doubt CBD obstruction. HIDA scan abnormal with nonvisualization of gallbladder (physiologic due to NPO vs. analgesics vs. CBD obstruction).  - MRCP today per GI. NPO after midnight in case of need for lap chole per surgery. - Holding coumadin, monitoring INR daily. - Pain is subsided, so continuing prns only and will monitor on clear liquid diet - METS about 4. ECG unchanged RBBB from prior. No anginal symptoms.    HTN:  Elevated on admission, now normotensive. - Continue to hold metoprolol '50mg'$  BID and maxide 37.5 - '25mg'$  - Increase lopressor to '10mg'$  IV q6hr, given elevated readings and elevated HR, and expected ongoing NPO status.  - Would consider hydralazine IV as needed if becomes uncontrolled.  ADDENDUM: Pt with telemetric evidence of AFib. Not a known diagnosis PTA. HR down to 90's with administration of schedule metoprolol IV. STAT ECG shows unchanged RBBB with sinus rhythm. Asymptomatic. Recent TSH wnl. K wnl yesterday.  - Continue IV metoprolol as above - Heparin bridge while coumadin subtherapeutic (holding for possible surgery) - Echocardiogram ordered - Will have cardiology consult for new AFib and pre-operative clearance.   T2DM: Well-controlled, HbA1c 6.9%.  - Hold metformin - SSI  Chronic anticoagulation with coumadin for hx of prior AAA repair 2004 and PE (dx 01/2010). INR 2.24 on admission, will allow to trend down by holding coumadin. No indication for reversal at this time.  - Holding ASA in preparation for surgery as well - Heparin bridging  Microcytosis: on labs 3/26 without anemia.  - Check iron panel with next blood draw.   Chronic hypoxemic respiratory failure due to COPD: No exacerbation is evident, stable on home 2L by [place]. CXR with atelectasis only.  - Continue home spiriva, nebulizers - Continue 2L O2 by [place].   GERD: Chronic, stable.  - IV PPI due to NPO  AAA: No significant growth since last surveillance 12/30/2014, though CT showed diffuse aortic disease with increasing diameter of the suprarenal aorta, now measuring 3.5 cm.  - Continued surveillance of suprarenal aorta is also recommended. - Continue statin - Holding ASA for now  DVT prophylaxis: SCD Code Status: Full Family Communication: None at bedside this AM Disposition  Plan: MRCP today > NPO after MN for consideration of lap chole 3/28.    Consultants:   General surgery, Drs. Kieth Brightly, [facility], Dr. Watt Climes  Procedures:   None  Antimicrobials:  None   Subjective: Pt reporting lower abdominal pain without N/V. Eager to eat today. No dyspnea, chest pain.   Objective: Vitals:   10/11/16 2356 10/12/16 0537 10/12/16 0830 10/12/16 1000  BP: (!) 179/90 (!) 152/90  (!) 158/95  Pulse: 74 95  92  Resp: 18 18    Temp: 98.5 F (36.9 C) 98.3 F (36.8 C)  98.4 F (36.9 C)  TempSrc: Oral Oral  Oral  SpO2: 100% 98% 97% 92%  Weight: 68.5 kg (151 lb 1.6 oz)     Height:        Intake/Output Summary (Last 24 hours) at 10/12/16 1145 Last data filed at 10/12/16 1000  Gross per 24 hour  Intake                0 ml  Output              701 ml  Net             -701 ml   Filed Weights   10/09/16 2046 10/10/16 2331 10/11/16 2356  Weight: 71.8 kg (158 lb 4.8 oz) 71.6 kg (157 lb 12.8 oz) 68.5 kg (151 lb 1.6 oz)    Examination: General exam: Elderly female in no distress. Respiratory system: Non-labored breathing 2L by [place]. Diminished but clear to auscultation bilaterally.  Cardiovascular system: Regular rate and rhythm. No murmur, rub, or gallop. No JVD, and no pedal edema. Gastrointestinal system: Abdomen soft, mild lower abdominal tenderness without rebound. Non-distended, with normoactive bowel sounds. No organomegaly or masses felt. Central nervous system: Alert and oriented. No focal neurological deficits. Extremities: Warm, no deformities Skin: No rashes, lesions no ulcers Psychiatry: Judgement and insight appear normal. Mood & affect appropriate.   Data Reviewed: I have personally reviewed following labs and imaging studies  CBC:  Recent Labs Lab 10/09/16 0455 10/10/16 0530 10/11/16 0709 10/12/16 0608  WBC 10.2 7.7 3.9* 5.5  HGB 12.1 12.8 12.8 13.0  HCT 36.0 37.4 36.8 38.3  MCV 78.1 78.4 77.8* 77.5*  PLT 250 222 190 496   Basic Metabolic Panel:  Recent Labs Lab 10/09/16 0455 10/10/16 0530 10/11/16 0709  NA 140 141 137  K 3.5 3.8 3.7  CL  98* 96* 96*  CO2 30 34* 29  GLUCOSE 224* 131* 150*  BUN '19 14 10  '$ CREATININE 1.05* 1.09* 1.00  CALCIUM 9.2 9.0 8.6*   GFR: Estimated Creatinine Clearance: 42.2 mL/min (by C-G formula based on SCr of 1 mg/dL). Liver Function Tests:  Recent Labs Lab 10/09/16 0455 10/10/16 0530 10/11/16 0709  AST '25 26 22  '$ ALT '17 16 14  '$ ALKPHOS 32* 31* 30*  BILITOT 0.6 0.8 0.6  PROT 7.5 7.9 7.0  ALBUMIN 3.6 3.7 3.5    Recent Labs Lab 10/09/16 0455  LIPASE 23   No results for input(s): AMMONIA in the last 168 hours. Coagulation Profile:  Recent Labs Lab 10/09/16 1310 10/10/16 0530 10/11/16 0709 10/12/16 0608  INR 2.24 2.19 2.18 1.80   Cardiac Enzymes: No results for input(s): CKTOTAL, CKMB, CKMBINDEX, TROPONINI in the last 168 hours. BNP (last 3 results) No results for input(s): PROBNP in the last 8760 hours. HbA1C:  Recent Labs  10/09/16 1310  HGBA1C 6.9*   CBG:  Recent Labs Lab 10/11/16  8295 10/11/16 1135 10/11/16 1801 10/12/16 0000 10/12/16 0746  GLUCAP 132* 124* 120* 123* 140*   Lipid Profile: No results for input(s): CHOL, HDL, LDLCALC, TRIG, CHOLHDL, LDLDIRECT in the last 72 hours. Thyroid Function Tests:  Recent Labs  10/09/16 1310  TSH 1.896   Anemia Panel: No results for input(s): VITAMINB12, FOLATE, FERRITIN, TIBC, IRON, RETICCTPCT in the last 72 hours. Urine analysis:    Component Value Date/Time   COLORURINE YELLOW 10/09/2016 0501   APPEARANCEUR CLEAR 10/09/2016 0501   LABSPEC 1.021 10/09/2016 0501   PHURINE 5.0 10/09/2016 0501   GLUCOSEU 50 (A) 10/09/2016 0501   HGBUR NEGATIVE 10/09/2016 0501   BILIRUBINUR NEGATIVE 10/09/2016 0501   KETONESUR NEGATIVE 10/09/2016 0501   PROTEINUR 100 (A) 10/09/2016 0501   UROBILINOGEN 0.2 10/29/2011 1414   NITRITE NEGATIVE 10/09/2016 0501   LEUKOCYTESUR LARGE (A) 10/09/2016 0501   Recent Results (from the past 240 hour(s))  Urine culture     Status: Abnormal   Collection Time: 10/09/16  5:33 AM   Result Value Ref Range Status   Specimen Description URINE, RANDOM  Final   Special Requests NONE  Final   Culture (A)  Final    30,000 COLONIES/mL STREPTOCOCCUS AGALACTIAE TESTING AGAINST S. AGALACTIAE NOT ROUTINELY PERFORMED DUE TO PREDICTABILITY OF AMP/PEN/VAN SUSCEPTIBILITY.    Report Status 10/10/2016 FINAL  Final      Radiology Studies: Nm Hepatobiliary Liver Func  Result Date: 10/11/2016 CLINICAL DATA:  Nausea, vomiting, and abdominal pain since 10/08/2016, suspected cholecystitis by CT EXAM: NUCLEAR MEDICINE HEPATOBILIARY IMAGING TECHNIQUE: Sequential images of the abdomen were obtained out to 60 minutes following intravenous administration of radiopharmaceutical. RADIOPHARMACEUTICALS:  5.29 mCi Tc-18mCholetec IV COMPARISON:  CT abdomen and pelvis 10/09/2016 FINDINGS: Normal tracer extraction from bloodstream indicating normal hepatocellular function. Prompt concentration and excretion of tracer into the central biliary tree. Gallbladder visualized at 40 minutes. At 1 hour, bowel had not visualized. Additional imaging over on additional power showed no small bowel visualization. This can be seen as a physiologic finding in patients who are NPO without stimulus for gallbladder contraction or relaxation of the sphincter of Oddi, in patients receiving narcotics, and with CBD obstruction. IMPRESSION: Patent cystic duct. Nonvisualization of the gallbladder at 2 hours, which can be physiologic, due to prior narcotic administration, or secondary to CBD obstruction. Electronically Signed   By: MLavonia DanaM.D.   On: 10/11/2016 18:24    Scheduled Meds: . insulin aspart  0-9 Units Subcutaneous TID WC  . metoprolol  5 mg Intravenous Q6H  . ondansetron (ZOFRAN) IV  4 mg Intravenous Once  . pantoprazole (PROTONIX) IV  40 mg Intravenous Q24H  . sodium chloride flush  3 mL Intravenous Q12H  . tiotropium  18 mcg Inhalation Daily  . white petrolatum       Continuous Infusions:   LOS: 2 days    Time spent: 25 minutes.  RVance Gather MD [facility] Pager [phone] If 7PM-7AM, please contact night-coverage [email or web address] Password [facility]3/27/2018, 11:45 AM

## 2016-10-12 NOTE — Consult Note (Signed)
Cardiology Consult    Patient ID: Alejandra [name] MRN: [id number], DOB/AGE: October 01, 1931   Admit date: 10/09/2016 Date of Consult: 10/12/2016  Primary Physician: Mauricio Po, [place] Reason for Consult: Pre-operative evaluation and new atrial fibrillation Primary Cardiologist: New Requesting Provider: Dr. Bonner Puna  History of Present Illness    Alejandra [name] is a 83 y.o. with past medical history significant for AAA s/p endovascular repair 2004, PE 2011 on coumadin, COPD on 2L O2, DM, HTN, and HLD who presented 3/24 with epigastric abdominal pain, nausea and vomiting. She has symptomatic cholelithiasis and GI is recommending possible lap cholecystectomy.   The patient has developed atrial fibrillation with no previous history of. Her Oral metoprolol is on hold while NPO and she has been receiving metoprolol mg IV q 6h which was just increased to 10 mg. She was in atrial fib with RVR with rates up to the 150's-170. She hd no awareness of this, no palpitations, shortness of breath, lightheadedness. She is currently back in sinus rhythm with intermittent bursts of atrial fib. Upon exam, she is lying flat in the bed on her side without breathing difficulty. She is currently having abdominal pain. Prior to this admission for abdominal pain she has hd no exertional symptoms, no increase in shortness of breath (she uses oxygen at home for COPD) and no chest pain. She is unaware of any previous episodes of atrial fib or irregular heart beats, no MI or no previous associations with a cardiologist. She was a long time smoker from age 27 to about age 70. She does not drink alcohol. Her mother and 3 or 4 of her sisters died of a heart attack. She does not know of any family history of atrial fib.   K+ 3.7,  SCr 1.00, LFT's are normal except low alk phos.   Hgb 13.0   INR 1.8   TSH 1.896   BNP  79.4  Past Medical History   Past Medical History:  Diagnosis Date  . AAA (abdominal aortic aneurysm) ([place])     . COPD (chronic obstructive pulmonary disease) ([place])   . DM (diabetes mellitus) ([place])   . Dyslipidemia   . Hyperlipidemia   . Hypertension   . Left foot infection 2013  . 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 2013  . Pulmonary embolism [facility])     Past Surgical History:  Procedure Laterality Date  . ABDOMINAL AORTIC ANEURYSM REPAIR  10-30-2011  . ENDOVASCULAR STENT INSERTION  10/30/2011   Procedure: ENDOVASCULAR STENT GRAFT INSERTION;  Surgeon: Serafina Mitchell, MD;  Location: [facility] OR;  Service: Vascular;  Laterality: N/A;  . VESICOVAGINAL FISTULA CLOSURE W/ TAH       Allergies  Allergies  Allergen Reactions  . Penicillins Nausea And Vomiting, Rash and Other (See Comments)    Childhood reaction    Inpatient Medications    . insulin aspart  0-9 Units Subcutaneous TID WC  . metoprolol  10 mg Intravenous Q6H  . ondansetron (ZOFRAN) IV  4 mg Intravenous Once  . pantoprazole (PROTONIX) IV  40 mg Intravenous Q24H  . sodium chloride flush  3 mL Intravenous Q12H  . tiotropium  18 mcg Inhalation Daily    Family History    Family History  Problem Relation Age of Onset  . Heart disease Mother   . Hypertension Mother   . Heart attack Mother   . Hypertension Father   . Diabetes Father   . Heart attack Father   . Heart disease Sister  x 3  . Hypertension Sister   . Heart attack Sister   . Clotting disorder Daughter   . Clotting disorder Son     multiple sons  . Diabetes Son   . Hypertension Brother     Social History    Social History   Social History  . Marital status: Widowed    Spouse name: N/A  . Number of children: 2  . Years of education: 12   Occupational History  . retired     Building surveyor   Social History Main Topics  . Smoking status: Former Smoker    Packs/day: 0.50    Years: 54.00    Types: Cigarettes    Quit date: 01/23/2010  . Smokeless tobacco: Never Used  . Alcohol use No  . Drug use: No  . Sexual activity: Not on file   Other Topics Concern  . Not on  file   Social History Narrative   Fun: Goes to church, Watch TV   Denies religious beliefs effecting health care.      Review of Systems   General:  No chills, fever, night sweats or weight changes.  Cardiovascular:  No chest pain, dyspnea on exertion, edema, orthopnea, palpitations, paroxysmal nocturnal dyspnea. Dermatological: No rash, lesions/masses Respiratory: No cough, dyspnea Urologic: No hematuria, dysuria Abdominal:   No nausea, vomiting, diarrhea, bright red blood per rectum, melena, or hematemesis Neurologic:  No visual changes, wkns, changes in mental status. All other systems reviewed and are otherwise negative except as noted above.  Physical Exam   Blood pressure 110/67, pulse 92, temperature 98.4 F (36.9 C), temperature source Oral, resp. rate 18, height _0  (1.651 m), weight 151 lb 1.6 oz (68.5 kg), SpO2 92 %.  General: Pleasant, NAD Psych: Normal affect. Neuro: Alert and oriented X 3. Moves all extremities spontaneously. HEENT: Normal  Neck: Supple without bruits or JVD. Lungs:  Resp regular and unlabored, CTA except scattered rhonchi Heart: RRR no s3, s4, or murmurs. Abdomen: Soft, non-tender, non-distended, BS + x 4.  Extremities: No clubbing, cyanosis or edema. DP/PT/Radials 2+ and equal bilaterally.  Labs    Troponin (Point of Care Test) No results for input(s): TROPIPOC in the last 72 hours. No results for input(s): CKTOTAL, CKMB, TROPONINI in the last 72 hours. Lab Results  Component Value Date   WBC 5.5 10/12/2016   HGB 13.0 10/12/2016   HCT 38.3 10/12/2016   MCV 77.5 (L) 10/12/2016   PLT 207 10/12/2016     Recent Labs Lab 10/11/16 0709  NA 137  K 3.7  CL 96*  CO2 29  BUN 10  CREATININE 1.00  CALCIUM 8.6*  PROT 7.0  BILITOT 0.6  ALKPHOS 30*  ALT 14  AST 22  GLUCOSE 150*   Lab Results  Component Value Date   CHOL 162 07/07/2015   HDL 56.70 07/07/2015   LDLCALC 77 07/07/2015   TRIG 141.0 07/07/2015   Lab Results    Component Value Date   DDIMER 0.78 (H) 02/25/2012     Radiology Studies    Dg Chest 2 View  Result Date: 10/09/2016 CLINICAL DATA:  Preoperative cardiovascular examination. Chest and epigastric region pain. EXAM: CHEST  2 VIEW COMPARISON:  Chest radiograph October 30, 2011; chest CT April 07, 2015 FINDINGS: There is mild atelectatic change in the lung bases. There is no appreciable edema or consolidation. Heart size and pulmonary vascularity are normal. There is atherosclerotic calcification in the aorta. No adenopathy. No bone lesions. IMPRESSION: Bibasilar  atelectasis. No edema or consolidation. There is aortic atherosclerosis. Electronically Signed   By: Lowella Grip III M.D.   On: 10/09/2016 12:56   Nm Hepatobiliary Liver Func  Result Date: 10/11/2016 CLINICAL DATA:  Nausea, vomiting, and abdominal pain since 10/08/2016, suspected cholecystitis by CT EXAM: NUCLEAR MEDICINE HEPATOBILIARY IMAGING TECHNIQUE: Sequential images of the abdomen were obtained out to 60 minutes following intravenous administration of radiopharmaceutical. RADIOPHARMACEUTICALS:  5.29 mCi Tc-45mCholetec IV COMPARISON:  CT abdomen and pelvis 10/09/2016 FINDINGS: Normal tracer extraction from bloodstream indicating normal hepatocellular function. Prompt concentration and excretion of tracer into the central biliary tree. Gallbladder visualized at 40 minutes. At 1 hour, bowel had not visualized. Additional imaging over on additional power showed no small bowel visualization. This can be seen as a physiologic finding in patients who are NPO without stimulus for gallbladder contraction or relaxation of the sphincter of Oddi, in patients receiving narcotics, and with CBD obstruction. IMPRESSION: Patent cystic duct. Nonvisualization of the gallbladder at 2 hours, which can be physiologic, due to prior narcotic administration, or secondary to CBD obstruction. Electronically Signed   By: MLavonia DanaM.D.   On: 10/11/2016 18:24    Ct Abdomen Pelvis W Contrast  Result Date: 10/09/2016 CLINICAL DATA:  83year old female with a history of right upper quadrant pain with nausea and vomiting EXAM: CT ABDOMEN AND PELVIS WITH CONTRAST TECHNIQUE: Multidetector CT imaging of the abdomen and pelvis was performed using the standard protocol following bolus administration of intravenous contrast. CONTRAST:  80 cc Isovue-300 COMPARISON:  Ultrasound 10/09/2016 FINDINGS: Lower chest: Mixed linear/ nodular opacities at the lung bases compatible of atelectasis/ scarring. Nodule of the right middle lobe (image 2 series 4) is unchanged dating to the CT 12/30/2014, most likely benign. Hepatobiliary: Compare to prior there is mild dilation of the central bile ducts on the right an the left. With the common bile duct measuring as great is 10 mm, increased from the prior. The duct is dilated to the insertion to the duodenum. No radiopaque stone identified. Rapid tapering distally at the ampulla. Distention of the gallbladder with dependent radiopaque debris/stones. Mild gallbladder wall thickening and pericholecystic fluid. Pancreas: Unremarkable. No pancreatic ductal dilatation or surrounding inflammatory changes. Spleen: Normal in size without focal abnormality. Adrenals/Urinary Tract: Unremarkable appearance of the right adrenal gland. Unremarkable appearance of the left adrenal gland. Bilateral kidneys without hydronephrosis or nephrolithiasis. Unremarkable course the bilateral ureters. No perinephric stranding or inflammatory changes. Symmetric perfusion. Stomach/Bowel: Unremarkable stomach and small bowel. No abnormally distended small bowel or colon. Colonic diverticular disease. No associated inflammatory changes. Normal appendix. Vascular/Lymphatic: Mixed calcified and soft plaque of the lower thoracic aorta and abdominal aorta. The patient has had endovascular repair of infrarenal abdominal aortic aneurysm with endograft. The proximal margin of the  graft terminates just below the renal arteries, without evidence of migration. The excluded aneurysm sac has similar diameter on axial images to the comparison CT, currently measuring 5.8 cm x 5.5 cm on image 44. Previous measurements at this location 5.8 cm x 5.6 cm. No periaortic fluid or inflammatory changes. Bilateral limbs are patent. Moderate to advanced bilateral iliac system atherosclerotic changes, though remain patent. Proximal femoral vasculature remain patent bilaterally. Bilateral hypogastric arteries patent. This study is not tailored for the most sensitive evaluation of a type 2 endoleak. Stenosis at the origin of the celiac artery, potentially secondary to atherosclerotic changes and/or overlying diaphragmatic crus. No stenosis at the origin of the superior mesenteric artery. Atherosclerotic changes at the origin  the bilateral renal arteries, more pronounced on the left. Suprarenal aorta measures as great as 3.5 cm on the current study, which is increased from the comparison of 3.0 cm. Reproductive: Changes of hysterectomy Other: Small fact containing umbilical hernia. Musculoskeletal: Multilevel degenerative changes of the visualized spine. Changes most pronounced spanning L3-S1. Bilateral facet disease worst in the lower lumbar levels. No displaced fracture. Grade 1 anterolisthesis of L4 on L5 is unchanged. IMPRESSION: CT evidence of acute cholecystitis, with gallbladder distention, radiopaque stones/ debris, new intrahepatic bile duct dilation, and trace pericholecystic fluid. If a confirmatory test is needed, HIDA scan would be the next imaging test. The common bile duct is dilated compared to the prior CT scan, measuring as large as 10 mm. Abrupt distal tapering at the ampulla with no radiopaque stone identified. This is nonspecific although could represent new obstruction related to non radiopaque stones, inflammatory stricture, or much less likely neoplasm. Correlation with ERCP and/ or MRCP  may be considered. The patient has had infrarenal abdominal aortic aneurysm repair with aortic endograft, with the last surveillance study 12/30/2014. There has been no significant growth of the excluded aneurysm sac, however, the patient has diffuse aortic disease with increasing diameter of the suprarenal aorta, now measuring 3.5 cm. Continued surveillance is recommended. These results were called by telephone at the time of interpretation on 10/09/2016 at 10:17 am to Dr. Gustavus Messing, who verbally acknowledged these results. Diverticular disease without evidence of acute diverticulitis. Multilevel degenerative changes of the spine including grade 1 anterolisthesis of L4 on L5. Electronically Signed   By: Corrie Mckusick D.O.   On: 10/09/2016 10:19   US Abdomen Limited Ruq  Result Date: 10/09/2016 CLINICAL DATA:  Upper abdominal pain EXAM: US ABDOMEN LIMITED - RIGHT UPPER QUADRANT COMPARISON:  CT abdomen and pelvis December 30, 2014 FINDINGS: Gallbladder: Gallbladder is mildly distended. There are echogenic foci in the gallbladder which move and shadow consistent with gallstones. Largest gallstone measures 1.5 cm in length. There is no gallbladder wall thickening or pericholecystic fluid. There is no appreciable gallbladder wall thickening or pericholecystic fluid. No sonographic Murphy sign noted by sonographer. Common bile duct: Diameter: Distended at 10 mm. No mass or calculus is appreciable in the biliary ductal system. Liver: No focal lesion identified. Liver echogenicity is somewhat inhomogeneous and overall increased. IMPRESSION: Cholelithiasis.  Gallbladder slightly distended. Common bile duct dilatation without mass or calculus seen by ultrasound. Increased liver echogenicity most likely is indicative of hepatic steatosis. While no focal liver lesions are evident on this study, it must be cautioned that the sensitivity of ultrasound for detection of focal liver lesions is diminished in this circumstance.  Electronically Signed   By: Lowella Grip III M.D.   On: 10/09/2016 09:57    EKG & Cardiac Imaging   EKG: Sinus rhythm 89 bpm, Left axis deviation, Right bundle branch block- similar to EKG in 10/2011  Tele showed atrial fib with RVR 140's-170 beginning at 1205 lasting til 1330 and now with intermittent bursts.   Echocardiogram: Pending  Assessment & Plan    Atrial fibrillation -New onset on tele today, prior EKG showed NSR. Pt is asymptomatic and likely pain related.  -No previous MI or cardiac events. Has CVD risk factors of HTN, HLD, DM, age -Will check echocardiogram for LV function, wall motion and valve status. -This patients CHA2DS2-VASc Score is at least 6 (HTN, DM, Age (2), female, vascular). She is already on long term anticoagulation with coumadin for history of PE and has no  abnormal bleeding. Currently on hold for possible surgery with heparin drip. -Atrial fib can be managed perioperatively. Lap chole would be acceptable procedure if needed. Keep pt on telemetry post op. Continue metoprolol 5 mg IV q 6h. . Will start diltiazem drip and titrate as needed for continued afib with RVR.  Cardiologist to address for final input.   AAA s/p endovascular repair 2004 -AAA s/p endograft repair was seen on CT chest/abd without significant interval dilatation  Chronic anticoagulation -Hx PE in 2011, on coumadin which is currently on hold for possible surgery. IV Heparin per pharmacy protocol.  Cholelithiasis -Being managed by GI and surgery.  HTN -Home meds include metoprolol 50 mg bid, Maxzide 37.5/25 mg daily -Oral meds on hold while NPO. Metoprolol being given IV, dose increased from 5 mg to 10 mg to control a fib. Hydralazine is available for prn use. BP is fluctuation. Continue to monitor.   HLD -Last lipid panel in EPIC 06/2015: LDL 77, Trig 141 -Managed with lovastatin and Lovaza  Type 2 DM -Hemoglobin A1c 6.9 (10/09/2016) -Glipizide and metformin- off oral meds here  and managed with SSI  Signed, Daune Perch, NP-C 10/12/2016, 4:44 PM Pager: [phone]   I have seen, examined and evaluated the patient this PM along with Ms. Phylliss Bob, NP-C.  After reviewing all the available data and chart, we discussed the patients laboratory, study & physical findings as well as symptoms in detail. I agree with her findings, examination as well as impression recommendations as per our discussion.    Very pleasant elderly woman with a history of AAA repair and distant history of PE on home oxygen for COPD with multiple cardiac risk factors who was admitted for symptomatic cholelithiasis. We were consulted for preoperative evaluation because of new onset episodes of tachycardia that on telemetry appeared to be mixed atrial fibrillation along with multifocal atrial tachycardia and atrial flutter (this is based on review with electrophysiology M.D.)  The patient is 100% asymptomatic with these heart rate bursts and irregular heartbeats. She is resting, with a heart are. On her 60 bpm with no symptoms.  I suspect that these tachycardia spells are triggered by pain from her colon lithiasis, but cannot be sure. She is also probably dehydrated which helped to trigger tachycardia. I am concerned with high-dose beta blockers in a patient who is somewhat tachycardic and potentially borderline septic. I recommended reducing the beta blocker dose back to 5 mg every 6 hours standing with additional 5 mg every 6 hours when necessary. But would also then use diltiazem drip for better titratable rate control.  Thankfully, she is already on anticoagulation for her history of PE, and this would simply be carried for post op once stable. Currently on IV heparin while off of her warfarin, and therefore is protected from a CVA or flexes standpoint. She is already on a beta blocker at home which will simply be continued on discharge.  As her preoperative risk evaluation, she is an elderly  woman with oxygen-dependent COPD who is likely do worse without surgery then with. She is 100% asymptomatic from a cardiac standpoint without any angina, heart failure or arrhythmia sensations. She does have this rapid arrhythmia which is being treated with beta blocker. Totally asymptomatic.  At this point I don't think any additional cardiac evaluation is warranted in an asymptomatic patient prior to proceeding with a totally urgent surgery.  While echocardiogram would be helpful, it would not in any way affect our record recommendations.  We will follow along and assist as necessary.   Glenetta Hew, M.D., M.S. Interventional Cardiologist   Pager # [phone] Phone # [phone] [address] [place], [place] [address]

## 2016-10-12 NOTE — Progress Notes (Signed)
Notfied MD Bonner Puna of pt's BP 98/68 and HR reaching up to 152-170 bpm.   Will continue to monitor.   Paulla Fore, RN

## 2016-10-12 NOTE — Progress Notes (Signed)
Cardiology Consult    Patient ID: Alejandra [name] MRN: [id number], DOB/AGE: October 01, 1931   Admit date: 10/09/2016 Date of Consult: 10/12/2016  Primary Physician: Mauricio Po, [place] Reason for Consult: Pre-operative evaluation and new atrial fibrillation Primary Cardiologist: New Requesting Provider: Dr. Bonner Puna  History of Present Illness    Alejandra [name] is a 83 y.o. with past medical history significant for AAA s/p endovascular repair 2004, PE 2011 on coumadin, COPD on 2L O2, DM, HTN, and HLD who presented 3/24 with epigastric abdominal pain, nausea and vomiting. She has symptomatic cholelithiasis and GI is recommending possible lap cholecystectomy.   The patient has developed atrial fibrillation with no previous history of. Her Oral metoprolol is on hold while NPO and she has been receiving metoprolol mg IV q 6h which was just increased to 10 mg. She was in atrial fib with RVR with rates up to the 150's-170. She hd no awareness of this, no palpitations, shortness of breath, lightheadedness. She is currently back in sinus rhythm with intermittent bursts of atrial fib. Upon exam, she is lying flat in the bed on her side without breathing difficulty. She is currently having abdominal pain. Prior to this admission for abdominal pain she has hd no exertional symptoms, no increase in shortness of breath (she uses oxygen at home for COPD) and no chest pain. She is unaware of any previous episodes of atrial fib or irregular heart beats, no MI or no previous associations with a cardiologist. She was a long time smoker from age 27 to about age 70. She does not drink alcohol. Her mother and 3 or 4 of her sisters died of a heart attack. She does not know of any family history of atrial fib.   K+ 3.7,  SCr 1.00, LFT's are normal except low alk phos.   Hgb 13.0   INR 1.8   TSH 1.896   BNP  79.4  Past Medical History   Past Medical History:  Diagnosis Date  . AAA (abdominal aortic aneurysm) ([place])     . COPD (chronic obstructive pulmonary disease) ([place])   . DM (diabetes mellitus) ([place])   . Dyslipidemia   . Hyperlipidemia   . Hypertension   . Left foot infection 2013  . 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 2013  . Pulmonary embolism [facility])     Past Surgical History:  Procedure Laterality Date  . ABDOMINAL AORTIC ANEURYSM REPAIR  10-30-2011  . ENDOVASCULAR STENT INSERTION  10/30/2011   Procedure: ENDOVASCULAR STENT GRAFT INSERTION;  Surgeon: Serafina Mitchell, MD;  Location: [facility] OR;  Service: Vascular;  Laterality: N/A;  . VESICOVAGINAL FISTULA CLOSURE W/ TAH       Allergies  Allergies  Allergen Reactions  . Penicillins Nausea And Vomiting, Rash and Other (See Comments)    Childhood reaction    Inpatient Medications    . insulin aspart  0-9 Units Subcutaneous TID WC  . metoprolol  10 mg Intravenous Q6H  . ondansetron (ZOFRAN) IV  4 mg Intravenous Once  . pantoprazole (PROTONIX) IV  40 mg Intravenous Q24H  . sodium chloride flush  3 mL Intravenous Q12H  . tiotropium  18 mcg Inhalation Daily    Family History    Family History  Problem Relation Age of Onset  . Heart disease Mother   . Hypertension Mother   . Heart attack Mother   . Hypertension Father   . Diabetes Father   . Heart attack Father   . Heart disease Sister  x 3  . Hypertension Sister   . Heart attack Sister   . Clotting disorder Daughter   . Clotting disorder Son     multiple sons  . Diabetes Son   . Hypertension Brother     Social History    Social History   Social History  . Marital status: Widowed    Spouse name: N/A  . Number of children: 2  . Years of education: 12   Occupational History  . retired     Building surveyor   Social History Main Topics  . Smoking status: Former Smoker    Packs/day: 0.50    Years: 54.00    Types: Cigarettes    Quit date: 01/23/2010  . Smokeless tobacco: Never Used  . Alcohol use No  . Drug use: No  . Sexual activity: Not on file   Other Topics Concern  . Not on  file   Social History Narrative   Fun: Goes to church, Watch TV   Denies religious beliefs effecting health care.      Review of Systems   General:  No chills, fever, night sweats or weight changes.  Cardiovascular:  No chest pain, dyspnea on exertion, edema, orthopnea, palpitations, paroxysmal nocturnal dyspnea. Dermatological: No rash, lesions/masses Respiratory: No cough, dyspnea Urologic: No hematuria, dysuria Abdominal:   No nausea, vomiting, diarrhea, bright red blood per rectum, melena, or hematemesis Neurologic:  No visual changes, wkns, changes in mental status. All other systems reviewed and are otherwise negative except as noted above.  Physical Exam   Blood pressure 110/67, pulse 92, temperature 98.4 F (36.9 C), temperature source Oral, resp. rate 18, height _0  (1.651 m), weight 151 lb 1.6 oz (68.5 kg), SpO2 92 %.  General: Pleasant, NAD Psych: Normal affect. Neuro: Alert and oriented X 3. Moves all extremities spontaneously. HEENT: Normal  Neck: Supple without bruits or JVD. Lungs:  Resp regular and unlabored, CTA except scattered rhonchi Heart: RRR no s3, s4, or murmurs. Abdomen: Soft, non-tender, non-distended, BS + x 4.  Extremities: No clubbing, cyanosis or edema. DP/PT/Radials 2+ and equal bilaterally.  Labs    Troponin (Point of Care Test) No results for input(s): TROPIPOC in the last 72 hours. No results for input(s): CKTOTAL, CKMB, TROPONINI in the last 72 hours. Lab Results  Component Value Date   WBC 5.5 10/12/2016   HGB 13.0 10/12/2016   HCT 38.3 10/12/2016   MCV 77.5 (L) 10/12/2016   PLT 207 10/12/2016     Recent Labs Lab 10/11/16 0709  NA 137  K 3.7  CL 96*  CO2 29  BUN 10  CREATININE 1.00  CALCIUM 8.6*  PROT 7.0  BILITOT 0.6  ALKPHOS 30*  ALT 14  AST 22  GLUCOSE 150*   Lab Results  Component Value Date   CHOL 162 07/07/2015   HDL 56.70 07/07/2015   LDLCALC 77 07/07/2015   TRIG 141.0 07/07/2015   Lab Results    Component Value Date   DDIMER 0.78 (H) 02/25/2012     Radiology Studies    Dg Chest 2 View  Result Date: 10/09/2016 CLINICAL DATA:  Preoperative cardiovascular examination. Chest and epigastric region pain. EXAM: CHEST  2 VIEW COMPARISON:  Chest radiograph October 30, 2011; chest CT April 07, 2015 FINDINGS: There is mild atelectatic change in the lung bases. There is no appreciable edema or consolidation. Heart size and pulmonary vascularity are normal. There is atherosclerotic calcification in the aorta. No adenopathy. No bone lesions. IMPRESSION: Bibasilar  atelectasis. No edema or consolidation. There is aortic atherosclerosis. Electronically Signed   By: Lowella Grip III M.D.   On: 10/09/2016 12:56   Nm Hepatobiliary Liver Func  Result Date: 10/11/2016 CLINICAL DATA:  Nausea, vomiting, and abdominal pain since 10/08/2016, suspected cholecystitis by CT EXAM: NUCLEAR MEDICINE HEPATOBILIARY IMAGING TECHNIQUE: Sequential images of the abdomen were obtained out to 60 minutes following intravenous administration of radiopharmaceutical. RADIOPHARMACEUTICALS:  5.29 mCi Tc-45mCholetec IV COMPARISON:  CT abdomen and pelvis 10/09/2016 FINDINGS: Normal tracer extraction from bloodstream indicating normal hepatocellular function. Prompt concentration and excretion of tracer into the central biliary tree. Gallbladder visualized at 40 minutes. At 1 hour, bowel had not visualized. Additional imaging over on additional power showed no small bowel visualization. This can be seen as a physiologic finding in patients who are NPO without stimulus for gallbladder contraction or relaxation of the sphincter of Oddi, in patients receiving narcotics, and with CBD obstruction. IMPRESSION: Patent cystic duct. Nonvisualization of the gallbladder at 2 hours, which can be physiologic, due to prior narcotic administration, or secondary to CBD obstruction. Electronically Signed   By: MLavonia DanaM.D.   On: 10/11/2016 18:24    Ct Abdomen Pelvis W Contrast  Result Date: 10/09/2016 CLINICAL DATA:  83year old female with a history of right upper quadrant pain with nausea and vomiting EXAM: CT ABDOMEN AND PELVIS WITH CONTRAST TECHNIQUE: Multidetector CT imaging of the abdomen and pelvis was performed using the standard protocol following bolus administration of intravenous contrast. CONTRAST:  80 cc Isovue-300 COMPARISON:  Ultrasound 10/09/2016 FINDINGS: Lower chest: Mixed linear/ nodular opacities at the lung bases compatible of atelectasis/ scarring. Nodule of the right middle lobe (image 2 series 4) is unchanged dating to the CT 12/30/2014, most likely benign. Hepatobiliary: Compare to prior there is mild dilation of the central bile ducts on the right an the left. With the common bile duct measuring as great is 10 mm, increased from the prior. The duct is dilated to the insertion to the duodenum. No radiopaque stone identified. Rapid tapering distally at the ampulla. Distention of the gallbladder with dependent radiopaque debris/stones. Mild gallbladder wall thickening and pericholecystic fluid. Pancreas: Unremarkable. No pancreatic ductal dilatation or surrounding inflammatory changes. Spleen: Normal in size without focal abnormality. Adrenals/Urinary Tract: Unremarkable appearance of the right adrenal gland. Unremarkable appearance of the left adrenal gland. Bilateral kidneys without hydronephrosis or nephrolithiasis. Unremarkable course the bilateral ureters. No perinephric stranding or inflammatory changes. Symmetric perfusion. Stomach/Bowel: Unremarkable stomach and small bowel. No abnormally distended small bowel or colon. Colonic diverticular disease. No associated inflammatory changes. Normal appendix. Vascular/Lymphatic: Mixed calcified and soft plaque of the lower thoracic aorta and abdominal aorta. The patient has had endovascular repair of infrarenal abdominal aortic aneurysm with endograft. The proximal margin of the  graft terminates just below the renal arteries, without evidence of migration. The excluded aneurysm sac has similar diameter on axial images to the comparison CT, currently measuring 5.8 cm x 5.5 cm on image 44. Previous measurements at this location 5.8 cm x 5.6 cm. No periaortic fluid or inflammatory changes. Bilateral limbs are patent. Moderate to advanced bilateral iliac system atherosclerotic changes, though remain patent. Proximal femoral vasculature remain patent bilaterally. Bilateral hypogastric arteries patent. This study is not tailored for the most sensitive evaluation of a type 2 endoleak. Stenosis at the origin of the celiac artery, potentially secondary to atherosclerotic changes and/or overlying diaphragmatic crus. No stenosis at the origin of the superior mesenteric artery. Atherosclerotic changes at the origin  the bilateral renal arteries, more pronounced on the left. Suprarenal aorta measures as great as 3.5 cm on the current study, which is increased from the comparison of 3.0 cm. Reproductive: Changes of hysterectomy Other: Small fact containing umbilical hernia. Musculoskeletal: Multilevel degenerative changes of the visualized spine. Changes most pronounced spanning L3-S1. Bilateral facet disease worst in the lower lumbar levels. No displaced fracture. Grade 1 anterolisthesis of L4 on L5 is unchanged. IMPRESSION: CT evidence of acute cholecystitis, with gallbladder distention, radiopaque stones/ debris, new intrahepatic bile duct dilation, and trace pericholecystic fluid. If a confirmatory test is needed, HIDA scan would be the next imaging test. The common bile duct is dilated compared to the prior CT scan, measuring as large as 10 mm. Abrupt distal tapering at the ampulla with no radiopaque stone identified. This is nonspecific although could represent new obstruction related to non radiopaque stones, inflammatory stricture, or much less likely neoplasm. Correlation with ERCP and/ or MRCP  may be considered. The patient has had infrarenal abdominal aortic aneurysm repair with aortic endograft, with the last surveillance study 12/30/2014. There has been no significant growth of the excluded aneurysm sac, however, the patient has diffuse aortic disease with increasing diameter of the suprarenal aorta, now measuring 3.5 cm. Continued surveillance is recommended. These results were called by telephone at the time of interpretation on 10/09/2016 at 10:17 am to Dr. Gustavus Messing, who verbally acknowledged these results. Diverticular disease without evidence of acute diverticulitis. Multilevel degenerative changes of the spine including grade 1 anterolisthesis of L4 on L5. Electronically Signed   By: Corrie Mckusick D.O.   On: 10/09/2016 10:19   US Abdomen Limited Ruq  Result Date: 10/09/2016 CLINICAL DATA:  Upper abdominal pain EXAM: US ABDOMEN LIMITED - RIGHT UPPER QUADRANT COMPARISON:  CT abdomen and pelvis December 30, 2014 FINDINGS: Gallbladder: Gallbladder is mildly distended. There are echogenic foci in the gallbladder which move and shadow consistent with gallstones. Largest gallstone measures 1.5 cm in length. There is no gallbladder wall thickening or pericholecystic fluid. There is no appreciable gallbladder wall thickening or pericholecystic fluid. No sonographic Murphy sign noted by sonographer. Common bile duct: Diameter: Distended at 10 mm. No mass or calculus is appreciable in the biliary ductal system. Liver: No focal lesion identified. Liver echogenicity is somewhat inhomogeneous and overall increased. IMPRESSION: Cholelithiasis.  Gallbladder slightly distended. Common bile duct dilatation without mass or calculus seen by ultrasound. Increased liver echogenicity most likely is indicative of hepatic steatosis. While no focal liver lesions are evident on this study, it must be cautioned that the sensitivity of ultrasound for detection of focal liver lesions is diminished in this circumstance.  Electronically Signed   By: Lowella Grip III M.D.   On: 10/09/2016 09:57    EKG & Cardiac Imaging   EKG: Sinus rhythm 89 bpm, Left axis deviation, Right bundle branch block- similar to EKG in 10/2011  Tele showed atrial fib with RVR 140's-170 beginning at 1205 lasting til 1330 and now with intermittent bursts.   Echocardiogram: Pending  Assessment & Plan    Atrial fibrillation -New onset on tele today, prior EKG showed NSR. Pt is asymptomatic and likely pain related.  -No previous MI or cardiac events. Has CVD risk factors of HTN, HLD, DM, age -Will check echocardiogram for LV function, wall motion and valve status. -This patients CHA2DS2-VASc Score is at least 6 (HTN, DM, Age (2), female, vascular). She is already on long term anticoagulation with coumadin for history of PE and has no  abnormal bleeding. Currently on hold for possible surgery with heparin drip. -Atrial fib can be managed perioperatively. Lap chole would be acceptable procedure if needed. Keep pt on telemetry post op. Continue metoprolol 5 mg IV q 6h. . Will start diltiazem drip and titrate as needed for continued afib with RVR. Cardiologist to address for final input.   AAA s/p endovascular repair 2004 -AAA s/p endograft repair was seen on CT chest/abd without significant interval dilatation  Chronic anticoagulation -Hx PE in 2011, on coumadin which is currently on hold for possible surgery. IV Heparin per pharmacy protocol.  Cholelithiasis -Being managed by GI and surgery.  HTN -Home meds include metoprolol 50 mg bid, Maxzide 37.5/25 mg daily -Oral meds on hold while NPO. Metoprolol being given IV, dose increased from 5 mg to 10 mg to control a fib. Hydralazine is available for prn use. BP is fluctuation. Continue to monitor.   HLD -Last lipid panel in EPIC 06/2015: LDL 77, Trig 141 -Managed with lovastatin and Lovaza  Type 2 DM -Hemoglobin A1c 6.9 (10/09/2016) -Glipizide and metformin- off oral meds here  and managed with SSI  Signed, Daune Perch, NP-C 10/12/2016, 4:44 PM Pager: [phone]

## 2016-10-12 NOTE — Progress Notes (Signed)
ANTICOAGULATION CONSULT NOTE - Initial Consult  Pharmacy Consult for heparin Indication: atrial fibrillation, hypercoag state  Allergies  Allergen Reactions  . Penicillins Nausea And Vomiting, Rash and Other (See Comments)    Childhood reaction    Patient Measurements: Height: '5\' 5"'$  (165.1 cm) Weight: 151 lb 1.6 oz (68.5 kg) IBW/kg (Calculated) : 57 Heparin Dosing Weight: 68kg  Vital Signs: Temp: 98.4 F (36.9 C) (03/27 1000) Temp Source: Oral (03/27 1000) BP: 110/67 (03/27 1337) Pulse Rate: 92 (03/27 1337)  Labs:  Recent Labs  10/10/16 0530 10/11/16 0709 10/12/16 0608  HGB 12.8 12.8 13.0  HCT 37.4 36.8 38.3  PLT 222 190 207  APTT 34  --   --   LABPROT 24.7* 24.7* 21.1*  INR 2.19 2.18 1.80  CREATININE 1.09* 1.00  --     Estimated Creatinine Clearance: 42.2 mL/min (by C-G formula based on SCr of 1 mg/dL).   Assessment: 26 YOF on warfarin PTA for history of hypercoagulable state and PE. Warfarin on hold in anticipation of GI surgery. INR down to 1.8 this morning, and patient with new onset AFib- to start IV heparin as bridge. Hgb and platelets within normal limits. No bleeding noted.  Plan for MRCP today with possible lap chole tomorrow per GI notes.  Goal of Therapy:  Heparin level 0.3-0.7 units/ml Monitor platelets by anticoagulation protocol: Yes   Plan:  -start heparin at 850 units/hr (NO BOLUS) -first heparin level in 8 hours -daily heparin level and CBC -follow need to turn heparin off for procedure  Aneliz Carbary D. Lilymae Swiech, PharmD, BCPS Clinical Pharmacist Pager: [phone] 10/12/2016 2:15 PM

## 2016-10-12 NOTE — Progress Notes (Signed)
  Subjective: Pain this morning is still in the LEFT LOWer quadrant. Resolved with pain meds. Some nausea as well. INR 1.8  Objective: Vital signs in last 24 hours: Temp:  [98.3 F (36.8 C)-98.5 F (36.9 C)] 98.3 F (36.8 C) (03/27 0537) Pulse Rate:  [74-95] 95 (03/27 0537) Resp:  [17-18] 18 (03/27 0537) BP: (152-179)/(90-92) 152/90 (03/27 0537) SpO2:  [95 %-100 %] 97 % (03/27 0830) Weight:  [68.5 kg (151 lb 1.6 oz)] 68.5 kg (151 lb 1.6 oz) (03/26 2356) Last BM Date: 10/07/16  Intake/Output from previous day: 03/26 0701 - 03/27 0700 In: 0  Out: 700 [Urine:700] Intake/Output this shift: No intake/output data recorded.  General appearance: cooperative Resp: clear to auscultation bilaterally GI: soft, NT  Lab Results:   Recent Labs  10/11/16 0709 10/12/16 0608  WBC 3.9* 5.5  HGB 12.8 13.0  HCT 36.8 38.3  PLT 190 207   BMET  Recent Labs  10/10/16 0530 10/11/16 0709  NA 141 137  K 3.8 3.7  CL 96* 96*  CO2 34* 29  GLUCOSE 131* 150*  BUN 14 10  CREATININE 1.09* 1.00  CALCIUM 9.0 8.6*   PT/INR  Recent Labs  10/11/16 0709 10/12/16 0608  LABPROT 24.7* 21.1*  INR 2.18 1.80   ABG No results for input(s): PHART, HCO3 in the last 72 hours.  Invalid input(s): PCO2, PO2  Studies/Results: Nm Hepatobiliary Liver Func  Result Date: 10/11/2016 CLINICAL DATA:  Nausea, vomiting, and abdominal pain since 10/08/2016, suspected cholecystitis by CT EXAM: NUCLEAR MEDICINE HEPATOBILIARY IMAGING TECHNIQUE: Sequential images of the abdomen were obtained out to 60 minutes following intravenous administration of radiopharmaceutical. RADIOPHARMACEUTICALS:  5.29 mCi Tc-14mCholetec IV COMPARISON:  CT abdomen and pelvis 10/09/2016 FINDINGS: Normal tracer extraction from bloodstream indicating normal hepatocellular function. Prompt concentration and excretion of tracer into the central biliary tree. Gallbladder visualized at 40 minutes. At 1 hour, bowel had not visualized.  Additional imaging over on additional power showed no small bowel visualization. This can be seen as a physiologic finding in patients who are NPO without stimulus for gallbladder contraction or relaxation of the sphincter of Oddi, in patients receiving narcotics, and with CBD obstruction. IMPRESSION: Patent cystic duct. Nonvisualization of the gallbladder at 2 hours, which can be physiologic, due to prior narcotic administration, or secondary to CBD obstruction. Electronically Signed   By: MLavonia DanaM.D.   On: 10/11/2016 18:24    Anti-infectives: Anti-infectives    None      Assessment/Plan: Cholelithiasis - pain resolved and then changed in location. Has not had further upper abdominal pain. HIDA negative for cholecystitis, but no emptying seen into bowel. This finding combined with mild CBD and central hepatic duct dilation is concerning for partial obstruction at level of ampulla. Recommend GI consult. Will continue to follow.    LOS: 2 days    CClovis Riley3/27/2018

## 2016-10-12 NOTE — Care Management CC44 (Signed)
Yes       Condition Code 44 Documentation Completed  Patient Details  Name: Alejandra [name] MRN: [id number] Date of Birth: Jul 16, 1932   Condition Code 44 given:   Yes Patient signature on Condition Code 44 notice:   No Documentation of 2 MD's agreement:   Yes Code 44 added to claim:   Yes Code 44 explained in detail to pt and daughter, pt concerned that she may not be able to pay the cost not covered by her insurance due to OBS status. After discussion she is feels that the [place] will help her as they have in the past. Code 44 form not signed.    Dushawn Pusey, Rory Percy, RN 10/12/2016, 11:37 AM

## 2016-10-12 NOTE — Care Management CC44 (Signed)
Condition Code 44 Documentation Completed  Patient Details  Name: Alejandra [name] MRN: [id number] Date of Birth: [birth year]   Condition Code 44 given:    Patient signature on Condition Code 44 notice:    Documentation of 2 MD's agreement:    Code 44 added to claim:       Adron Bene, RN 10/12/2016, 11:36 AM

## 2016-10-12 NOTE — Care Management Obs Status (Signed)
[place] NOTIFICATION   Patient Details  Name: ORRIE LASCANO MRN: [id number] Date of Birth: 11-21-1931   Medicare Observation Status Notification Given:  Yes    Zachary Nole, Rory Percy, RN 10/12/2016, 11:40 AM

## 2016-10-12 NOTE — Progress Notes (Signed)
ANTICOAGULATION CONSULT NOTE - Follow Up Consult  Pharmacy Consult for Heparin  Indication: Hx PE/hypercoag state  Allergies  Allergen Reactions  . Penicillins Nausea And Vomiting, Rash and Other (See Comments)    Childhood reaction   Patient Measurements: Height: '5\' 5"'$  (165.1 cm) Weight: 151 lb (68.5 kg) IBW/kg (Calculated) : 57  Vital Signs: Temp: 98.2 F (36.8 C) (03/27 2010) Temp Source: Oral (03/27 1707) BP: 105/73 (03/27 2010) Pulse Rate: 95 (03/27 2010)  Labs:  Recent Labs  10/10/16 0530 10/11/16 0709 10/12/16 0608 10/12/16 2223  HGB 12.8 12.8 13.0  --   HCT 37.4 36.8 38.3  --   PLT 222 190 207  --   APTT 34  --   --   --   LABPROT 24.7* 24.7* 21.1*  --   INR 2.19 2.18 1.80  --   HEPARINUNFRC  --   --   --  0.33  CREATININE 1.09* 1.00  --   --     Estimated Creatinine Clearance: 42.2 mL/min (by C-G formula based on SCr of 1 mg/dL).   Assessment: 83 y/o F on heparin while warfarin on hold in anticipation of surgery, initial heparin level is 0.33  Goal of Therapy:  Heparin level 0.3-0.7 units/ml Monitor platelets by anticoagulation protocol: Yes   Plan:  -Cont heparin at 850 units/hr -Confirmatory HL with AM labs  Narda Bonds 10/12/2016,11:48 PM

## 2016-10-12 NOTE — Progress Notes (Addendum)
Paged MD Bonner Puna about pt starting on Cardizem drip and needing pt transfer to another unit.   1745 Notified RRT that pt will be transferred to stepdown for control rate.  Awaiting bed placement.    1830 HR 88 NSR  Notified MD Grunz and Cardiologist of pt's HR is 98 NSR and pt is asymptomatic. Transfer has been cancelled and MAR updated.   Will continue to monitor.   Paulla Fore, RN

## 2016-10-12 NOTE — Consult Note (Addendum)
Reason for Consult: Abnormal HIDA scan Referring Physician: Hospital team  Alejandra [name] is an 83 y.o. female.  HPI: Patient with changing abdominal pain with known gallstones for years and multiple abdominal pain admissions currently without pain and her hospital computer chart reviewed and her case discussed with her daughter and she has not had a bowel movement since he's been here but her liver tests have been normal  Past Medical History:  Diagnosis Date  . AAA (abdominal aortic aneurysm) ([place])   . COPD (chronic obstructive pulmonary disease) ([place])   . DM (diabetes mellitus) ([place])   . Dyslipidemia   . Hyperlipidemia   . Hypertension   . Left foot infection 2013  . 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 2013  . Pulmonary embolism [facility])     Past Surgical History:  Procedure Laterality Date  . ABDOMINAL AORTIC ANEURYSM REPAIR  10-30-2011  . ENDOVASCULAR STENT INSERTION  10/30/2011   Procedure: ENDOVASCULAR STENT GRAFT INSERTION;  Surgeon: Serafina Mitchell, MD;  Location: [facility] OR;  Service: Vascular;  Laterality: N/A;  . VESICOVAGINAL FISTULA CLOSURE W/ TAH      Family History  Problem Relation Age of Onset  . Heart disease Mother   . Hypertension Mother   . Heart attack Mother   . Hypertension Father   . Diabetes Father   . Heart attack Father   . Heart disease Sister     x 3  . Hypertension Sister   . Heart attack Sister   . Clotting disorder Daughter   . Clotting disorder Son     multiple sons  . Diabetes Son   . Hypertension Brother     Social History:  reports that she quit smoking about 6 years ago. She has a 27.00 pack-year smoking history. She has never used smokeless tobacco. She reports that she does not drink alcohol or use drugs.  Allergies:  Allergies  Allergen Reactions  . Penicillins Nausea And Vomiting, Rash and Other (See Comments)    Childhood reaction    Medications: I have reviewed the patient's current medications.  Results for orders placed or performed  during the hospital encounter of 10/09/16 (from the past 48 hour(s))  Glucose, capillary     Status: Abnormal   Collection Time: 10/10/16 12:11 PM  Result Value Ref Range   Glucose-Capillary 57 (L) 65 - 99 mg/dL  Glucose, capillary     Status: Abnormal   Collection Time: 10/10/16 12:54 PM  Result Value Ref Range   Glucose-Capillary 156 (H) 65 - 99 mg/dL  Glucose, capillary     Status: Abnormal   Collection Time: 10/10/16  4:18 PM  Result Value Ref Range   Glucose-Capillary 138 (H) 65 - 99 mg/dL  Glucose, capillary     Status: Abnormal   Collection Time: 10/10/16 11:34 PM  Result Value Ref Range   Glucose-Capillary 136 (H) 65 - 99 mg/dL  Protime-INR     Status: Abnormal   Collection Time: 10/11/16  7:09 AM  Result Value Ref Range   Prothrombin Time 24.7 (H) 11.4 - 15.2 seconds   INR 2.18   CBC     Status: Abnormal   Collection Time: 10/11/16  7:09 AM  Result Value Ref Range   WBC 3.9 (L) 4.0 - 10.5 K/uL   RBC 4.73 3.87 - 5.11 MIL/uL   Hemoglobin 12.8 12.0 - 15.0 g/dL   HCT 36.8 36.0 - 46.0 %   MCV 77.8 (L) 78.0 - 100.0 fL   MCH 27.1 26.0 -  34.0 pg   MCHC 34.8 30.0 - 36.0 g/dL   RDW 17.3 (H) 11.5 - 15.5 %   Platelets 190 150 - 400 K/uL  Comprehensive metabolic panel     Status: Abnormal   Collection Time: 10/11/16  7:09 AM  Result Value Ref Range   Sodium 137 135 - 145 mmol/L   Potassium 3.7 3.5 - 5.1 mmol/L   Chloride 96 (L) 101 - 111 mmol/L   CO2 29 22 - 32 mmol/L   Glucose, Bld 150 (H) 65 - 99 mg/dL   BUN 10 6 - 20 mg/dL   Creatinine, Ser 1.00 0.44 - 1.00 mg/dL   Calcium 8.6 (L) 8.9 - 10.3 mg/dL   Total Protein 7.0 6.5 - 8.1 g/dL   Albumin 3.5 3.5 - 5.0 g/dL   AST 22 15 - 41 U/L   ALT 14 14 - 54 U/L   Alkaline Phosphatase 30 (L) 38 - 126 U/L   Total Bilirubin 0.6 0.3 - 1.2 mg/dL   GFR calc non Af Amer 51 (L) >60 mL/min   GFR calc Af Amer 59 (L) >60 mL/min    Comment: (NOTE) The eGFR has been calculated using the CKD EPI equation. This calculation has not been  validated in all clinical situations. eGFR's persistently <60 mL/min signify possible Chronic Kidney Disease.    Anion gap 12 5 - 15  Glucose, capillary     Status: Abnormal   Collection Time: 10/11/16  8:11 AM  Result Value Ref Range   Glucose-Capillary 132 (H) 65 - 99 mg/dL  Glucose, capillary     Status: Abnormal   Collection Time: 10/11/16 11:35 AM  Result Value Ref Range   Glucose-Capillary 124 (H) 65 - 99 mg/dL  Glucose, capillary     Status: Abnormal   Collection Time: 10/11/16  6:01 PM  Result Value Ref Range   Glucose-Capillary 120 (H) 65 - 99 mg/dL  Glucose, capillary     Status: Abnormal   Collection Time: 10/12/16 12:00 AM  Result Value Ref Range   Glucose-Capillary 123 (H) 65 - 99 mg/dL  Protime-INR     Status: Abnormal   Collection Time: 10/12/16  6:08 AM  Result Value Ref Range   Prothrombin Time 21.1 (H) 11.4 - 15.2 seconds   INR 1.80   CBC     Status: Abnormal   Collection Time: 10/12/16  6:08 AM  Result Value Ref Range   WBC 5.5 4.0 - 10.5 K/uL   RBC 4.94 3.87 - 5.11 MIL/uL   Hemoglobin 13.0 12.0 - 15.0 g/dL   HCT 38.3 36.0 - 46.0 %   MCV 77.5 (L) 78.0 - 100.0 fL   MCH 26.3 26.0 - 34.0 pg   MCHC 33.9 30.0 - 36.0 g/dL   RDW 16.9 (H) 11.5 - 15.5 %   Platelets 207 150 - 400 K/uL  Glucose, capillary     Status: Abnormal   Collection Time: 10/12/16  7:46 AM  Result Value Ref Range   Glucose-Capillary 140 (H) 65 - 99 mg/dL    Nm Hepatobiliary Liver Func  Result Date: 10/11/2016 CLINICAL DATA:  Nausea, vomiting, and abdominal pain since 10/08/2016, suspected cholecystitis by CT EXAM: NUCLEAR MEDICINE HEPATOBILIARY IMAGING TECHNIQUE: Sequential images of the abdomen were obtained out to 60 minutes following intravenous administration of radiopharmaceutical. RADIOPHARMACEUTICALS:  5.29 mCi Tc-39mCholetec IV COMPARISON:  CT abdomen and pelvis 10/09/2016 FINDINGS: Normal tracer extraction from bloodstream indicating normal hepatocellular function. Prompt  concentration and excretion of tracer into the  central biliary tree. Gallbladder visualized at 40 minutes. At 1 hour, bowel had not visualized. Additional imaging over on additional power showed no small bowel visualization. This can be seen as a physiologic finding in patients who are NPO without stimulus for gallbladder contraction or relaxation of the sphincter of Oddi, in patients receiving narcotics, and with CBD obstruction. IMPRESSION: Patent cystic duct. Nonvisualization of the gallbladder at 2 hours, which can be physiologic, due to prior narcotic administration, or secondary to CBD obstruction. Electronically Signed   By: Lavonia Dana M.D.   On: 10/11/2016 18:24    ROS negative except above Blood pressure (!) 158/95, pulse 92, temperature 98.4 F (36.9 C), temperature source Oral, resp. rate 18, height 5' 5"  (1.651 m), weight 68.5 kg (151 lb 1.6 oz), SpO2 92 %. Physical Exam no acute distress lying comfortably in the bed abdomen is soft nontender good bowel sounds ultrasound CT and HIDA scan reviewed labs pertinent for normal white count normal liver tests multiple times  Assessment/Plan: Multiple medical problems in patient with abnormal HIDA scan and gallstones although I cannot totally explain the HIDA scan although the use of pain medicine can cause this appearance and I doubt she has CBD obstruction in the face of normal liver tests Plan: I would proceed with laparoscopic cholecystectomy and Intra-Op cholangiogram however if surgery is uncomfortable with that approach will proceed with MRCP just to be sure and expected to show a dilated CBD without any stones and then would proceed with surgery as above and will repeat liver tests tomorrow just to be sure and will allow clear liquids for now  [facility] E 10/12/2016, 10:18 AM

## 2016-10-13 ENCOUNTER — Inpatient Hospital Stay (HOSPITAL_COMMUNITY): Payer: Medicare HMO

## 2016-10-13 DIAGNOSIS — I480 Paroxysmal atrial fibrillation: Secondary | ICD-10-CM

## 2016-10-13 DIAGNOSIS — I4891 Unspecified atrial fibrillation: Secondary | ICD-10-CM

## 2016-10-13 LAB — CBC
HCT: 37.2 % (ref 36.0–46.0)
Hemoglobin: 12.7 g/dL (ref 12.0–15.0)
MCH: 26.6 pg (ref 26.0–34.0)
MCHC: 34.1 g/dL (ref 30.0–36.0)
MCV: 77.8 fL — ABNORMAL LOW (ref 78.0–100.0)
PLATELETS: 198 10*3/uL (ref 150–400)
RBC: 4.78 MIL/uL (ref 3.87–5.11)
RDW: 17.2 % — AB (ref 11.5–15.5)
WBC: 4.5 10*3/uL (ref 4.0–10.5)

## 2016-10-13 LAB — GLUCOSE, CAPILLARY
GLUCOSE-CAPILLARY: 127 mg/dL — AB (ref 65–99)
GLUCOSE-CAPILLARY: 140 mg/dL — AB (ref 65–99)
GLUCOSE-CAPILLARY: 100 mg/dL — AB (ref 65–99)
Glucose-Capillary: 128 mg/dL — ABNORMAL HIGH (ref 65–99)
Glucose-Capillary: 132 mg/dL — ABNORMAL HIGH (ref 65–99)
Glucose-Capillary: 133 mg/dL — ABNORMAL HIGH (ref 65–99)

## 2016-10-13 LAB — IRON AND TIBC
Iron: 58 ug/dL (ref 28–170)
SATURATION RATIOS: 16 % (ref 10.4–31.8)
TIBC: 353 ug/dL (ref 250–450)
UIBC: 295 ug/dL

## 2016-10-13 LAB — COMPREHENSIVE METABOLIC PANEL
ALBUMIN: 3.2 g/dL — AB (ref 3.5–5.0)
ALT: 13 U/L — AB (ref 14–54)
AST: 19 U/L (ref 15–41)
Alkaline Phosphatase: 29 U/L — ABNORMAL LOW (ref 38–126)
Anion gap: 9 (ref 5–15)
BUN: 14 mg/dL (ref 6–20)
CHLORIDE: 99 mmol/L — AB (ref 101–111)
CO2: 29 mmol/L (ref 22–32)
CREATININE: 1.08 mg/dL — AB (ref 0.44–1.00)
Calcium: 8.4 mg/dL — ABNORMAL LOW (ref 8.9–10.3)
GFR calc Af Amer: 54 mL/min — ABNORMAL LOW (ref >60)
GFR calc non Af Amer: 46 mL/min — ABNORMAL LOW (ref >60)
Glucose, Bld: 143 mg/dL — ABNORMAL HIGH (ref 65–99)
POTASSIUM: 3.1 mmol/L — AB (ref 3.5–5.1)
SODIUM: 137 mmol/L (ref 135–145)
Total Bilirubin: 0.7 mg/dL (ref 0.3–1.2)
Total Protein: 6.4 g/dL — ABNORMAL LOW (ref 6.5–8.1)

## 2016-10-13 LAB — RETICULOCYTES
RBC.: 4.78 MIL/uL (ref 3.87–5.11)
RETIC CT PCT: 1.4 % (ref 0.4–3.1)
Retic Count, Absolute: 66.9 10*3/uL (ref 19.0–186.0)

## 2016-10-13 LAB — ECHOCARDIOGRAM COMPLETE
CHL CUP DOP CALC LVOT VTI: 19 cm
CHL CUP RV SYS PRESS: 52 mmHg
EERAT: 14.16
EWDT: 190 ms
FS: 29 % (ref 28–44)
Height: 65 in
IV/PV OW: 1.44
LA diam end sys: 32 mm
LA diam index: 1.82 cm/m2
LA vol: 32.6 mL
LASIZE: 32 mm
LAVOLA4C: 30.3 mL
LAVOLIN: 18.5 mL/m2
LV E/e' medial: 14.16
LV E/e'average: 14.16
LV TDI E'MEDIAL: 4.35
LVOT SV: 54 mL
LVOT area: 2.84 cm2
LVOTD: 19 mm
LVOTPV: 93.1 cm/s
Lateral S' vel: 10.9 cm/s
MV Dec: 190
MV pk E vel: 61.6 m/s
MVPKAVEL: 85.4 m/s
PW: 9 mm — AB (ref 0.6–1.1)
Reg peak vel: 350 cm/s
TAPSE: 17.8 mm
TR max vel: 350 cm/s
Weight: 2416 oz

## 2016-10-13 LAB — VITAMIN B12: Vitamin B-12: 1159 pg/mL — ABNORMAL HIGH (ref 180–914)

## 2016-10-13 LAB — PROTIME-INR
INR: 1.64
Prothrombin Time: 19.6 seconds — ABNORMAL HIGH (ref 11.4–15.2)

## 2016-10-13 LAB — FERRITIN: Ferritin: 22 ng/mL (ref 11–307)

## 2016-10-13 LAB — HEPARIN LEVEL (UNFRACTIONATED): Heparin Unfractionated: 0.53 IU/mL (ref 0.30–0.70)

## 2016-10-13 LAB — FOLATE: Folate: 36.5 ng/mL (ref >5.9)

## 2016-10-13 MED ORDER — METOPROLOL TARTRATE 5 MG/5ML IV SOLN
5.0000 mg | Freq: Four times a day (QID) | INTRAVENOUS | Status: DC
  Administered 2016-10-13 – 2016-10-15 (×6): 5 mg via INTRAVENOUS
  Filled 2016-10-13 (×6): qty 5

## 2016-10-13 MED ORDER — INSULIN ASPART 100 UNIT/ML ~~LOC~~ SOLN
0.0000 [IU] | Freq: Three times a day (TID) | SUBCUTANEOUS | Status: DC
  Administered 2016-10-14: 3 [IU] via SUBCUTANEOUS
  Administered 2016-10-15 (×2): 1 [IU] via SUBCUTANEOUS

## 2016-10-13 MED ORDER — INSULIN ASPART 100 UNIT/ML ~~LOC~~ SOLN
0.0000 [IU] | SUBCUTANEOUS | Status: DC
  Administered 2016-10-13: 1 [IU] via SUBCUTANEOUS

## 2016-10-13 MED ORDER — KCL IN DEXTROSE-NACL 30-5-0.45 MEQ/L-%-% IV SOLN
INTRAVENOUS | Status: AC
  Administered 2016-10-13 – 2016-10-14 (×2): via INTRAVENOUS
  Filled 2016-10-13 (×3): qty 1000

## 2016-10-13 MED ORDER — POTASSIUM CHLORIDE CRYS ER 10 MEQ PO TBCR
30.0000 meq | EXTENDED_RELEASE_TABLET | Freq: Once | ORAL | Status: AC
  Administered 2016-10-13: 30 meq via ORAL
  Filled 2016-10-13: qty 1

## 2016-10-13 MED ORDER — ORAL CARE MOUTH RINSE
15.0000 mL | Freq: Two times a day (BID) | OROMUCOSAL | Status: DC
  Administered 2016-10-13 – 2016-10-16 (×5): 15 mL via OROMUCOSAL

## 2016-10-13 NOTE — Plan of Care (Signed)
Problem: Education: Goal: Knowledge of  General Education information/materials will improve Outcome: Progressing Went over POC with patient and daughter at bedside.

## 2016-10-13 NOTE — Progress Notes (Signed)
[facility] Progress Note     Subjective: Chief complaint of LLQ pain. Denies chest pain, shortness of breath, or palpitations. Denies nausea/vomiting overnight. Multiple BMs yesterday.  Episodes of non-sustained a.fib/flutter overnight and bradycardia noted.   Objective: Vital signs in last 24 hours: Temp:  [97.8 F (36.6 C)-98.7 F (37.1 C)] 97.9 F (36.6 C) (03/28 0530) Pulse Rate:  [58-133] 81 (03/28 0530) Resp:  [13-18] 16 (03/28 0530) BP: (98-160)/(64-95) 160/83 (03/28 0530) SpO2:  [92 %-100 %] 100 % (03/28 0530) Weight:  [68.5 kg (151 lb)] 68.5 kg (151 lb) (03/27 2148) Last BM Date: 10/12/16  Intake/Output from previous day: 03/27 0701 - 03/28 0700 In: 922 [P.O.:240; I.V.:682] Out: 122 [Urine:120; Emesis/NG output:1; Stool:1] Intake/Output this shift: No intake/output data recorded.  PE: Gen:  Alert, NAD, pleasant and cooperating HEENT: [place] in place Card:  Regular rate Pulm:  Non-labored, clear to auscultation bilaterally  Abd: Soft, non-distended, TTP LLQ without rebound tenderness, guarding, or peritonitis,  hypoactive bowel sounds  Lab Results:   Recent Labs  10/12/16 0608 10/13/16 0638  WBC 5.5 4.5  HGB 13.0 12.7  HCT 38.3 37.2  PLT 207 198   BMET  Recent Labs  10/11/16 0709  NA 137  K 3.7  CL 96*  CO2 29  GLUCOSE 150*  BUN 10  CREATININE 1.00  CALCIUM 8.6*   PT/INR  Recent Labs  10/11/16 0709 10/12/16 0608  LABPROT 24.7* 21.1*  INR 2.18 1.80   CMP     Component Value Date/Time   NA 137 10/11/2016 0709   K 3.7 10/11/2016 0709   CL 96 (L) 10/11/2016 0709   CO2 29 10/11/2016 0709   GLUCOSE 150 (H) 10/11/2016 0709   BUN 10 10/11/2016 0709   CREATININE 1.00 10/11/2016 0709   CREATININE 1.10 06/21/2014 0953   CALCIUM 8.6 (L) 10/11/2016 0709   PROT 7.0 10/11/2016 0709   ALBUMIN 3.5 10/11/2016 0709   AST 22 10/11/2016 0709   ALT 14 10/11/2016 0709   ALKPHOS 30 (L) 10/11/2016 0709   BILITOT 0.6 10/11/2016 0709    GFRNONAA 51 (L) 10/11/2016 0709   GFRAA 59 (L) 10/11/2016 0709   Lipase     Component Value Date/Time   LIPASE 23 10/09/2016 0455       Studies/Results: Nm Hepatobiliary Liver Func  Result Date: 10/11/2016 CLINICAL DATA:  Nausea, vomiting, and abdominal pain since 10/08/2016, suspected cholecystitis by CT EXAM: NUCLEAR MEDICINE HEPATOBILIARY IMAGING TECHNIQUE: Sequential images of the abdomen were obtained out to 60 minutes following intravenous administration of radiopharmaceutical. RADIOPHARMACEUTICALS:  5.29 mCi Tc-19mCholetec IV COMPARISON:  CT abdomen and pelvis 10/09/2016 FINDINGS: Normal tracer extraction from bloodstream indicating normal hepatocellular function. Prompt concentration and excretion of tracer into the central biliary tree. Gallbladder visualized at 40 minutes. At 1 hour, bowel had not visualized. Additional imaging over on additional power showed no small bowel visualization. This can be seen as a physiologic finding in patients who are NPO without stimulus for gallbladder contraction or relaxation of the sphincter of Oddi, in patients receiving narcotics, and with CBD obstruction. IMPRESSION: Patent cystic duct. Nonvisualization of the gallbladder at 2 hours, which can be physiologic, due to prior narcotic administration, or secondary to CBD obstruction. Electronically Signed   By: MLavonia DanaM.D.   On: 10/11/2016 18:24   Mr 3d Recon At Scanner  Result Date: 10/13/2016 CLINICAL DATA:  Nausea and vomiting with abdominal pain. Suspected cholecystitis by CT. Nuclear medicine study revealed patent cystic duct with  nonvisualization of gut activity. EXAM: MRI ABDOMEN WITHOUT AND WITH CONTRAST (INCLUDING MRCP) TECHNIQUE: Multiplanar multisequence MR imaging of the abdomen was performed both before and after the administration of intravenous contrast. Heavily T2-weighted images of the biliary and pancreatic ducts were obtained, and three-dimensional MRCP images were rendered by  post processing. CONTRAST:  7m MULTIHANCE GADOBENATE DIMEGLUMINE 529 MG/ML IV SOLN COMPARISON:  Nuclear medicine study 10/11/2016.  CT scan 10/09/2016. FINDINGS: Lower chest:  Heart is enlarged. Hepatobiliary: The liver unremarkable. Gallbladder is distended. Tiny layering stones are identified in the gallbladder lumen. No substantial intrahepatic biliary duct dilatation. Extrahepatic common duct measures 8 mm diameter which is upper normal for patient age. Common bile duct in the head of the pancreas is 6 mm, within normal limits for age. There is no filling defect in the common duct or common bile duct to suggest choledocholithiasis. Pancreas: No focal mass lesion. No dilatation of the main duct. No intraparenchymal cyst. No peripancreatic edema. No evidence for pancreas divisum Spleen: No splenomegaly. No focal mass lesion. Adrenals/Urinary Tract: Left adrenal thickening noted without nodule. Right adrenal gland unremarkable. Cortical scarring noted both kidneys no hydronephrosis. No enhancing mass lesion. Stomach/Bowel: No evidence for dilated small bowel. Diverticular changes are noted in the left colon. Vascular/Lymphatic: Patient is status post aortic endograft placement for infrarenal aneurysm. There is no gastrohepatic or hepatoduodenal ligament lymphadenopathy. No intraperitoneal or retroperitoneal lymphadenopathy. Other: No intraperitoneal free fluid. Musculoskeletal: No abnormal marrow enhancement within the visualized bony anatomy. IMPRESSION: 1. Cholelithiasis without choledocholithiasis. The common duct distention has decreased since prior CT scan with both the common duct and common bile duct measuring normal diameter for patient age on today's study. No evidence for ampullary mass lesion or pancreatic head mass by MRI. There is no associated dilatation of the main pancreatic duct. 2. Status post endograft repair of abdominal aortic aneurysm. 3. Left colonic diverticulosis. Electronically Signed    By: EMisty StanleyM.D.   On: 10/13/2016 07:41   Mr Abdomen Mrcp WMoise BoringContast  Result Date: 10/13/2016 CLINICAL DATA:  Nausea and vomiting with abdominal pain. Suspected cholecystitis by CT. Nuclear medicine study revealed patent cystic duct with nonvisualization of gut activity. EXAM: MRI ABDOMEN WITHOUT AND WITH CONTRAST (INCLUDING MRCP) TECHNIQUE: Multiplanar multisequence MR imaging of the abdomen was performed both before and after the administration of intravenous contrast. Heavily T2-weighted images of the biliary and pancreatic ducts were obtained, and three-dimensional MRCP images were rendered by post processing. CONTRAST:  153mMULTIHANCE GADOBENATE DIMEGLUMINE 529 MG/ML IV SOLN COMPARISON:  Nuclear medicine study 10/11/2016.  CT scan 10/09/2016. FINDINGS: Lower chest:  Heart is enlarged. Hepatobiliary: The liver unremarkable. Gallbladder is distended. Tiny layering stones are identified in the gallbladder lumen. No substantial intrahepatic biliary duct dilatation. Extrahepatic common duct measures 8 mm diameter which is upper normal for patient age. Common bile duct in the head of the pancreas is 6 mm, within normal limits for age. There is no filling defect in the common duct or common bile duct to suggest choledocholithiasis. Pancreas: No focal mass lesion. No dilatation of the main duct. No intraparenchymal cyst. No peripancreatic edema. No evidence for pancreas divisum Spleen: No splenomegaly. No focal mass lesion. Adrenals/Urinary Tract: Left adrenal thickening noted without nodule. Right adrenal gland unremarkable. Cortical scarring noted both kidneys no hydronephrosis. No enhancing mass lesion. Stomach/Bowel: No evidence for dilated small bowel. Diverticular changes are noted in the left colon. Vascular/Lymphatic: Patient is status post aortic endograft placement for infrarenal aneurysm. There is no  gastrohepatic or hepatoduodenal ligament lymphadenopathy. No intraperitoneal or retroperitoneal  lymphadenopathy. Other: No intraperitoneal free fluid. Musculoskeletal: No abnormal marrow enhancement within the visualized bony anatomy. IMPRESSION: 1. Cholelithiasis without choledocholithiasis. The common duct distention has decreased since prior CT scan with both the common duct and common bile duct measuring normal diameter for patient age on today's study. No evidence for ampullary mass lesion or pancreatic head mass by MRI. There is no associated dilatation of the main pancreatic duct. 2. Status post endograft repair of abdominal aortic aneurysm. 3. Left colonic diverticulosis. Electronically Signed   By: Misty Stanley M.D.   On: 10/13/2016 07:41    Anti-infectives: Anti-infectives    None     Assessment/Plan Cholelithiasis - abdominal pain associated with nausea and vomiting. Pain was initially in the upper abdomen but has localized to LLQ. - LFT's WNL, no leukocytosis  - HIDA negative for cholecystitis, though no emptying into bowel seen.  - MRCP negative for choledocholithiasis or ampullary lesion  Atrial fibrillation w/ RVR- new; appreciate cardiology seeing and clearing pt for OR. Metoprolol 5 mg q 6h Hx PE on Coumadin - held, heparin gtt per pharmacy; INR 1.8 COPD Dyslipidemia Diabetes Mellitus  CKD III HTN Chronic respiratory failure   Plan: patients abdominal pain is atypical for symptomatic cholelithiasis, all labs WNL, no signs cholecystitis. Will discuss surgical plan further with MD. Patient is a high risk surgical candidate and surgery may not resolve her symptoms.    LOS: 3 days    [facility] 10/13/2016, 7:53 AM Pager: [phone] Consults: [phone] Mon-Fri 7:00 am-4:30 pm Sat-Sun 7:00 am-11:30 am

## 2016-10-13 NOTE — Progress Notes (Signed)
K.Schorr, NP texted re: telemetry showing SB with rates as low as 36, SR with PAC's, AFib/flutter and BBB. None of which are sustaining. Patient sleeping but arousable, asymptomatic.

## 2016-10-13 NOTE — Progress Notes (Signed)
Progress Note  Patient Name: Alejandra [name] Date of Encounter: 10/13/2016  Primary Cardiologist: New  Subjective   Pt is doing well today. No pains and no concerns. Denies having any questions. She continues to be in atrial fibrillation/flutter on tele monitor  Inpatient Medications    Scheduled Meds: . insulin aspart  0-9 Units Subcutaneous TID WC  . mouth rinse  15 mL Mouth Rinse BID  . metoprolol  5 mg Intravenous Q4H  . ondansetron (ZOFRAN) IV  4 mg Intravenous Once  . pantoprazole (PROTONIX) IV  40 mg Intravenous Q24H  . sodium chloride flush  3 mL Intravenous Q12H  . tiotropium  18 mcg Inhalation Daily   Continuous Infusions: . dexrose 5 % and 0.45 % NaCl with KCl 30 mEq/L 75 mL/hr at 10/13/16 0900  . heparin 850 Units/hr (10/12/16 1623)   PRN Meds: sodium chloride, acetaminophen, albuterol, bisacodyl, hydrocortisone cream, iopamidol, metoprolol, morphine injection, ondansetron **OR** ondansetron (ZOFRAN) IV, sodium chloride flush   Vital Signs    Vitals:   10/13/16 0831 10/13/16 0851 10/13/16 0853 10/13/16 1002  BP: (!) 151/79   140/83  Pulse: 79 90  88  Resp:      Temp:  97.5 F (36.4 C)    TempSrc:  Axillary    SpO2:  100% 100%   Weight:      Height:        Intake/Output Summary (Last 24 hours) at 10/13/16 1519 Last data filed at 10/13/16 1007  Gross per 24 hour  Intake              685 ml  Output              220 ml  Net              465 ml   Filed Weights   10/10/16 2331 10/11/16 2356 10/12/16 2148  Weight: 157 lb 12.8 oz (71.6 kg) 151 lb 1.6 oz (68.5 kg) 151 lb (68.5 kg)    Telemetry    HR 70's Atrial Fibrillation/Flutter. Had one of nonsustained Vtach? 3-5 beats - Personally Reviewed  ECG    HR 69, sinus rhythm with marked sinus arrhythmia with PAC's, RBBB, left anterior fascicular block - Personally Reviewed  Physical Exam   General: Pleasant, NAD Psych: Normal affect. Neuro: Alert and oriented X 3. Moves all extremities  spontaneously. HEENT: Normal           Neck: Supple without bruits or JVD. Lungs:  Resp regular and unlabored, CTA except scattered rhonchi Heart: RRR no s3, s4, or murmurs. Abdomen: Soft, non-tender, non-distended, BS + x 4.  Extremities: No clubbing, cyanosis or edema. DP/PT/Radials 2+ and equal bilaterally.  Labs    Chemistry Recent Labs Lab 10/10/16 0530 10/11/16 0709 10/13/16 0638  NA 141 137 137  K 3.8 3.7 3.1*  CL 96* 96* 99*  CO2 34* 29 29  GLUCOSE 131* 150* 143*  BUN '14 10 14  '$ CREATININE 1.09* 1.00 1.08*  CALCIUM 9.0 8.6* 8.4*  PROT 7.9 7.0 6.4*  ALBUMIN 3.7 3.5 3.2*  AST '26 22 19  '$ ALT 16 14 13*  ALKPHOS 31* 30* 29*  BILITOT 0.8 0.6 0.7  GFRNONAA 46* 51* 46*  GFRAA 53* 59* 54*  ANIONGAP '11 12 9     '$ Hematology Recent Labs Lab 10/11/16 0709 10/12/16 0608 10/13/16 0638  WBC 3.9* 5.5 4.5  RBC 4.73 4.94 4.78  4.78  HGB 12.8 13.0 12.7  HCT 36.8 38.3 37.2  MCV 77.8* 77.5* 77.8*  MCH 27.1 26.3 26.6  MCHC 34.8 33.9 34.1  RDW 17.3* 16.9* 17.2*  PLT 190 207 198    BNP Recent Labs Lab 10/09/16 1310  BNP 79.4    Radiology    Nm Hepatobiliary Liver Func Result Date: 10/11/2016 Patent cystic duct. Nonvisualization of the gallbladder at 2 hours, which can be physiologic, due to prior narcotic administration, or secondary to CBD obstruction.    Mr 3d Recon At Scanner Result Date: 10/13/2016  1. Cholelithiasis without choledocholithiasis. The common duct distention has decreased since prior CT scan with both the common duct and common bile duct measuring normal diameter for patient age on today's study. No evidence for ampullary mass lesion or pancreatic head mass by MRI. There is no associated dilatation of the main pancreatic duct. 2. Status post endograft repair of abdominal aortic aneurysm. 3. Left colonic diverticulosis.  Mr Abdomen Mrcp Moise Boring Contast Result Date: 10/13/2016  1. Cholelithiasis without choledocholithiasis. The common duct distention has  decreased since prior CT scan with both the common duct and common bile duct measuring normal diameter for patient age on today's study. No evidence for ampullary mass lesion or pancreatic head mass by MRI. There is no associated dilatation of the main pancreatic duct. 2. Status post endograft repair of abdominal aortic aneurysm. 3. Left colonic diverticulosis.    Cardiac Studies   Echo results are pending  Patient Profile     Alejandra [name] is a 83 y.o. with past medical history significant for AAA s/p endovascular repair 2004, PE 2011 on coumadin, COPD on 2L O2, DM, HTN, and HLD who presented 3/24 with epigastric abdominal pain, nausea and vomiting. She has symptomatic cholelithiasis and GI is recommending possible lap cholecystectomy.   The patient has developed atrial fibrillation with no previous history of. Her Oral metoprolol is on hold while NPO and she has been receiving metoprolol mg IV q 6h which was just increased to 10 mg. She was in atrial fib with RVR with rates up to the 150's-170. She had no awareness of this, no palpitations, shortness of breath, lightheadedness.   Assessment & Plan    Atrial fibrillation --New onset on tele today, prior EKG showed NSR. Pt is asymptomatic and likely pain related.  --No previous MI or cardiac events. Has CVD risk factors of HTN, HLD, DM, age 83 --Echo results are pending --CHA2DS2-VASc Score is at least 6 (HTN, DM, Age (2), female, vascular). She is already on long term anticoagulation with coumadin for history of PE and has no abnormal bleeding. Currently on hold for possible surgery with heparin drip. --Atrial fib can be managed perioperatively. Lap chole would be acceptable procedure if needed. Keep pt on telemetry post op. Continue metoprolol 5 mg IV q 6h. Will start diltiazem drip and titrate as needed for continued afib with RVR.   AAA s/p endovascular repair 2004 --AAA s/p endograft repair was seen on CT chest/abd without significant  interval dilatation  Chronic anticoagulation --Hx PE in 2011, on coumadin which is currently on hold for possible surgery. IV Heparin per pharmacy protocol.  Cholelithiasis --Being managed by GI and surgery. --As her preoperative risk evaluation, she is an elderly woman with oxygen-dependent COPD who is likely do worse without surgery then with. She is 100% asymptomatic from a cardiac standpoint without any angina, heart failure or arrhythmia sensations. She does have this rapid arrhythmia which is being treated with beta blocker. Totally asymptomatic- Per Dr. Ellyn Hack. --Echo pending but not  likely to affect recommendation  HTN: Home meds include metoprolol 50 mg bid, Maxzide 37.5/25 mg daily which are being held  - Yesterday Dr. Ellyn Hack discussed his recommendations to limit the use of high doses of IV Beta Blocker in a tachycardic patient potentially borderline septic. He recommended Metoprolol 5 mg IV q 6hr with standing order of additional 5 mg IV q 6 hours PRN, and to titrat with a dilt drip for continued afib with RVR. -- Of note, medicine as dictated today that they would increase lopressor to '10mg'$  IV q6hr, given elevated readings and elevated HR, and expected ongoing NPO status and  consider hydralazine IV as needed if becomes uncontrolled. -- Currently neither of these medication suggestions are being reflected in the patients current medication regimen.  HLD -Last lipid panel in EPIC 06/2015: LDL 77, Trig 141 -Managed with lovastatin and Lovaza  Type 2 DM -Hemoglobin A1c 6.9 (10/09/2016) -Glipizide and metformin- off oral meds here and managed with SSI  Signed, GREENE,TIFFANY G, PA-C  10/13/2016, 3:19 PM    Patient seen, examined. Available data reviewed. Agree with findings, assessment, and plan as outlined by Delos Haring, PA-C. The patient is very pleasant, elderly woman in NAD. Lungs CTA, heart RRR without murmur, extremities with no edema. Plans noted for possible  cholecystectomy tomorrow.   Tele reviewed and shows sinus rhythm, sinus brady, and episodes of AF with RVR that are fairly transient. The patient is asymptomatic. Her echo is reviewed as below:  Echo: Study Conclusions  - Left ventricle: The cavity size was normal. Wall thickness was   increased in a pattern of mild LVH. Systolic function was normal.   The estimated ejection fraction was in the range of 55% to 60%.   Wall motion was normal; there were no regional wall motion   abnormalities. Doppler parameters are consistent with abnormal   left ventricular relaxation (grade 1 diastolic dysfunction). The   E/e&' ratio is between 8-15, suggesting indeterminate LV filling   pressure. - Left atrium: The atrium was normal in size. - Right ventricle: The cavity size was mildly dilated. Systolic   function was normal. - Right atrium: The atrium is severely dilated. - Tricuspid valve: There was moderate regurgitation. - Pulmonary arteries: PA peak pressure: 52 mm Hg (S). - Inferior vena cava: The vessel was normal in size. The   respirophasic diameter changes were in the normal range (>= 50%),   consistent with normal central venous pressure.  Impressions:  - LVEF 55-60%, mild LVH, normal wall motion, diastolic dysfunction,   indeterminate LV filling pressure, normal LA size, mildly dilated   RV with normal systolic function, severe RAE, moderate TR, RVSP   52 mmHg, normal IVC.  Would continue IV metoprolol as written. OK to proceed with surgery from cardiac perspective. Will follow with you.  Sherren Mocha, M.D. 10/13/2016 7:05 PM

## 2016-10-13 NOTE — Progress Notes (Addendum)
PROGRESS NOTE  Alejandra [name]  CBJ:[id number] DOB: 1932/03/21 DOA: 10/09/2016 PCP: Mauricio Po, FNP - [facility]  Brief Narrative: Alejandra [name] is an 83 y.o. female with a history of AAA s/p endovascular repair 2004, PE 2011 on coumadin, COPD on 2L O2, DM, HTN, and HLD who presented 3/24 with epigastric abdominal pain, nausea and vomiting. Symptoms were significantly improved with pain medications in ED. In the ED she was afebrile, BP 171/89, HR 77. WBC 7.7. Abd U/S showed mild gallbladder distention with echogenic stones without wall thickening or pericholecystic fluid. CBG 80m without ductal system mass/calculus. Subsequent CT abdomen showed similar findings with trace pericholecystic fluid concerning for acute cholecystitis. AAA was also seen s/p endograft repair without significant interval dilatation. CXR showed bibasilar atelectasis without infiltrate or edema. General surgery was consulted, recommending monitoring while holding coumadin (INR 2.2 on arrival).  Assessment & Plan: Principal Problem:   Acute cholecystitis Active Problems:   COPD (chronic obstructive pulmonary disease) ([name])   Dyslipidemia   CKD stage 3 secondary to diabetes ([name])   Anticoagulated on Coumadin   HTN (hypertension), malignant   Chronic respiratory failure with hypoxia ([name])  Symptomatic cholelithiasis: CT with gallbladder distention, radiopaque stones/ debris, new intrahepatic bile duct dilation, and trace pericholecystic fluid. CBD up to 163mfrom 4m47mNo leukocytosis, LFTs wnl so doubt CBD obstruction. HIDA scan abnormal with nonvisualization of gallbladder (physiologic [name] to NPO vs. analgesics vs. CBD obstruction).  - MRCP today per GI. NPO after midnight in case of need for lap chole per surgery. - Holding coumadin, monitoring INR daily. On IV heparin per pharmacy - Pain is subsided, so continuing prns only and will monitor on clear liquid diet - METS about 4. ECG unchanged RBBB from prior. No  anginal symptoms.    HTN: Elevated on admission, now normotensive. - Continue to hold metoprolol '50mg'$  BID and maxide 37.5 - '25mg'$  - continue lopressor to 5 mg IV q6hr - Would consider hydralazine IV as needed if becomes uncontrolled.  Pt with telemetric evidence of AFib. Not a known diagnosis PTA. HR down to 90's with administration of schedule metoprolol IV. STAT ECG shows unchanged RBBB with sinus rhythm. Asymptomatic. Recent TSH wnl. K wnl yesterday.  - Continue IV metoprolol as above - Heparin bridge while coumadin subtherapeutic (holding for possible surgery) - Echocardiogram pending - Cardiology consulted for new AFib and pre-operative clearance - see notes.   T2DM: Well-controlled, HbA1c 6.9%.  - Hold metformin - SSI  CBG (last 3)   Recent Labs  10/13/16 0039 10/13/16 0528 10/13/16 0740  GLUCAP 133* 127* 132*   Chronic anticoagulation with coumadin for hx of prior AAA repair 2004 and PE (dx 01/2010). INR 2.24 on admission, will allow to trend down by holding coumadin. No indication for reversal at this time.  - Holding ASA in preparation for surgery as well - Heparin bridging  Microcytosis: on labs 3/26 without anemia.  - Check iron panel with next blood draw.   Chronic hypoxemic respiratory failure [name] to COPD: No exacerbation is evident, stable on home 2L by [place]. CXR with atelectasis only.  - Continue home spiriva, nebulizers - Continue 2L O2 by [place].   GERD: Chronic, stable.  - IV PPI [name] to NPO  AAA: No significant growth since last surveillance 12/30/2014, though CT showed diffuse aortic disease with increasing diameter of the suprarenal aorta, now measuring 3.5 cm.  - Continued surveillance of suprarenal aorta is also recommended. - Continue statin - Holding ASA for now  DVT prophylaxis: SCD Code Status: Full Family Communication: None at bedside this AM Disposition Plan: MRCP today > NPO after MN for consideration of lap chole 3/28.    Consultants:    General surgery, Drs. Kieth Brightly, [facility], Dr. Watt Climes  Procedures:   None  Antimicrobials:  None   Subjective: Pt reporting less abdominal pain without N/V but had recently received pain meds.    Objective: Vitals:   10/13/16 0831 10/13/16 0851 10/13/16 0853 10/13/16 1002  BP: (!) 151/79   140/83  Pulse: 79 90  88  Resp:      Temp:  97.5 F (36.4 C)    TempSrc:  Axillary    SpO2:  100% 100%   Weight:      Height:        Intake/Output Summary (Last 24 hours) at 10/13/16 1139 Last data filed at 10/13/16 1007  Gross per 24 hour  Intake              925 ml  Output              221 ml  Net              704 ml   Filed Weights   10/10/16 2331 10/11/16 2356 10/12/16 2148  Weight: 71.6 kg (157 lb 12.8 oz) 68.5 kg (151 lb 1.6 oz) 68.5 kg (151 lb)    Examination: General exam: Elderly female in no distress. Cooperative. Alert. Oriented.  Respiratory system: Non-labored breathing 2L by [place]. Diminished but clear to auscultation bilaterally.  Cardiovascular system: Regular rate and rhythm. No murmur, rub, or gallop. No JVD, and no pedal edema. Gastrointestinal system: Abdomen soft, mild lower abdominal tenderness without rebound. Non-distended, with normoactive bowel sounds. No organomegaly or masses felt. Central nervous system: Alert and oriented. No focal neurological deficits. Extremities: Warm, no deformities Skin: No rashes, lesions no ulcers Psychiatry: Judgement and insight appear normal. Mood & affect appropriate.   Data Reviewed: I have personally reviewed following labs and imaging studies  CBC:  Recent Labs Lab 10/09/16 0455 10/10/16 0530 10/11/16 0709 10/12/16 0608 10/13/16 0638  WBC 10.2 7.7 3.9* 5.5 4.5  HGB 12.1 12.8 12.8 13.0 12.7  HCT 36.0 37.4 36.8 38.3 37.2  MCV 78.1 78.4 77.8* 77.5* 77.8*  PLT 250 222 190 207 517   Basic Metabolic Panel:  Recent Labs Lab 10/09/16 0455 10/10/16 0530 10/11/16 0709 10/13/16 0638  NA  140 141 137 137  K 3.5 3.8 3.7 3.1*  CL 98* 96* 96* 99*  CO2 30 34* 29 29  GLUCOSE 224* 131* 150* 143*  BUN '19 14 10 14  '$ CREATININE 1.05* 1.09* 1.00 1.08*  CALCIUM 9.2 9.0 8.6* 8.4*   GFR: Estimated Creatinine Clearance: 39.1 mL/min (A) (by C-G formula based on SCr of 1.08 mg/dL (H)). Liver Function Tests:  Recent Labs Lab 10/09/16 0455 10/10/16 0530 10/11/16 0709 10/13/16 0638  AST '25 26 22 19  '$ ALT '17 16 14 '$ 13*  ALKPHOS 32* 31* 30* 29*  BILITOT 0.6 0.8 0.6 0.7  PROT 7.5 7.9 7.0 6.4*  ALBUMIN 3.6 3.7 3.5 3.2*    Recent Labs Lab 10/09/16 0455  LIPASE 23   No results for input(s): AMMONIA in the last 168 hours. Coagulation Profile:  Recent Labs Lab 10/09/16 1310 10/10/16 0530 10/11/16 0709 10/12/16 0608 10/13/16 0638  INR 2.24 2.19 2.18 1.80 1.64   Cardiac Enzymes: No results for input(s): CKTOTAL, CKMB, CKMBINDEX, TROPONINI in the last 168 hours. BNP (  last 3 results) No results for input(s): PROBNP in the last 8760 hours. HbA1C: No results for input(s): HGBA1C in the last 72 hours. CBG:  Recent Labs Lab 10/12/16 1706 10/12/16 2152 10/13/16 0039 10/13/16 0528 10/13/16 0740  GLUCAP 106* 128* 133* 127* 132*   Lipid Profile: No results for input(s): CHOL, HDL, LDLCALC, TRIG, CHOLHDL, LDLDIRECT in the last 72 hours. Thyroid Function Tests: No results for input(s): TSH, T4TOTAL, FREET4, T3FREE, THYROIDAB in the last 72 hours. Anemia Panel:  Recent Labs  10/13/16 0638  VITAMINB12 1,159*  FOLATE 36.5  FERRITIN 22  TIBC 353  IRON 58  RETICCTPCT 1.4   Urine analysis:    Component Value Date/Time   COLORURINE YELLOW 10/09/2016 0501   APPEARANCEUR CLEAR 10/09/2016 0501   LABSPEC 1.021 10/09/2016 0501   PHURINE 5.0 10/09/2016 0501   GLUCOSEU 50 (A) 10/09/2016 0501   HGBUR NEGATIVE 10/09/2016 0501   BILIRUBINUR NEGATIVE 10/09/2016 0501   KETONESUR NEGATIVE 10/09/2016 0501   PROTEINUR 100 (A) 10/09/2016 0501   UROBILINOGEN 0.2 10/29/2011 1414    NITRITE NEGATIVE 10/09/2016 0501   LEUKOCYTESUR LARGE (A) 10/09/2016 0501   Recent Results (from the past 240 hour(s))  Urine culture     Status: Abnormal   Collection Time: 10/09/16  5:33 AM  Result Value Ref Range Status   Specimen Description URINE, RANDOM  Final   Special Requests NONE  Final   Culture (A)  Final    30,000 COLONIES/mL STREPTOCOCCUS AGALACTIAE TESTING AGAINST S. AGALACTIAE NOT ROUTINELY PERFORMED [name] TO PREDICTABILITY OF AMP/PEN/VAN SUSCEPTIBILITY.    Report Status 10/10/2016 FINAL  Final      Radiology Studies: Nm Hepatobiliary Liver Func  Result Date: 10/11/2016 CLINICAL DATA:  Nausea, vomiting, and abdominal pain since 10/08/2016, suspected cholecystitis by CT EXAM: NUCLEAR MEDICINE HEPATOBILIARY IMAGING TECHNIQUE: Sequential images of the abdomen were obtained out to 60 minutes following intravenous administration of radiopharmaceutical. RADIOPHARMACEUTICALS:  5.29 mCi Tc-44mCholetec IV COMPARISON:  CT abdomen and pelvis 10/09/2016 FINDINGS: Normal tracer extraction from bloodstream indicating normal hepatocellular function. Prompt concentration and excretion of tracer into the central biliary tree. Gallbladder visualized at 40 minutes. At 1 hour, bowel had not visualized. Additional imaging over on additional power showed no small bowel visualization. This can be seen as a physiologic finding in patients who are NPO without stimulus for gallbladder contraction or relaxation of the sphincter of Oddi, in patients receiving narcotics, and with CBD obstruction. IMPRESSION: Patent cystic duct. Nonvisualization of the gallbladder at 2 hours, which can be physiologic, [name] to prior narcotic administration, or secondary to CBD obstruction. Electronically Signed   By: MLavonia DanaM.D.   On: 10/11/2016 18:24   Mr 3d Recon At Scanner  Result Date: 10/13/2016 CLINICAL DATA:  Nausea and vomiting with abdominal pain. Suspected cholecystitis by CT. Nuclear medicine study revealed  patent cystic duct with nonvisualization of gut activity. EXAM: MRI ABDOMEN WITHOUT AND WITH CONTRAST (INCLUDING MRCP) TECHNIQUE: Multiplanar multisequence MR imaging of the abdomen was performed both before and after the administration of intravenous contrast. Heavily T2-weighted images of the biliary and pancreatic ducts were obtained, and three-dimensional MRCP images were rendered by post processing. CONTRAST:  169mMULTIHANCE GADOBENATE DIMEGLUMINE 529 MG/ML IV SOLN COMPARISON:  Nuclear medicine study 10/11/2016.  CT scan 10/09/2016. FINDINGS: Lower chest:  Heart is enlarged. Hepatobiliary: The liver unremarkable. Gallbladder is distended. Tiny layering stones are identified in the gallbladder lumen. No substantial intrahepatic biliary duct dilatation. Extrahepatic common duct measures 8 mm diameter which is  upper normal for patient age. Common bile duct in the head of the pancreas is 6 mm, within normal limits for age. There is no filling defect in the common duct or common bile duct to suggest choledocholithiasis. Pancreas: No focal mass lesion. No dilatation of the main duct. No intraparenchymal cyst. No peripancreatic edema. No evidence for pancreas divisum Spleen: No splenomegaly. No focal mass lesion. Adrenals/Urinary Tract: Left adrenal thickening noted without nodule. Right adrenal gland unremarkable. Cortical scarring noted both kidneys no hydronephrosis. No enhancing mass lesion. Stomach/Bowel: No evidence for dilated small bowel. Diverticular changes are noted in the left colon. Vascular/Lymphatic: Patient is status post aortic endograft placement for infrarenal aneurysm. There is no gastrohepatic or hepatoduodenal ligament lymphadenopathy. No intraperitoneal or retroperitoneal lymphadenopathy. Other: No intraperitoneal free fluid. Musculoskeletal: No abnormal marrow enhancement within the visualized bony anatomy. IMPRESSION: 1. Cholelithiasis without choledocholithiasis. The common duct distention  has decreased since prior CT scan with both the common duct and common bile duct measuring normal diameter for patient age on today's study. No evidence for ampullary mass lesion or pancreatic head mass by MRI. There is no associated dilatation of the main pancreatic duct. 2. Status post endograft repair of abdominal aortic aneurysm. 3. Left colonic diverticulosis. Electronically Signed   By: Misty Stanley M.D.   On: 10/13/2016 07:41   Mr Abdomen Mrcp Moise Boring Contast  Result Date: 10/13/2016 CLINICAL DATA:  Nausea and vomiting with abdominal pain. Suspected cholecystitis by CT. Nuclear medicine study revealed patent cystic duct with nonvisualization of gut activity. EXAM: MRI ABDOMEN WITHOUT AND WITH CONTRAST (INCLUDING MRCP) TECHNIQUE: Multiplanar multisequence MR imaging of the abdomen was performed both before and after the administration of intravenous contrast. Heavily T2-weighted images of the biliary and pancreatic ducts were obtained, and three-dimensional MRCP images were rendered by post processing. CONTRAST:  17m MULTIHANCE GADOBENATE DIMEGLUMINE 529 MG/ML IV SOLN COMPARISON:  Nuclear medicine study 10/11/2016.  CT scan 10/09/2016. FINDINGS: Lower chest:  Heart is enlarged. Hepatobiliary: The liver unremarkable. Gallbladder is distended. Tiny layering stones are identified in the gallbladder lumen. No substantial intrahepatic biliary duct dilatation. Extrahepatic common duct measures 8 mm diameter which is upper normal for patient age. Common bile duct in the head of the pancreas is 6 mm, within normal limits for age. There is no filling defect in the common duct or common bile duct to suggest choledocholithiasis. Pancreas: No focal mass lesion. No dilatation of the main duct. No intraparenchymal cyst. No peripancreatic edema. No evidence for pancreas divisum Spleen: No splenomegaly. No focal mass lesion. Adrenals/Urinary Tract: Left adrenal thickening noted without nodule. Right adrenal gland  unremarkable. Cortical scarring noted both kidneys no hydronephrosis. No enhancing mass lesion. Stomach/Bowel: No evidence for dilated small bowel. Diverticular changes are noted in the left colon. Vascular/Lymphatic: Patient is status post aortic endograft placement for infrarenal aneurysm. There is no gastrohepatic or hepatoduodenal ligament lymphadenopathy. No intraperitoneal or retroperitoneal lymphadenopathy. Other: No intraperitoneal free fluid. Musculoskeletal: No abnormal marrow enhancement within the visualized bony anatomy. IMPRESSION: 1. Cholelithiasis without choledocholithiasis. The common duct distention has decreased since prior CT scan with both the common duct and common bile duct measuring normal diameter for patient age on today's study. No evidence for ampullary mass lesion or pancreatic head mass by MRI. There is no associated dilatation of the main pancreatic duct. 2. Status post endograft repair of abdominal aortic aneurysm. 3. Left colonic diverticulosis. Electronically Signed   By: EMisty StanleyM.D.   On: 10/13/2016 07:41   Scheduled  Meds: . insulin aspart  0-9 Units Subcutaneous TID WC  . mouth rinse  15 mL Mouth Rinse BID  . metoprolol  5 mg Intravenous Q4H  . ondansetron (ZOFRAN) IV  4 mg Intravenous Once  . pantoprazole (PROTONIX) IV  40 mg Intravenous Q24H  . sodium chloride flush  3 mL Intravenous Q12H  . tiotropium  18 mcg Inhalation Daily   Continuous Infusions: . dexrose 5 % and 0.45 % NaCl with KCl 30 mEq/L 75 mL/hr at 10/13/16 0900  . heparin 850 Units/hr (10/12/16 1623)     LOS: 3 days   Time spent: 25 minutes.  Irwin Brakeman, MD [facility] Pager [phone]  If 7PM-7AM, please contact night-coverage [email or web address] Password [facility] 10/13/2016, 11:39 AM

## 2016-10-13 NOTE — Progress Notes (Signed)
  Echocardiogram 2D Echocardiogram has been performed.  Alejandra [name] M 10/13/2016, 3:20 PM

## 2016-10-13 NOTE — Progress Notes (Signed)
ANTICOAGULATION CONSULT NOTE - Follow Up Consult  Pharmacy Consult for Heparin  Indication: Hx PE/hypercoagulable state  Allergies  Allergen Reactions  . Penicillins Nausea And Vomiting, Rash and Other (See Comments)    Childhood reaction   Patient Measurements: Height: '5\' 5"'$  (165.1 cm) Weight: 151 lb (68.5 kg) IBW/kg (Calculated) : 57  Vital Signs: Temp: 97.9 F (36.6 C) (03/28 0530) BP: 151/79 (03/28 0831) Pulse Rate: 79 (03/28 0831)  Labs:  Recent Labs  10/11/16 0709 10/12/16 0608 10/12/16 2223 10/13/16 0638  HGB 12.8 13.0  --  12.7  HCT 36.8 38.3  --  37.2  PLT 190 207  --  198  LABPROT 24.7* 21.1*  --  19.6*  INR 2.18 1.80  --  1.64  HEPARINUNFRC  --   --  0.33 0.53  CREATININE 1.00  --   --  1.08*    Estimated Creatinine Clearance: 39.1 mL/min (A) (by C-G formula based on SCr of 1.08 mg/dL (H)).   Assessment: 65 YOF on warfarin PTA for history of hypercoagulable state and PE. Warfarin on hold in anticipation of GI surgery. With new AFib- started on IV heparin as bridge. INR down to 1.64 this morning, heparin level remains therapeutic at 0.53 units/mL.  Hgb and platelets within normal limits. No bleeding noted.  No established surgical plans yet.  Goal of Therapy:  Heparin level 0.3-0.7 units/ml Monitor platelets by anticoagulation protocol: Yes   Plan:  -Heparin infusion at 850 units/hr -Daily heparin level and CBC -Follow for surgical plans  Bartlett Shellhammer D. Caresse Sedivy, PharmD, BCPS Clinical Pharmacist Pager: [phone] 10/13/2016 8:41 AM

## 2016-10-13 NOTE — Progress Notes (Signed)
Fredda P Bergland 2:32 PM  Subjective: Patient doing fine and no problems with clear liquids and what seems to need to be left upper quadrant pain seems to come and go and it is not there now and she had 2 small bowel movements yesterday but none today and no other complaints  Objective: Vital signs stable afebrile no acute distress abdomen is soft nontender occasional bowel sounds labs okay liver tests normal white count okay MRCP without CBD stones  Assessment: Improved acute cholecystitis  Plan: We'll allow soft solids today but continue nothing by mouth after midnight in case laparoscopic cholecystectomy to be done and please call me if I could be of any further assistance with this hospital stay  [facility]  Pager [phone] After 5PM or if no answer call [phone]

## 2016-10-13 NOTE — Significant Event (Signed)
Rapid Response Event Note  Called by Rodena Piety, RN with concerns over pts variable cardiac rate and rythms  Overview: Time Called: 0033 Arrival Time: 0035 Event Type: Cardiac  Initial Focused Assessment: Throughout the evening pt has had telemetry readings showing SB as low as 36, SR with PACs, Afib  Into the 30s, and BBB. Tachy/brady rhythms non sustaining.  Pt denies CP, SOB, is sleepy but arousable.  Bilateral BS diminished throughout.   97.8  157/67  RR 13 99% O2 ,  sats on 2l [place]   Interventions: 12 lead EKG: SR rate 69 with marked sinus arrhythmia , PAC and RBBB  Plan of Care (if not transferred): Hand off to [place], Continue to monitor pt, call central tele to notify  if pt sustains any dysrthmia over 10 minutes, call if pt becomes symptomatic with CP, SOB ,AMS, hypotension orany other concerns.  Event Summary: Name of Physician Notified: Chaney Malling, NP at [phone]    at    Outcome: Stayed in room and stabalized  Event End Time: [place]

## 2016-10-14 ENCOUNTER — Encounter (HOSPITAL_COMMUNITY): Payer: Self-pay | Admitting: Certified Registered Nurse Anesthetist

## 2016-10-14 ENCOUNTER — Inpatient Hospital Stay (HOSPITAL_COMMUNITY): Payer: Medicare HMO | Admitting: Certified Registered Nurse Anesthetist

## 2016-10-14 ENCOUNTER — Encounter (HOSPITAL_COMMUNITY)
Admission: EM | Disposition: A | Discharge status: Home Health | Payer: Self-pay | Source: Home / Self Care | Attending: Family Medicine

## 2016-10-14 DIAGNOSIS — I482 Chronic atrial fibrillation, unspecified: Secondary | ICD-10-CM

## 2016-10-14 HISTORY — PX: CHOLECYSTECTOMY: SHX55

## 2016-10-14 LAB — PROTIME-INR
INR: 1.3
PROTHROMBIN TIME: 16.3 s — AB (ref 11.4–15.2)

## 2016-10-14 LAB — COMPREHENSIVE METABOLIC PANEL
ALK PHOS: 26 U/L — AB (ref 38–126)
ALT: 12 U/L — AB (ref 14–54)
ANION GAP: 7 (ref 5–15)
AST: 20 U/L (ref 15–41)
Albumin: 2.9 g/dL — ABNORMAL LOW (ref 3.5–5.0)
BUN: 8 mg/dL (ref 6–20)
CALCIUM: 8 mg/dL — AB (ref 8.9–10.3)
CHLORIDE: 102 mmol/L (ref 101–111)
CO2: 29 mmol/L (ref 22–32)
CREATININE: 1 mg/dL (ref 0.44–1.00)
GFR, EST AFRICAN AMERICAN: 59 mL/min — AB (ref >60)
GFR, EST NON AFRICAN AMERICAN: 51 mL/min — AB (ref >60)
Glucose, Bld: 145 mg/dL — ABNORMAL HIGH (ref 65–99)
Potassium: 3.8 mmol/L (ref 3.5–5.1)
Sodium: 138 mmol/L (ref 135–145)
Total Bilirubin: 0.7 mg/dL (ref 0.3–1.2)
Total Protein: 6 g/dL — ABNORMAL LOW (ref 6.5–8.1)

## 2016-10-14 LAB — CBC
HCT: 34.7 % — ABNORMAL LOW (ref 36.0–46.0)
Hemoglobin: 11.8 g/dL — ABNORMAL LOW (ref 12.0–15.0)
MCH: 26.2 pg (ref 26.0–34.0)
MCHC: 34 g/dL (ref 30.0–36.0)
MCV: 76.9 fL — AB (ref 78.0–100.0)
PLATELETS: 191 10*3/uL (ref 150–400)
RBC: 4.51 MIL/uL (ref 3.87–5.11)
RDW: 16.5 % — ABNORMAL HIGH (ref 11.5–15.5)
WBC: 4 10*3/uL (ref 4.0–10.5)

## 2016-10-14 LAB — GLUCOSE, CAPILLARY
GLUCOSE-CAPILLARY: 160 mg/dL — AB (ref 65–99)
Glucose-Capillary: 180 mg/dL — ABNORMAL HIGH (ref 65–99)
Glucose-Capillary: 136 mg/dL — ABNORMAL HIGH (ref 65–99)
Glucose-Capillary: 137 mg/dL — ABNORMAL HIGH (ref 65–99)
Glucose-Capillary: 205 mg/dL — ABNORMAL HIGH (ref 65–99)

## 2016-10-14 LAB — MAGNESIUM: Magnesium: 1.4 mg/dL — ABNORMAL LOW (ref 1.7–2.4)

## 2016-10-14 LAB — SURGICAL PCR SCREEN
MRSA, PCR: NEGATIVE
Staphylococcus aureus: NEGATIVE

## 2016-10-14 SURGERY — LAPAROSCOPIC CHOLECYSTECTOMY
Anesthesia: General | Site: Abdomen

## 2016-10-14 MED ORDER — VANCOMYCIN HCL IN DEXTROSE 1-5 GM/200ML-% IV SOLN
1000.0000 mg | INTRAVENOUS | Status: AC
  Administered 2016-10-14: 1000 mg via INTRAVENOUS
  Filled 2016-10-14: qty 200

## 2016-10-14 MED ORDER — HEPARIN (PORCINE) IN NACL 100-0.45 UNIT/ML-% IJ SOLN
850.0000 [IU]/h | INTRAMUSCULAR | Status: AC
  Administered 2016-10-14: 850 [IU]/h via INTRAVENOUS
  Filled 2016-10-14: qty 250

## 2016-10-14 MED ORDER — FENTANYL CITRATE (PF) 250 MCG/5ML IJ SOLN
INTRAMUSCULAR | Status: AC
Start: 2016-10-14 — End: 2016-10-14
  Filled 2016-10-14: qty 5

## 2016-10-14 MED ORDER — KETOROLAC TROMETHAMINE 30 MG/ML IJ SOLN: 15.0000 mg | Freq: Once | INTRAMUSCULAR | Status: DC | PRN

## 2016-10-14 MED ORDER — ROCURONIUM BROMIDE 10 MG/ML (PF) SYRINGE
PREFILLED_SYRINGE | INTRAVENOUS | Status: DC | PRN
  Administered 2016-10-14: 40 mg via INTRAVENOUS

## 2016-10-14 MED ORDER — SODIUM CHLORIDE 0.9 % IR SOLN
Status: DC | PRN
  Administered 2016-10-14: 1

## 2016-10-14 MED ORDER — PROPOFOL 10 MG/ML IV BOLUS
INTRAVENOUS | Status: AC
  Filled 2016-10-14: qty 20

## 2016-10-14 MED ORDER — LIDOCAINE 2% (20 MG/ML) 5 ML SYRINGE
INTRAMUSCULAR | Status: DC | PRN
  Administered 2016-10-14: 60 mg via INTRAVENOUS

## 2016-10-14 MED ORDER — DOCUSATE SODIUM 100 MG PO CAPS
100.0000 mg | ORAL_CAPSULE | Freq: Two times a day (BID) | ORAL | Status: DC
  Administered 2016-10-14 – 2016-10-16 (×5): 100 mg via ORAL
  Filled 2016-10-14 (×5): qty 1

## 2016-10-14 MED ORDER — SUGAMMADEX SODIUM 200 MG/2ML IV SOLN
INTRAVENOUS | Status: DC | PRN
  Administered 2016-10-14: 200 mg via INTRAVENOUS

## 2016-10-14 MED ORDER — PROPOFOL 10 MG/ML IV BOLUS
INTRAVENOUS | Status: DC | PRN
  Administered 2016-10-14: 100 mg via INTRAVENOUS

## 2016-10-14 MED ORDER — FENTANYL CITRATE (PF) 100 MCG/2ML IJ SOLN
INTRAMUSCULAR | Status: AC
Start: 2016-10-14 — End: 2016-10-15
  Filled 2016-10-14: qty 2

## 2016-10-14 MED ORDER — 0.9 % SODIUM CHLORIDE (POUR BTL) OPTIME
TOPICAL | Status: DC | PRN
  Administered 2016-10-14: 1000 mL

## 2016-10-14 MED ORDER — TRAMADOL HCL 50 MG PO TABS
50.0000 mg | ORAL_TABLET | Freq: Four times a day (QID) | ORAL | Status: DC | PRN
  Administered 2016-10-14 – 2016-10-16 (×4): 50 mg via ORAL
  Filled 2016-10-14 (×3): qty 1

## 2016-10-14 MED ORDER — BUPIVACAINE HCL (PF) 0.25 % IJ SOLN
INTRAMUSCULAR | Status: AC
  Filled 2016-10-14: qty 30

## 2016-10-14 MED ORDER — LACTATED RINGERS IV SOLN
INTRAVENOUS | Status: DC
  Administered 2016-10-14: 11:00:00 via INTRAVENOUS

## 2016-10-14 MED ORDER — DEXAMETHASONE SODIUM PHOSPHATE 10 MG/ML IJ SOLN
INTRAMUSCULAR | Status: DC | PRN
  Administered 2016-10-14: 5 mg via INTRAVENOUS

## 2016-10-14 MED ORDER — BUPIVACAINE-EPINEPHRINE 0.25% -1:200000 IJ SOLN
INTRAMUSCULAR | Status: DC | PRN
  Administered 2016-10-14: 18 mL

## 2016-10-14 MED ORDER — SUGAMMADEX SODIUM 200 MG/2ML IV SOLN
INTRAVENOUS | Status: AC
  Filled 2016-10-14: qty 2

## 2016-10-14 MED ORDER — FENTANYL CITRATE (PF) 100 MCG/2ML IJ SOLN
INTRAMUSCULAR | Status: DC | PRN
  Administered 2016-10-14 (×2): 50 ug via INTRAVENOUS

## 2016-10-14 MED ORDER — PROMETHAZINE HCL 25 MG/ML IJ SOLN: 6.2500 mg | INTRAMUSCULAR | Status: DC | PRN

## 2016-10-14 MED ORDER — TRAMADOL HCL 50 MG PO TABS
ORAL_TABLET | ORAL | Status: AC
  Filled 2016-10-14: qty 1

## 2016-10-14 MED ORDER — SENNA 8.6 MG PO TABS
1.0000 | ORAL_TABLET | Freq: Every day | ORAL | Status: DC
  Administered 2016-10-14 – 2016-10-16 (×3): 8.6 mg via ORAL
  Filled 2016-10-14 (×3): qty 1

## 2016-10-14 MED ORDER — PHENYLEPHRINE 40 MCG/ML (10ML) SYRINGE FOR IV PUSH (FOR BLOOD PRESSURE SUPPORT)
PREFILLED_SYRINGE | INTRAVENOUS | Status: DC | PRN
  Administered 2016-10-14: 80 ug via INTRAVENOUS
  Administered 2016-10-14 (×2): 40 ug via INTRAVENOUS

## 2016-10-14 MED ORDER — MIDAZOLAM HCL 2 MG/2ML IJ SOLN
INTRAMUSCULAR | Status: AC
  Filled 2016-10-14: qty 2

## 2016-10-14 MED ORDER — FENTANYL CITRATE (PF) 100 MCG/2ML IJ SOLN
25.0000 ug | INTRAMUSCULAR | Status: DC | PRN
  Administered 2016-10-14: 25 ug via INTRAVENOUS
  Administered 2016-10-14: 50 ug via INTRAVENOUS
  Administered 2016-10-14: 25 ug via INTRAVENOUS

## 2016-10-14 SURGICAL SUPPLY — 37 items
ADH SKN CLS APL DERMABOND .7 (GAUZE/BANDAGES/DRESSINGS) ×1
APPLIER CLIP 5 13 M/L LIGAMAX5 (MISCELLANEOUS) ×2
APR CLP MED LRG 5 ANG JAW (MISCELLANEOUS) ×1
BAG SPEC RTRVL LRG 6X4 10 (ENDOMECHANICALS) ×1
BLADE CLIPPER SURG (BLADE) IMPLANT
CANISTER SUCT 3000ML PPV (MISCELLANEOUS) ×2 IMPLANT
CHLORAPREP W/TINT 26ML (MISCELLANEOUS) ×2 IMPLANT
CLIP APPLIE 5 13 M/L LIGAMAX5 (MISCELLANEOUS) ×1 IMPLANT
COVER SURGICAL LIGHT HANDLE (MISCELLANEOUS) ×2 IMPLANT
DERMABOND ADVANCED (GAUZE/BANDAGES/DRESSINGS) ×1
DERMABOND ADVANCED .7 DNX12 (GAUZE/BANDAGES/DRESSINGS) ×1 IMPLANT
ELECT REM PT RETURN 9FT ADLT (ELECTROSURGICAL) ×2
ELECTRODE REM PT RTRN 9FT ADLT (ELECTROSURGICAL) ×1 IMPLANT
GLOVE BIO SURGEON STRL SZ 6 (GLOVE) ×2 IMPLANT
GLOVE BIOGEL PI IND STRL 6.5 (GLOVE) ×1 IMPLANT
GLOVE BIOGEL PI INDICATOR 6.5 (GLOVE) ×1
GOWN STRL REUS W/ TWL LRG LVL3 (GOWN DISPOSABLE) ×3 IMPLANT
GOWN STRL REUS W/TWL LRG LVL3 (GOWN DISPOSABLE) ×6
GRASPER SUT TROCAR 14GX15 (MISCELLANEOUS) ×2 IMPLANT
KIT BASIN OR (CUSTOM PROCEDURE TRAY) ×2 IMPLANT
KIT ROOM TURNOVER OR (KITS) ×2 IMPLANT
NDL INSUFFLATION 14GA 120MM (NEEDLE) ×1 IMPLANT
NEEDLE INSUFFLATION 14GA 120MM (NEEDLE) ×2 IMPLANT
NS IRRIG 1000ML POUR BTL (IV SOLUTION) ×2 IMPLANT
PAD ARMBOARD 7.5X6 YLW CONV (MISCELLANEOUS) ×2 IMPLANT
POUCH SPECIMEN RETRIEVAL 10MM (ENDOMECHANICALS) ×2 IMPLANT
SCISSORS LAP 5X35 DISP (ENDOMECHANICALS) ×2 IMPLANT
SET IRRIG TUBING LAPAROSCOPIC (IRRIGATION / IRRIGATOR) ×2 IMPLANT
SLEEVE ENDOPATH XCEL 5M (ENDOMECHANICALS) ×4 IMPLANT
SPECIMEN JAR SMALL (MISCELLANEOUS) ×2 IMPLANT
SUT MNCRL AB 4-0 PS2 18 (SUTURE) ×2 IMPLANT
TOWEL OR 17X24 6PK STRL BLUE (TOWEL DISPOSABLE) ×2 IMPLANT
TOWEL OR 17X26 10 PK STRL BLUE (TOWEL DISPOSABLE) IMPLANT
TRAY LAPAROSCOPIC MC (CUSTOM PROCEDURE TRAY) ×2 IMPLANT
TROCAR XCEL NON-BLD 11X100MML (ENDOMECHANICALS) ×2 IMPLANT
TROCAR XCEL NON-BLD 5MMX100MML (ENDOMECHANICALS) ×2 IMPLANT
TUBING INSUFFLATION (TUBING) ×2 IMPLANT

## 2016-10-14 NOTE — Progress Notes (Signed)
PROGRESS NOTE  Alejandra [name]  RJJ:[id number] DOB: August 28, 1931 DOA: 10/09/2016 PCP: Mauricio Po, FNP - [place]  Brief Narrative: Alejandra [name] is an 83 y.o. female with a history of AAA s/p endovascular repair 2004, PE 2011 on coumadin, COPD on 2L O2, DM, HTN, and HLD who presented 3/24 with epigastric abdominal pain, nausea and vomiting. Symptoms were significantly improved with pain medications in ED. In the ED she was afebrile, BP 171/89, HR 77. WBC 7.7. Abd U/S showed mild gallbladder distention with echogenic stones without wall thickening or pericholecystic fluid. CBG 347m without ductal system mass/calculus. Subsequent CT abdomen showed similar findings with trace pericholecystic fluid concerning for acute cholecystitis. AAA was also seen s/p endograft repair without significant interval dilatation. CXR showed bibasilar atelectasis without infiltrate or edema. General surgery was consulted, recommending monitoring while holding coumadin (INR 2.2 on arrival).  Assessment & Plan: Principal Problem:   Acute cholecystitis Active Problems:   COPD (chronic obstructive pulmonary disease) ([name])   Dyslipidemia   CKD stage 3 secondary to diabetes ([name])   Anticoagulated on Coumadin   HTN (hypertension), malignant   Chronic respiratory failure with hypoxia ([name])  Symptomatic cholelithiasis: CT with gallbladder distention, radiopaque stones/ debris, new intrahepatic bile duct dilation, and trace pericholecystic fluid. CBD up to 130mfrom 47m62mNo leukocytosis, LFTs wnl so doubt CBD obstruction. HIDA scan abnormal with nonvisualization of gallbladder (physiologic due to NPO vs. analgesics vs. CBD obstruction).  Pt going to OR 3/29 for lap chole.  - MRCP today per GI. NPO after midnight in case of need for lap chole 3/29. - Holding coumadin, monitoring INR daily. On IV heparin per pharmacy - Pain is subsided, so continuing prns only and will monitor on clear liquid diet - METS about 4. ECG  unchanged RBBB from prior. No anginal symptoms.    HTN: Elevated on admission, now normotensive. - Continue to hold metoprolol '50mg'$  BID and maxide 37.5 - '25mg'$  - lopressor to 5 mg IV q6hr - Would consider hydralazine IV as needed if becomes uncontrolled.  Pt with telemetric evidence of AFib. Not a known diagnosis PTA. HR down to 90's with administration of schedule metoprolol IV. STAT ECG shows unchanged RBBB with sinus rhythm. Asymptomatic. Recent TSH wnl. K wnl yesterday.  - Continue IV metoprolol as above - Heparin bridge while coumadin subtherapeutic (holding for possible surgery) - Echocardiogram completed: LVEF 55-60%, mild LVH, normal wall motion, diastolic dysfunction,  indeterminate LV filling pressure, normal LA size, mildly dilated RV with normal systolic function, severe RAE, moderate TR, RVSP 52 mmHg, normal IVC - Cardiology consulted for new AFib and pre-operative clearance - see notes.   T2DM: Well-controlled, HbA1c 6.9%.  - Hold metformin - SSI  CBG (last 3)   Recent Labs  10/13/16 1639 10/13/16 2150 10/14/16 0802  GLUCAP 100* 180* 136*   Chronic anticoagulation with coumadin for hx of prior AAA repair 2004 and PE (dx 01/2010). INR 2.24 on admission, will allow to trend down by holding coumadin. No indication for reversal at this time.  - Holding ASA in preparation for surgery as well - Heparin bridging  Microcytosis: on labs 3/26 without anemia.   Chronic hypoxemic respiratory failure due to COPD: No exacerbation is evident, stable on home 2L by [place]. CXR with atelectasis only.  - Continue home spiriva, nebulizers - Continue 2L O2 by [place].   GERD: Chronic, stable.  - IV PPI due to NPO  AAA: No significant growth since last surveillance 12/30/2014, though CT showed diffuse  aortic disease with increasing diameter of the suprarenal aorta, now measuring 3.5 cm.  - Continued surveillance of suprarenal aorta is also recommended. - Continue statin - Holding ASA for  now  DVT prophylaxis: SCD Code Status: Full Family Communication: None at bedside this AM Disposition Plan: lap chole planned 3/29    Consultants:   General surgery, Drs. Kieth Brightly, [facility], Dr. Watt Climes  Procedures:   None  Antimicrobials:  None   Subjective: Pt says she feels much better today, seen just before going to OR   Objective: Vitals:   10/13/16 2320 10/14/16 0546 10/14/16 0930 10/14/16 0939  BP: (!) 145/80 (!) 144/73 126/72 130/74  Pulse: 79 74 67 65  Resp:  '15 14 15  '$ Temp:  98.2 F (36.8 C) 98.2 F (36.8 C) 98 F (36.7 C)  TempSrc:   Oral Oral  SpO2:  100% 99% 99%  Weight:      Height:        Intake/Output Summary (Last 24 hours) at 10/14/16 1017 Last data filed at 10/14/16 0600  Gross per 24 hour  Intake          1797.28 ml  Output              450 ml  Net          1347.28 ml   Filed Weights   10/11/16 2356 10/12/16 2148 10/13/16 2100  Weight: 68.5 kg (151 lb 1.6 oz) 68.5 kg (151 lb) 68 kg (150 lb)    Examination: General exam: Elderly female in no distress. Cooperative. Alert. Oriented.  Respiratory system: Non-labored breathing 2L by . Diminished but clear to auscultation bilaterally.  Cardiovascular system: Regular rate and rhythm. No murmur, rub, or gallop. No JVD, and no pedal edema. Gastrointestinal system: Abdomen soft, much less lower abdominal tenderness without rebound. Non-distended, with normoactive bowel sounds. No organomegaly or masses felt. Central nervous system: Alert and oriented. No focal neurological deficits. Extremities: Warm, no deformities Skin: No rashes, lesions no ulcers Psychiatry: Judgement and insight appear normal. Mood & affect appropriate.   Data Reviewed: I have personally reviewed following labs and imaging studies  CBC:  Recent Labs Lab 10/10/16 0530 10/11/16 0709 10/12/16 0608 10/13/16 0638 10/14/16 0602  WBC 7.7 3.9* 5.5 4.5 4.0  HGB 12.8 12.8 13.0 12.7 11.8*  HCT 37.4  36.8 38.3 37.2 34.7*  MCV 78.4 77.8* 77.5* 77.8* 76.9*  PLT 222 190 207 198 423   Basic Metabolic Panel:  Recent Labs Lab 10/09/16 0455 10/10/16 0530 10/11/16 0709 10/13/16 0638 10/14/16 0602  NA 140 141 137 137 138  K 3.5 3.8 3.7 3.1* 3.8  CL 98* 96* 96* 99* 102  CO2 30 34* '29 29 29  '$ GLUCOSE 224* 131* 150* 143* 145*  BUN '19 14 10 14 8  '$ CREATININE 1.05* 1.09* 1.00 1.08* 1.00  CALCIUM 9.2 9.0 8.6* 8.4* 8.0*  MG  --   --   --   --  1.4*   GFR: Estimated Creatinine Clearance: 39 mL/min (by C-G formula based on SCr of 1 mg/dL). Liver Function Tests:  Recent Labs Lab 10/09/16 0455 10/10/16 0530 10/11/16 0709 10/13/16 0638 10/14/16 0602  AST '25 26 22 19 20  '$ ALT '17 16 14 '$ 13* 12*  ALKPHOS 32* 31* 30* 29* 26*  BILITOT 0.6 0.8 0.6 0.7 0.7  PROT 7.5 7.9 7.0 6.4* 6.0*  ALBUMIN 3.6 3.7 3.5 3.2* 2.9*    Recent Labs Lab 10/09/16 0455  LIPASE 23  No results for input(s): AMMONIA in the last 168 hours. Coagulation Profile:  Recent Labs Lab 10/10/16 0530 10/11/16 0709 10/12/16 0608 10/13/16 0638 10/14/16 0602  INR 2.19 2.18 1.80 1.64 1.30   Cardiac Enzymes: No results for input(s): CKTOTAL, CKMB, CKMBINDEX, TROPONINI in the last 168 hours. BNP (last 3 results) No results for input(s): PROBNP in the last 8760 hours. HbA1C: No results for input(s): HGBA1C in the last 72 hours. CBG:  Recent Labs Lab 10/13/16 0740 10/13/16 1139 10/13/16 1639 10/13/16 2150 10/14/16 0802  GLUCAP 132* 140* 100* 180* 136*   Lipid Profile: No results for input(s): CHOL, HDL, LDLCALC, TRIG, CHOLHDL, LDLDIRECT in the last 72 hours. Thyroid Function Tests: No results for input(s): TSH, T4TOTAL, FREET4, T3FREE, THYROIDAB in the last 72 hours. Anemia Panel:  Recent Labs  10/13/16 0638  VITAMINB12 1,159*  FOLATE 36.5  FERRITIN 22  TIBC 353  IRON 58  RETICCTPCT 1.4   Urine analysis:    Component Value Date/Time   COLORURINE YELLOW 10/09/2016 0501   APPEARANCEUR CLEAR  10/09/2016 0501   LABSPEC 1.021 10/09/2016 0501   PHURINE 5.0 10/09/2016 0501   GLUCOSEU 50 (A) 10/09/2016 0501   HGBUR NEGATIVE 10/09/2016 0501   BILIRUBINUR NEGATIVE 10/09/2016 0501   KETONESUR NEGATIVE 10/09/2016 0501   PROTEINUR 100 (A) 10/09/2016 0501   UROBILINOGEN 0.2 10/29/2011 1414   NITRITE NEGATIVE 10/09/2016 0501   LEUKOCYTESUR LARGE (A) 10/09/2016 0501   Recent Results (from the past 240 hour(s))  Urine culture     Status: Abnormal   Collection Time: 10/09/16  5:33 AM  Result Value Ref Range Status   Specimen Description URINE, RANDOM  Final   Special Requests NONE  Final   Culture (A)  Final    30,000 COLONIES/mL STREPTOCOCCUS AGALACTIAE TESTING AGAINST S. AGALACTIAE NOT ROUTINELY PERFORMED DUE TO PREDICTABILITY OF AMP/PEN/VAN SUSCEPTIBILITY.    Report Status 10/10/2016 FINAL  Final  Surgical pcr screen     Status: None   Collection Time: 10/13/16 11:00 PM  Result Value Ref Range Status   MRSA, PCR NEGATIVE NEGATIVE Final   Staphylococcus aureus NEGATIVE NEGATIVE Final    Comment:        The Xpert SA Assay (FDA approved for NASAL specimens in patients over 29 years of age), is one component of a comprehensive surveillance program.  Test performance has been validated by [facility] for patients greater than or equal to 68 year old. It is not intended to diagnose infection nor to guide or monitor treatment.       Radiology Studies: Mr 3d Recon At Scanner  Result Date: 10/13/2016 CLINICAL DATA:  Nausea and vomiting with abdominal pain. Suspected cholecystitis by CT. Nuclear medicine study revealed patent cystic duct with nonvisualization of gut activity. EXAM: MRI ABDOMEN WITHOUT AND WITH CONTRAST (INCLUDING MRCP) TECHNIQUE: Multiplanar multisequence MR imaging of the abdomen was performed both before and after the administration of intravenous contrast. Heavily T2-weighted images of the biliary and pancreatic ducts were obtained, and three-dimensional MRCP  images were rendered by post processing. CONTRAST:  74m MULTIHANCE GADOBENATE DIMEGLUMINE 529 MG/ML IV SOLN COMPARISON:  Nuclear medicine study 10/11/2016.  CT scan 10/09/2016. FINDINGS: Lower chest:  Heart is enlarged. Hepatobiliary: The liver unremarkable. Gallbladder is distended. Tiny layering stones are identified in the gallbladder lumen. No substantial intrahepatic biliary duct dilatation. Extrahepatic common duct measures 8 mm diameter which is upper normal for patient age. Common bile duct in the head of the pancreas is 6 mm, within normal limits  for age. There is no filling defect in the common duct or common bile duct to suggest choledocholithiasis. Pancreas: No focal mass lesion. No dilatation of the main duct. No intraparenchymal cyst. No peripancreatic edema. No evidence for pancreas divisum Spleen: No splenomegaly. No focal mass lesion. Adrenals/Urinary Tract: Left adrenal thickening noted without nodule. Right adrenal gland unremarkable. Cortical scarring noted both kidneys no hydronephrosis. No enhancing mass lesion. Stomach/Bowel: No evidence for dilated small bowel. Diverticular changes are noted in the left colon. Vascular/Lymphatic: Patient is status post aortic endograft placement for infrarenal aneurysm. There is no gastrohepatic or hepatoduodenal ligament lymphadenopathy. No intraperitoneal or retroperitoneal lymphadenopathy. Other: No intraperitoneal free fluid. Musculoskeletal: No abnormal marrow enhancement within the visualized bony anatomy. IMPRESSION: 1. Cholelithiasis without choledocholithiasis. The common duct distention has decreased since prior CT scan with both the common duct and common bile duct measuring normal diameter for patient age on today's study. No evidence for ampullary mass lesion or pancreatic head mass by MRI. There is no associated dilatation of the main pancreatic duct. 2. Status post endograft repair of abdominal aortic aneurysm. 3. Left colonic diverticulosis.  Electronically Signed   By: Misty Stanley M.D.   On: 10/13/2016 07:41   Mr Abdomen Mrcp Moise Boring Contast  Result Date: 10/13/2016 CLINICAL DATA:  Nausea and vomiting with abdominal pain. Suspected cholecystitis by CT. Nuclear medicine study revealed patent cystic duct with nonvisualization of gut activity. EXAM: MRI ABDOMEN WITHOUT AND WITH CONTRAST (INCLUDING MRCP) TECHNIQUE: Multiplanar multisequence MR imaging of the abdomen was performed both before and after the administration of intravenous contrast. Heavily T2-weighted images of the biliary and pancreatic ducts were obtained, and three-dimensional MRCP images were rendered by post processing. CONTRAST:  63m MULTIHANCE GADOBENATE DIMEGLUMINE 529 MG/ML IV SOLN COMPARISON:  Nuclear medicine study 10/11/2016.  CT scan 10/09/2016. FINDINGS: Lower chest:  Heart is enlarged. Hepatobiliary: The liver unremarkable. Gallbladder is distended. Tiny layering stones are identified in the gallbladder lumen. No substantial intrahepatic biliary duct dilatation. Extrahepatic common duct measures 8 mm diameter which is upper normal for patient age. Common bile duct in the head of the pancreas is 6 mm, within normal limits for age. There is no filling defect in the common duct or common bile duct to suggest choledocholithiasis. Pancreas: No focal mass lesion. No dilatation of the main duct. No intraparenchymal cyst. No peripancreatic edema. No evidence for pancreas divisum Spleen: No splenomegaly. No focal mass lesion. Adrenals/Urinary Tract: Left adrenal thickening noted without nodule. Right adrenal gland unremarkable. Cortical scarring noted both kidneys no hydronephrosis. No enhancing mass lesion. Stomach/Bowel: No evidence for dilated small bowel. Diverticular changes are noted in the left colon. Vascular/Lymphatic: Patient is status post aortic endograft placement for infrarenal aneurysm. There is no gastrohepatic or hepatoduodenal ligament lymphadenopathy. No  intraperitoneal or retroperitoneal lymphadenopathy. Other: No intraperitoneal free fluid. Musculoskeletal: No abnormal marrow enhancement within the visualized bony anatomy. IMPRESSION: 1. Cholelithiasis without choledocholithiasis. The common duct distention has decreased since prior CT scan with both the common duct and common bile duct measuring normal diameter for patient age on today's study. No evidence for ampullary mass lesion or pancreatic head mass by MRI. There is no associated dilatation of the main pancreatic duct. 2. Status post endograft repair of abdominal aortic aneurysm. 3. Left colonic diverticulosis. Electronically Signed   By: EMisty StanleyM.D.   On: 10/13/2016 07:41   Scheduled Meds: . insulin aspart  0-9 Units Subcutaneous TID WC  . mouth rinse  15 mL Mouth Rinse BID  .  metoprolol  5 mg Intravenous Q6H  . ondansetron (ZOFRAN) IV  4 mg Intravenous Once  . pantoprazole (PROTONIX) IV  40 mg Intravenous Q24H  . sodium chloride flush  3 mL Intravenous Q12H  . tiotropium  18 mcg Inhalation Daily   Continuous Infusions: . heparin Stopped (10/14/16 0435)     LOS: 4 days   Time spent: 23 minutes.  Irwin Brakeman, MD [facility] Pager [phone]  If 7PM-7AM, please contact night-coverage [email or web address] Password [facility] 10/14/2016, 10:17 AM

## 2016-10-14 NOTE — Care Management Important Message (Signed)
Important Message  Patient Details  Name: Alejandra [name] MRN: [id number] Date of Birth: 01-29-1932   Medicare Important Message Given:  Yes    Teryn Gust Montine Circle 10/14/2016, 11:53 AM

## 2016-10-14 NOTE — Op Note (Signed)
Operative Note  Alejandra [name] 83 y.o. female [id number]  10/14/2016  Surgeon: Clovis Riley   Assistant: OR staff  Procedure performed: Laparoscopic Cholecystectomy  Preop diagnosis: chronic cholecystitis Post-op diagnosis/intraop findings: same  Specimens: gallbladder  EBL: 15PP  Complications: none  Description of procedure: After obtaining informed consent the patient was brought to the operating room. Prophylactic antibiotics and subcutaneous heparin were administered. SCD's were applied. General endotracheal anesthesia was initiated and a formal time-out was performed. The abdomen was prepped and draped in the usual sterile fashion and the abdomen was entered using an infraumbilical veress needle after instilling the site with local. Insufflation to 558mHg was obtained, 558mtrocar and camera placed and gross inspection revealed no evidence of injury from our entry or other intraabdominal abnormalities. No significant adhesive disease although the pelvis was not closely inspected. Two 58m78mrocars were introduced in the right midclavicular and right anterior axillary lines under direct visualization and following infiltration with local. An 20m64mocar was placed in the epigastrium. The gallbladder was retracted cephalad and the infundibulum was retracted laterally. A fair amount of omental adhesions to the body and neck of the gallbladder were taken down bluntly and with cautery. A combination of hook electrocautery and blunt dissection was utilized to clear the peritoneum from the neck and cystic duct, circumferentially isolating the cystic artery and cystic duct and lifting the gallbladder from the cystic plate. The critical view of safety was achieved with the cystic artery, cystic duct, and liver bed visualized between them with no other structures. The artery was clipped with a single clip proximally and distally and divided as was the cystic duct with three clips on the proximal  end. The gallbladder was dissected from the liver plate using electrocautery. Once freed the gallbladder was placed in an endocatch bag and removed through the epigastric trocar site. Hemostasis was once again confirmed, and reinspection of the abdomen revealed no injuries. The clips were well opposed without any bile leak from the duct or the liver bed. I did see that a stone was impacted in the cystic duct just proximal to the clips. I placed one more clip proximal to this on the cystic duct. The 20mm54mcar site in the epigastrium was closed with a 0 vicryl in the fascia under direct visualization using a PMI device. The abdomen was desufflated and all trocars removed. The skin incisions were closed with running subcuticular monocryl and Dermabond. The patient was awakened, extubated and transported to the recovery room in stable condition.   All counts were correct at the completion of the case.

## 2016-10-14 NOTE — Transfer of Care (Signed)
Immediate Anesthesia Transfer of Care Note  Patient: Alejandra [name]  Procedure(s) Performed: Procedure(s): LAPAROSCOPIC CHOLECYSTECTOMY (N/A)  Patient Location: PACU  Anesthesia Type:General  Level of Consciousness: awake, alert , oriented and patient cooperative  Airway & Oxygen Therapy: Patient Spontanous Breathing and Patient connected to nasal cannula oxygen  Post-op Assessment: Report given to RN, Post -op Vital signs reviewed and stable and Patient moving all extremities X 4  Post vital signs: Reviewed and stable  Last Vitals:  Vitals:   10/14/16 0930 10/14/16 0939  BP: 126/72 130/74  Pulse: 67 65  Resp: 14 15  Temp: 36.8 C 36.7 C    Last Pain:  Vitals:   10/14/16 0939  TempSrc: Oral  PainSc:       Patients Stated Pain Goal: 0 (50/35/46 5681)  Complications: No apparent anesthesia complications

## 2016-10-14 NOTE — Anesthesia Postprocedure Evaluation (Addendum)
Anesthesia Post Note  Patient: Alejandra [name]  Procedure(s) Performed: Procedure(s) (LRB): LAPAROSCOPIC CHOLECYSTECTOMY (N/A)  Patient location during evaluation: PACU Anesthesia Type: General Level of consciousness: awake and alert Pain management: pain level controlled Vital Signs Assessment: post-procedure vital signs reviewed and stable Respiratory status: spontaneous breathing, nonlabored ventilation, respiratory function stable and patient connected to nasal cannula oxygen Cardiovascular status: blood pressure returned to baseline and stable Postop Assessment: no signs of nausea or vomiting Anesthetic complications: no       Last Vitals:  Vitals:   10/14/16 1334 10/14/16 1349  BP: (!) 172/100 (!) 178/85  Pulse: 73 72  Resp: 18 18  Temp:      Last Pain:  Vitals:   10/14/16 0939  TempSrc: Oral  PainSc:                  Laelle Bridgett S

## 2016-10-14 NOTE — Progress Notes (Signed)
ANTICOAGULATION CONSULT NOTE - Follow Up Consult  Pharmacy Consult for Heparin  Indication: Hx PE/hypercoagulable state  Allergies  Allergen Reactions  . Penicillins Nausea And Vomiting, Rash and Other (See Comments)    Childhood reaction   Patient Measurements: Height: '5\' 5"'$  (165.1 cm) Weight: 150 lb (68 kg) IBW/kg (Calculated) : 57  Vital Signs: Temp: 98 F (36.7 C) (03/29 0939) Temp Source: Oral (03/29 0939) BP: 130/74 (03/29 0939) Pulse Rate: 65 (03/29 0939)  Labs:  Recent Labs  10/12/16 0608 10/12/16 2223 10/13/16 6301 10/14/16 0602  HGB 13.0  --  12.7 11.8*  HCT 38.3  --  37.2 34.7*  PLT 207  --  198 191  LABPROT 21.1*  --  19.6* 16.3*  INR 1.80  --  1.64 1.30  HEPARINUNFRC  --  0.33 0.53  --   CREATININE  --   --  1.08* 1.00    Estimated Creatinine Clearance: 39 mL/min (by C-G formula based on SCr of 1 mg/dL).   Assessment: 37 YOF on warfarin PTA for history of hypercoagulable state and PE. Warfarin on hold in anticipation of GI surgery. With new AFib- started on IV heparin as bridge. INR down to 1.3.  Hgb 11.8, platelets within normal limits. No bleeding noted.  Plans for OR today at 1100.  Heparin was turned off ~0400 this morning per report from RN.  Goal of Therapy:  Heparin level 0.3-0.7 units/ml Monitor platelets by anticoagulation protocol: Yes   Plan:  -Follow up resumption of heparin and warfarin post-op  Shekelia Boutin D. Jadene Stemmer, PharmD, BCPS Clinical Pharmacist Pager: [phone] 10/14/2016 10:12 AM

## 2016-10-14 NOTE — Anesthesia Procedure Notes (Signed)
Procedure Name: Intubation Date/Time: 10/14/2016 12:19 PM Performed by: Julieta Bellini Pre-anesthesia Checklist: Patient identified, Emergency Drugs available, Suction available and Patient being monitored Patient Re-evaluated:Patient Re-evaluated prior to inductionOxygen Delivery Method: Circle system utilized Preoxygenation: Pre-oxygenation with 100% oxygen Intubation Type: IV induction Ventilation: Mask ventilation without difficulty Laryngoscope Size: Mac and 3 Grade View: Grade III Tube type: Oral Tube size: 7.0 mm Number of attempts: 1 Airway Equipment and Method: Stylet Placement Confirmation: ETT inserted through vocal cords under direct vision,  positive ETCO2 and breath sounds checked- equal and bilateral Secured at: 22 cm Tube secured with: Tape Dental Injury: Teeth and Oropharynx as per pre-operative assessment

## 2016-10-14 NOTE — Anesthesia Preprocedure Evaluation (Addendum)
Anesthesia Evaluation  Patient identified by MRN, date of birth, ID band Patient awake    Reviewed: Allergy & Precautions, NPO status , Patient's Chart, lab work & pertinent test results  Airway Mallampati: II  TM Distance: >3 FB Neck ROM: Full    Dental no notable dental hx. (+) Missing, Dental Advisory Given   Pulmonary COPD,  oxygen dependent, former smoker,    Pulmonary exam normal breath sounds clear to auscultation       Cardiovascular hypertension, + Peripheral Vascular Disease  Normal cardiovascular exam Rhythm:Regular Rate:Normal  Left ventricle: The cavity size was normal. Wall thickness was   increased in a pattern of mild LVH. Systolic function was normal.   The estimated ejection fraction was in the range of 55% to 60%.   Wall motion was normal; there were no regional wall motion   abnormalities. Doppler parameters are consistent with abnormal   left ventricular relaxation (grade 1 diastolic dysfunction). The   E/e&' ratio is between 8-15, suggesting indeterminate LV filling   pressure. - Left atrium: The atrium was normal in size. - Right ventricle: The cavity size was mildly dilated. Systolic   function was normal. - Right atrium: The atrium is severely dilated. - Tricuspid valve: There was moderate regurgitation. - Pulmonary arteries: PA peak pressure: 52 mm Hg (S). - Inferior vena cava: The vessel was normal in size. The   respirophasic diameter changes were in the normal range (>= 50%),   consistent with normal central venous pressure.  Impressions:  - LVEF 55-60%, mild LVH, normal wall motion, diastolic dysfunction,   indeterminate LV filling pressure, normal LA size, mildly dilated   RV with normal systolic function, severe RAE, moderate TR, RVSP   52 mmHg, normal IVC.   Neuro/Psych negative neurological ROS  negative psych ROS   GI/Hepatic negative GI ROS, Neg liver ROS,   Endo/Other  diabetes  Renal/GU Renal InsufficiencyRenal disease  negative genitourinary   Musculoskeletal negative musculoskeletal ROS (+)   Abdominal   Peds negative pediatric ROS (+)  Hematology negative hematology ROS (+)   Anesthesia Other Findings   Reproductive/Obstetrics negative OB ROS                            Anesthesia Physical Anesthesia Plan  ASA: IV  Anesthesia Plan: General   Post-op Pain Management:    Induction: Intravenous  Airway Management Planned: Oral ETT  Additional Equipment:   Intra-op Plan:   Post-operative Plan: Possible Post-op intubation/ventilation  Informed Consent: I have reviewed the patients History and Physical, chart, labs and discussed the procedure including the risks, benefits and alternatives for the proposed anesthesia with the patient or authorized representative who has indicated his/her understanding and acceptance.   Dental advisory given  Plan Discussed with: CRNA, Surgeon and Anesthesiologist  Anesthesia Plan Comments:        Anesthesia Quick Evaluation

## 2016-10-14 NOTE — Progress Notes (Signed)
ANTICOAGULATION CONSULT NOTE - Follow Up Consult  Pharmacy Consult for Heparin  Indication: Hx PE/hypercoagulable state  Allergies  Allergen Reactions  . Penicillins Nausea And Vomiting, Rash and Other (See Comments)    Childhood reaction   Patient Measurements: Height: '5\' 5"'$  (165.1 cm) Weight: 150 lb (68 kg) IBW/kg (Calculated) : 57  Vital Signs: Temp: 98.1 F (36.7 C) (03/29 1442) Temp Source: Oral (03/29 1442) BP: 172/80 (03/29 1442) Pulse Rate: 72 (03/29 1442)  Labs:  Recent Labs  10/12/16 3383 10/12/16 2223 10/13/16 2919 10/14/16 0602  HGB 13.0  --  12.7 11.8*  HCT 38.3  --  37.2 34.7*  PLT 207  --  198 191  LABPROT 21.1*  --  19.6* 16.3*  INR 1.80  --  1.64 1.30  HEPARINUNFRC  --  0.33 0.53  --   CREATININE  --   --  1.08* 1.00    Estimated Creatinine Clearance: 39 mL/min (by C-G formula based on SCr of 1 mg/dL).   Assessment: Alejandra [name] on warfarin PTA for history of hypercoagulable state and PE. Warfarin on hold in anticipation of GI surgery. With new AFib- started on IV heparin as bridge. INR down to 1.3. Hgb 11.8, platelets within normal limits. No bleeding noted.  Spoke with Dr. Kae Heller from general surgery- to restart heparin at 1900 tonight. She also prefers restarting warfarin tomorrow (3/30)  Goal of Therapy:  Heparin level 0.3-0.7 units/ml Monitor platelets by anticoagulation protocol: Yes   Plan:  -restart heparin at 850 units/hr at 1900 tonight -daily heparin level, INR, and CBC -follow for s/s bleeding  Jacobus Colvin D. Lawsyn Heiler, PharmD, BCPS Clinical Pharmacist Pager: [phone] 10/14/2016 3:15 PM

## 2016-10-14 NOTE — Progress Notes (Signed)
Pt alert and orientated x 4 back from OR.   States she's in pain 10/10. See MAR.  Will continue to monitor   Denies nausea.

## 2016-10-15 ENCOUNTER — Encounter (HOSPITAL_COMMUNITY): Payer: Self-pay | Admitting: Surgery

## 2016-10-15 DIAGNOSIS — K819 Cholecystitis, unspecified: Secondary | ICD-10-CM

## 2016-10-15 LAB — CBC
HCT: 35.6 % — ABNORMAL LOW (ref 36.0–46.0)
Hemoglobin: 12.1 g/dL (ref 12.0–15.0)
MCH: 26.1 pg (ref 26.0–34.0)
MCHC: 34 g/dL (ref 30.0–36.0)
MCV: 76.9 fL — AB (ref 78.0–100.0)
Platelets: 165 10*3/uL (ref 150–400)
RBC: 4.63 MIL/uL (ref 3.87–5.11)
RDW: 16.6 % — AB (ref 11.5–15.5)
WBC: 6.3 10*3/uL (ref 4.0–10.5)

## 2016-10-15 LAB — PROTIME-INR
INR: 1.28
PROTHROMBIN TIME: 16 s — AB (ref 11.4–15.2)

## 2016-10-15 LAB — GLUCOSE, CAPILLARY
GLUCOSE-CAPILLARY: 127 mg/dL — AB (ref 65–99)
Glucose-Capillary: 126 mg/dL — ABNORMAL HIGH (ref 65–99)
Glucose-Capillary: 169 mg/dL — ABNORMAL HIGH (ref 65–99)
Glucose-Capillary: 114 mg/dL — ABNORMAL HIGH (ref 65–99)

## 2016-10-15 LAB — HEPARIN LEVEL (UNFRACTIONATED): HEPARIN UNFRACTIONATED: 0.45 [IU]/mL (ref 0.30–0.70)

## 2016-10-15 MED ORDER — METOPROLOL TARTRATE 50 MG PO TABS
50.0000 mg | ORAL_TABLET | Freq: Two times a day (BID) | ORAL | Status: DC
  Administered 2016-10-15 – 2016-10-16 (×3): 50 mg via ORAL
  Filled 2016-10-15 (×3): qty 1

## 2016-10-15 MED ORDER — ASPIRIN 81 MG PO CHEW
81.0000 mg | CHEWABLE_TABLET | Freq: Every day | ORAL | Status: DC
  Administered 2016-10-15 – 2016-10-16 (×2): 81 mg via ORAL
  Filled 2016-10-15 (×2): qty 1

## 2016-10-15 MED ORDER — TRIAMTERENE-HCTZ 37.5-25 MG PO TABS
1.0000 | ORAL_TABLET | Freq: Every day | ORAL | Status: DC
  Administered 2016-10-15 – 2016-10-16 (×2): 1 via ORAL
  Filled 2016-10-15 (×2): qty 1

## 2016-10-15 MED ORDER — WARFARIN - PHARMACIST DOSING INPATIENT
Freq: Every day | Status: DC
Start: 2016-10-15 — End: 2016-10-16

## 2016-10-15 MED ORDER — ENOXAPARIN SODIUM 100 MG/ML ~~LOC~~ SOLN
100.0000 mg | SUBCUTANEOUS | Status: DC
  Administered 2016-10-15 – 2016-10-16 (×2): 100 mg via SUBCUTANEOUS
  Filled 2016-10-15 (×2): qty 1

## 2016-10-15 MED ORDER — WARFARIN SODIUM 7.5 MG PO TABS
7.5000 mg | ORAL_TABLET | Freq: Once | ORAL | Status: AC
  Administered 2016-10-15: 7.5 mg via ORAL
  Filled 2016-10-15: qty 1

## 2016-10-15 NOTE — Progress Notes (Signed)
Heparin stopped 1120

## 2016-10-15 NOTE — Progress Notes (Signed)
[facility] Progress Note  1 Day Post-Op  Subjective: No acute events. Feels the same as she did before surgery.   Objective: Vital signs in last 24 hours: Temp:  [97.2 F (36.2 C)-98.6 F (37 C)] 98.6 F (37 C) (03/30 0941) Pulse Rate:  [67-93] 88 (03/30 0941) Resp:  [14-22] 17 (03/30 0941) BP: (149-184)/(80-102) 149/88 (03/30 0941) SpO2:  [92 %-99 %] 96 % (03/30 0941) Weight:  [68.5 kg (151 lb)] 68.5 kg (151 lb) (03/29 1940) Last BM Date: 10/13/16  Intake/Output from previous day: 03/29 0701 - 03/30 0700 In: 1066 [P.O.:400; I.V.:666] Out: 1210 [Urine:1200; Blood:10] Intake/Output this shift: No intake/output data recorded.  PE: Gen:  Alert, NAD, pleasant and cooperating HEENT: Cool in place Card:  Regular rate Pulm:  Non-labored, clear to auscultation bilaterally  Abd: Soft, non-distended, incisions c/d/I with dermabond. A cardiac lead had been placed directly on her epigastric incision- I removed it and some dermabond came off.  Lab Results:   Recent Labs  10/14/16 0602 10/15/16 0542  WBC 4.0 6.3  HGB 11.8* 12.1  HCT 34.7* 35.6*  PLT 191 165   BMET  Recent Labs  10/13/16 0638 10/14/16 0602  NA 137 138  K 3.1* 3.8  CL 99* 102  CO2 29 29  GLUCOSE 143* 145*  BUN 14 8  CREATININE 1.08* 1.00  CALCIUM 8.4* 8.0*   PT/INR  Recent Labs  10/14/16 0602 10/15/16 0542  LABPROT 16.3* 16.0*  INR 1.30 1.28   CMP     Component Value Date/Time   NA 138 10/14/2016 0602   K 3.8 10/14/2016 0602   CL 102 10/14/2016 0602   CO2 29 10/14/2016 0602   GLUCOSE 145 (H) 10/14/2016 0602   BUN 8 10/14/2016 0602   CREATININE 1.00 10/14/2016 0602   CREATININE 1.10 06/21/2014 0953   CALCIUM 8.0 (L) 10/14/2016 0602   PROT 6.0 (L) 10/14/2016 0602   ALBUMIN 2.9 (L) 10/14/2016 0602   AST 20 10/14/2016 0602   ALT 12 (L) 10/14/2016 0602   ALKPHOS 26 (L) 10/14/2016 0602   BILITOT 0.7 10/14/2016 0602   GFRNONAA 51 (L) 10/14/2016 0602   GFRAA 59 (L) 10/14/2016  0602   Lipase     Component Value Date/Time   LIPASE 23 10/09/2016 0455       Studies/Results: No results found.  Anti-infectives: Anti-infectives    Start     Dose/Rate Route Frequency Ordered Stop   10/14/16 1215  vancomycin (VANCOCIN) IVPB 1000 mg/200 mL premix     1,000 mg 200 mL/hr over 60 Minutes Intravenous To Surgery 10/14/16 1207 10/14/16 1314     Assessment/Plan Cholelithiasis - abdominal pain associated with nausea and vomiting. Pain was initially in the upper abdomen but has localized to LLQ. - LFT's WNL, no leukocytosis  - HIDA negative for cholecystitis, though no emptying into bowel seen.  - MRCP negative for choledocholithiasis or ampullary lesion -S/p uncomplicated lap chole  Atrial fibrillation w/ RVR- new; appreciate cardiology seeing and clearing pt for OR. Metoprolol 5 mg q 6h Hx PE on Coumadin - held, heparin gtt per pharmacy; INR 1.8 COPD Dyslipidemia Diabetes Mellitus  CKD III HTN Chronic respiratory failure   Plan: Advance diet. Bowel regimen, may benefit from some suppositories/enemas. OK to resume coumadin. Continued care per primary team.     LOS: 5 days    Clovis Riley , MD

## 2016-10-15 NOTE — Progress Notes (Addendum)
ADDENDUM Patient to transition off heparin drip and on to subQ Lovenox to ease discharge planning. With new onset AFib and history of PE/hypercoagulable state, would recommend INR being therapeutic prior to parenteral therapy being stopped.  Plan: -heparin infusion to stop at 1100, then start Lovenox '100mg'$  (1.'5mg'$ /kg) subQ q24h at noon -warfarin 7.'5mg'$  po x1 tonight  -daily INR and CBC    Benino Korinek D. Nalda Shackleford, PharmD, BCPS Clinical Pharmacist Pager: [phone] 10/15/2016 10:18 AM   [facility] for Heparin + warfarin Indication: Hx PE/hypercoagulable state, new AFib  Allergies  Allergen Reactions  . Penicillins Nausea And Vomiting, Rash and Other (See Comments)    Childhood reaction   Patient Measurements: Height: '5\' 5"'$  (165.1 cm) Weight: 151 lb (68.5 kg) IBW/kg (Calculated) : 57  Vital Signs: Temp: 98.3 F (36.8 C) (03/30 0350) BP: 159/85 (03/30 0350) Pulse Rate: 85 (03/30 0350)  Labs:  Recent Labs  10/12/16 2223  10/13/16 1517 10/14/16 0602 10/15/16 0542  HGB  --   < > 12.7 11.8* 12.1  HCT  --   --  37.2 34.7* 35.6*  PLT  --   --  198 191 165  LABPROT  --   --  19.6* 16.3* 16.0*  INR  --   --  1.64 1.30 1.28  HEPARINUNFRC 0.33  --  0.53  --  0.45  CREATININE  --   --  1.08* 1.00  --   < > = values in this interval not displayed.  Estimated Creatinine Clearance: 42.2 mL/min (by C-G formula based on SCr of 1 mg/dL).   Assessment: 64 YOF on warfarin PTA for history of hypercoagulable state and PE. Warfarin on hold in anticipation of GI surgery. With new AFib- started on IV heparin as bridge which was resumed last evening at 1900 per Dr. Ron Parker request. Dr. Kae Heller also requested warfarin started tonight (3/30).  Hgb 12.1, platelets 165- stable, no bleeding noted. INR 1.28.  Goal of Therapy:  Heparin level 0.3-0.7 units/ml Monitor platelets by anticoagulation protocol: Yes   Plan:  -Continue heparin at 850 units/hr -Warfarin  7.'5mg'$  po x1 tonight  -Daily heparin level, INR, and CBC -Follow for s/s bleeding  Shaquoia Miers D. Casilda Pickerill, PharmD, BCPS Clinical Pharmacist Pager: [phone] 10/15/2016 8:48 AM

## 2016-10-15 NOTE — Progress Notes (Signed)
Progress Note  Patient Name: Alejandra [name] Date of Encounter: 10/15/2016  Primary Cardiologist: Ellyn Hack (new)  Subjective   Denies chest pain or shortness of breath.  Inpatient Medications    Scheduled Meds: . docusate sodium  100 mg Oral BID  . insulin aspart  0-9 Units Subcutaneous TID WC  . mouth rinse  15 mL Mouth Rinse BID  . metoprolol  5 mg Intravenous Q6H  . ondansetron (ZOFRAN) IV  4 mg Intravenous Once  . pantoprazole (PROTONIX) IV  40 mg Intravenous Q24H  . senna  1 tablet Oral Daily  . sodium chloride flush  3 mL Intravenous Q12H  . tiotropium  18 mcg Inhalation Daily   Continuous Infusions: . heparin 850 Units/hr (10/14/16 1953)   PRN Meds: sodium chloride, acetaminophen, albuterol, bisacodyl, hydrocortisone cream, iopamidol, metoprolol, morphine injection, ondansetron **OR** ondansetron (ZOFRAN) IV, sodium chloride flush, traMADol   Vital Signs    Vitals:   10/14/16 1940 10/14/16 2038 10/14/16 2334 10/15/16 0350  BP:  (!) 160/89 (!) 160/85 (!) 159/85  Pulse:  75 77 85  Resp:  14  16  Temp:  98.2 F (36.8 C)  98.3 F (36.8 C)  TempSrc:      SpO2:  97%  92%  Weight: 151 lb (68.5 kg)     Height:        Intake/Output Summary (Last 24 hours) at 10/15/16 0741 Last data filed at 10/15/16 1610  Gross per 24 hour  Intake          1065.99 ml  Output             1210 ml  Net          -144.01 ml   Filed Weights   10/12/16 2148 10/13/16 2100 10/14/16 1940  Weight: 151 lb (68.5 kg) 150 lb (68 kg) 151 lb (68.5 kg)    Telemetry    NSR - few brief runs of SVT, but no atrial fib identified last 12 hours - Personally Reviewed   Physical Exam  Elderly woman, comfortable, NAD GEN: No acute distress.   Neck: No JVD Cardiac: RRR, no murmurs, rubs, or gallops.  Respiratory: Clear to auscultation bilaterally. GI: Soft, nontender, non-distended  MS: No edema; No deformity. Neuro:  Nonfocal  Psych: Normal affect   Labs    Chemistry Recent  Labs Lab 10/11/16 0709 10/13/16 0638 10/14/16 0602  NA 137 137 138  K 3.7 3.1* 3.8  CL 96* 99* 102  CO2 '29 29 29  '$ GLUCOSE 150* 143* 145*  BUN '10 14 8  '$ CREATININE 1.00 1.08* 1.00  CALCIUM 8.6* 8.4* 8.0*  PROT 7.0 6.4* 6.0*  ALBUMIN 3.5 3.2* 2.9*  AST '22 19 20  '$ ALT 14 13* 12*  ALKPHOS 30* 29* 26*  BILITOT 0.6 0.7 0.7  GFRNONAA 51* 46* 51*  GFRAA 59* 54* 59*  ANIONGAP '12 9 7     '$ Hematology Recent Labs Lab 10/13/16 9604 10/14/16 0602 10/15/16 0542  WBC 4.5 4.0 6.3  RBC 4.78  4.78 4.51 4.63  HGB 12.7 11.8* 12.1  HCT 37.2 34.7* 35.6*  MCV 77.8* 76.9* 76.9*  MCH 26.6 26.2 26.1  MCHC 34.1 34.0 34.0  RDW 17.2* 16.5* 16.6*  PLT 198 191 165    Cardiac EnzymesNo results for input(s): TROPONINI in the last 168 hours. No results for input(s): TROPIPOC in the last 168 hours.   BNP Recent Labs Lab 10/09/16 1310  BNP 79.4     DDimer No results for  input(s): DDIMER in the last 168 hours.   Radiology    No results found.   Patient Profile     83 y.o. female admitted with symptomatic cholelithiasis, now s/p lap chole 3/29, who has had paroxysmal AF/pSVT. She is on long-term warfarin since pulmonary embolus in 2011.   Assessment & Plan    1. Paroxysmal atrial fibrillation: maintaining sinus rhythm. On metoprolol 5 mg IV Q6 hours. Rhythm well-controlled. When taking PO would switch to metoprolol 50 mg BID. Start warfarin when ok with surgical team.  2. HTN, uncontrolled: suspect partly related to post-op state. Start back on maxzide.   Other problems stable per [facility] and surgical team. Suspect she is ok to restart warfarin but would confirm with surgical team. Will write for maxzide to be restarted. Change metoprolol to PO.  Deatra James, MD  10/15/2016, 7:41 AM

## 2016-10-15 NOTE — Discharge Instructions (Addendum)
Go to the coumadin (warfarin) clinic in 3 days to have PT/INR tested.  See your PCP in 3-5 days for a hospital follow up appointment.   Resume your regular home warfarin doses unless your primary care physician changes the doses.  You should be taking 5 mg daily except Mondays when you take 7.5 mg.   Continue the lovenox injections until your primary care physician tells you that you can stop taking the injections.    Return if symptoms recur, worsen or new problems develop.    Information on my medicine - Coumadin   (Warfarin)  This medication education was reviewed with me or my healthcare representative as part of my discharge preparation.   Why was Coumadin prescribed for you? Coumadin was prescribed for you because you have a blood clot or a medical condition that can cause an increased risk of forming blood clots. Blood clots can cause serious health problems by blocking the flow of blood to the heart, lung, or brain. Coumadin can prevent harmful blood clots from forming. As a reminder your indication for Coumadin is:   Blood Clotting Disorder  What test will check on my response to Coumadin? While on Coumadin (warfarin) you will need to have an INR test regularly to ensure that your dose is keeping you in the desired range. The INR (international normalized ratio) number is calculated from the result of the laboratory test called prothrombin time (PT).  If an INR APPOINTMENT HAS NOT ALREADY BEEN MADE FOR YOU please schedule an appointment to have this lab work done by your health care provider within 7 days. Your INR goal is usually a number between:  2 to 3 or your provider may give you a more narrow range like 2-2.5.  Ask your health care provider during an office visit what your goal INR is.  What  do you need to  know  About  COUMADIN? Take Coumadin (warfarin) exactly as prescribed by your healthcare provider about the same time each day.  DO NOT stop taking without talking to the  doctor who prescribed the medication.  Stopping without other blood clot prevention medication to take the place of Coumadin may increase your risk of developing a new clot or stroke.  Get refills before you run out.  What do you do if you miss a dose? If you miss a dose, take it as soon as you remember on the same day then continue your regularly scheduled regimen the next day.  Do not take two doses of Coumadin at the same time.  Important Safety Information A possible side effect of Coumadin (Warfarin) is an increased risk of bleeding. You should call your healthcare provider right away if you experience any of the following: ? Bleeding from an injury or your nose that does not stop. ? Unusual colored urine (red or dark brown) or unusual colored stools (red or black). ? Unusual bruising for unknown reasons. ? A serious fall or if you hit your head (even if there is no bleeding).  Some foods or medicines interact with Coumadin (warfarin) and might alter your response to warfarin. To help avoid this: ? Eat a balanced diet, maintaining a consistent amount of Vitamin K. ? Notify your provider about major diet changes you plan to make. ? Avoid alcohol or limit your intake to 1 drink for women and 2 drinks for men per day. (1 drink is 5 oz. wine, 12 oz. beer, or 1.5 oz. liquor.)  Make sure that ANY  health care provider who prescribes medication for you knows that you are taking Coumadin (warfarin).  Also make sure the healthcare provider who is monitoring your Coumadin knows when you have started a new medication including herbals and non-prescription products.  Coumadin (Warfarin)  Major Drug Interactions  Increased Warfarin Effect Decreased Warfarin Effect  Alcohol (large quantities) Antibiotics (esp. Septra/Bactrim, Flagyl, Cipro) Amiodarone (Cordarone) Aspirin (ASA) Cimetidine (Tagamet) Megestrol (Megace) NSAIDs (ibuprofen, naproxen, etc.) Piroxicam (Feldene) Propafenone (Rythmol  SR) Propranolol (Inderal) Isoniazid (INH) Posaconazole (Noxafil) Barbiturates (Phenobarbital) Carbamazepine (Tegretol) Chlordiazepoxide (Librium) Cholestyramine (Questran) Griseofulvin Oral Contraceptives Rifampin Sucralfate (Carafate) Vitamin K   Coumadin (Warfarin) Major Herbal Interactions  Increased Warfarin Effect Decreased Warfarin Effect  Garlic Ginseng Ginkgo biloba Coenzyme Q10 Green tea St. Johns wort    Coumadin (Warfarin) FOOD Interactions  Eat a consistent number of servings per week of foods HIGH in Vitamin K (1 serving =  cup)  Collards (cooked, or boiled & drained) Kale (cooked, or boiled & drained) Mustard greens (cooked, or boiled & drained) Parsley *serving size only =  cup Spinach (cooked, or boiled & drained) Swiss chard (cooked, or boiled & drained) Turnip greens (cooked, or boiled & drained)  Eat a consistent number of servings per week of foods MEDIUM-HIGH in Vitamin K (1 serving = 1 cup)  Asparagus (cooked, or boiled & drained) Broccoli (cooked, boiled & drained, or raw & chopped) Brussel sprouts (cooked, or boiled & drained) *serving size only =  cup Lettuce, raw (green leaf, endive, romaine) Spinach, raw Turnip greens, raw & chopped   These websites have more information on Coumadin (warfarin):  FailFactory.se; VeganReport.com.au;   [facility] ______[facility] LAPAROSCOPIC SURGERY: POST OP INSTRUCTIONS Always review your discharge instruction sheet given to you by the facility where your surgery was performed. IF YOU HAVE DISABILITY OR FAMILY LEAVE FORMS, YOU MUST BRING THEM TO THE OFFICE FOR PROCESSING.   DO NOT GIVE THEM TO YOUR DOCTOR.  1. A prescription for pain medication may be given to you upon discharge.  Take your pain medication as prescribed, if needed.  If narcotic pain medicine is not needed, then you may take acetaminophen (Tylenol) or ibuprofen (Advil) as needed. 2. Take your usually  prescribed medications unless otherwise directed. 3. If you need a refill on your pain medication, please contact your pharmacy.  They will contact our office to request authorization. Prescriptions will not be filled after 5pm or on week-ends. 4. You should follow a light diet the first few days after arrival home, such as soup and crackers, etc.  Be sure to include lots of fluids daily. 5. Most patients will experience some swelling and bruising in the area of the incisions.  Ice packs will help.  Swelling and bruising can take several days to resolve.  6. It is common to experience some constipation if taking pain medication after surgery.  Increasing fluid intake and taking a stool softener (such as Colace) will usually help or prevent this problem from occurring.  A mild laxative (Milk of Magnesia or Miralax) should be taken according to package instructions if there are no bowel movements after 48 hours. 7. Unless discharge instructions indicate otherwise, you may remove your bandages 24-48 hours after surgery, and you may shower at that time.  You may have steri-strips (small skin tapes) in place directly over the incision.  These strips should be left on the skin for 7-10 days.  If your surgeon used skin glue on the incision, you may shower in  24 hours.  The glue will flake off over the next 2-3 weeks.  Any sutures or staples will be removed at the office during your follow-up visit. 8. ACTIVITIES:  You may resume regular (light) daily activities beginning the next day--such as daily self-care, walking, climbing stairs--gradually increasing activities as tolerated.  You may have sexual intercourse when it is comfortable.  Refrain from any heavy lifting or straining until approved by your doctor. a. You may drive when you are no longer taking prescription pain medication, you can comfortably wear a seatbelt, and you can safely maneuver your car and apply brakes. b. RETURN TO WORK:   __________________________________________________________ 9. You should see your doctor in the office for a follow-up appointment approximately 2-3 weeks after your surgery.  Make sure that you call for this appointment within a day or two after you arrive home to insure a convenient appointment time. 10. OTHER INSTRUCTIONS: __________________________________________________________________________________________________________________________ __________________________________________________________________________________________________________________________ WHEN TO CALL YOUR DOCTOR: 1. Fever over 101.0 2. Inability to urinate 3. Continued bleeding from incision. 4. Increased pain, redness, or drainage from the incision. 5. Increasing abdominal pain  The clinic staff is available to answer your questions during regular business hours.  Please dont hesitate to call and ask to speak to one of the nurses for clinical concerns.  If you have a medical emergency, go to the nearest emergency room or call 911.  A surgeon from [facility] is always on call at the hospital. [address] [place], [place], [place]  [address] ? [address] [place], [place], [place]   [address] [phone] ? [phone] ? FAX [phone] Web site: [email or web address]

## 2016-10-15 NOTE — Progress Notes (Signed)
PROGRESS NOTE  Alejandra [name]  DDU:[id number] DOB: May 19, 1932 DOA: 10/09/2016 PCP: Mauricio Po, FNP - [place]  Brief Narrative: Alejandra [name] is an 83 y.o. female with a history of AAA s/p endovascular repair 2004, PE 2011 on coumadin, COPD on 2L O2, DM, HTN, and HLD who presented 3/24 with epigastric abdominal pain, nausea and vomiting. Symptoms were significantly improved with pain medications in ED. In the ED she was afebrile, BP 171/89, HR 77. WBC 7.7. Abd U/S showed mild gallbladder distention with echogenic stones without wall thickening or pericholecystic fluid. CBG 42m without ductal system mass/calculus. Subsequent CT abdomen showed similar findings with trace pericholecystic fluid concerning for acute cholecystitis. AAA was also seen s/p endograft repair without significant interval dilatation. CXR showed bibasilar atelectasis without infiltrate or edema. General surgery was consulted, recommending monitoring while holding coumadin (INR 2.2 on arrival).  Assessment & Plan: Principal Problem:   Acute cholecystitis Active Problems:   COPD (chronic obstructive pulmonary disease) ([name])   Dyslipidemia   CKD stage 3 secondary to diabetes ([name])   Anticoagulated on Coumadin   HTN (hypertension), malignant   Chronic respiratory failure with hypoxia ([name])   Atrial fibrillation with rapid ventricular response ([name])  Symptomatic cholelithiasis: CT with gallbladder distention, radiopaque stones/ debris, new intrahepatic bile duct dilation, and trace pericholecystic fluid. CBD up to 182mfrom 50m72mNo leukocytosis, LFTs wnl so doubt CBD obstruction. HIDA scan abnormal with nonvisualization of gallbladder (physiologic due to NPO vs. analgesics vs. CBD obstruction).  Pt going to OR 3/29 for lap chole.  -POD1 s/p lap chole on 3/29, doing better, ok per surgery to resume warfarin.    HTN: Elevated on admission, now normotensive. - Resume metoprolol '50mg'$  BID and maxide 37.5 - '25mg'$  - Would  consider hydralazine IV as needed if becomes uncontrolled.  Pt with telemetric evidence of AFib. Not a known diagnosis PTA. HR down to 90's with administration of schedule metoprolol IV. STAT ECG shows unchanged RBBB with sinus rhythm. Asymptomatic. Recent TSH wnl. K wnl yesterday.  - Continue metoprolol as above - lovenox bridge while coumadin subtherapeutic - Echocardiogram completed: LVEF 55-60%, mild LVH, normal wall motion, diastolic dysfunction,  indeterminate LV filling pressure, normal LA size, mildly dilated RV with normal systolic function, severe RAE, moderate TR, RVSP 52 mmHg, normal IVC - Cardiology consulted for new AFib and pre-operative clearance - see notes.   T2DM: Well-controlled, HbA1c 6.9%.  - Hold metformin - SSI  CBG (last 3)   Recent Labs  10/14/16 2138 10/15/16 0800 10/15/16 1151  GLUCAP 160* 127* 126*   Chronic anticoagulation with coumadin for hx of prior AAA repair 2004 and PE (dx 01/2010). INR 2.24 on admission, will allow to trend down by holding coumadin. No indication for reversal at this time.  - lovenox bridging  Microcytosis: on labs 3/26 without anemia.   Chronic hypoxemic respiratory failure due to COPD: No exacerbation is evident, stable on home 2L by [place]. CXR with atelectasis only.  - Continue home spiriva, nebulizers - Continue 2L O2 by [place].   GERD: Chronic, stable.  - IV PPI due to NPO  AAA: No significant growth since last surveillance 12/30/2014, though CT showed diffuse aortic disease with increasing diameter of the suprarenal aorta, now measuring 3.5 cm.  - Continued surveillance of suprarenal aorta is also recommended. - Continue statin - resume home ASA   DVT prophylaxis: lovenox, warfarin Code Status: Full Family Communication: None at bedside this AM Disposition Plan: home in 1-2 days  Consultants:   General surgery, Drs. Kieth Brightly, [facility], Dr. Watt Climes  Procedures:    None  Antimicrobials:  None   Subjective: Pt says she feels much better, pain controlled  Objective: Vitals:   10/14/16 2038 10/14/16 2334 10/15/16 0350 10/15/16 0941  BP: (!) 160/89 (!) 160/85 (!) 159/85 (!) 149/88  Pulse: 75 77 85 88  Resp: '14  16 17  '$ Temp: 98.2 F (36.8 C)  98.3 F (36.8 C) 98.6 F (37 C)  TempSrc:    Oral  SpO2: 97%  92% 96%  Weight:      Height:        Intake/Output Summary (Last 24 hours) at 10/15/16 1504 Last data filed at 10/15/16 1459  Gross per 24 hour  Intake           905.99 ml  Output             1050 ml  Net          -144.01 ml   Filed Weights   10/12/16 2148 10/13/16 2100 10/14/16 1940  Weight: 68.5 kg (151 lb) 68 kg (150 lb) 68.5 kg (151 lb)    Examination: General exam: Elderly female in no distress. Cooperative. Alert. Oriented.  Respiratory system: Non-labored breathing 2L by [place]. Diminished but clear to auscultation bilaterally.  Cardiovascular system: Regular rate and rhythm. No murmur, rub, or gallop. No JVD, and no pedal edema. Gastrointestinal system: Abdomen soft. Non-distended, with normoactive bowel sounds. No organomegaly or masses felt. Wound clean and dry. Central nervous system: Alert and oriented. No focal neurological deficits. Extremities: Warm, no deformities Skin: No rashes, lesions no ulcers Psychiatry: Judgement and insight appear normal. Mood & affect appropriate.   Data Reviewed: I have personally reviewed following labs and imaging studies  CBC:  Recent Labs Lab 10/11/16 0709 10/12/16 0608 10/13/16 0638 10/14/16 0602 10/15/16 0542  WBC 3.9* 5.5 4.5 4.0 6.3  HGB 12.8 13.0 12.7 11.8* 12.1  HCT 36.8 38.3 37.2 34.7* 35.6*  MCV 77.8* 77.5* 77.8* 76.9* 76.9*  PLT 190 207 198 191 850   Basic Metabolic Panel:  Recent Labs Lab 10/09/16 0455 10/10/16 0530 10/11/16 0709 10/13/16 0638 10/14/16 0602  NA 140 141 137 137 138  K 3.5 3.8 3.7 3.1* 3.8  CL 98* 96* 96* 99* 102  CO2 30 34* '29 29 29   '$ GLUCOSE 224* 131* 150* 143* 145*  BUN '19 14 10 14 8  '$ CREATININE 1.05* 1.09* 1.00 1.08* 1.00  CALCIUM 9.2 9.0 8.6* 8.4* 8.0*  MG  --   --   --   --  1.4*   GFR: Estimated Creatinine Clearance: 42.2 mL/min (by C-G formula based on SCr of 1 mg/dL). Liver Function Tests:  Recent Labs Lab 10/09/16 0455 10/10/16 0530 10/11/16 0709 10/13/16 0638 10/14/16 0602  AST '25 26 22 19 20  '$ ALT '17 16 14 '$ 13* 12*  ALKPHOS 32* 31* 30* 29* 26*  BILITOT 0.6 0.8 0.6 0.7 0.7  PROT 7.5 7.9 7.0 6.4* 6.0*  ALBUMIN 3.6 3.7 3.5 3.2* 2.9*    Recent Labs Lab 10/09/16 0455  LIPASE 23   No results for input(s): AMMONIA in the last 168 hours. Coagulation Profile:  Recent Labs Lab 10/11/16 0709 10/12/16 0608 10/13/16 0638 10/14/16 0602 10/15/16 0542  INR 2.18 1.80 1.64 1.30 1.28   Cardiac Enzymes: No results for input(s): CKTOTAL, CKMB, CKMBINDEX, TROPONINI in the last 168 hours. BNP (last 3 results) No results for input(s): PROBNP in the last  8760 hours. HbA1C: No results for input(s): HGBA1C in the last 72 hours. CBG:  Recent Labs Lab 10/14/16 1437 10/14/16 1712 10/14/16 2138 10/15/16 0800 10/15/16 1151  GLUCAP 137* 205* 160* 127* 126*   Lipid Profile: No results for input(s): CHOL, HDL, LDLCALC, TRIG, CHOLHDL, LDLDIRECT in the last 72 hours. Thyroid Function Tests: No results for input(s): TSH, T4TOTAL, FREET4, T3FREE, THYROIDAB in the last 72 hours. Anemia Panel:  Recent Labs  10/13/16 0638  VITAMINB12 1,159*  FOLATE 36.5  FERRITIN 22  TIBC 353  IRON 58  RETICCTPCT 1.4   Urine analysis:    Component Value Date/Time   COLORURINE YELLOW 10/09/2016 0501   APPEARANCEUR CLEAR 10/09/2016 0501   LABSPEC 1.021 10/09/2016 0501   PHURINE 5.0 10/09/2016 0501   GLUCOSEU 50 (A) 10/09/2016 0501   HGBUR NEGATIVE 10/09/2016 0501   BILIRUBINUR NEGATIVE 10/09/2016 0501   KETONESUR NEGATIVE 10/09/2016 0501   PROTEINUR 100 (A) 10/09/2016 0501   UROBILINOGEN 0.2 10/29/2011 1414    NITRITE NEGATIVE 10/09/2016 0501   LEUKOCYTESUR LARGE (A) 10/09/2016 0501   Recent Results (from the past 240 hour(s))  Urine culture     Status: Abnormal   Collection Time: 10/09/16  5:33 AM  Result Value Ref Range Status   Specimen Description URINE, RANDOM  Final   Special Requests NONE  Final   Culture (A)  Final    30,000 COLONIES/mL STREPTOCOCCUS AGALACTIAE TESTING AGAINST S. AGALACTIAE NOT ROUTINELY PERFORMED DUE TO PREDICTABILITY OF AMP/PEN/VAN SUSCEPTIBILITY.    Report Status 10/10/2016 FINAL  Final  Surgical pcr screen     Status: None   Collection Time: 10/13/16 11:00 PM  Result Value Ref Range Status   MRSA, PCR NEGATIVE NEGATIVE Final   Staphylococcus aureus NEGATIVE NEGATIVE Final    Comment:        The Xpert SA Assay (FDA approved for NASAL specimens in patients over 73 years of age), is one component of a comprehensive surveillance program.  Test performance has been validated by [facility] for patients greater than or equal to 41 year old. It is not intended to diagnose infection nor to guide or monitor treatment.       Radiology Studies: No results found. Scheduled Meds: . aspirin  81 mg Oral Daily  . docusate sodium  100 mg Oral BID  . enoxaparin (LOVENOX) injection  100 mg Subcutaneous Q24H  . insulin aspart  0-9 Units Subcutaneous TID WC  . mouth rinse  15 mL Mouth Rinse BID  . metoprolol tartrate  50 mg Oral BID  . ondansetron (ZOFRAN) IV  4 mg Intravenous Once  . pantoprazole (PROTONIX) IV  40 mg Intravenous Q24H  . senna  1 tablet Oral Daily  . sodium chloride flush  3 mL Intravenous Q12H  . tiotropium  18 mcg Inhalation Daily  . triamterene-hydrochlorothiazide  1 tablet Oral Daily  . warfarin  7.5 mg Oral ONCE-1800  . Warfarin - Pharmacist Dosing Inpatient   Does not apply q1800   Continuous Infusions:   LOS: 5 days   Time spent: 17 minutes.   Irwin Brakeman, MD [facility] Pager [phone]  If 7PM-7AM, please contact  night-coverage [email or web address] Password [email or web address] 10/15/2016, 3:04 PM

## 2016-10-15 NOTE — Evaluation (Signed)
Physical Therapy Evaluation Patient Details Name: Alejandra [name] MRN: [id number] DOB: [birth year] Today's Date: 10/15/2016   History of Present Illness  83 y.o. female admitted to [facility] on 10/09/16 for abdominal pain.  Found to have an inflammed gall bladder and underwent lap chole for cholecystectomy on 10/14/16.  Pt with significant PMHx of PE, O2 dependent, L foot infection, HTN, DM, COPD, AAA s/p repair.    Clinical Impression  Pt using RW for gait down the hallway, however, walking around room with a light hand on furniture for support.  She is likely close to her baseline of walking with her "stick" or SPC.  PT will follow acutely, but do not currently recommend f/u.  PT to follow acutely for deficits listed below.     Follow Up Recommendations No PT follow up    Equipment Recommendations  None recommended by PT    Recommendations for Other Services   NA    Precautions / Restrictions   NA     Mobility  Bed Mobility Overal bed mobility: Modified Independent             General bed mobility comments: HOB mildly elevated and pt using railing.   Transfers Overall transfer level: Needs assistance Equipment used: 4-wheeled walker Transfers: Sit to/from Stand Sit to Stand: Supervision         General transfer comment: supervision for safety  Ambulation/Gait Ambulation/Gait assistance: Supervision Ambulation Distance (Feet): 250 Feet Assistive device: 4-wheeled walker Gait Pattern/deviations: Step-through pattern;Trunk flexed Gait velocity: decreased   General Gait Details: Pt used RW for support, verbal cues for upright posture (although flexed posture may be pre morbid), gait speed did slow the further we went, but pt never took a rest break.  Walking around room without an assistive device, reaching for furniture for support.          Balance Overall balance assessment: Needs assistance Sitting-balance support: Feet supported;No upper extremity  supported Sitting balance-Leahy Scale: Good     Standing balance support: No upper extremity supported Standing balance-Leahy Scale: Fair                               Pertinent Vitals/Pain Pain Assessment: Faces Faces Pain Scale: Hurts little more Pain Location: abdomen Pain Descriptors / Indicators: Sore Pain Intervention(s): Limited activity within patient's tolerance;Monitored during session;Repositioned    Home Living Family/patient expects to be discharged to:: Private residence Living Arrangements: Other relatives (grandson) Available Help at Discharge: Family;Available PRN/intermittently (daugter calls daily) Type of Home: House Home Access: Stairs to enter Entrance Stairs-Rails: Right Entrance Stairs-Number of Steps: 2 Home Layout: One level Home Equipment: Walker - 2 wheels;Cane - single point      Prior Function Level of Independence: Needs assistance   Gait / Transfers Assistance Needed: pt uses SPC for gait              Extremity/Trunk Assessment   Upper Extremity Assessment Upper Extremity Assessment: Overall WFL for tasks assessed    Lower Extremity Assessment Lower Extremity Assessment: Overall WFL for tasks assessed;Generalized weakness    Cervical / Trunk Assessment Cervical / Trunk Assessment: Kyphotic  Communication   Communication: No difficulties  Cognition Arousal/Alertness: Awake/alert Behavior During Therapy: WFL for tasks assessed/performed Overall Cognitive Status: No family/caregiver present to determine baseline cognitive functioning  Assessment/Plan    PT Assessment Patient needs continued PT services  PT Problem List Decreased activity tolerance;Decreased balance;Decreased mobility;Decreased strength;Decreased knowledge of precautions;Decreased knowledge of use of DME;Pain       PT Treatment Interventions DME instruction;Gait training;Stair  training;Functional mobility training;Therapeutic activities;Therapeutic exercise;Balance training;Patient/family education    PT Goals (Current goals can be found in the Care Plan section)  Acute Rehab PT Goals Patient Stated Goal: wants to go home tomorrow PT Goal Formulation: With patient Time For Goal Achievement: 10/29/16 Potential to Achieve Goals: Good    Frequency Min 3X/week           End of Session   Activity Tolerance: Patient limited by fatigue;Patient limited by pain Patient left: in bed;with call bell/phone within reach   PT Visit Diagnosis: Muscle weakness (generalized) (M62.81);Difficulty in walking, not elsewhere classified (R26.2);Pain Pain - Right/Left:  (other) Pain - part of body:  (abdomen)    Time: 2094-7096 PT Time Calculation (min) (ACUTE ONLY): 18 min   Charges:   PT Evaluation $PT Eval Moderate Complexity: 1 Procedure            Glover Capano B. [place], [place], DPT [phone]   10/15/2016, 5:12 PM

## 2016-10-16 LAB — CBC
HCT: 34.3 % — ABNORMAL LOW (ref 36.0–46.0)
HEMOGLOBIN: 11.6 g/dL — AB (ref 12.0–15.0)
MCH: 26.1 pg (ref 26.0–34.0)
MCHC: 33.8 g/dL (ref 30.0–36.0)
MCV: 77.1 fL — AB (ref 78.0–100.0)
Platelets: 192 10*3/uL (ref 150–400)
RBC: 4.45 MIL/uL (ref 3.87–5.11)
RDW: 16.5 % — ABNORMAL HIGH (ref 11.5–15.5)
WBC: 4.7 10*3/uL (ref 4.0–10.5)

## 2016-10-16 LAB — PROTIME-INR
INR: 1.2
PROTHROMBIN TIME: 15.2 s (ref 11.4–15.2)

## 2016-10-16 LAB — GLUCOSE, CAPILLARY
Glucose-Capillary: 107 mg/dL — ABNORMAL HIGH (ref 65–99)
Glucose-Capillary: 157 mg/dL — ABNORMAL HIGH (ref 65–99)

## 2016-10-16 LAB — BASIC METABOLIC PANEL
Anion gap: 10 (ref 5–15)
BUN: 8 mg/dL (ref 6–20)
CHLORIDE: 97 mmol/L — AB (ref 101–111)
CO2: 31 mmol/L (ref 22–32)
CREATININE: 1.11 mg/dL — AB (ref 0.44–1.00)
Calcium: 8.8 mg/dL — ABNORMAL LOW (ref 8.9–10.3)
GFR calc Af Amer: 52 mL/min — ABNORMAL LOW (ref >60)
GFR calc non Af Amer: 45 mL/min — ABNORMAL LOW (ref >60)
GLUCOSE: 133 mg/dL — AB (ref 65–99)
POTASSIUM: 5 mmol/L (ref 3.5–5.1)
Sodium: 138 mmol/L (ref 135–145)

## 2016-10-16 MED ORDER — MAGNESIUM SULFATE IN D5W 1-5 GM/100ML-% IV SOLN
1.0000 g | Freq: Once | INTRAVENOUS | Status: AC
  Administered 2016-10-16: 1 g via INTRAVENOUS
  Filled 2016-10-16: qty 100

## 2016-10-16 MED ORDER — ENOXAPARIN SODIUM 100 MG/ML ~~LOC~~ SOLN: 100.0000 mg | SUBCUTANEOUS | 0 refills | Status: DC

## 2016-10-16 MED ORDER — WARFARIN SODIUM 7.5 MG PO TABS
7.5000 mg | ORAL_TABLET | Freq: Once | ORAL | Status: AC
Start: 2016-10-16 — End: 2016-10-16
  Administered 2016-10-16: 7.5 mg via ORAL
  Filled 2016-10-16: qty 1

## 2016-10-16 NOTE — Care Management Note (Signed)
Case Management Note  Patient Details  Name: Alejandra [name] MRN: [id number] Date of Birth: Dec 01, 1931  Subjective/Objective:  83 y.o. F admitted 3/24/218 to be discharged home with [facility] provided by [place], per pt choice,  to provide Disease Management and INR on Mon and Wed, report result to PCP and advise pt to discontinue Lovenox when INR is GREATER THAN 2.0. Jermaine, [facility] rep is aware of referral.                   Action/Plan:CM will sign off for now but will be available should additional discharge needs arise or disposition change.    Expected Discharge Date:  10/16/16               Expected Discharge Plan:  [place]  In-House Referral:  NA  Discharge planning Services  CM Consult  Post Acute Care Choice:  Home Health Choice offered to:  Patient  DME Arranged:  N/A DME Agency:  NA  HH Arranged:  RN [place] Agency:  [place]  Status of Service:  Completed, signed off  If discussed at [place] of Stay Meetings, dates discussed:    Additional Comments:  Delrae Sawyers, RN 10/16/2016, 2:37 PM

## 2016-10-16 NOTE — Discharge Summary (Signed)
Physician Discharge Summary  Alejandra [name] YSA:[id number] DOB: January 14, 1932 DOA: 10/09/2016  PCP: Mauricio Po, FNP  Admit date: 10/09/2016 Discharge date: 10/16/2016  Admitted From: Home  Disposition:  Home   Recommendations for Outpatient Follow-up:  1. Follow up with PCP in 3-4 days for recheck PT/INR.  2. Stop lovenox when INR>2.   3. Please obtain BMP/CBC in one week 4. Follow up with surgeon in 3 weeks  Home Health: [facility] RN to check PT/INR Mon and Wed to report to PCP to help decide about lovenox.  Discharge Condition: STABLE CODE STATUS: FULL   Brief/Interim Summary: HPI: Alejandra [name] is a 83 y.o. female with history of endovascular aneurysm repair on 10/30/2002 AAA, history of pulmonary embolism, noted on CT chest 01/24/10.  Patient states and since then she's been on Coumadin, COPD with chronic respiratory failure on home O2 of 2 L daily, diabetes, dyslipidemia, hypertension, hyperlipidemia, presented to the ED last night around 9 secondary to him creasing nausea with emesis as well as upper abdominal pains.  Patient of note, states pain started around midnight with sharp pains, nonradiating in the mid epigastrium. Of note, her son is at bedside and states that she minimizes her symptoms sometimes.  She states her nausea, emesis, as well as abdominal pain is much improved after they gave her pain meds in the ER.  Patient currently is not smoking. She quit about 6-7 years ago.  She currently lives with her 47 year old grandson who is now staying with them when asked why she is here in the hospital. Her grown son is at bedside.  She denies any acute problems currently. Denies cp/palpitations/pnd/orthopnea.  She states the other night her legs were swollen, but currently the are not. She is able to do her ADLs. She has a hard time going up /down stairs 2nd to doe and gets tired, but denies cp w/ exertion. No prior hx of MI/CVA per pt.  METS about 4.   Brief  Narrative: Alejandra Ehler McKinnonis an 83 y.o.femalewith a history of AAA s/p endovascular repair 2004, PE 2011 on coumadin, COPD on 2L O2, DM, HTN, and HLD who presented 3/24 with epigastric abdominal pain, nausea and vomiting. Symptoms were significantly improved with pain medications in ED. In the ED she was afebrile, BP 171/89, HR 77. WBC 7.7. Abd U/S showed mild gallbladder distention with echogenic stones without wall thickening or pericholecystic fluid. CBG 564m without ductal system mass/calculus. Subsequent CT abdomen showed similar findings with trace pericholecystic fluid concerning for acute cholecystitis. AAA was also seen s/p endograft repair without significant interval dilatation. CXR showed bibasilar atelectasis without infiltrate or edema. General surgery was consulted, recommending monitoring while holding coumadin (INR 2.2 on arrival).  Assessment & Plan: Principal Problem:   Acute cholecystitis Active Problems:   COPD (chronic obstructive pulmonary disease) ([name])   Dyslipidemia   CKD stage 3 secondary to diabetes ([name])   Anticoagulated on Coumadin   HTN (hypertension), malignant   Chronic respiratory failure with hypoxia ([name])   Atrial fibrillation with rapid ventricular response ([name])  Symptomatic cholelithiasis: CT with gallbladder distention, radiopaque stones/ debris, new intrahepatic bile duct dilation, and trace pericholecystic fluid. CBD up to 133mfrom 64m55mNo leukocytosis, LFTs wnl so doubt CBD obstruction. HIDA scan abnormal with nonvisualization of gallbladder (physiologic due to NPO vs. analgesics vs. CBD obstruction).  Pt going to OR 3/29 for lap chole.  -POD1 s/p lap chole on 3/29, doing better, ok per surgery to resume warfarin. Started  warfarin 3/30 and lovenox bridge started 3/30.     HTN: Elevated on admission, now normotensive. - Resume metoprolol '50mg'$  BID and maxide 37.5 - '25mg'$  - Would consider hydralazine IV as needed if becomes uncontrolled.  Pt with  telemetric evidence of AFib. Not a known diagnosis PTA. HR down to 90's with administration of schedule metoprolol IV. STAT ECG shows unchanged RBBB with sinus rhythm. Asymptomatic. Recent TSH wnl. K wnl yesterday.  - Continue metoprolol as above - lovenox bridge while coumadin subtherapeutic, Arrange for [facility] RN to check PT/INR on Mon and Tues and report to PCP/coumadin clinic and DC lovenox when INR>2.   - Echocardiogram completed: LVEF 55-60%, mild LVH, normal wall motion, diastolic dysfunction,indeterminate LV filling pressure, normal LA size, mildly dilatedRV with normal systolic function, severe RAE, moderate TR, RVSP52 mmHg, normal IVC - Cardiology consulted for new AFib and pre-operative clearance - see notes.   T2DM: Well-controlled, HbA1c 6.9%.  - Hold metformin, resume at discharge.  - SSI used in hospital.  CBG (last 3)   Recent Labs (last 2 labs)    Recent Labs  10/14/16 2138 10/15/16 0800 10/15/16 1151  GLUCAP 160* 127* 126*     Chronic anticoagulation with coumadin for hx of prior AAA repair 2004 and PE (dx 01/2010). INR 2.24 on admission, will allow to trend down by holding coumadin. No indication for reversal at this time.  - lovenox bridging  Microcytosis: on labs 3/26 without anemia.   Chronic hypoxemic respiratory failure due to COPD: No exacerbation is evident, stable on home 2L by [place]. CXR with atelectasis only.  - Continue home spiriva, nebulizers - Continue 2L O2 by [place].   GERD: Chronic, stable.  - IV PPI due to NPO  AAA: No significant growth since last surveillance 12/30/2014, though CT showed diffuse aortic disease with increasing diameter of the suprarenal aorta, now measuring 3.5 cm.  - Continued surveillance of suprarenal aorta is also recommended. - Continue statin - resume home ASA   DVT prophylaxis: lovenox, warfarin Code Status: Full Family Communication: None at bedside this AM Disposition Plan: home in 1-2 days    Consultants:    General surgery, Drs. Kieth Brightly, [facility], Dr. Watt Climes  Discharge Diagnoses:  Principal Problem:   Cholecystitis Active Problems:   COPD (chronic obstructive pulmonary disease) ([place])   Dyslipidemia   CKD stage 3 secondary to diabetes ([place])   Anticoagulated on Coumadin   HTN (hypertension), malignant   Chronic respiratory failure with hypoxia ([name])   Atrial fibrillation with rapid ventricular response [facility])  Discharge Instructions  Discharge Instructions    Increase activity slowly    Complete by:  As directed      Allergies as of 10/16/2016      Reactions   Penicillins Nausea And Vomiting, Rash, Other (See Comments)   Childhood reaction      Medication List    STOP taking these medications   tiZANidine 2 MG tablet Commonly known as:  ZANAFLEX   traMADol 50 MG tablet Commonly known as:  ULTRAM     TAKE these medications   albuterol (2.5 MG/3ML) 0.083% nebulizer solution Commonly known as:  PROVENTIL Take 2.5 mg by nebulization every 6 (six) hours as needed. For shortness of breath   Alcohol Prep 70 % Pads   aspirin 81 MG tablet Take 81 mg by mouth daily.   EASY COMFORT INSULIN SYRINGE 30G X 1/2" 0.5 ML Misc Generic drug:  Insulin Syringe-Needle U-100   enoxaparin 100 MG/ML  injection Commonly known as:  LOVENOX Inject 1 mL (100 mg total) into the skin daily. Start taking on:  10/17/2016   glipiZIDE 5 MG tablet Commonly known as:  GLUCOTROL Take 5 mg by mouth daily.   HYDROcodone-acetaminophen 5-325 MG tablet Commonly known as:  NORCO/VICODIN Take 1 tablet by mouth 4 (four) times daily as needed. For pain   Lancing Device Misc   lovastatin 20 MG tablet Commonly known as:  MEVACOR Take 20 mg by mouth at bedtime.   metFORMIN 500 MG tablet Commonly known as:  GLUCOPHAGE Take 500 mg by mouth 2 (two) times daily with a meal.   metoprolol 50 MG tablet Commonly known as:  LOPRESSOR Take 1 tablet by mouth 2 (two) times daily.    multivitamin tablet Take 1 tablet by mouth daily.   omega-3 acid ethyl esters 1 g capsule Commonly known as:  LOVAZA Take 2 g by mouth 2 (two) times daily.   ONE TOUCH ULTRA TEST test strip Generic drug:  glucose blood Use as directed   glucose blood test strip   onetouch ultrasoft lancets Use as directed   pantoprazole 40 MG tablet Commonly known as:  PROTONIX Take 40 mg by mouth daily.   tiotropium 18 MCG inhalation capsule Commonly known as:  SPIRIVA Place 1 capsule (18 mcg total) into inhaler and inhale daily.   triamterene-hydrochlorothiazide 37.5-25 MG tablet Commonly known as:  MAXZIDE-25 Take 1 tablet by mouth daily.   Vitamin D3 1000 units Caps Take 1 capsule by mouth daily.   warfarin 5 MG tablet Commonly known as:  COUMADIN Take 5 mg by mouth as directed. '5mg'$  daily except 7.'5mg'$  on Mondays      Follow-up Information    Clovis Riley, MD. Schedule an appointment as soon as possible for a visit in 3 week(s).   Specialty:  General Surgery Contact information: [phone]        Mauricio Po, FNP. Schedule an appointment as soon as possible for a visit in 3 day(s).   Specialty:  Family Medicine Why:  coumadin clinic hospital Follow up check PT/INR  Contact information: [address] [place] [address] [phone]          Allergies  Allergen Reactions  . Penicillins Nausea And Vomiting, Rash and Other (See Comments)    Childhood reaction   Procedures/Studies: Dg Chest 2 View  Result Date: 10/09/2016 CLINICAL DATA:  Preoperative cardiovascular examination. Chest and epigastric region pain. EXAM: CHEST  2 VIEW COMPARISON:  Chest radiograph October 30, 2011; chest CT April 07, 2015 FINDINGS: There is mild atelectatic change in the lung bases. There is no appreciable edema or consolidation. Heart size and pulmonary vascularity are normal. There is atherosclerotic calcification in the aorta. No adenopathy. No bone lesions. IMPRESSION:  Bibasilar atelectasis. No edema or consolidation. There is aortic atherosclerosis. Electronically Signed   By: Lowella Grip III M.D.   On: 10/09/2016 12:56   Nm Hepatobiliary Liver Func  Result Date: 10/11/2016 CLINICAL DATA:  Nausea, vomiting, and abdominal pain since 10/08/2016, suspected cholecystitis by CT EXAM: NUCLEAR MEDICINE HEPATOBILIARY IMAGING TECHNIQUE: Sequential images of the abdomen were obtained out to 60 minutes following intravenous administration of radiopharmaceutical. RADIOPHARMACEUTICALS:  5.29 mCi Tc-8mCholetec IV COMPARISON:  CT abdomen and pelvis 10/09/2016 FINDINGS: Normal tracer extraction from bloodstream indicating normal hepatocellular function. Prompt concentration and excretion of tracer into the central biliary tree. Gallbladder visualized at 40 minutes. At 1 hour, bowel had not visualized. Additional imaging over on additional  power showed no small bowel visualization. This can be seen as a physiologic finding in patients who are NPO without stimulus for gallbladder contraction or relaxation of the sphincter of Oddi, in patients receiving narcotics, and with CBD obstruction. IMPRESSION: Patent cystic duct. Nonvisualization of the gallbladder at 2 hours, which can be physiologic, due to prior narcotic administration, or secondary to CBD obstruction. Electronically Signed   By: Lavonia Dana M.D.   On: 10/11/2016 18:24   Ct Abdomen Pelvis W Contrast  Result Date: 10/09/2016 CLINICAL DATA:  83 year old female with a history of right upper quadrant pain with nausea and vomiting EXAM: CT ABDOMEN AND PELVIS WITH CONTRAST TECHNIQUE: Multidetector CT imaging of the abdomen and pelvis was performed using the standard protocol following bolus administration of intravenous contrast. CONTRAST:  80 cc Isovue-300 COMPARISON:  Ultrasound 10/09/2016 FINDINGS: Lower chest: Mixed linear/ nodular opacities at the lung bases compatible of atelectasis/ scarring. Nodule of the right middle  lobe (image 2 series 4) is unchanged dating to the CT 12/30/2014, most likely benign. Hepatobiliary: Compare to prior there is mild dilation of the central bile ducts on the right an the left. With the common bile duct measuring as great is 10 mm, increased from the prior. The duct is dilated to the insertion to the duodenum. No radiopaque stone identified. Rapid tapering distally at the ampulla. Distention of the gallbladder with dependent radiopaque debris/stones. Mild gallbladder wall thickening and pericholecystic fluid. Pancreas: Unremarkable. No pancreatic ductal dilatation or surrounding inflammatory changes. Spleen: Normal in size without focal abnormality. Adrenals/Urinary Tract: Unremarkable appearance of the right adrenal gland. Unremarkable appearance of the left adrenal gland. Bilateral kidneys without hydronephrosis or nephrolithiasis. Unremarkable course the bilateral ureters. No perinephric stranding or inflammatory changes. Symmetric perfusion. Stomach/Bowel: Unremarkable stomach and small bowel. No abnormally distended small bowel or colon. Colonic diverticular disease. No associated inflammatory changes. Normal appendix. Vascular/Lymphatic: Mixed calcified and soft plaque of the lower thoracic aorta and abdominal aorta. The patient has had endovascular repair of infrarenal abdominal aortic aneurysm with endograft. The proximal margin of the graft terminates just below the renal arteries, without evidence of migration. The excluded aneurysm sac has similar diameter on axial images to the comparison CT, currently measuring 5.8 cm x 5.5 cm on image 44. Previous measurements at this location 5.8 cm x 5.6 cm. No periaortic fluid or inflammatory changes. Bilateral limbs are patent. Moderate to advanced bilateral iliac system atherosclerotic changes, though remain patent. Proximal femoral vasculature remain patent bilaterally. Bilateral hypogastric arteries patent. This study is not tailored for the most  sensitive evaluation of a type 2 endoleak. Stenosis at the origin of the celiac artery, potentially secondary to atherosclerotic changes and/or overlying diaphragmatic crus. No stenosis at the origin of the superior mesenteric artery. Atherosclerotic changes at the origin the bilateral renal arteries, more pronounced on the left. Suprarenal aorta measures as great as 3.5 cm on the current study, which is increased from the comparison of 3.0 cm. Reproductive: Changes of hysterectomy Other: Small fact containing umbilical hernia. Musculoskeletal: Multilevel degenerative changes of the visualized spine. Changes most pronounced spanning L3-S1. Bilateral facet disease worst in the lower lumbar levels. No displaced fracture. Grade 1 anterolisthesis of L4 on L5 is unchanged. IMPRESSION: CT evidence of acute cholecystitis, with gallbladder distention, radiopaque stones/ debris, new intrahepatic bile duct dilation, and trace pericholecystic fluid. If a confirmatory test is needed, HIDA scan would be the next imaging test. The common bile duct is dilated compared to the prior CT scan, measuring  as large as 10 mm. Abrupt distal tapering at the ampulla with no radiopaque stone identified. This is nonspecific although could represent new obstruction related to non radiopaque stones, inflammatory stricture, or much less likely neoplasm. Correlation with ERCP and/ or MRCP may be considered. The patient has had infrarenal abdominal aortic aneurysm repair with aortic endograft, with the last surveillance study 12/30/2014. There has been no significant growth of the excluded aneurysm sac, however, the patient has diffuse aortic disease with increasing diameter of the suprarenal aorta, now measuring 3.5 cm. Continued surveillance is recommended. These results were called by telephone at the time of interpretation on 10/09/2016 at 10:17 am to Dr. Gustavus Messing, who verbally acknowledged these results. Diverticular disease without evidence of  acute diverticulitis. Multilevel degenerative changes of the spine including grade 1 anterolisthesis of L4 on L5. Electronically Signed   By: Corrie Mckusick D.O.   On: 10/09/2016 10:19   Mr 3d Recon At Scanner  Result Date: 10/13/2016 CLINICAL DATA:  Nausea and vomiting with abdominal pain. Suspected cholecystitis by CT. Nuclear medicine study revealed patent cystic duct with nonvisualization of gut activity. EXAM: MRI ABDOMEN WITHOUT AND WITH CONTRAST (INCLUDING MRCP) TECHNIQUE: Multiplanar multisequence MR imaging of the abdomen was performed both before and after the administration of intravenous contrast. Heavily T2-weighted images of the biliary and pancreatic ducts were obtained, and three-dimensional MRCP images were rendered by post processing. CONTRAST:  27m MULTIHANCE GADOBENATE DIMEGLUMINE 529 MG/ML IV SOLN COMPARISON:  Nuclear medicine study 10/11/2016.  CT scan 10/09/2016. FINDINGS: Lower chest:  Heart is enlarged. Hepatobiliary: The liver unremarkable. Gallbladder is distended. Tiny layering stones are identified in the gallbladder lumen. No substantial intrahepatic biliary duct dilatation. Extrahepatic common duct measures 8 mm diameter which is upper normal for patient age. Common bile duct in the head of the pancreas is 6 mm, within normal limits for age. There is no filling defect in the common duct or common bile duct to suggest choledocholithiasis. Pancreas: No focal mass lesion. No dilatation of the main duct. No intraparenchymal cyst. No peripancreatic edema. No evidence for pancreas divisum Spleen: No splenomegaly. No focal mass lesion. Adrenals/Urinary Tract: Left adrenal thickening noted without nodule. Right adrenal gland unremarkable. Cortical scarring noted both kidneys no hydronephrosis. No enhancing mass lesion. Stomach/Bowel: No evidence for dilated small bowel. Diverticular changes are noted in the left colon. Vascular/Lymphatic: Patient is status post aortic endograft placement  for infrarenal aneurysm. There is no gastrohepatic or hepatoduodenal ligament lymphadenopathy. No intraperitoneal or retroperitoneal lymphadenopathy. Other: No intraperitoneal free fluid. Musculoskeletal: No abnormal marrow enhancement within the visualized bony anatomy. IMPRESSION: 1. Cholelithiasis without choledocholithiasis. The common duct distention has decreased since prior CT scan with both the common duct and common bile duct measuring normal diameter for patient age on today's study. No evidence for ampullary mass lesion or pancreatic head mass by MRI. There is no associated dilatation of the main pancreatic duct. 2. Status post endograft repair of abdominal aortic aneurysm. 3. Left colonic diverticulosis. Electronically Signed   By: EMisty StanleyM.D.   On: 10/13/2016 07:41   Mr Abdomen Mrcp WMoise BoringContast  Result Date: 10/13/2016 CLINICAL DATA:  Nausea and vomiting with abdominal pain. Suspected cholecystitis by CT. Nuclear medicine study revealed patent cystic duct with nonvisualization of gut activity. EXAM: MRI ABDOMEN WITHOUT AND WITH CONTRAST (INCLUDING MRCP) TECHNIQUE: Multiplanar multisequence MR imaging of the abdomen was performed both before and after the administration of intravenous contrast. Heavily T2-weighted images of the biliary and pancreatic ducts were  obtained, and three-dimensional MRCP images were rendered by post processing. CONTRAST:  70m MULTIHANCE GADOBENATE DIMEGLUMINE 529 MG/ML IV SOLN COMPARISON:  Nuclear medicine study 10/11/2016.  CT scan 10/09/2016. FINDINGS: Lower chest:  Heart is enlarged. Hepatobiliary: The liver unremarkable. Gallbladder is distended. Tiny layering stones are identified in the gallbladder lumen. No substantial intrahepatic biliary duct dilatation. Extrahepatic common duct measures 8 mm diameter which is upper normal for patient age. Common bile duct in the head of the pancreas is 6 mm, within normal limits for age. There is no filling defect in the  common duct or common bile duct to suggest choledocholithiasis. Pancreas: No focal mass lesion. No dilatation of the main duct. No intraparenchymal cyst. No peripancreatic edema. No evidence for pancreas divisum Spleen: No splenomegaly. No focal mass lesion. Adrenals/Urinary Tract: Left adrenal thickening noted without nodule. Right adrenal gland unremarkable. Cortical scarring noted both kidneys no hydronephrosis. No enhancing mass lesion. Stomach/Bowel: No evidence for dilated small bowel. Diverticular changes are noted in the left colon. Vascular/Lymphatic: Patient is status post aortic endograft placement for infrarenal aneurysm. There is no gastrohepatic or hepatoduodenal ligament lymphadenopathy. No intraperitoneal or retroperitoneal lymphadenopathy. Other: No intraperitoneal free fluid. Musculoskeletal: No abnormal marrow enhancement within the visualized bony anatomy. IMPRESSION: 1. Cholelithiasis without choledocholithiasis. The common duct distention has decreased since prior CT scan with both the common duct and common bile duct measuring normal diameter for patient age on today's study. No evidence for ampullary mass lesion or pancreatic head mass by MRI. There is no associated dilatation of the main pancreatic duct. 2. Status post endograft repair of abdominal aortic aneurysm. 3. Left colonic diverticulosis. Electronically Signed   By: EMisty StanleyM.D.   On: 10/13/2016 07:41   [place]dom[place] Limited Ruq  Result Date: 10/09/2016 CLINICAL DATA:  Upper abdominal pain EXAM: [place] LIMITED - RIGHT UPPER QUADRANT COMPARISON:  CT abdomen and pelvis December 30, 2014 FINDINGS: Gallbladder: Gallbladder is mildly distended. There are echogenic foci in the gallbladder which move and shadow consistent with gallstones. Largest gallstone measures 1.5 cm in length. There is no gallbladder wall thickening or pericholecystic fluid. There is no appreciable gallbladder wall thickening or pericholecystic fluid. No  sonographic Murphy sign noted by sonographer. Common bile duct: Diameter: Distended at 10 mm. No mass or calculus is appreciable in the biliary ductal system. Liver: No focal lesion identified. Liver echogenicity is somewhat inhomogeneous and overall increased. IMPRESSION: Cholelithiasis.  Gallbladder slightly distended. Common bile duct dilatation without mass or calculus seen by ultrasound. Increased liver echogenicity most likely is indicative of hepatic steatosis. While no focal liver lesions are evident on this study, it must be cautioned that the sensitivity of ultrasound for detection of focal liver lesions is diminished in this circumstance. Electronically Signed   By: WLowella GripIII M.D.   On: 10/09/2016 09:57     Subjective: Pt feeling a lot better today.  Eating and drinking well.  No bleeding.    Discharge Exam: Vitals:   10/16/16 0549 10/16/16 0910  BP: 138/67 116/60  Pulse: 68 87  Resp: 18 18  Temp: 98.3 F (36.8 C) 97.9 F (36.6 C)   Vitals:   10/16/16 0130 10/16/16 0549 10/16/16 0811 10/16/16 0910  BP: (!) 163/93 138/67  116/60  Pulse: 62 68  87  Resp:  18  18  Temp:  98.3 F (36.8 C)  97.9 F (36.6 C)  TempSrc:  Oral  Oral  SpO2:  97% 98% 94%  Weight:  Height:       General exam: Elderly female in no distress. Cooperative. Alert. Oriented.  Respiratory system: Non-labored breathing 2L by [place]. Diminished but clear to auscultation bilaterally.  Cardiovascular system: Regular rate and rhythm. No murmur, rub, or gallop. No JVD, and no pedal edema. Gastrointestinal system: Abdomen soft. Non-distended, with normoactive bowel sounds. No organomegaly or masses felt. Wound clean and dry. Central nervous system: Alert and oriented. No focal neurological deficits. Extremities: Warm, no deformities Skin: No rashes, lesions no ulcers Psychiatry: Judgement and insight appear normal. Mood & affect appropriate.   The results of significant diagnostics from this  hospitalization (including imaging, microbiology, ancillary and laboratory) are listed below for reference.     Microbiology: Recent Results (from the past 240 hour(s))  Urine culture     Status: Abnormal   Collection Time: 10/09/16  5:33 AM  Result Value Ref Range Status   Specimen Description URINE, RANDOM  Final   Special Requests NONE  Final   Culture (A)  Final    30,000 COLONIES/mL STREPTOCOCCUS AGALACTIAE TESTING AGAINST S. AGALACTIAE NOT ROUTINELY PERFORMED DUE TO PREDICTABILITY OF AMP/PEN/VAN SUSCEPTIBILITY.    Report Status 10/10/2016 FINAL  Final  Surgical pcr screen     Status: None   Collection Time: 10/13/16 11:00 PM  Result Value Ref Range Status   MRSA, PCR NEGATIVE NEGATIVE Final   Staphylococcus aureus NEGATIVE NEGATIVE Final    Comment:        The Xpert SA Assay (FDA approved for NASAL specimens in patients over 63 years of age), is one component of a comprehensive surveillance program.  Test performance has been validated by [facility] for patients greater than or equal to 41 year old. It is not intended to diagnose infection nor to guide or monitor treatment.      Labs: BNP (last 3 results)  Recent Labs  10/09/16 1310  BNP 30.1   Basic Metabolic Panel:  Recent Labs Lab 10/10/16 0530 10/11/16 0709 10/13/16 0638 10/14/16 0602 10/16/16 0430  NA 141 137 137 138 138  K 3.8 3.7 3.1* 3.8 5.0  CL 96* 96* 99* 102 97*  CO2 34* '29 29 29 31  '$ GLUCOSE 131* 150* 143* 145* 133*  BUN '14 10 14 8 8  '$ CREATININE 1.09* 1.00 1.08* 1.00 1.11*  CALCIUM 9.0 8.6* 8.4* 8.0* 8.8*  MG  --   --   --  1.4*  --    Liver Function Tests:  Recent Labs Lab 10/10/16 0530 10/11/16 0709 10/13/16 0638 10/14/16 0602  AST '26 22 19 20  '$ ALT 16 14 13* 12*  ALKPHOS 31* 30* 29* 26*  BILITOT 0.8 0.6 0.7 0.7  PROT 7.9 7.0 6.4* 6.0*  ALBUMIN 3.7 3.5 3.2* 2.9*   No results for input(s): LIPASE, AMYLASE in the last 168 hours. No results for input(s): AMMONIA in the last  168 hours. CBC:  Recent Labs Lab 10/12/16 0608 10/13/16 0638 10/14/16 0602 10/15/16 0542 10/16/16 0430  WBC 5.5 4.5 4.0 6.3 4.7  HGB 13.0 12.7 11.8* 12.1 11.6*  HCT 38.3 37.2 34.7* 35.6* 34.3*  MCV 77.5* 77.8* 76.9* 76.9* 77.1*  PLT 207 198 191 165 192   Cardiac Enzymes: No results for input(s): CKTOTAL, CKMB, CKMBINDEX, TROPONINI in the last 168 hours. BNP: Invalid input(s): POCBNP CBG:  Recent Labs Lab 10/15/16 1151 10/15/16 1631 10/15/16 2159 10/16/16 0816 10/16/16 1158  GLUCAP 126* 169* 114* 107* 157*   D-Dimer No results for input(s): DDIMER in the last 72 hours.  Hgb A1c No results for input(s): HGBA1C in the last 72 hours. Lipid Profile No results for input(s): CHOL, HDL, LDLCALC, TRIG, CHOLHDL, LDLDIRECT in the last 72 hours. Thyroid function studies No results for input(s): TSH, T4TOTAL, T3FREE, THYROIDAB in the last 72 hours.  Invalid input(s): FREET3 Anemia work up No results for input(s): VITAMINB12, FOLATE, FERRITIN, TIBC, IRON, RETICCTPCT in the last 72 hours. Urinalysis    Component Value Date/Time   COLORURINE YELLOW 10/09/2016 0501   APPEARANCEUR CLEAR 10/09/2016 0501   LABSPEC 1.021 10/09/2016 0501   PHURINE 5.0 10/09/2016 0501   GLUCOSEU 50 (A) 10/09/2016 0501   HGBUR NEGATIVE 10/09/2016 0501   BILIRUBINUR NEGATIVE 10/09/2016 0501   KETONESUR NEGATIVE 10/09/2016 0501   PROTEINUR 100 (A) 10/09/2016 0501   UROBILINOGEN 0.2 10/29/2011 1414   NITRITE NEGATIVE 10/09/2016 0501   LEUKOCYTESUR LARGE (A) 10/09/2016 0501   Sepsis Labs Invalid input(s): PROCALCITONIN,  WBC,  LACTICIDVEN Microbiology Recent Results (from the past 240 hour(s))  Urine culture     Status: Abnormal   Collection Time: 10/09/16  5:33 AM  Result Value Ref Range Status   Specimen Description URINE, RANDOM  Final   Special Requests NONE  Final   Culture (A)  Final    30,000 COLONIES/mL STREPTOCOCCUS AGALACTIAE TESTING AGAINST S. AGALACTIAE NOT ROUTINELY PERFORMED DUE  TO PREDICTABILITY OF AMP/PEN/VAN SUSCEPTIBILITY.    Report Status 10/10/2016 FINAL  Final  Surgical pcr screen     Status: None   Collection Time: 10/13/16 11:00 PM  Result Value Ref Range Status   MRSA, PCR NEGATIVE NEGATIVE Final   Staphylococcus aureus NEGATIVE NEGATIVE Final    Comment:        The Xpert SA Assay (FDA approved for NASAL specimens in patients over 35 years of age), is one component of a comprehensive surveillance program.  Test performance has been validated by [facility] for patients greater than or equal to 36 year old. It is not intended to diagnose infection nor to guide or monitor treatment.    Time coordinating discharge: 32 minutes  SIGNED:  Irwin Brakeman, MD  [facility] 10/16/2016, 12:37 PM Pager   If 7PM-7AM, please contact night-coverage [email or web address] Password [email or web address]

## 2016-10-16 NOTE — Progress Notes (Signed)
[facility] informed that patient HR drops to 30's but rebound quickly bp 163/93 HR 62 patient was asleep and asymptomatic. Arthor Captain LPN

## 2016-10-16 NOTE — Progress Notes (Signed)
Discharged patient with her son-Jacob Parrnel.Discharged papers printed ,explained and given to him.Especial emphasis given on patient's Lovenox .,its purpose ,side effect and how to give it to patient.Edison Nasuti himself is familiar with the Lovenox ,becouse he is self injecting Lovenox.Explained to him that [place] has been set-up for the patient's INR.

## 2016-10-16 NOTE — Progress Notes (Signed)
   Dr. Burt Knack note reviewed. No further recommendations. Please let us know if we can be of further assistance. Will sign off.   Candee Furbish, MD

## 2016-10-16 NOTE — Progress Notes (Signed)
[place] for lovenox + warfarin Indication: Hx PE/hypercoagulable state, new AFib  Allergies  Allergen Reactions  . Penicillins Nausea And Vomiting, Rash and Other (See Comments)    Childhood reaction   Patient Measurements: Height: '5\' 5"'$  (165.1 cm) Weight: 155 lb (70.3 kg) IBW/kg (Calculated) : 57  Vital Signs: Temp: 97.9 F (36.6 C) (03/31 0910) Temp Source: Oral (03/31 0910) BP: 116/60 (03/31 0910) Pulse Rate: 87 (03/31 0910)  Labs:  Recent Labs  10/14/16 0602 10/15/16 0542 10/16/16 0430  HGB 11.8* 12.1 11.6*  HCT 34.7* 35.6* 34.3*  PLT 191 165 192  LABPROT 16.3* 16.0* 15.2  INR 1.30 1.28 1.20  HEPARINUNFRC  --  0.45  --   CREATININE 1.00  --  1.11*    Estimated Creatinine Clearance: 38.4 mL/min (A) (by C-G formula based on SCr of 1.11 mg/dL (H)).   Assessment: 20 YOF on warfarin PTA for history of hypercoagulable state and PE. Warfarin on hold in anticipation of GI surgery. With new AFib. Warfarin restarted last night with lovenox bridge (transitioned from heparin). Hgb down slightly, Pltc up. No bleeding per nurse. INR  1.2 after yesterdays dose. Will repeat.   PTA Coumadin dose '5mg'$  daily except 7.'5mg'$  on Monday per clinic note on 3/7 and confirmed with patient.  Goal of Therapy:  Heparin level 0.3-0.7 units/ml Monitor platelets by anticoagulation protocol: Yes   Plan:  -Continue lovenox '1mg'$ /kg -Warfarin 7.'5mg'$  po x1 tonight  - Daily INR and CBC -Follow for s/s bleeding  Dierdre Harness, [place], PharmD Clinical Pharmacy Resident [phone] (Pager) 10/16/2016 9:36 AM

## 2016-10-16 NOTE — Progress Notes (Signed)
2 Days Post-Op   Subjective: States she is doing well today. On questioning about pain she has some occasional brief mild left upper quadrant pain. Tolerating diet.  Objective: Vital signs in last 24 hours: Temp:  [97.4 F (36.3 C)-98.6 F (37 C)] 97.9 F (36.6 C) (03/31 0910) Pulse Rate:  [62-87] 87 (03/31 0910) Resp:  [16-18] 18 (03/31 0910) BP: (110-163)/(60-93) 116/60 (03/31 0910) SpO2:  [91 %-99 %] 94 % (03/31 0910) Weight:  [70.3 kg (155 lb)] 70.3 kg (155 lb) (03/30 2202) Last BM Date: 10/13/16  Intake/Output from previous day: 03/30 0701 - 03/31 0700 In: 600 [P.O.:600] Out: 800 [Urine:800] Intake/Output this shift: Total I/O In: 120 [P.O.:120] Out: 100 [Urine:100]  General appearance: alert, cooperative and no distress GI: Mild appropriate incisional tenderness Incision/Wound: No erythema or drainage  Lab Results:   Recent Labs  10/15/16 0542 10/16/16 0430  WBC 6.3 4.7  HGB 12.1 11.6*  HCT 35.6* 34.3*  PLT 165 192   BMET  Recent Labs  10/14/16 0602 10/16/16 0430  NA 138 138  K 3.8 5.0  CL 102 97*  CO2 29 31  GLUCOSE 145* 133*  BUN 8 8  CREATININE 1.00 1.11*  CALCIUM 8.0* 8.8*     Studies/Results: No results found.  Anti-infectives: Anti-infectives    Start     Dose/Rate Route Frequency Ordered Stop   10/14/16 1215  vancomycin (VANCOCIN) IVPB 1000 mg/200 mL premix     1,000 mg 200 mL/hr over 60 Minutes Intravenous To Surgery 10/14/16 1207 10/14/16 1314      Assessment/Plan: s/p Procedure(s): LAPAROSCOPIC CHOLECYSTECTOMY Cholelithiasis and chronic cholecystitis with atypical symptoms. No postoperative complications. Okay for discharge anytime medically stable. Follow-up with Dr. Windle Guard in the office in 3 weeks. Sign off for now.   LOS: 6 days    Seniah Lawrence T 3/31/2018Patient ID: Alejandra [name], female   DOB: 05-16-1932, 83 y.o.   MRN: [id number]

## 2016-10-18 ENCOUNTER — Ambulatory Visit (INDEPENDENT_AMBULATORY_CARE_PROVIDER_SITE_OTHER): Payer: Medicare HMO | Admitting: General Practice

## 2016-10-18 DIAGNOSIS — E1122 Type 2 diabetes mellitus with diabetic chronic kidney disease: Secondary | ICD-10-CM | POA: Diagnosis not present

## 2016-10-18 DIAGNOSIS — Z48815 Encounter for surgical aftercare following surgery on the digestive system: Secondary | ICD-10-CM | POA: Diagnosis not present

## 2016-10-18 DIAGNOSIS — I480 Paroxysmal atrial fibrillation: Secondary | ICD-10-CM | POA: Diagnosis not present

## 2016-10-18 DIAGNOSIS — N183 Chronic kidney disease, stage 3 (moderate): Secondary | ICD-10-CM | POA: Diagnosis not present

## 2016-10-18 DIAGNOSIS — J449 Chronic obstructive pulmonary disease, unspecified: Secondary | ICD-10-CM | POA: Diagnosis not present

## 2016-10-18 DIAGNOSIS — J9601 Acute respiratory failure with hypoxia: Secondary | ICD-10-CM | POA: Diagnosis not present

## 2016-10-18 DIAGNOSIS — I10 Essential (primary) hypertension: Secondary | ICD-10-CM | POA: Diagnosis not present

## 2016-10-18 DIAGNOSIS — E785 Hyperlipidemia, unspecified: Secondary | ICD-10-CM | POA: Diagnosis not present

## 2016-10-18 DIAGNOSIS — Z86718 Personal history of other venous thrombosis and embolism: Secondary | ICD-10-CM | POA: Diagnosis not present

## 2016-10-18 DIAGNOSIS — Z5181 Encounter for therapeutic drug level monitoring: Secondary | ICD-10-CM

## 2016-10-18 DIAGNOSIS — Z9981 Dependence on supplemental oxygen: Secondary | ICD-10-CM | POA: Diagnosis not present

## 2016-10-18 LAB — POCT INR: INR: 1.4

## 2016-10-18 NOTE — Patient Instructions (Signed)
Pre visit review using our clinic review tool, if applicable. No additional management support is needed unless otherwise documented below in the visit note. 

## 2016-10-18 NOTE — Progress Notes (Signed)
I agree with this plan.

## 2016-10-20 ENCOUNTER — Ambulatory Visit: Payer: Medicare HMO

## 2016-10-20 ENCOUNTER — Ambulatory Visit (INDEPENDENT_AMBULATORY_CARE_PROVIDER_SITE_OTHER): Payer: Medicare HMO | Admitting: General Practice

## 2016-10-20 DIAGNOSIS — Z86718 Personal history of other venous thrombosis and embolism: Secondary | ICD-10-CM | POA: Diagnosis not present

## 2016-10-20 DIAGNOSIS — E1122 Type 2 diabetes mellitus with diabetic chronic kidney disease: Secondary | ICD-10-CM | POA: Diagnosis not present

## 2016-10-20 DIAGNOSIS — J9601 Acute respiratory failure with hypoxia: Secondary | ICD-10-CM | POA: Diagnosis not present

## 2016-10-20 DIAGNOSIS — E785 Hyperlipidemia, unspecified: Secondary | ICD-10-CM | POA: Diagnosis not present

## 2016-10-20 DIAGNOSIS — Z9981 Dependence on supplemental oxygen: Secondary | ICD-10-CM | POA: Diagnosis not present

## 2016-10-20 DIAGNOSIS — Z5181 Encounter for therapeutic drug level monitoring: Secondary | ICD-10-CM

## 2016-10-20 DIAGNOSIS — I10 Essential (primary) hypertension: Secondary | ICD-10-CM | POA: Diagnosis not present

## 2016-10-20 DIAGNOSIS — J449 Chronic obstructive pulmonary disease, unspecified: Secondary | ICD-10-CM | POA: Diagnosis not present

## 2016-10-20 DIAGNOSIS — N183 Chronic kidney disease, stage 3 (moderate): Secondary | ICD-10-CM | POA: Diagnosis not present

## 2016-10-20 DIAGNOSIS — Z48815 Encounter for surgical aftercare following surgery on the digestive system: Secondary | ICD-10-CM | POA: Diagnosis not present

## 2016-10-20 DIAGNOSIS — I480 Paroxysmal atrial fibrillation: Secondary | ICD-10-CM | POA: Diagnosis not present

## 2016-10-20 LAB — POCT INR: INR: 2

## 2016-10-20 NOTE — Patient Instructions (Signed)
Pre visit review using our clinic review tool, if applicable. No additional management support is needed unless otherwise documented below in the visit note. 

## 2016-10-22 ENCOUNTER — Ambulatory Visit (INDEPENDENT_AMBULATORY_CARE_PROVIDER_SITE_OTHER): Payer: Medicare HMO | Admitting: Family

## 2016-10-22 DIAGNOSIS — Z48815 Encounter for surgical aftercare following surgery on the digestive system: Secondary | ICD-10-CM | POA: Diagnosis not present

## 2016-10-22 DIAGNOSIS — N183 Chronic kidney disease, stage 3 (moderate): Secondary | ICD-10-CM | POA: Diagnosis not present

## 2016-10-22 DIAGNOSIS — J449 Chronic obstructive pulmonary disease, unspecified: Secondary | ICD-10-CM | POA: Diagnosis not present

## 2016-10-22 DIAGNOSIS — I10 Essential (primary) hypertension: Secondary | ICD-10-CM | POA: Diagnosis not present

## 2016-10-22 DIAGNOSIS — I480 Paroxysmal atrial fibrillation: Secondary | ICD-10-CM | POA: Diagnosis not present

## 2016-10-22 DIAGNOSIS — E1122 Type 2 diabetes mellitus with diabetic chronic kidney disease: Secondary | ICD-10-CM | POA: Diagnosis not present

## 2016-10-22 DIAGNOSIS — Z86718 Personal history of other venous thrombosis and embolism: Secondary | ICD-10-CM | POA: Diagnosis not present

## 2016-10-22 DIAGNOSIS — J9601 Acute respiratory failure with hypoxia: Secondary | ICD-10-CM | POA: Diagnosis not present

## 2016-10-22 DIAGNOSIS — Z9981 Dependence on supplemental oxygen: Secondary | ICD-10-CM | POA: Diagnosis not present

## 2016-10-22 DIAGNOSIS — E785 Hyperlipidemia, unspecified: Secondary | ICD-10-CM | POA: Diagnosis not present

## 2016-10-25 DIAGNOSIS — Z9981 Dependence on supplemental oxygen: Secondary | ICD-10-CM | POA: Diagnosis not present

## 2016-10-25 DIAGNOSIS — I10 Essential (primary) hypertension: Secondary | ICD-10-CM | POA: Diagnosis not present

## 2016-10-25 DIAGNOSIS — I480 Paroxysmal atrial fibrillation: Secondary | ICD-10-CM | POA: Diagnosis not present

## 2016-10-25 DIAGNOSIS — J9601 Acute respiratory failure with hypoxia: Secondary | ICD-10-CM | POA: Diagnosis not present

## 2016-10-25 DIAGNOSIS — N183 Chronic kidney disease, stage 3 (moderate): Secondary | ICD-10-CM | POA: Diagnosis not present

## 2016-10-25 DIAGNOSIS — Z48815 Encounter for surgical aftercare following surgery on the digestive system: Secondary | ICD-10-CM | POA: Diagnosis not present

## 2016-10-25 DIAGNOSIS — E785 Hyperlipidemia, unspecified: Secondary | ICD-10-CM | POA: Diagnosis not present

## 2016-10-25 DIAGNOSIS — Z86718 Personal history of other venous thrombosis and embolism: Secondary | ICD-10-CM | POA: Diagnosis not present

## 2016-10-25 DIAGNOSIS — J449 Chronic obstructive pulmonary disease, unspecified: Secondary | ICD-10-CM | POA: Diagnosis not present

## 2016-10-25 DIAGNOSIS — E1122 Type 2 diabetes mellitus with diabetic chronic kidney disease: Secondary | ICD-10-CM | POA: Diagnosis not present

## 2016-11-03 DIAGNOSIS — Z86718 Personal history of other venous thrombosis and embolism: Secondary | ICD-10-CM | POA: Diagnosis not present

## 2016-11-03 DIAGNOSIS — I2782 Chronic pulmonary embolism: Secondary | ICD-10-CM | POA: Diagnosis not present

## 2016-11-03 DIAGNOSIS — J129 Viral pneumonia, unspecified: Secondary | ICD-10-CM | POA: Diagnosis not present

## 2016-11-03 DIAGNOSIS — I480 Paroxysmal atrial fibrillation: Secondary | ICD-10-CM | POA: Diagnosis not present

## 2016-11-03 DIAGNOSIS — J9601 Acute respiratory failure with hypoxia: Secondary | ICD-10-CM | POA: Diagnosis not present

## 2016-11-03 DIAGNOSIS — Z9981 Dependence on supplemental oxygen: Secondary | ICD-10-CM | POA: Diagnosis not present

## 2016-11-03 DIAGNOSIS — N183 Chronic kidney disease, stage 3 (moderate): Secondary | ICD-10-CM | POA: Diagnosis not present

## 2016-11-03 DIAGNOSIS — E1122 Type 2 diabetes mellitus with diabetic chronic kidney disease: Secondary | ICD-10-CM | POA: Diagnosis not present

## 2016-11-03 DIAGNOSIS — Z48815 Encounter for surgical aftercare following surgery on the digestive system: Secondary | ICD-10-CM | POA: Diagnosis not present

## 2016-11-03 DIAGNOSIS — J449 Chronic obstructive pulmonary disease, unspecified: Secondary | ICD-10-CM | POA: Diagnosis not present

## 2016-11-03 DIAGNOSIS — E785 Hyperlipidemia, unspecified: Secondary | ICD-10-CM | POA: Diagnosis not present

## 2016-11-03 DIAGNOSIS — I10 Essential (primary) hypertension: Secondary | ICD-10-CM | POA: Diagnosis not present

## 2016-11-04 DIAGNOSIS — Z48815 Encounter for surgical aftercare following surgery on the digestive system: Secondary | ICD-10-CM | POA: Diagnosis not present

## 2016-11-04 DIAGNOSIS — I10 Essential (primary) hypertension: Secondary | ICD-10-CM | POA: Diagnosis not present

## 2016-11-04 DIAGNOSIS — E785 Hyperlipidemia, unspecified: Secondary | ICD-10-CM | POA: Diagnosis not present

## 2016-11-04 DIAGNOSIS — I480 Paroxysmal atrial fibrillation: Secondary | ICD-10-CM | POA: Diagnosis not present

## 2016-11-04 DIAGNOSIS — J9601 Acute respiratory failure with hypoxia: Secondary | ICD-10-CM | POA: Diagnosis not present

## 2016-11-04 DIAGNOSIS — Z7901 Long term (current) use of anticoagulants: Secondary | ICD-10-CM | POA: Diagnosis not present

## 2016-11-04 DIAGNOSIS — Z9981 Dependence on supplemental oxygen: Secondary | ICD-10-CM | POA: Diagnosis not present

## 2016-11-04 DIAGNOSIS — Z86718 Personal history of other venous thrombosis and embolism: Secondary | ICD-10-CM | POA: Diagnosis not present

## 2016-11-04 DIAGNOSIS — J449 Chronic obstructive pulmonary disease, unspecified: Secondary | ICD-10-CM | POA: Diagnosis not present

## 2016-11-04 DIAGNOSIS — N183 Chronic kidney disease, stage 3 (moderate): Secondary | ICD-10-CM | POA: Diagnosis not present

## 2016-11-04 DIAGNOSIS — E1122 Type 2 diabetes mellitus with diabetic chronic kidney disease: Secondary | ICD-10-CM | POA: Diagnosis not present

## 2016-12-03 DIAGNOSIS — J449 Chronic obstructive pulmonary disease, unspecified: Secondary | ICD-10-CM | POA: Diagnosis not present

## 2016-12-03 DIAGNOSIS — J129 Viral pneumonia, unspecified: Secondary | ICD-10-CM | POA: Diagnosis not present

## 2016-12-03 DIAGNOSIS — I2782 Chronic pulmonary embolism: Secondary | ICD-10-CM | POA: Diagnosis not present

## 2016-12-20 NOTE — Addendum Note (Signed)
Addendum  created 12/20/16 1249 by Myrtie Soman, MD   Sign clinical note

## 2017-01-03 DIAGNOSIS — J129 Viral pneumonia, unspecified: Secondary | ICD-10-CM | POA: Diagnosis not present

## 2017-01-03 DIAGNOSIS — J449 Chronic obstructive pulmonary disease, unspecified: Secondary | ICD-10-CM | POA: Diagnosis not present

## 2017-01-03 DIAGNOSIS — I2782 Chronic pulmonary embolism: Secondary | ICD-10-CM | POA: Diagnosis not present

## 2017-01-07 ENCOUNTER — Ambulatory Visit (HOSPITAL_COMMUNITY)
Admission: EM | Admit: 2017-01-07 | Discharge: 2017-01-07 | Disposition: A | Discharge status: Home / Self Care | Payer: Medicare HMO | Attending: Family Medicine | Admitting: Family Medicine

## 2017-01-07 ENCOUNTER — Encounter (HOSPITAL_COMMUNITY): Payer: Self-pay | Admitting: *Deleted

## 2017-01-07 DIAGNOSIS — H6121 Impacted cerumen, right ear: Secondary | ICD-10-CM

## 2017-01-07 NOTE — ED Triage Notes (Signed)
Pt  Reports    r   Earache  For   Several days       Pt  Reports   Decrease   In  Hearing   She  Denies   Any      Other   Symptoms

## 2017-01-07 NOTE — ED Provider Notes (Signed)
[place]    CSN: [id number] Arrival date & time: 01/07/17  1057     History   Chief Complaint Chief Complaint  Patient presents with  . Otalgia    HPI Alejandra [name] is a 83 y.o. female.   This is an 83 year old woman complaining of right ear pain and decreased hearing over the last 48 hours.  She has past medical history of atrial fibrillation, chronic anticoagulation, hypertension, diabetes      Past Medical History:  Diagnosis Date  . AAA (abdominal aortic aneurysm) ([place])   . COPD (chronic obstructive pulmonary disease) ([place])   . DM (diabetes mellitus) ([place])   . Dyslipidemia   . Hyperlipidemia   . Hypertension   . Left foot infection 2013  . 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 2013  . Pulmonary embolism [facility])     Patient Active Problem List   Diagnosis Date Noted  . Atrial fibrillation with rapid ventricular response ([place])   . Cholecystitis 10/09/2016  . Anticoagulated on Coumadin 10/09/2016  . HTN (hypertension), malignant 10/09/2016  . Chronic respiratory failure with hypoxia ([place]) 10/09/2016  . Neck pain 12/11/2015  . CKD stage 3 secondary to diabetes ([place]) 07/08/2015  . Routine general medical examination at a health care facility 07/07/2015  . Knee pain 12/10/2014  . Swelling of limb-Right  Leg 11/30/2013  . Aftercare following surgery of the circulatory system, [place] 11/30/2013  . Encounter for therapeutic drug monitoring 08/24/2013  . Abdominal aneurysm without mention of rupture 11/29/2011  . Abdominal pain, other specified site 10/29/2011  . HTN (hypertension) 10/29/2011  . Dyslipidemia 10/29/2011  . UNSPECIFIED VITAMIN D DEFICIENCY 09/21/2010  . PE 02/13/2010  . PNEUMONIA, ORGANISM UNSPECIFIED 02/13/2010  . COPD (chronic obstructive pulmonary disease) ([place]) 02/13/2010  . PULMONARY NODULE 02/13/2010    Past Surgical History:  Procedure Laterality Date  . ABDOMINAL AORTIC ANEURYSM REPAIR  10-30-2011  . CHOLECYSTECTOMY N/A 10/14/2016   Procedure: LAPAROSCOPIC CHOLECYSTECTOMY;  Surgeon: Clovis Riley, MD;  Location: [facility];  Service: General;  Laterality: N/A;  . ENDOVASCULAR STENT INSERTION  10/30/2011   Procedure: ENDOVASCULAR STENT GRAFT INSERTION;  Surgeon: Serafina [place], MD;  Location: [facility] OR;  Service: Vascular;  Laterality: N/A;  . VESICOVAGINAL FISTULA CLOSURE W/ TAH      OB History    No data available       Home Medications    Prior to Admission medications   Medication Sig Start Date End Date Taking? Authorizing Provider  albuterol (PROVENTIL) (2.5 MG/3ML) 0.083% nebulizer solution Take 2.5 mg by nebulization every 6 (six) hours as needed. For shortness of breath 05/07/11   Brand Males, MD  Alcohol Swabs (ALCOHOL PREP) 70 % PADS  03/29/12   [provider]  aspirin 81 MG tablet Take 81 mg by mouth daily.      [provider]  Cholecalciferol (VITAMIN D3) 1000 UNITS CAPS Take 1 capsule by mouth daily.      [provider]  EASY COMFORT INSULIN SYRINGE 30G X 1/2" 0.5 ML MISC  03/29/12   [provider]  enoxaparin (LOVENOX) 100 MG/ML injection Inject 1 mL (100 mg total) into the skin daily. 10/17/16 10/22/16  Johnson, Clanford L, MD  glipiZIDE (GLUCOTROL) 5 MG tablet Take 5 mg by mouth daily.     [provider]  glucose blood test strip  05/24/12   [provider]  HYDROcodone-acetaminophen (NORCO) 5-325 MG per tablet Take 1 tablet by mouth 4 (four) times daily as needed.  For pain     [provider]  Lancet Devices (LANCING DEVICE) [place]  03/29/12   [provider]  Lancets Glory Rosebush ULTRASOFT) lancets Use as directed    [provider]  lovastatin (MEVACOR) 20 MG tablet Take 20 mg by mouth at bedtime.      [provider]  metFORMIN (GLUCOPHAGE) 500 MG tablet Take 500 mg by mouth 2 (two) times daily with a meal.     [provider]  metoprolol (LOPRESSOR) 50 MG tablet Take 1 tablet by mouth 2 (two) times daily.   10/22/10   [provider]  Multiple Vitamin (MULTIVITAMIN) tablet Take 1 tablet by mouth daily.      [provider]  omega-3 acid ethyl esters (LOVAZA) 1 G capsule Take 2 g by mouth 2 (two) times daily.    [provider]  ONE TOUCH ULTRA TEST test strip Use as directed    [provider]  pantoprazole (PROTONIX) 40 MG tablet Take 40 mg by mouth daily.  05/26/12   [provider]  tiotropium (SPIRIVA) 18 MCG inhalation capsule Place 1 capsule (18 mcg total) into inhaler and inhale daily. 09/18/15   [place] Circle, FNP  triamterene-hydrochlorothiazide (MAXZIDE-25) 37.5-25 MG per tablet Take 1 tablet by mouth daily.      [provider]  warfarin (COUMADIN) 5 MG tablet Take 5 mg by mouth as directed. 5mg  daily except 7.5mg  on Mondays    [provider]    Family History Family History  Problem Relation Age of Onset  . Heart disease Mother   . Hypertension Mother   . Heart attack Mother   . Hypertension Father   . Diabetes Father   . Heart attack Father   . Heart disease Sister        x 3  . Hypertension Sister   . Heart attack Sister   . Clotting disorder Daughter   . Clotting disorder Son        multiple sons  . Diabetes Son   . Hypertension Brother     Social History Social History  Substance Use Topics  . Smoking status: Former Smoker    Packs/day: 0.50    Years: 54.00    Types: Cigarettes    Quit date: 01/23/2010  . Smokeless tobacco: Never Used  . Alcohol use No     Allergies   Penicillins   Review of Systems Review of Systems  HENT: Positive for ear pain and hearing loss.   All other systems reviewed and are negative.    Physical Exam Triage Vital Signs ED Triage Vitals  Enc Vitals Group     BP      Pulse      Resp      Temp      Temp src      SpO2      Weight      Height      Head Circumference      Peak Flow      Pain Score      Pain Loc      Pain Edu?      Excl. in [place]?    No  data found.   Updated Vital Signs BP 130/82 (BP Location: Right Arm)   Pulse 78   Temp 98.6 F (37 C) (Oral)   Resp 18   SpO2 100%      Physical Exam  Constitutional: She appears well-developed and well-nourished.  HENT:  Cerumen  impaction on the right  Pulmonary/Chest: Effort normal.  Neurological: She is alert.  Skin: Skin is warm and dry.  Nursing note and vitals reviewed.    UC Treatments / Results  Labs (all labs ordered are listed, but only abnormal results are displayed) Labs Reviewed - No data to display  EKG  EKG Interpretation None       Radiology No results found.  Procedures Procedures (including critical care time)  Medications Ordered in UC Medications - No data to display   Initial Impression / Assessment and Plan / UC Course  I have reviewed the triage vital signs and the nursing notes.  Pertinent labs & imaging results that were available during my care of the patient were reviewed by me and considered in my medical decision making (see chart for details).      Final Clinical Impressions(s) / UC Diagnoses   Final diagnoses:  Impacted cerumen of right ear    New Prescriptions New Prescriptions   No medications on file     Robyn Haber, MD 01/07/17 1147

## 2017-01-17 ENCOUNTER — Ambulatory Visit: Payer: Medicare HMO | Admitting: Family

## 2017-01-17 ENCOUNTER — Other Ambulatory Visit (HOSPITAL_COMMUNITY): Payer: Medicare HMO

## 2017-02-02 ENCOUNTER — Emergency Department (HOSPITAL_COMMUNITY): Payer: Medicare HMO

## 2017-02-02 ENCOUNTER — Encounter (HOSPITAL_COMMUNITY): Payer: Self-pay

## 2017-02-02 ENCOUNTER — Inpatient Hospital Stay (HOSPITAL_COMMUNITY)
Admission: EM | Admit: 2017-02-02 | Discharge: 2017-02-05 | DRG: 242 | Disposition: A | Discharge status: Home Health | Payer: Medicare HMO | Attending: Family Medicine | Admitting: Family Medicine

## 2017-02-02 DIAGNOSIS — E785 Hyperlipidemia, unspecified: Secondary | ICD-10-CM | POA: Diagnosis present

## 2017-02-02 DIAGNOSIS — I482 Chronic atrial fibrillation, unspecified: Secondary | ICD-10-CM | POA: Diagnosis present

## 2017-02-02 DIAGNOSIS — N183 Chronic kidney disease, stage 3 (moderate): Secondary | ICD-10-CM | POA: Diagnosis present

## 2017-02-02 DIAGNOSIS — J432 Centrilobular emphysema: Secondary | ICD-10-CM | POA: Diagnosis not present

## 2017-02-02 DIAGNOSIS — S2222XA Fracture of body of sternum, initial encounter for closed fracture: Secondary | ICD-10-CM | POA: Diagnosis not present

## 2017-02-02 DIAGNOSIS — Z79899 Other long term (current) drug therapy: Secondary | ICD-10-CM | POA: Diagnosis not present

## 2017-02-02 DIAGNOSIS — S80211A Abrasion, right knee, initial encounter: Secondary | ICD-10-CM | POA: Diagnosis not present

## 2017-02-02 DIAGNOSIS — J449 Chronic obstructive pulmonary disease, unspecified: Secondary | ICD-10-CM | POA: Diagnosis not present

## 2017-02-02 DIAGNOSIS — Z7951 Long term (current) use of inhaled steroids: Secondary | ICD-10-CM | POA: Diagnosis not present

## 2017-02-02 DIAGNOSIS — S2220XA Unspecified fracture of sternum, initial encounter for closed fracture: Secondary | ICD-10-CM | POA: Diagnosis present

## 2017-02-02 DIAGNOSIS — Z9049 Acquired absence of other specified parts of digestive tract: Secondary | ICD-10-CM

## 2017-02-02 DIAGNOSIS — Z950 Presence of cardiac pacemaker: Secondary | ICD-10-CM | POA: Diagnosis not present

## 2017-02-02 DIAGNOSIS — M25561 Pain in right knee: Secondary | ICD-10-CM | POA: Diagnosis not present

## 2017-02-02 DIAGNOSIS — Z7901 Long term (current) use of anticoagulants: Secondary | ICD-10-CM

## 2017-02-02 DIAGNOSIS — Z86711 Personal history of pulmonary embolism: Secondary | ICD-10-CM

## 2017-02-02 DIAGNOSIS — I2510 Atherosclerotic heart disease of native coronary artery without angina pectoris: Secondary | ICD-10-CM | POA: Diagnosis present

## 2017-02-02 DIAGNOSIS — Z8249 Family history of ischemic heart disease and other diseases of the circulatory system: Secondary | ICD-10-CM

## 2017-02-02 DIAGNOSIS — E1122 Type 2 diabetes mellitus with diabetic chronic kidney disease: Secondary | ICD-10-CM | POA: Diagnosis present

## 2017-02-02 DIAGNOSIS — R55 Syncope and collapse: Secondary | ICD-10-CM | POA: Diagnosis present

## 2017-02-02 DIAGNOSIS — J129 Viral pneumonia, unspecified: Secondary | ICD-10-CM | POA: Diagnosis not present

## 2017-02-02 DIAGNOSIS — R001 Bradycardia, unspecified: Secondary | ICD-10-CM | POA: Diagnosis present

## 2017-02-02 DIAGNOSIS — I129 Hypertensive chronic kidney disease with stage 1 through stage 4 chronic kidney disease, or unspecified chronic kidney disease: Secondary | ICD-10-CM | POA: Diagnosis present

## 2017-02-02 DIAGNOSIS — I152 Hypertension secondary to endocrine disorders: Secondary | ICD-10-CM | POA: Diagnosis present

## 2017-02-02 DIAGNOSIS — Z833 Family history of diabetes mellitus: Secondary | ICD-10-CM

## 2017-02-02 DIAGNOSIS — Y92410 Unspecified street and highway as the place of occurrence of the external cause: Secondary | ICD-10-CM

## 2017-02-02 DIAGNOSIS — Z9981 Dependence on supplemental oxygen: Secondary | ICD-10-CM | POA: Diagnosis not present

## 2017-02-02 DIAGNOSIS — I10 Essential (primary) hypertension: Secondary | ICD-10-CM | POA: Diagnosis not present

## 2017-02-02 DIAGNOSIS — S3991XA Unspecified injury of abdomen, initial encounter: Secondary | ICD-10-CM | POA: Diagnosis not present

## 2017-02-02 DIAGNOSIS — Z7984 Long term (current) use of oral hypoglycemic drugs: Secondary | ICD-10-CM

## 2017-02-02 DIAGNOSIS — E43 Unspecified severe protein-calorie malnutrition: Secondary | ICD-10-CM | POA: Diagnosis present

## 2017-02-02 DIAGNOSIS — E860 Dehydration: Secondary | ICD-10-CM | POA: Diagnosis present

## 2017-02-02 DIAGNOSIS — R079 Chest pain, unspecified: Secondary | ICD-10-CM | POA: Diagnosis not present

## 2017-02-02 DIAGNOSIS — J9611 Chronic respiratory failure with hypoxia: Secondary | ICD-10-CM | POA: Diagnosis present

## 2017-02-02 DIAGNOSIS — I2782 Chronic pulmonary embolism: Secondary | ICD-10-CM | POA: Diagnosis not present

## 2017-02-02 DIAGNOSIS — E1159 Type 2 diabetes mellitus with other circulatory complications: Secondary | ICD-10-CM | POA: Diagnosis present

## 2017-02-02 DIAGNOSIS — Z90710 Acquired absence of both cervix and uterus: Secondary | ICD-10-CM

## 2017-02-02 DIAGNOSIS — Z7982 Long term (current) use of aspirin: Secondary | ICD-10-CM | POA: Diagnosis not present

## 2017-02-02 DIAGNOSIS — Z6823 Body mass index (BMI) 23.0-23.9, adult: Secondary | ICD-10-CM

## 2017-02-02 DIAGNOSIS — I459 Conduction disorder, unspecified: Principal | ICD-10-CM | POA: Diagnosis present

## 2017-02-02 DIAGNOSIS — S199XXA Unspecified injury of neck, initial encounter: Secondary | ICD-10-CM | POA: Diagnosis not present

## 2017-02-02 DIAGNOSIS — Z87891 Personal history of nicotine dependence: Secondary | ICD-10-CM | POA: Diagnosis not present

## 2017-02-02 DIAGNOSIS — S299XXA Unspecified injury of thorax, initial encounter: Secondary | ICD-10-CM | POA: Diagnosis not present

## 2017-02-02 DIAGNOSIS — S8991XA Unspecified injury of right lower leg, initial encounter: Secondary | ICD-10-CM | POA: Diagnosis not present

## 2017-02-02 DIAGNOSIS — I495 Sick sinus syndrome: Secondary | ICD-10-CM | POA: Diagnosis not present

## 2017-02-02 DIAGNOSIS — S279XXA Injury of unspecified intrathoracic organ, initial encounter: Secondary | ICD-10-CM | POA: Diagnosis not present

## 2017-02-02 DIAGNOSIS — Z95818 Presence of other cardiac implants and grafts: Secondary | ICD-10-CM

## 2017-02-02 LAB — I-STAT CHEM 8, ED
BUN: 28 mg/dL — AB (ref 6–20)
CHLORIDE: 101 mmol/L (ref 101–111)
Calcium, Ion: 1.12 mmol/L — ABNORMAL LOW (ref 1.15–1.40)
Creatinine, Ser: 1.2 mg/dL — ABNORMAL HIGH (ref 0.44–1.00)
GLUCOSE: 90 mg/dL (ref 65–99)
HCT: 42 % (ref 36.0–46.0)
Hemoglobin: 14.3 g/dL (ref 12.0–15.0)
POTASSIUM: 4.1 mmol/L (ref 3.5–5.1)
Sodium: 139 mmol/L (ref 135–145)
TCO2: 28 mmol/L (ref 0–100)

## 2017-02-02 LAB — CBG MONITORING, ED: Glucose-Capillary: 131 mg/dL — ABNORMAL HIGH (ref 65–99)

## 2017-02-02 LAB — PROTIME-INR
INR: 1.58
PROTHROMBIN TIME: 19 s — AB (ref 11.4–15.2)

## 2017-02-02 LAB — CBC WITH DIFFERENTIAL/PLATELET
BASOS ABS: 0 10*3/uL (ref 0.0–0.1)
Basophils Relative: 0 %
EOS PCT: 2 %
Eosinophils Absolute: 0.2 10*3/uL (ref 0.0–0.7)
HCT: 38.5 % (ref 36.0–46.0)
HEMOGLOBIN: 12.9 g/dL (ref 12.0–15.0)
Lymphocytes Relative: 7 %
Lymphs Abs: 0.6 10*3/uL — ABNORMAL LOW (ref 0.7–4.0)
MCH: 26.3 pg (ref 26.0–34.0)
MCHC: 33.5 g/dL (ref 30.0–36.0)
MCV: 78.6 fL (ref 78.0–100.0)
Monocytes Absolute: 0.4 10*3/uL (ref 0.1–1.0)
Monocytes Relative: 4 %
NEUTROS ABS: 7.9 10*3/uL — AB (ref 1.7–7.7)
NEUTROS PCT: 87 %
PLATELETS: 201 10*3/uL (ref 150–400)
RBC: 4.9 MIL/uL (ref 3.87–5.11)
RDW: 16 % — ABNORMAL HIGH (ref 11.5–15.5)
WBC: 9.1 10*3/uL (ref 4.0–10.5)

## 2017-02-02 LAB — I-STAT TROPONIN, ED: TROPONIN I, POC: 0 ng/mL (ref 0.00–0.08)

## 2017-02-02 MED ORDER — ACETAMINOPHEN 325 MG PO TABS
650.0000 mg | ORAL_TABLET | Freq: Four times a day (QID) | ORAL | Status: DC
  Administered 2017-02-03 – 2017-02-04 (×6): 650 mg via ORAL
  Filled 2017-02-02 (×7): qty 2

## 2017-02-02 MED ORDER — FENTANYL CITRATE (PF) 100 MCG/2ML IJ SOLN
50.0000 ug | Freq: Once | INTRAMUSCULAR | Status: AC
  Administered 2017-02-02: 50 ug via INTRAVENOUS
  Filled 2017-02-02: qty 2

## 2017-02-02 MED ORDER — HYDROCODONE-ACETAMINOPHEN 5-325 MG PO TABS
1.0000 | ORAL_TABLET | Freq: Four times a day (QID) | ORAL | Status: DC | PRN
  Administered 2017-02-02: 1 via ORAL
  Filled 2017-02-02: qty 1

## 2017-02-02 MED ORDER — WARFARIN SODIUM 10 MG PO TABS
10.0000 mg | ORAL_TABLET | ORAL | Status: AC
  Administered 2017-02-03: 10 mg via ORAL
  Filled 2017-02-02 (×2): qty 1

## 2017-02-02 MED ORDER — WARFARIN - PHARMACIST DOSING INPATIENT: Freq: Every day | Status: DC

## 2017-02-02 MED ORDER — OXYCODONE HCL 5 MG PO TABS
2.5000 mg | ORAL_TABLET | ORAL | Status: DC | PRN
  Administered 2017-02-03 – 2017-02-05 (×2): 5 mg via ORAL
  Filled 2017-02-02 (×2): qty 1

## 2017-02-02 MED ORDER — INSULIN ASPART 100 UNIT/ML ~~LOC~~ SOLN: 0.0000 [IU] | Freq: Every day | SUBCUTANEOUS | Status: DC

## 2017-02-02 MED ORDER — IOPAMIDOL (ISOVUE-300) INJECTION 61%
INTRAVENOUS | Status: AC
  Administered 2017-02-02: 75 mL
  Filled 2017-02-02: qty 75

## 2017-02-02 MED ORDER — FENTANYL CITRATE (PF) 100 MCG/2ML IJ SOLN
50.0000 ug | Freq: Once | INTRAMUSCULAR | Status: AC
  Administered 2017-02-02: 22:00:00 via INTRAVENOUS
  Filled 2017-02-02: qty 2

## 2017-02-02 MED ORDER — TRAMADOL HCL 50 MG PO TABS
50.0000 mg | ORAL_TABLET | Freq: Four times a day (QID) | ORAL | Status: DC
  Administered 2017-02-03 – 2017-02-05 (×8): 50 mg via ORAL
  Filled 2017-02-02 (×9): qty 1

## 2017-02-02 NOTE — ED Notes (Signed)
Dr. Hulen Skains at bedside evaluating pt.
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
## 2017-02-02 NOTE — ED Notes (Signed)
Attempted to call report
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
## 2017-02-03 ENCOUNTER — Inpatient Hospital Stay (HOSPITAL_COMMUNITY): Payer: Medicare HMO

## 2017-02-03 DIAGNOSIS — E1122 Type 2 diabetes mellitus with diabetic chronic kidney disease: Secondary | ICD-10-CM

## 2017-02-03 DIAGNOSIS — J432 Centrilobular emphysema: Secondary | ICD-10-CM

## 2017-02-03 DIAGNOSIS — R55 Syncope and collapse: Secondary | ICD-10-CM

## 2017-02-03 DIAGNOSIS — I482 Chronic atrial fibrillation: Secondary | ICD-10-CM

## 2017-02-03 DIAGNOSIS — N183 Chronic kidney disease, stage 3 (moderate): Secondary | ICD-10-CM

## 2017-02-03 DIAGNOSIS — J9611 Chronic respiratory failure with hypoxia: Secondary | ICD-10-CM

## 2017-02-03 LAB — GLUCOSE, CAPILLARY
GLUCOSE-CAPILLARY: 146 mg/dL — AB (ref 65–99)
GLUCOSE-CAPILLARY: 125 mg/dL — AB (ref 65–99)
GLUCOSE-CAPILLARY: 158 mg/dL — AB (ref 65–99)
Glucose-Capillary: 175 mg/dL — ABNORMAL HIGH (ref 65–99)
Glucose-Capillary: 120 mg/dL — ABNORMAL HIGH (ref 65–99)

## 2017-02-03 LAB — VAS US CAROTID
LCCADDIAS: 12 cm/s
LCCADSYS: 45 cm/s
LCCAPDIAS: -8 cm/s
LEFT ECA DIAS: -7 cm/s
LEFT VERTEBRAL DIAS: -7 cm/s
LICADSYS: -58 cm/s
Left CCA prox sys: -60 cm/s
Left ICA dist dias: -19 cm/s
Left ICA prox dias: 15 cm/s
Left ICA prox sys: 47 cm/s
RCCADSYS: -43 cm/s
RCCAPSYS: 40 cm/s
RIGHT ECA DIAS: -12 cm/s
RIGHT VERTEBRAL DIAS: -10 cm/s
Right CCA prox dias: 8 cm/s

## 2017-02-03 LAB — BASIC METABOLIC PANEL
Anion gap: 12 (ref 5–15)
BUN: 21 mg/dL — AB (ref 6–20)
CHLORIDE: 99 mmol/L — AB (ref 101–111)
CO2: 25 mmol/L (ref 22–32)
CREATININE: 1.1 mg/dL — AB (ref 0.44–1.00)
Calcium: 8.9 mg/dL (ref 8.9–10.3)
GFR calc Af Amer: 53 mL/min — ABNORMAL LOW (ref >60)
GFR calc non Af Amer: 45 mL/min — ABNORMAL LOW (ref >60)
Glucose, Bld: 183 mg/dL — ABNORMAL HIGH (ref 65–99)
Potassium: 3.8 mmol/L (ref 3.5–5.1)
Sodium: 136 mmol/L (ref 135–145)

## 2017-02-03 LAB — CBC
HCT: 35.9 % — ABNORMAL LOW (ref 36.0–46.0)
Hemoglobin: 12.3 g/dL (ref 12.0–15.0)
MCH: 26.6 pg (ref 26.0–34.0)
MCHC: 34.3 g/dL (ref 30.0–36.0)
MCV: 77.7 fL — AB (ref 78.0–100.0)
PLATELETS: 189 10*3/uL (ref 150–400)
RBC: 4.62 MIL/uL (ref 3.87–5.11)
RDW: 15.9 % — AB (ref 11.5–15.5)
WBC: 7.2 10*3/uL (ref 4.0–10.5)

## 2017-02-03 LAB — PROTIME-INR
INR: 1.78
Prothrombin Time: 20.9 seconds — ABNORMAL HIGH (ref 11.4–15.2)

## 2017-02-03 LAB — MAGNESIUM: Magnesium: 1.4 mg/dL — ABNORMAL LOW (ref 1.7–2.4)

## 2017-02-03 MED ORDER — INSULIN ASPART 100 UNIT/ML ~~LOC~~ SOLN
0.0000 [IU] | Freq: Three times a day (TID) | SUBCUTANEOUS | Status: DC
Start: 2017-02-03 — End: 2017-02-05
  Administered 2017-02-03 (×2): 2 [IU] via SUBCUTANEOUS
  Administered 2017-02-03: 3 [IU] via SUBCUTANEOUS
  Administered 2017-02-05: 2 [IU] via SUBCUTANEOUS
  Administered 2017-02-05: 3 [IU] via SUBCUTANEOUS

## 2017-02-03 MED ORDER — ONDANSETRON HCL 4 MG/2ML IJ SOLN
4.0000 mg | Freq: Four times a day (QID) | INTRAMUSCULAR | Status: DC | PRN
  Administered 2017-02-03: 4 mg via INTRAVENOUS
  Filled 2017-02-03: qty 2

## 2017-02-03 MED ORDER — WARFARIN SODIUM 5 MG PO TABS
5.0000 mg | ORAL_TABLET | Freq: Once | ORAL | Status: AC
  Administered 2017-02-03: 5 mg via ORAL
  Filled 2017-02-03: qty 1

## 2017-02-03 MED ORDER — ASPIRIN EC 81 MG PO TBEC
81.0000 mg | DELAYED_RELEASE_TABLET | Freq: Every day | ORAL | Status: DC
  Administered 2017-02-03 – 2017-02-05 (×2): 81 mg via ORAL
  Filled 2017-02-03 (×3): qty 1

## 2017-02-03 MED ORDER — TRIAMTERENE-HCTZ 37.5-25 MG PO TABS
1.0000 | ORAL_TABLET | Freq: Every day | ORAL | Status: DC
  Filled 2017-02-03: qty 1

## 2017-02-03 MED ORDER — MAGNESIUM SULFATE 50 % IJ SOLN
3.0000 g | Freq: Once | INTRAVENOUS | Status: AC
  Administered 2017-02-03: 3 g via INTRAVENOUS
  Filled 2017-02-03: qty 6

## 2017-02-03 MED ORDER — PANTOPRAZOLE SODIUM 40 MG PO TBEC
40.0000 mg | DELAYED_RELEASE_TABLET | Freq: Every day | ORAL | Status: DC
  Administered 2017-02-03 – 2017-02-05 (×2): 40 mg via ORAL
  Filled 2017-02-03 (×3): qty 1

## 2017-02-03 MED ORDER — METOPROLOL TARTRATE 50 MG PO TABS
50.0000 mg | ORAL_TABLET | Freq: Two times a day (BID) | ORAL | Status: DC
  Administered 2017-02-03: 50 mg via ORAL
  Filled 2017-02-03: qty 1

## 2017-02-03 MED ORDER — SODIUM CHLORIDE 0.9% FLUSH
3.0000 mL | Freq: Two times a day (BID) | INTRAVENOUS | Status: DC
  Administered 2017-02-03 – 2017-02-05 (×3): 3 mL via INTRAVENOUS

## 2017-02-03 MED ORDER — ACETAMINOPHEN 325 MG PO TABS: 650.0000 mg | ORAL_TABLET | Freq: Four times a day (QID) | ORAL | Status: DC | PRN

## 2017-02-03 MED ORDER — ACETAMINOPHEN 650 MG RE SUPP: 650.0000 mg | Freq: Four times a day (QID) | RECTAL | Status: DC | PRN

## 2017-02-03 MED ORDER — PRAVASTATIN SODIUM 20 MG PO TABS
20.0000 mg | ORAL_TABLET | Freq: Every day | ORAL | Status: DC
  Administered 2017-02-03 – 2017-02-04 (×2): 20 mg via ORAL
  Filled 2017-02-03 (×2): qty 1

## 2017-02-03 MED ORDER — TIOTROPIUM BROMIDE MONOHYDRATE 18 MCG IN CAPS
18.0000 ug | ORAL_CAPSULE | Freq: Every day | RESPIRATORY_TRACT | Status: DC
  Administered 2017-02-03 – 2017-02-05 (×3): 18 ug via RESPIRATORY_TRACT
  Filled 2017-02-03: qty 5

## 2017-02-03 MED ORDER — SODIUM CHLORIDE 0.9 % IV SOLN
INTRAVENOUS | Status: AC
  Administered 2017-02-03 (×2): via INTRAVENOUS

## 2017-02-03 MED ORDER — GLUCERNA SHAKE PO LIQD
237.0000 mL | Freq: Three times a day (TID) | ORAL | Status: DC
  Administered 2017-02-03 – 2017-02-05 (×3): 237 mL via ORAL

## 2017-02-03 MED ORDER — LOSARTAN POTASSIUM 25 MG PO TABS
25.0000 mg | ORAL_TABLET | Freq: Every day | ORAL | Status: DC
  Administered 2017-02-03 – 2017-02-05 (×2): 25 mg via ORAL
  Filled 2017-02-03 (×3): qty 1

## 2017-02-03 NOTE — Progress Notes (Signed)
**  Preliminary report by tech**  Carotid artery duplex complete. Findings are consistent with a 1-39 percent stenosis involving the right internal carotid artery and the left internal carotid artery. The vertebral arteries demonstrate antegrade flow.  02/03/17 5:21 PM Alejandra [name] RVT
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
## 2017-02-03 NOTE — Progress Notes (Signed)
Unable to do orthostatic vital signs, pt c/o sternal pain with movement and breathing. Dr. Loleta Books made aware.

## 2017-02-03 NOTE — Discharge Instructions (Addendum)
Supplemental Discharge Instructions for  Pacemaker/Defibrillator Patients  Activity No heavy lifting or vigorous activity with your left/right arm for 6 to 8 weeks.  Do not raise your left/right arm above your head for one week.  Gradually raise your affected arm as drawn below.             02/08/17                     02/09/17                    02/10/17                   02/11/17 __  NO DRIVING until cleared at your follow up visit  Garey the wound area clean and dry.  Do not get this area wet for one week. No showers for one week; you may shower on 02/11/17 . - The tape/steri-strips on your wound will fall off; do not pull them off.  No bandage is needed on the site.  DO  NOT apply any creams, oils, or ointments to the wound area. - If you notice any drainage or discharge from the wound, any swelling or bruising at the site, or you develop a fever > 101? F after you are discharged home, call the office at once.  Special Instructions - You are still able to use cellular telephones; use the ear opposite the side where you have your pacemaker/defibrillator.  Avoid carrying your cellular phone near your device. - When traveling through airports, show security personnel your identification card to avoid being screened in the metal detectors.  Ask the security personnel to use the hand wand. - Avoid arc welding equipment, MRI testing (magnetic resonance imaging), TENS units (transcutaneous nerve stimulators).  Call the office for questions about other devices. - Avoid electrical appliances that are in poor condition or are not properly grounded. - Microwave ovens are safe to be near or to operate.  Additional information for defibrillator patients should your device go off: - If your device goes off ONCE and you feel fine afterward, notify the device clinic nurses. - If your device goes off ONCE and you do not feel well afterward, call 911. - If your device goes off TWICE, call  911. - If your device goes off THREE times in one day, call 911.  DO NOT DRIVE YOURSELF OR A FAMILY MEMBER WITH A DEFIBRILLATOR TO THE HOSPITAL--CALL 911.   Information on my medicine - Coumadin   (Warfarin)  This medication education was reviewed with me or my healthcare representative as part of my discharge preparation.  The pharmacist that spoke with me during my hospital stay was:  Saundra Shelling, [facility]  Why was Coumadin prescribed for you? Coumadin was prescribed for you because you have a blood clot or a medical condition that can cause an increased risk of forming blood clots. Blood clots can cause serious health problems by blocking the flow of blood to the heart, lung, or brain. Coumadin can prevent harmful blood clots from forming. As a reminder your indication for Coumadin is:   Stroke Prevention Because Of Atrial Fibrillation  And history of PE  What test will check on my response to Coumadin? While on Coumadin (warfarin) you will need to have an INR test regularly to ensure that your dose is keeping you in the desired range. The INR (international normalized ratio) number is calculated from the  result of the laboratory test called prothrombin time (PT).  If an INR APPOINTMENT HAS NOT ALREADY BEEN MADE FOR YOU please schedule an appointment to have this lab work done by your health care provider within 7 days. Your INR goal is usually a number between:  2 to 3 or your provider may give you a more narrow range like 2-2.5.  Ask your health care provider during an office visit what your goal INR is.  What  do you need to  know  About  COUMADIN? Take Coumadin (warfarin) exactly as prescribed by your healthcare provider about the same time each day.  DO NOT stop taking without talking to the doctor who prescribed the medication.  Stopping without other blood clot prevention medication to take the place of Coumadin may increase your risk of developing a new clot or stroke.  Get refills  before you run out.  What do you do if you miss a dose? If you miss a dose, take it as soon as you remember on the same day then continue your regularly scheduled regimen the next day.  Do not take two doses of Coumadin at the same time.  Important Safety Information A possible side effect of Coumadin (Warfarin) is an increased risk of bleeding. You should call your healthcare provider right away if you experience any of the following: ? Bleeding from an injury or your nose that does not stop. ? Unusual colored urine (red or dark brown) or unusual colored stools (red or black). ? Unusual bruising for unknown reasons. ? A serious fall or if you hit your head (even if there is no bleeding).  Some foods or medicines interact with Coumadin (warfarin) and might alter your response to warfarin. To help avoid this: ? Eat a balanced diet, maintaining a consistent amount of Vitamin K. ? Notify your provider about major diet changes you plan to make. ? Avoid alcohol or limit your intake to 1 drink for women and 2 drinks for men per day. (1 drink is 5 oz. wine, 12 oz. beer, or 1.5 oz. liquor.)  Make sure that ANY health care provider who prescribes medication for you knows that you are taking Coumadin (warfarin).  Also make sure the healthcare provider who is monitoring your Coumadin knows when you have started a new medication including herbals and non-prescription products.  Coumadin (Warfarin)  Major Drug Interactions  Increased Warfarin Effect Decreased Warfarin Effect  Alcohol (large quantities) Antibiotics (esp. Septra/Bactrim, Flagyl, Cipro) Amiodarone (Cordarone) Aspirin (ASA) Cimetidine (Tagamet) Megestrol (Megace) NSAIDs (ibuprofen, naproxen, etc.) Piroxicam (Feldene) Propafenone (Rythmol SR) Propranolol (Inderal) Isoniazid (INH) Posaconazole (Noxafil) Barbiturates (Phenobarbital) Carbamazepine (Tegretol) Chlordiazepoxide (Librium) Cholestyramine (Questran) Griseofulvin Oral  Contraceptives Rifampin Sucralfate (Carafate) Vitamin K   Coumadin (Warfarin) Major Herbal Interactions  Increased Warfarin Effect Decreased Warfarin Effect  Garlic Ginseng Ginkgo biloba Coenzyme Q10 Green tea St. Johns wort    Coumadin (Warfarin) FOOD Interactions  Eat a consistent number of servings per week of foods HIGH in Vitamin K (1 serving =  cup)  Collards (cooked, or boiled & drained) Kale (cooked, or boiled & drained) Mustard greens (cooked, or boiled & drained) Parsley *serving size only =  cup Spinach (cooked, or boiled & drained) Swiss chard (cooked, or boiled & drained) Turnip greens (cooked, or boiled & drained)  Eat a consistent number of servings per week of foods MEDIUM-HIGH in Vitamin K (1 serving = 1 cup)  Asparagus (cooked, or boiled & drained) Broccoli (cooked, boiled & drained, or raw &  chopped) Brussel sprouts (cooked, or boiled & drained) *serving size only =  cup Lettuce, raw (green leaf, endive, romaine) Spinach, raw Turnip greens, raw & chopped   These websites have more information on Coumadin (warfarin):  FailFactory.se; VeganReport.com.au;
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
## 2017-02-03 NOTE — Progress Notes (Signed)
Orthostatic blood pressure negative for any kind of dizziness, IV fluid continue, no any complain of pain this point, will continue to monitor
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
## 2017-02-04 ENCOUNTER — Encounter (HOSPITAL_COMMUNITY)
Admission: EM | Disposition: A | Discharge status: Home Health | Payer: Self-pay | Source: Home / Self Care | Attending: Family Medicine

## 2017-02-04 DIAGNOSIS — I10 Essential (primary) hypertension: Secondary | ICD-10-CM

## 2017-02-04 DIAGNOSIS — I495 Sick sinus syndrome: Secondary | ICD-10-CM

## 2017-02-04 HISTORY — PX: PACEMAKER IMPLANT: EP1218

## 2017-02-04 LAB — MAGNESIUM: Magnesium: 1.9 mg/dL (ref 1.7–2.4)

## 2017-02-04 LAB — BASIC METABOLIC PANEL
Anion gap: 10 (ref 5–15)
BUN: 14 mg/dL (ref 6–20)
CHLORIDE: 96 mmol/L — AB (ref 101–111)
CO2: 29 mmol/L (ref 22–32)
Calcium: 8.4 mg/dL — ABNORMAL LOW (ref 8.9–10.3)
Creatinine, Ser: 0.87 mg/dL (ref 0.44–1.00)
GFR calc non Af Amer: 60 mL/min — ABNORMAL LOW (ref >60)
Glucose, Bld: 154 mg/dL — ABNORMAL HIGH (ref 65–99)
POTASSIUM: 3.5 mmol/L (ref 3.5–5.1)
SODIUM: 135 mmol/L (ref 135–145)

## 2017-02-04 LAB — CBC
HEMATOCRIT: 35 % — AB (ref 36.0–46.0)
Hemoglobin: 11.9 g/dL — ABNORMAL LOW (ref 12.0–15.0)
MCH: 26.4 pg (ref 26.0–34.0)
MCHC: 34 g/dL (ref 30.0–36.0)
MCV: 77.8 fL — AB (ref 78.0–100.0)
Platelets: 190 10*3/uL (ref 150–400)
RBC: 4.5 MIL/uL (ref 3.87–5.11)
RDW: 15.8 % — ABNORMAL HIGH (ref 11.5–15.5)
WBC: 6.5 10*3/uL (ref 4.0–10.5)

## 2017-02-04 LAB — SURGICAL PCR SCREEN
MRSA, PCR: POSITIVE — AB
STAPHYLOCOCCUS AUREUS: POSITIVE — AB

## 2017-02-04 LAB — GLUCOSE, CAPILLARY
GLUCOSE-CAPILLARY: 131 mg/dL — AB (ref 65–99)
Glucose-Capillary: 152 mg/dL — ABNORMAL HIGH (ref 65–99)
Glucose-Capillary: 111 mg/dL — ABNORMAL HIGH (ref 65–99)
Glucose-Capillary: 180 mg/dL — ABNORMAL HIGH (ref 65–99)

## 2017-02-04 LAB — PROTIME-INR
INR: 2.02
PROTHROMBIN TIME: 23.2 s — AB (ref 11.4–15.2)

## 2017-02-04 SURGERY — PACEMAKER IMPLANT

## 2017-02-04 MED ORDER — SODIUM CHLORIDE 0.9% FLUSH: 3.0000 mL | INTRAVENOUS | Status: DC | PRN

## 2017-02-04 MED ORDER — ONDANSETRON HCL 4 MG/2ML IJ SOLN
4.0000 mg | Freq: Four times a day (QID) | INTRAMUSCULAR | Status: DC | PRN
  Administered 2017-02-04: 4 mg via INTRAVENOUS
  Filled 2017-02-04: qty 2

## 2017-02-04 MED ORDER — MIDAZOLAM HCL 5 MG/5ML IJ SOLN
INTRAMUSCULAR | Status: AC
  Filled 2017-02-04: qty 5

## 2017-02-04 MED ORDER — VANCOMYCIN HCL 1000 MG IV SOLR: INTRAVENOUS | Status: DC | PRN

## 2017-02-04 MED ORDER — METOPROLOL TARTRATE 50 MG PO TABS
50.0000 mg | ORAL_TABLET | Freq: Two times a day (BID) | ORAL | Status: DC
  Administered 2017-02-04: 50 mg via ORAL
  Filled 2017-02-04: qty 1

## 2017-02-04 MED ORDER — GENTAMICIN SULFATE 40 MG/ML IJ SOLN
INTRAMUSCULAR | Status: AC
  Filled 2017-02-04: qty 2

## 2017-02-04 MED ORDER — IOPAMIDOL (ISOVUE-370) INJECTION 76%
INTRAVENOUS | Status: DC | PRN
Start: 2017-02-04 — End: 2017-02-04
  Administered 2017-02-04: 15 mL via INTRAVENOUS

## 2017-02-04 MED ORDER — SODIUM CHLORIDE 0.9 % IV SOLN
INTRAVENOUS | Status: DC
  Administered 2017-02-04: 10:00:00 via INTRAVENOUS

## 2017-02-04 MED ORDER — LIDOCAINE HCL (PF) 1 % IJ SOLN
INTRAMUSCULAR | Status: DC | PRN
  Administered 2017-02-04: 45 mL via INTRADERMAL

## 2017-02-04 MED ORDER — CHLORHEXIDINE GLUCONATE 4 % EX LIQD
60.0000 mL | Freq: Once | CUTANEOUS | Status: DC
  Filled 2017-02-04: qty 60

## 2017-02-04 MED ORDER — HEPARIN (PORCINE) IN NACL 2-0.9 UNIT/ML-% IJ SOLN
INTRAMUSCULAR | Status: AC | PRN
  Administered 2017-02-04: 500 mL

## 2017-02-04 MED ORDER — WARFARIN SODIUM 5 MG PO TABS: 5.0000 mg | ORAL_TABLET | Freq: Once | ORAL | Status: DC

## 2017-02-04 MED ORDER — MUPIROCIN 2 % EX OINT
1.0000 "application " | TOPICAL_OINTMENT | Freq: Two times a day (BID) | CUTANEOUS | Status: DC
  Administered 2017-02-05 (×2): 1 via NASAL
  Filled 2017-02-04: qty 22

## 2017-02-04 MED ORDER — CHLORHEXIDINE GLUCONATE CLOTH 2 % EX PADS: 6.0000 | MEDICATED_PAD | Freq: Every day | CUTANEOUS | Status: DC

## 2017-02-04 MED ORDER — MIDAZOLAM HCL 5 MG/5ML IJ SOLN
INTRAMUSCULAR | Status: DC | PRN
Start: 2017-02-04 — End: 2017-02-04
  Administered 2017-02-04: 1 mg via INTRAVENOUS

## 2017-02-04 MED ORDER — FENTANYL CITRATE (PF) 100 MCG/2ML IJ SOLN
INTRAMUSCULAR | Status: DC | PRN
  Administered 2017-02-04: 25 ug via INTRAVENOUS

## 2017-02-04 MED ORDER — ACETAMINOPHEN 325 MG PO TABS: 325.0000 mg | ORAL_TABLET | ORAL | Status: DC | PRN

## 2017-02-04 MED ORDER — GENTAMICIN SULFATE 40 MG/ML IJ SOLN
80.0000 mg | INTRAMUSCULAR | Status: AC
  Administered 2017-02-04: 80 mg
  Filled 2017-02-04: qty 2

## 2017-02-04 MED ORDER — VANCOMYCIN HCL IN DEXTROSE 1-5 GM/200ML-% IV SOLN
INTRAVENOUS | Status: AC
  Filled 2017-02-04: qty 200

## 2017-02-04 MED ORDER — SODIUM CHLORIDE 0.9 % IV BOLUS (SEPSIS)
INTRAVENOUS | Status: AC | PRN
  Administered 2017-02-04: 250 mL via INTRAVENOUS

## 2017-02-04 MED ORDER — LIDOCAINE HCL (PF) 1 % IJ SOLN
INTRAMUSCULAR | Status: AC
  Filled 2017-02-04: qty 60

## 2017-02-04 MED ORDER — VANCOMYCIN HCL IN DEXTROSE 1-5 GM/200ML-% IV SOLN
1000.0000 mg | Freq: Two times a day (BID) | INTRAVENOUS | Status: AC
  Administered 2017-02-05: 1000 mg via INTRAVENOUS
  Filled 2017-02-04: qty 200

## 2017-02-04 MED ORDER — CHLORHEXIDINE GLUCONATE 4 % EX LIQD: 60.0000 mL | Freq: Once | CUTANEOUS | Status: DC

## 2017-02-04 MED ORDER — FENTANYL CITRATE (PF) 100 MCG/2ML IJ SOLN
INTRAMUSCULAR | Status: AC
  Filled 2017-02-04: qty 2

## 2017-02-04 MED ORDER — VANCOMYCIN HCL IN DEXTROSE 1-5 GM/200ML-% IV SOLN
1000.0000 mg | INTRAVENOUS | Status: AC
  Administered 2017-02-04: 1000 mg via INTRAVENOUS
  Filled 2017-02-04: qty 200

## 2017-02-04 MED ORDER — SODIUM CHLORIDE 0.9% FLUSH
3.0000 mL | Freq: Two times a day (BID) | INTRAVENOUS | Status: DC
  Administered 2017-02-04: 3 mL via INTRAVENOUS

## 2017-02-04 MED ORDER — SODIUM CHLORIDE 0.9 % IV SOLN: 250.0000 mL | INTRAVENOUS | Status: DC

## 2017-02-04 SURGICAL SUPPLY — 8 items
CABLE SURGICAL S-101-97-12 (CABLE) ×1 IMPLANT
KIT MICROINTRODUCER STIFF 5F (SHEATH) ×1 IMPLANT
LEAD TENDRIL MRI 46CM LPA1200M (Lead) ×1 IMPLANT
LEAD TENDRIL MRI 52CM LPA1200M (Lead) ×1 IMPLANT
PACEMAKER ASSURITY DR-RF (Pacemaker) ×1 IMPLANT
PAD DEFIB LIFELINK (PAD) ×1 IMPLANT
SHEATH CLASSIC 8F (SHEATH) ×2 IMPLANT
TRAY PACEMAKER INSERTION (PACKS) ×1 IMPLANT
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
## 2017-02-04 NOTE — Progress Notes (Signed)
OT Cancellation Note  Patient Details Name: Alejandra [name] MRN: [id number] DOB: [birth year]   Cancelled Treatment:    Reason Eval/Treat Not Completed: Pain limiting ability to participate.  Pt adamantly refusing, despite max efforts.  Pt informed of risks associated with immobility.    [employer] [place], OTR/L [phone]   Lucille Passy M 02/04/2017, 1:16 PM

## 2017-02-04 NOTE — Progress Notes (Signed)
Pt I alert and oriented with Acute pain refused po analgesic. Called md for Iv pain Medication, Wants patient to try PO Medication, Call Md back if in severe Pain.
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
## 2017-02-05 ENCOUNTER — Other Ambulatory Visit: Payer: Self-pay

## 2017-02-05 ENCOUNTER — Inpatient Hospital Stay (HOSPITAL_COMMUNITY): Payer: Medicare HMO

## 2017-02-05 DIAGNOSIS — I459 Conduction disorder, unspecified: Principal | ICD-10-CM

## 2017-02-05 LAB — PROTIME-INR
INR: 2.21
PROTHROMBIN TIME: 24.9 s — AB (ref 11.4–15.2)

## 2017-02-05 LAB — GLUCOSE, CAPILLARY
Glucose-Capillary: 153 mg/dL — ABNORMAL HIGH (ref 65–99)
Glucose-Capillary: 137 mg/dL — ABNORMAL HIGH (ref 65–99)

## 2017-02-05 MED ORDER — OXYCODONE HCL 5 MG PO TABS: 2.5000 mg | ORAL_TABLET | Freq: Four times a day (QID) | ORAL | 0 refills | Status: DC | PRN

## 2017-02-05 MED ORDER — DOCUSATE SODIUM 100 MG PO CAPS: 100.0000 mg | ORAL_CAPSULE | Freq: Two times a day (BID) | ORAL | 2 refills | Status: DC

## 2017-02-05 MED ORDER — TRIAMCINOLONE ACETONIDE 0.1 % EX CREA
TOPICAL_CREAM | Freq: Three times a day (TID) | CUTANEOUS | Status: DC | PRN
  Administered 2017-02-05: 13:00:00 via TOPICAL
  Filled 2017-02-05: qty 15

## 2017-02-05 MED ORDER — WARFARIN SODIUM 5 MG PO TABS: 5.0000 mg | ORAL_TABLET | Freq: Once | ORAL | Status: DC

## 2017-02-05 MED ORDER — GLUCERNA SHAKE PO LIQD: 237.0000 mL | Freq: Three times a day (TID) | ORAL | 0 refills | Status: AC

## 2017-02-05 MED ORDER — WARFARIN - PHARMACIST DOSING INPATIENT
Freq: Every day | Status: DC
Start: 2017-02-05 — End: 2017-02-05

## 2017-02-05 MED ORDER — METOPROLOL TARTRATE 100 MG PO TABS
100.0000 mg | ORAL_TABLET | Freq: Two times a day (BID) | ORAL | Status: DC
  Administered 2017-02-05: 100 mg via ORAL
  Filled 2017-02-05: qty 1

## 2017-02-05 MED ORDER — METOPROLOL TARTRATE 100 MG PO TABS: 100.0000 mg | ORAL_TABLET | Freq: Two times a day (BID) | ORAL | 0 refills | Status: DC

## 2017-02-05 MED ORDER — LOSARTAN POTASSIUM 25 MG PO TABS: 25.0000 mg | ORAL_TABLET | Freq: Every day | ORAL | 0 refills | Status: DC

## 2017-02-05 NOTE — Plan of Care (Signed)
Problem: Tissue Perfusion: Goal: Risk factors for ineffective tissue perfusion will decrease Outcome: Completed/Met Date Met: 02/05/17 O2 dependent at baseline

## 2017-02-05 NOTE — Progress Notes (Signed)
Discharge instructions reviewed with patient and her son, questions answered, verbalized understanding.  Discussed wound care of pacemaker placement site, patient and son verbalize understanding.   Patient transported to front of hospital via wheelchair to be taken home by son.

## 2017-02-05 NOTE — Progress Notes (Signed)
Progress Note  Patient Name: Alejandra [name] Date of Encounter: 02/05/2017  Primary Cardiologist: Ellyn Hack  Subjective   Pacemaker implanted without issue yesterday. Continues to be dizzy. Had episodes of atrial fibrillation overnight.  Inpatient Medications    Scheduled Meds: . acetaminophen  650 mg Oral Q6H  . aspirin EC  81 mg Oral Daily  . Chlorhexidine Gluconate Cloth  6 each Topical Q0600  . feeding supplement (GLUCERNA SHAKE)  237 mL Oral TID BM  . insulin aspart  0-15 Units Subcutaneous TID WC  . insulin aspart  0-5 Units Subcutaneous QHS  . losartan  25 mg Oral Daily  . metoprolol tartrate  50 mg Oral BID  . mupirocin ointment  1 application Nasal BID  . pantoprazole  40 mg Oral Daily  . pravastatin  20 mg Oral q1800  . sodium chloride flush  3 mL Intravenous Q12H  . tiotropium  18 mcg Inhalation Daily  . traMADol  50 mg Oral Q6H   Continuous Infusions:  PRN Meds: acetaminophen, ondansetron (ZOFRAN) IV, oxyCODONE   Vital Signs    Vitals:   02/04/17 1652 02/04/17 1700 02/04/17 2055 02/05/17 0356  BP:  (!) 160/112 (!) 152/81 (!) 128/93  Pulse: (!) 160  92 72  Resp: (!) 0 14 16 18   Temp:  98.1 F (36.7 C) 98.4 F (36.9 C) 98.4 F (36.9 C)  TempSrc:   Oral Oral  SpO2: (!) 0% 98% 97% 97%  Weight:    142 lb 8 oz (64.6 kg)  Height:        Intake/Output Summary (Last 24 hours) at 02/05/17 0846 Last data filed at 02/05/17 0600  Gross per 24 hour  Intake              360 ml  Output              800 ml  Net             -440 ml   Filed Weights   02/03/17 0100 02/04/17 0426 02/05/17 0356  Weight: 140 lb 4.8 oz (63.6 kg) 138 lb 4.8 oz (62.7 kg) 142 lb 8 oz (64.6 kg)    Telemetry    Sinus rhythm with episodes of SVT - Personally Reviewed  ECG    SR, RBBB, LAFB - Personally Reviewed  Physical Exam   GEN: Well nourished, well developed, in no acute distress  HEENT: normal  Neck: no JVD, carotid bruits, or masses Cardiac: RRR; no murmurs, rubs, or  gallops,no edema  Respiratory:  clear to auscultation bilaterally, normal work of breathing GI: soft, nontender, nondistended, + BS MS: no deformity or atrophy  Skin: warm and dry, device site well healed Neuro:  Strength and sensation are intact Psych: euthymic mood, full affect   Labs    Chemistry  Recent Labs Lab 02/02/17 1843 02/03/17 0345 02/04/17 0336  NA 139 136 135  K 4.1 3.8 3.5  CL 101 99* 96*  CO2  --  25 29  GLUCOSE 90 183* 154*  BUN 28* 21* 14  CREATININE 1.20* 1.10* 0.87  CALCIUM  --  8.9 8.4*  GFRNONAA  --  45* 60*  GFRAA  --  53* >60  ANIONGAP  --  12 10     Hematology  Recent Labs Lab 02/02/17 1825 02/02/17 1843 02/03/17 0345 02/04/17 0336  WBC 9.1  --  7.2 6.5  RBC 4.90  --  4.62 4.50  HGB 12.9 14.3 12.3 11.9*  HCT 38.5 42.0  35.9* 35.0*  MCV 78.6  --  77.7* 77.8*  MCH 26.3  --  26.6 26.4  MCHC 33.5  --  34.3 34.0  RDW 16.0*  --  15.9* 15.8*  PLT 201  --  189 190    Cardiac EnzymesNo results for input(s): TROPONINI in the last 168 hours.   Recent Labs Lab 02/02/17 1841  TROPIPOC 0.00     BNPNo results for input(s): BNP, PROBNP in the last 168 hours.   DDimer No results for input(s): DDIMER in the last 168 hours.   Radiology    Dg Chest 2 View  Result Date: 02/05/2017 CLINICAL DATA:  Cardiac device EXAM: CHEST  2 VIEW COMPARISON:  02/02/2017 FINDINGS: Recently placed dual-chamber pacer from the left. Leads are over the right ventricular apex and right atrial appendage. No pneumothorax. Low lung volumes with mild atelectasis. Emphysema. Chronic cardiomegaly. IMPRESSION: No acute finding after pacer placement. Electronically Signed   By: Monte Fantasia M.D.   On: 02/05/2017 07:57    Cardiac Studies   TTE 09/2016 - Left ventricle: The cavity size was normal. Wall thickness was   increased in a pattern of mild LVH. Systolic function was normal.   The estimated ejection fraction was in the range of 55% to 60%.   Wall motion was  normal; there were no regional wall motion   abnormalities. Doppler parameters are consistent with abnormal   left ventricular relaxation (grade 1 diastolic dysfunction). The   E/e&' ratio is between 8-15, suggesting indeterminate LV filling   pressure. - Left atrium: The atrium was normal in size. - Right ventricle: The cavity size was mildly dilated. Systolic   function was normal. - Right atrium: The atrium is severely dilated. - Tricuspid valve: There was moderate regurgitation. - Pulmonary arteries: PA peak pressure: 52 mm Hg (S). - Inferior vena cava: The vessel was normal in size. The   respirophasic diameter changes were in the normal range (>= 50%),   consistent with normal central venous pressure.  Patient Profile     83 y.o. female  with a hx of AAA underwent endovascular repair in 2004, PE in 2011, COPD on home O2, DM, HTN, HLD, new Afib/flutter/MAT noted pre-op to cholecystectomy in March this year already on chronic a/c 2/2 PE and was felt to have been triggered by pain and BG issues, who is being seen today for the evaluation of syncope at the request of Dr. Wynetta Emery.  Assessment & Plan    Stokes Adams Syncope: Pacemaker implanted yesterday without issue. CXR and interrogation with stable leads. Has follow up in device clinic in 10 days. Dressing left on wound. If discharged today, should have dressing removed with steri strips remaining over the incision.   Atrial fibrillation: seen on pacemaker. Currently on coumadin. Goal INR >2. Has had rapid rates when in atrial fibrillation. Reda Citron increase metoprolol to 100 mg BID.  This patients CHA2DS2-VASc Score and unadjusted Ischemic Stroke Rate (% per year) is equal to 9.7 % stroke rate/year from a score of 6  Above score calculated as 1 point each if present [CHF, HTN, DM, Vascular=MI/PAD/Aortic Plaque, Age if 65-74, or Female] Above score calculated as 2 points each if present [Age > 75, or Stroke/TIA/TE]   Yoshimi Sarr Curt Bears,  MD 02/05/2017 8:46 AM

## 2017-02-05 NOTE — Progress Notes (Signed)
Discharge instructions reviewed with patients daughter over the phone.  Daughter requests that these be gone over with son when he comes to pick patient up today.  Per case management note home health already set up with [facility].  Awaiting arrival of son.
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
## 2017-02-07 ENCOUNTER — Encounter (HOSPITAL_COMMUNITY): Payer: Self-pay | Admitting: Cardiology

## 2017-02-07 ENCOUNTER — Encounter: Payer: Self-pay | Admitting: Family

## 2017-02-09 DIAGNOSIS — S2220XD Unspecified fracture of sternum, subsequent encounter for fracture with routine healing: Secondary | ICD-10-CM | POA: Diagnosis not present

## 2017-02-09 DIAGNOSIS — Z7982 Long term (current) use of aspirin: Secondary | ICD-10-CM | POA: Diagnosis not present

## 2017-02-09 DIAGNOSIS — I480 Paroxysmal atrial fibrillation: Secondary | ICD-10-CM | POA: Diagnosis not present

## 2017-02-09 DIAGNOSIS — I129 Hypertensive chronic kidney disease with stage 1 through stage 4 chronic kidney disease, or unspecified chronic kidney disease: Secondary | ICD-10-CM | POA: Diagnosis not present

## 2017-02-09 DIAGNOSIS — Z86711 Personal history of pulmonary embolism: Secondary | ICD-10-CM | POA: Diagnosis not present

## 2017-02-09 DIAGNOSIS — E1122 Type 2 diabetes mellitus with diabetic chronic kidney disease: Secondary | ICD-10-CM | POA: Diagnosis not present

## 2017-02-09 DIAGNOSIS — N183 Chronic kidney disease, stage 3 (moderate): Secondary | ICD-10-CM | POA: Diagnosis not present

## 2017-02-09 DIAGNOSIS — Z7901 Long term (current) use of anticoagulants: Secondary | ICD-10-CM | POA: Diagnosis not present

## 2017-02-09 DIAGNOSIS — Z9981 Dependence on supplemental oxygen: Secondary | ICD-10-CM | POA: Diagnosis not present

## 2017-02-09 DIAGNOSIS — J449 Chronic obstructive pulmonary disease, unspecified: Secondary | ICD-10-CM | POA: Diagnosis not present

## 2017-02-09 NOTE — Progress Notes (Signed)
Alejandra [name] Syncope

## 2017-02-10 ENCOUNTER — Ambulatory Visit: Payer: Medicare HMO | Admitting: General Practice

## 2017-02-10 ENCOUNTER — Encounter: Payer: Self-pay | Admitting: Family

## 2017-02-10 ENCOUNTER — Ambulatory Visit (INDEPENDENT_AMBULATORY_CARE_PROVIDER_SITE_OTHER): Payer: Medicare HMO | Admitting: Family

## 2017-02-10 ENCOUNTER — Other Ambulatory Visit (INDEPENDENT_AMBULATORY_CARE_PROVIDER_SITE_OTHER): Payer: Medicare HMO

## 2017-02-10 VITALS — BP 122/80 | Temp 97.6°F | Resp 16 | Ht 65.0 in | Wt 137.0 lb

## 2017-02-10 DIAGNOSIS — S2222XD Fracture of body of sternum, subsequent encounter for fracture with routine healing: Secondary | ICD-10-CM

## 2017-02-10 DIAGNOSIS — I459 Conduction disorder, unspecified: Secondary | ICD-10-CM | POA: Diagnosis not present

## 2017-02-10 DIAGNOSIS — Z5181 Encounter for therapeutic drug level monitoring: Secondary | ICD-10-CM

## 2017-02-10 LAB — BASIC METABOLIC PANEL
BUN: 29 mg/dL — ABNORMAL HIGH (ref 6–23)
CALCIUM: 9.5 mg/dL (ref 8.4–10.5)
CO2: 30 meq/L (ref 19–32)
CREATININE: 1.28 mg/dL — AB (ref 0.40–1.20)
Chloride: 97 mEq/L (ref 96–112)
GFR: 51.21 mL/min — ABNORMAL LOW (ref >60.00)
Glucose, Bld: 109 mg/dL — ABNORMAL HIGH (ref 70–99)
Potassium: 3.8 mEq/L (ref 3.5–5.1)
SODIUM: 137 meq/L (ref 135–145)

## 2017-02-10 LAB — CBC
HCT: 37.5 % (ref 36.0–46.0)
Hemoglobin: 12.5 g/dL (ref 12.0–15.0)
MCHC: 33.3 g/dL (ref 30.0–36.0)
MCV: 81.8 fl (ref 78.0–100.0)
PLATELETS: 245 10*3/uL (ref 150.0–400.0)
RBC: 4.58 Mil/uL (ref 3.87–5.11)
RDW: 15.9 % — ABNORMAL HIGH (ref 11.5–15.5)
WBC: 6.1 10*3/uL (ref 4.0–10.5)

## 2017-02-10 LAB — POCT INR: INR: 1.9

## 2017-02-10 MED ORDER — OXYCODONE HCL 5 MG PO TABS: 2.5000 mg | ORAL_TABLET | Freq: Four times a day (QID) | ORAL | 0 refills | Status: DC | PRN

## 2017-02-10 NOTE — Patient Instructions (Signed)
Thank you for choosing [facility].  SUMMARY AND INSTRUCTIONS:  Continue to take your medications as prescribed.  Tylenol 500-650 mg 3x per day scheduled.   Oxycodone as needed for pain not controlled by the tylenol.   Continue with the stool softener (docusate softner) .  Medication:  Your prescription(s) have been submitted to your pharmacy or been printed and provided for you. Please take as directed and contact our office if you believe you are having problem(s) with the medication(s) or have any questions.  Labs:  Please stop by the lab on the lower level of the building for your blood work. Your results will be released to [place] (or called to you) after review, usually within 72 hours after test completion. If any changes need to be made, you will be notified at that same time.  1.) The lab is open from 7:30am to 5:30 pm Monday-Friday 2.) No appointment is necessary 3.) Fasting (if needed) is 6-8 hours after food and drink; black coffee and water are okay   Imaging / Radiology:  Please stop by radiology on the basement level of the building for your x-rays. Your results will be released to [place] (or called to you) after review, usually within 72 hours after test completion. If any treatments or changes are necessary, you will be notified at that same time.  Referrals:  Referrals have been made during this visit. You should expect to hear back from our schedulers in about 7-10 days in regards to establishing an appointment with the specialists we discussed.   Follow up:  If your symptoms worsen or fail to improve, please contact our office for further instruction, or in case of emergency go directly to the emergency room at the closest medical facility.
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
## 2017-02-10 NOTE — Assessment & Plan Note (Signed)
Sternal fracture appears stable with pain management being adequate. Encouraged schedule Tylenol and oxycodone taken as needed for breakthrough pain. Oxycodone usage anticipated for brief usage. Advised to continue stool softener to prevent constipation. NCCSD reviewed. Follow up in 3 months or sooner if needed.

## 2017-02-11 ENCOUNTER — Telehealth: Payer: Self-pay

## 2017-02-11 ENCOUNTER — Telehealth: Payer: Self-pay | Admitting: Family

## 2017-02-11 DIAGNOSIS — Z7901 Long term (current) use of anticoagulants: Secondary | ICD-10-CM | POA: Diagnosis not present

## 2017-02-11 DIAGNOSIS — S2220XD Unspecified fracture of sternum, subsequent encounter for fracture with routine healing: Secondary | ICD-10-CM | POA: Diagnosis not present

## 2017-02-11 DIAGNOSIS — N183 Chronic kidney disease, stage 3 (moderate): Secondary | ICD-10-CM | POA: Diagnosis not present

## 2017-02-11 DIAGNOSIS — Z7982 Long term (current) use of aspirin: Secondary | ICD-10-CM | POA: Diagnosis not present

## 2017-02-11 DIAGNOSIS — Z86711 Personal history of pulmonary embolism: Secondary | ICD-10-CM | POA: Diagnosis not present

## 2017-02-11 DIAGNOSIS — Z9981 Dependence on supplemental oxygen: Secondary | ICD-10-CM | POA: Diagnosis not present

## 2017-02-11 DIAGNOSIS — E1122 Type 2 diabetes mellitus with diabetic chronic kidney disease: Secondary | ICD-10-CM | POA: Diagnosis not present

## 2017-02-11 DIAGNOSIS — I480 Paroxysmal atrial fibrillation: Secondary | ICD-10-CM | POA: Diagnosis not present

## 2017-02-11 DIAGNOSIS — I129 Hypertensive chronic kidney disease with stage 1 through stage 4 chronic kidney disease, or unspecified chronic kidney disease: Secondary | ICD-10-CM | POA: Diagnosis not present

## 2017-02-11 DIAGNOSIS — J449 Chronic obstructive pulmonary disease, unspecified: Secondary | ICD-10-CM | POA: Diagnosis not present

## 2017-02-11 NOTE — Telephone Encounter (Signed)
Verbal Order FOR PT 2x2 1x2  [phone] [facility]   PT stated they are having trouble with her, she is not wanting to keep her O2 on.

## 2017-02-11 NOTE — Telephone Encounter (Signed)
Patient is on the list for [employer] 2018 and may be a good candidate for an AWV. Please let me know if/when appt is scheduled.   

## 2017-02-11 NOTE — Telephone Encounter (Signed)
Called Jeanine back and ok'd verbal orders per Marya Amsler. She asked if you felt like it was necessary that pt having nursing as well due to having new pacemaker. Please advise

## 2017-02-11 NOTE — Telephone Encounter (Signed)
Okay to add nursing if needed

## 2017-02-14 DIAGNOSIS — Z9981 Dependence on supplemental oxygen: Secondary | ICD-10-CM | POA: Diagnosis not present

## 2017-02-14 DIAGNOSIS — Z86711 Personal history of pulmonary embolism: Secondary | ICD-10-CM | POA: Diagnosis not present

## 2017-02-14 DIAGNOSIS — Z7982 Long term (current) use of aspirin: Secondary | ICD-10-CM | POA: Diagnosis not present

## 2017-02-14 DIAGNOSIS — Z7901 Long term (current) use of anticoagulants: Secondary | ICD-10-CM | POA: Diagnosis not present

## 2017-02-14 DIAGNOSIS — I129 Hypertensive chronic kidney disease with stage 1 through stage 4 chronic kidney disease, or unspecified chronic kidney disease: Secondary | ICD-10-CM | POA: Diagnosis not present

## 2017-02-14 DIAGNOSIS — I480 Paroxysmal atrial fibrillation: Secondary | ICD-10-CM | POA: Diagnosis not present

## 2017-02-14 DIAGNOSIS — J449 Chronic obstructive pulmonary disease, unspecified: Secondary | ICD-10-CM | POA: Diagnosis not present

## 2017-02-14 DIAGNOSIS — N183 Chronic kidney disease, stage 3 (moderate): Secondary | ICD-10-CM | POA: Diagnosis not present

## 2017-02-14 DIAGNOSIS — S2220XD Unspecified fracture of sternum, subsequent encounter for fracture with routine healing: Secondary | ICD-10-CM | POA: Diagnosis not present

## 2017-02-14 DIAGNOSIS — E1122 Type 2 diabetes mellitus with diabetic chronic kidney disease: Secondary | ICD-10-CM | POA: Diagnosis not present

## 2017-02-15 ENCOUNTER — Telehealth: Payer: Self-pay | Admitting: Family

## 2017-02-15 NOTE — Telephone Encounter (Signed)
Requesting verbal for a social work evaluation.

## 2017-02-15 NOTE — Telephone Encounter (Signed)
Notified Cindy w/Greg response.Marland KitchenJohny [name]

## 2017-02-15 NOTE — Telephone Encounter (Signed)
Social work would be appropriate to help with her situation.

## 2017-02-16 ENCOUNTER — Ambulatory Visit (INDEPENDENT_AMBULATORY_CARE_PROVIDER_SITE_OTHER): Payer: Medicare HMO | Admitting: Family

## 2017-02-16 ENCOUNTER — Ambulatory Visit (INDEPENDENT_AMBULATORY_CARE_PROVIDER_SITE_OTHER): Payer: Medicare HMO | Admitting: *Deleted

## 2017-02-16 ENCOUNTER — Encounter: Payer: Self-pay | Admitting: Family

## 2017-02-16 ENCOUNTER — Ambulatory Visit (HOSPITAL_COMMUNITY)
Admission: RE | Admit: 2017-02-16 | Discharge: 2017-02-16 | Disposition: A | Discharge status: Home / Self Care | Payer: Medicare HMO | Source: Ambulatory Visit | Attending: Vascular Surgery | Admitting: Vascular Surgery

## 2017-02-16 VITALS — BP 125/84 | HR 74 | Temp 97.0°F | Resp 18 | Ht 65.0 in | Wt 137.0 lb

## 2017-02-16 DIAGNOSIS — I482 Chronic atrial fibrillation, unspecified: Secondary | ICD-10-CM

## 2017-02-16 DIAGNOSIS — R55 Syncope and collapse: Secondary | ICD-10-CM

## 2017-02-16 DIAGNOSIS — Z95828 Presence of other vascular implants and grafts: Secondary | ICD-10-CM | POA: Insufficient documentation

## 2017-02-16 DIAGNOSIS — I714 Abdominal aortic aneurysm, without rupture, unspecified: Secondary | ICD-10-CM

## 2017-02-16 DIAGNOSIS — Z4889 Encounter for other specified surgical aftercare: Secondary | ICD-10-CM | POA: Insufficient documentation

## 2017-02-16 DIAGNOSIS — Z48812 Encounter for surgical aftercare following surgery on the circulatory system: Secondary | ICD-10-CM

## 2017-02-16 LAB — CUP PACEART INCLINIC DEVICE CHECK
Battery Voltage: 2.98 V
Brady Statistic RV Percent Paced: 0.51 %
Date Time Interrogation Session: 20180801135334
Implantable Lead Implant Date: 20180720
Implantable Lead Location: 753859
Implantable Pulse Generator Implant Date: 20180720
Lead Channel Impedance Value: 700 Ohm
Lead Channel Pacing Threshold Amplitude: 0.5 V
Lead Channel Pacing Threshold Amplitude: 0.75 V
Lead Channel Pacing Threshold Pulse Width: 0.5 ms
Lead Channel Setting Sensing Sensitivity: 2 mV
MDC IDC LEAD IMPLANT DT: 20180720
MDC IDC LEAD LOCATION: 753860
MDC IDC MSMT BATTERY REMAINING LONGEVITY: 133 mo
MDC IDC MSMT LEADCHNL RA IMPEDANCE VALUE: 475 Ohm
MDC IDC MSMT LEADCHNL RA SENSING INTR AMPL: 5 mV
MDC IDC MSMT LEADCHNL RV PACING THRESHOLD PULSEWIDTH: 0.5 ms
MDC IDC MSMT LEADCHNL RV SENSING INTR AMPL: 12 mV
MDC IDC SET LEADCHNL RA PACING AMPLITUDE: 3.5 V
MDC IDC SET LEADCHNL RV PACING AMPLITUDE: 3.5 V
MDC IDC SET LEADCHNL RV PACING PULSEWIDTH: 0.5 ms
MDC IDC STAT BRADY RA PERCENT PACED: 4.6 %
Pulse Gen Model: 2272
Pulse Gen Serial Number: 8917538
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
## 2017-02-16 NOTE — Progress Notes (Signed)
Severe protein calorie malnutrition

## 2017-02-16 NOTE — Progress Notes (Signed)
Wound check appointment. Steri-strips removed. Wound without redness or edema. Incision edges approximated, wound well healed. Normal device function. Thresholds, sensing, and impedances consistent with implant measurements. Device programmed at 3.5V for extra safety margin until 3 month visit. Histogram distribution appropriate for patient and level of activity while in SR; V rates elevated during AMS per trends. 11% AT/AF burden, +warfarin. 21 high ventricular rates noted--AF w/RVR per available EGMs, longest 2hrs 30min, +metoprolol tartrate 100mg  BID, patient asymptomatic. Patient and family educated about wound care, arm mobility, lifting restrictions, and Merlin monitor. ROV with WC on 05/09/17.

## 2017-02-17 DIAGNOSIS — Z7901 Long term (current) use of anticoagulants: Secondary | ICD-10-CM | POA: Diagnosis not present

## 2017-02-17 DIAGNOSIS — Z9981 Dependence on supplemental oxygen: Secondary | ICD-10-CM | POA: Diagnosis not present

## 2017-02-17 DIAGNOSIS — Z86711 Personal history of pulmonary embolism: Secondary | ICD-10-CM | POA: Diagnosis not present

## 2017-02-17 DIAGNOSIS — S2220XD Unspecified fracture of sternum, subsequent encounter for fracture with routine healing: Secondary | ICD-10-CM | POA: Diagnosis not present

## 2017-02-17 DIAGNOSIS — Z7982 Long term (current) use of aspirin: Secondary | ICD-10-CM | POA: Diagnosis not present

## 2017-02-17 DIAGNOSIS — I129 Hypertensive chronic kidney disease with stage 1 through stage 4 chronic kidney disease, or unspecified chronic kidney disease: Secondary | ICD-10-CM | POA: Diagnosis not present

## 2017-02-17 DIAGNOSIS — E1122 Type 2 diabetes mellitus with diabetic chronic kidney disease: Secondary | ICD-10-CM | POA: Diagnosis not present

## 2017-02-17 DIAGNOSIS — J449 Chronic obstructive pulmonary disease, unspecified: Secondary | ICD-10-CM | POA: Diagnosis not present

## 2017-02-17 DIAGNOSIS — N183 Chronic kidney disease, stage 3 (moderate): Secondary | ICD-10-CM | POA: Diagnosis not present

## 2017-02-17 DIAGNOSIS — I480 Paroxysmal atrial fibrillation: Secondary | ICD-10-CM | POA: Diagnosis not present

## 2017-02-18 DIAGNOSIS — J449 Chronic obstructive pulmonary disease, unspecified: Secondary | ICD-10-CM | POA: Diagnosis not present

## 2017-02-18 DIAGNOSIS — I480 Paroxysmal atrial fibrillation: Secondary | ICD-10-CM | POA: Diagnosis not present

## 2017-02-18 DIAGNOSIS — Z7982 Long term (current) use of aspirin: Secondary | ICD-10-CM | POA: Diagnosis not present

## 2017-02-18 DIAGNOSIS — E1122 Type 2 diabetes mellitus with diabetic chronic kidney disease: Secondary | ICD-10-CM | POA: Diagnosis not present

## 2017-02-18 DIAGNOSIS — Z86711 Personal history of pulmonary embolism: Secondary | ICD-10-CM | POA: Diagnosis not present

## 2017-02-18 DIAGNOSIS — Z7901 Long term (current) use of anticoagulants: Secondary | ICD-10-CM | POA: Diagnosis not present

## 2017-02-18 DIAGNOSIS — Z9981 Dependence on supplemental oxygen: Secondary | ICD-10-CM | POA: Diagnosis not present

## 2017-02-18 DIAGNOSIS — S2220XD Unspecified fracture of sternum, subsequent encounter for fracture with routine healing: Secondary | ICD-10-CM | POA: Diagnosis not present

## 2017-02-18 DIAGNOSIS — N183 Chronic kidney disease, stage 3 (moderate): Secondary | ICD-10-CM | POA: Diagnosis not present

## 2017-02-18 DIAGNOSIS — I129 Hypertensive chronic kidney disease with stage 1 through stage 4 chronic kidney disease, or unspecified chronic kidney disease: Secondary | ICD-10-CM | POA: Diagnosis not present

## 2017-02-24 ENCOUNTER — Telehealth: Payer: Self-pay | Admitting: Family

## 2017-02-24 NOTE — Telephone Encounter (Signed)
Pt daughter called stating the Pts grandson stays with her, she states she needs a letter stating due to her health she can no longer care for him and the grandson is going to live  with his uncle and will be changing schools. I believe they need the letter for the school purpose. Please call back in regard

## 2017-02-25 ENCOUNTER — Encounter: Payer: Self-pay | Admitting: Family

## 2017-02-25 NOTE — Telephone Encounter (Signed)
Letter printed.

## 2017-02-25 NOTE — Telephone Encounter (Signed)
Letter ready for pick up. Pt is aware.

## 2017-02-25 NOTE — Telephone Encounter (Signed)
Patient has called back.  I told her we would call as soon as letter was up front for pickup.

## 2017-03-01 ENCOUNTER — Encounter: Payer: Self-pay | Admitting: Family

## 2017-03-02 DIAGNOSIS — I480 Paroxysmal atrial fibrillation: Secondary | ICD-10-CM | POA: Diagnosis not present

## 2017-03-02 DIAGNOSIS — N183 Chronic kidney disease, stage 3 (moderate): Secondary | ICD-10-CM | POA: Diagnosis not present

## 2017-03-02 DIAGNOSIS — S2220XD Unspecified fracture of sternum, subsequent encounter for fracture with routine healing: Secondary | ICD-10-CM | POA: Diagnosis not present

## 2017-03-02 DIAGNOSIS — Z86711 Personal history of pulmonary embolism: Secondary | ICD-10-CM | POA: Diagnosis not present

## 2017-03-02 DIAGNOSIS — J449 Chronic obstructive pulmonary disease, unspecified: Secondary | ICD-10-CM | POA: Diagnosis not present

## 2017-03-02 DIAGNOSIS — I129 Hypertensive chronic kidney disease with stage 1 through stage 4 chronic kidney disease, or unspecified chronic kidney disease: Secondary | ICD-10-CM | POA: Diagnosis not present

## 2017-03-02 DIAGNOSIS — E1122 Type 2 diabetes mellitus with diabetic chronic kidney disease: Secondary | ICD-10-CM | POA: Diagnosis not present

## 2017-03-02 DIAGNOSIS — Z7901 Long term (current) use of anticoagulants: Secondary | ICD-10-CM | POA: Diagnosis not present

## 2017-03-02 DIAGNOSIS — Z7982 Long term (current) use of aspirin: Secondary | ICD-10-CM | POA: Diagnosis not present

## 2017-03-02 DIAGNOSIS — Z9981 Dependence on supplemental oxygen: Secondary | ICD-10-CM | POA: Diagnosis not present

## 2017-03-02 DIAGNOSIS — Z950 Presence of cardiac pacemaker: Secondary | ICD-10-CM | POA: Diagnosis not present

## 2017-03-02 DIAGNOSIS — Z7984 Long term (current) use of oral hypoglycemic drugs: Secondary | ICD-10-CM | POA: Diagnosis not present

## 2017-03-03 DIAGNOSIS — Z86711 Personal history of pulmonary embolism: Secondary | ICD-10-CM | POA: Diagnosis not present

## 2017-03-03 DIAGNOSIS — E1122 Type 2 diabetes mellitus with diabetic chronic kidney disease: Secondary | ICD-10-CM | POA: Diagnosis not present

## 2017-03-03 DIAGNOSIS — J449 Chronic obstructive pulmonary disease, unspecified: Secondary | ICD-10-CM | POA: Diagnosis not present

## 2017-03-03 DIAGNOSIS — Z9981 Dependence on supplemental oxygen: Secondary | ICD-10-CM | POA: Diagnosis not present

## 2017-03-03 DIAGNOSIS — I129 Hypertensive chronic kidney disease with stage 1 through stage 4 chronic kidney disease, or unspecified chronic kidney disease: Secondary | ICD-10-CM | POA: Diagnosis not present

## 2017-03-03 DIAGNOSIS — I480 Paroxysmal atrial fibrillation: Secondary | ICD-10-CM | POA: Diagnosis not present

## 2017-03-03 DIAGNOSIS — Z7901 Long term (current) use of anticoagulants: Secondary | ICD-10-CM | POA: Diagnosis not present

## 2017-03-03 DIAGNOSIS — S2220XD Unspecified fracture of sternum, subsequent encounter for fracture with routine healing: Secondary | ICD-10-CM | POA: Diagnosis not present

## 2017-03-03 DIAGNOSIS — Z7982 Long term (current) use of aspirin: Secondary | ICD-10-CM | POA: Diagnosis not present

## 2017-03-03 DIAGNOSIS — N183 Chronic kidney disease, stage 3 (moderate): Secondary | ICD-10-CM | POA: Diagnosis not present

## 2017-03-05 DIAGNOSIS — J129 Viral pneumonia, unspecified: Secondary | ICD-10-CM | POA: Diagnosis not present

## 2017-03-05 DIAGNOSIS — I2782 Chronic pulmonary embolism: Secondary | ICD-10-CM | POA: Diagnosis not present

## 2017-03-05 DIAGNOSIS — J449 Chronic obstructive pulmonary disease, unspecified: Secondary | ICD-10-CM | POA: Diagnosis not present

## 2017-03-09 DIAGNOSIS — Z9981 Dependence on supplemental oxygen: Secondary | ICD-10-CM | POA: Diagnosis not present

## 2017-03-09 DIAGNOSIS — Z86711 Personal history of pulmonary embolism: Secondary | ICD-10-CM | POA: Diagnosis not present

## 2017-03-09 DIAGNOSIS — I129 Hypertensive chronic kidney disease with stage 1 through stage 4 chronic kidney disease, or unspecified chronic kidney disease: Secondary | ICD-10-CM | POA: Diagnosis not present

## 2017-03-09 DIAGNOSIS — Z7982 Long term (current) use of aspirin: Secondary | ICD-10-CM | POA: Diagnosis not present

## 2017-03-09 DIAGNOSIS — Z7901 Long term (current) use of anticoagulants: Secondary | ICD-10-CM | POA: Diagnosis not present

## 2017-03-09 DIAGNOSIS — S2220XD Unspecified fracture of sternum, subsequent encounter for fracture with routine healing: Secondary | ICD-10-CM | POA: Diagnosis not present

## 2017-03-09 DIAGNOSIS — N183 Chronic kidney disease, stage 3 (moderate): Secondary | ICD-10-CM | POA: Diagnosis not present

## 2017-03-09 DIAGNOSIS — E1122 Type 2 diabetes mellitus with diabetic chronic kidney disease: Secondary | ICD-10-CM | POA: Diagnosis not present

## 2017-03-09 DIAGNOSIS — J449 Chronic obstructive pulmonary disease, unspecified: Secondary | ICD-10-CM | POA: Diagnosis not present

## 2017-03-09 DIAGNOSIS — I480 Paroxysmal atrial fibrillation: Secondary | ICD-10-CM | POA: Diagnosis not present

## 2017-03-09 NOTE — Addendum Note (Signed)
Addended by: Lianne Cure A on: 03/09/2017 12:16 PM   Modules accepted: Orders

## 2017-03-15 DIAGNOSIS — S2220XD Unspecified fracture of sternum, subsequent encounter for fracture with routine healing: Secondary | ICD-10-CM | POA: Diagnosis not present

## 2017-03-15 DIAGNOSIS — Z7982 Long term (current) use of aspirin: Secondary | ICD-10-CM | POA: Diagnosis not present

## 2017-03-15 DIAGNOSIS — N183 Chronic kidney disease, stage 3 (moderate): Secondary | ICD-10-CM | POA: Diagnosis not present

## 2017-03-15 DIAGNOSIS — Z9981 Dependence on supplemental oxygen: Secondary | ICD-10-CM | POA: Diagnosis not present

## 2017-03-15 DIAGNOSIS — Z7901 Long term (current) use of anticoagulants: Secondary | ICD-10-CM | POA: Diagnosis not present

## 2017-03-15 DIAGNOSIS — Z86711 Personal history of pulmonary embolism: Secondary | ICD-10-CM | POA: Diagnosis not present

## 2017-03-15 DIAGNOSIS — J449 Chronic obstructive pulmonary disease, unspecified: Secondary | ICD-10-CM | POA: Diagnosis not present

## 2017-03-15 DIAGNOSIS — I480 Paroxysmal atrial fibrillation: Secondary | ICD-10-CM | POA: Diagnosis not present

## 2017-03-15 DIAGNOSIS — I129 Hypertensive chronic kidney disease with stage 1 through stage 4 chronic kidney disease, or unspecified chronic kidney disease: Secondary | ICD-10-CM | POA: Diagnosis not present

## 2017-03-15 DIAGNOSIS — E1122 Type 2 diabetes mellitus with diabetic chronic kidney disease: Secondary | ICD-10-CM | POA: Diagnosis not present

## 2017-03-15 NOTE — Patient Instructions (Signed)
Pre visit review using our clinic review tool, if applicable. No additional management support is needed unless otherwise documented below in the visit note. 

## 2017-03-16 ENCOUNTER — Ambulatory Visit (INDEPENDENT_AMBULATORY_CARE_PROVIDER_SITE_OTHER): Payer: Medicare HMO | Admitting: General Practice

## 2017-03-16 DIAGNOSIS — Z5181 Encounter for therapeutic drug level monitoring: Secondary | ICD-10-CM

## 2017-03-16 LAB — POCT INR: INR: 2.3

## 2017-03-22 DIAGNOSIS — I129 Hypertensive chronic kidney disease with stage 1 through stage 4 chronic kidney disease, or unspecified chronic kidney disease: Secondary | ICD-10-CM | POA: Diagnosis not present

## 2017-03-22 DIAGNOSIS — I480 Paroxysmal atrial fibrillation: Secondary | ICD-10-CM | POA: Diagnosis not present

## 2017-03-22 DIAGNOSIS — Z86711 Personal history of pulmonary embolism: Secondary | ICD-10-CM | POA: Diagnosis not present

## 2017-03-22 DIAGNOSIS — Z9981 Dependence on supplemental oxygen: Secondary | ICD-10-CM | POA: Diagnosis not present

## 2017-03-22 DIAGNOSIS — N183 Chronic kidney disease, stage 3 (moderate): Secondary | ICD-10-CM | POA: Diagnosis not present

## 2017-03-22 DIAGNOSIS — S2220XD Unspecified fracture of sternum, subsequent encounter for fracture with routine healing: Secondary | ICD-10-CM | POA: Diagnosis not present

## 2017-03-22 DIAGNOSIS — E1122 Type 2 diabetes mellitus with diabetic chronic kidney disease: Secondary | ICD-10-CM | POA: Diagnosis not present

## 2017-03-22 DIAGNOSIS — J449 Chronic obstructive pulmonary disease, unspecified: Secondary | ICD-10-CM | POA: Diagnosis not present

## 2017-03-22 DIAGNOSIS — Z7982 Long term (current) use of aspirin: Secondary | ICD-10-CM | POA: Diagnosis not present

## 2017-03-22 DIAGNOSIS — Z7901 Long term (current) use of anticoagulants: Secondary | ICD-10-CM | POA: Diagnosis not present

## 2017-03-29 DIAGNOSIS — J449 Chronic obstructive pulmonary disease, unspecified: Secondary | ICD-10-CM | POA: Diagnosis not present

## 2017-03-29 DIAGNOSIS — I129 Hypertensive chronic kidney disease with stage 1 through stage 4 chronic kidney disease, or unspecified chronic kidney disease: Secondary | ICD-10-CM | POA: Diagnosis not present

## 2017-03-29 DIAGNOSIS — Z9981 Dependence on supplemental oxygen: Secondary | ICD-10-CM | POA: Diagnosis not present

## 2017-03-29 DIAGNOSIS — Z7901 Long term (current) use of anticoagulants: Secondary | ICD-10-CM | POA: Diagnosis not present

## 2017-03-29 DIAGNOSIS — S2220XD Unspecified fracture of sternum, subsequent encounter for fracture with routine healing: Secondary | ICD-10-CM | POA: Diagnosis not present

## 2017-03-29 DIAGNOSIS — N183 Chronic kidney disease, stage 3 (moderate): Secondary | ICD-10-CM | POA: Diagnosis not present

## 2017-03-29 DIAGNOSIS — I480 Paroxysmal atrial fibrillation: Secondary | ICD-10-CM | POA: Diagnosis not present

## 2017-03-29 DIAGNOSIS — Z86711 Personal history of pulmonary embolism: Secondary | ICD-10-CM | POA: Diagnosis not present

## 2017-03-29 DIAGNOSIS — Z7982 Long term (current) use of aspirin: Secondary | ICD-10-CM | POA: Diagnosis not present

## 2017-03-29 DIAGNOSIS — E1122 Type 2 diabetes mellitus with diabetic chronic kidney disease: Secondary | ICD-10-CM | POA: Diagnosis not present

## 2017-04-02 DIAGNOSIS — I480 Paroxysmal atrial fibrillation: Secondary | ICD-10-CM | POA: Diagnosis not present

## 2017-04-02 DIAGNOSIS — E1122 Type 2 diabetes mellitus with diabetic chronic kidney disease: Secondary | ICD-10-CM | POA: Diagnosis not present

## 2017-04-02 DIAGNOSIS — S2220XD Unspecified fracture of sternum, subsequent encounter for fracture with routine healing: Secondary | ICD-10-CM | POA: Diagnosis not present

## 2017-04-02 DIAGNOSIS — Z86711 Personal history of pulmonary embolism: Secondary | ICD-10-CM | POA: Diagnosis not present

## 2017-04-02 DIAGNOSIS — I129 Hypertensive chronic kidney disease with stage 1 through stage 4 chronic kidney disease, or unspecified chronic kidney disease: Secondary | ICD-10-CM | POA: Diagnosis not present

## 2017-04-02 DIAGNOSIS — N183 Chronic kidney disease, stage 3 (moderate): Secondary | ICD-10-CM | POA: Diagnosis not present

## 2017-04-02 DIAGNOSIS — J449 Chronic obstructive pulmonary disease, unspecified: Secondary | ICD-10-CM | POA: Diagnosis not present

## 2017-04-02 DIAGNOSIS — Z9981 Dependence on supplemental oxygen: Secondary | ICD-10-CM | POA: Diagnosis not present

## 2017-04-02 DIAGNOSIS — Z7982 Long term (current) use of aspirin: Secondary | ICD-10-CM | POA: Diagnosis not present

## 2017-04-02 DIAGNOSIS — Z7901 Long term (current) use of anticoagulants: Secondary | ICD-10-CM | POA: Diagnosis not present

## 2017-04-05 DIAGNOSIS — I2782 Chronic pulmonary embolism: Secondary | ICD-10-CM | POA: Diagnosis not present

## 2017-04-05 DIAGNOSIS — J129 Viral pneumonia, unspecified: Secondary | ICD-10-CM | POA: Diagnosis not present

## 2017-04-05 DIAGNOSIS — J449 Chronic obstructive pulmonary disease, unspecified: Secondary | ICD-10-CM | POA: Diagnosis not present

## 2017-04-06 ENCOUNTER — Telehealth: Payer: Self-pay | Admitting: Family

## 2017-04-06 NOTE — Telephone Encounter (Signed)
Mary from [facility] called checking to see if we received their fax from last week. She said that the pt is needing an order for a Bone Density Test. Can these orders be put in?

## 2017-04-06 NOTE — Telephone Encounter (Signed)
Bone density scan set up. Pt is aware.

## 2017-04-12 ENCOUNTER — Other Ambulatory Visit: Payer: Self-pay | Admitting: Family

## 2017-04-12 DIAGNOSIS — E2839 Other primary ovarian failure: Secondary | ICD-10-CM

## 2017-04-13 ENCOUNTER — Ambulatory Visit (INDEPENDENT_AMBULATORY_CARE_PROVIDER_SITE_OTHER)
Admission: RE | Admit: 2017-04-13 | Discharge: 2017-04-13 | Disposition: A | Discharge status: Home / Self Care | Payer: Medicare HMO | Source: Ambulatory Visit | Attending: Family | Admitting: Family

## 2017-04-13 ENCOUNTER — Ambulatory Visit (INDEPENDENT_AMBULATORY_CARE_PROVIDER_SITE_OTHER): Payer: Medicare HMO | Admitting: General Practice

## 2017-04-13 DIAGNOSIS — Z7901 Long term (current) use of anticoagulants: Secondary | ICD-10-CM | POA: Diagnosis not present

## 2017-04-13 DIAGNOSIS — Z5181 Encounter for therapeutic drug level monitoring: Secondary | ICD-10-CM

## 2017-04-13 DIAGNOSIS — E2839 Other primary ovarian failure: Secondary | ICD-10-CM | POA: Diagnosis not present

## 2017-04-13 LAB — POCT INR: INR: 1.8

## 2017-04-13 NOTE — Patient Instructions (Signed)
Pre visit review using our clinic review tool, if applicable. No additional management support is needed unless otherwise documented below in the visit note. 

## 2017-04-13 NOTE — Progress Notes (Addendum)
I have reviewed and agree with the plan. Medical screening examination/treatment/procedure(s) were performed by non-physician practitioner and as supervising physician I was immediately available for consultation/collaboration. I agree with above. Lew Dawes, MD

## 2017-04-29 NOTE — Telephone Encounter (Signed)
Can you call pt and see if she will come in for an AWV

## 2017-05-05 DIAGNOSIS — I2782 Chronic pulmonary embolism: Secondary | ICD-10-CM | POA: Diagnosis not present

## 2017-05-05 DIAGNOSIS — J129 Viral pneumonia, unspecified: Secondary | ICD-10-CM | POA: Diagnosis not present

## 2017-05-05 DIAGNOSIS — J449 Chronic obstructive pulmonary disease, unspecified: Secondary | ICD-10-CM | POA: Diagnosis not present

## 2017-05-09 ENCOUNTER — Ambulatory Visit (INDEPENDENT_AMBULATORY_CARE_PROVIDER_SITE_OTHER): Payer: Medicare HMO | Admitting: Cardiology

## 2017-05-09 ENCOUNTER — Encounter: Payer: Self-pay | Admitting: Cardiology

## 2017-05-09 VITALS — BP 122/86 | HR 89 | Ht 65.0 in | Wt 137.8 lb

## 2017-05-09 DIAGNOSIS — R55 Syncope and collapse: Secondary | ICD-10-CM | POA: Diagnosis not present

## 2017-05-09 DIAGNOSIS — I10 Essential (primary) hypertension: Secondary | ICD-10-CM | POA: Diagnosis not present

## 2017-05-09 DIAGNOSIS — I480 Paroxysmal atrial fibrillation: Secondary | ICD-10-CM | POA: Diagnosis not present

## 2017-05-09 DIAGNOSIS — Z950 Presence of cardiac pacemaker: Secondary | ICD-10-CM | POA: Diagnosis not present

## 2017-05-09 DIAGNOSIS — I482 Chronic atrial fibrillation, unspecified: Secondary | ICD-10-CM

## 2017-05-09 DIAGNOSIS — R69 Illness, unspecified: Secondary | ICD-10-CM | POA: Diagnosis not present
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
## 2017-05-09 NOTE — Patient Instructions (Signed)
Medication Instructions:  Your physician recommends that you continue on your current medications as directed. Please refer to the Current Medication list given to you today.  Labwork: None ordered  Testing/Procedures: None ordered  Follow-Up: Remote monitoring is used to monitor your Pacemaker or ICD from home. This monitoring reduces the number of office visits required to check your device to one time per year. It allows [place] to keep an eye on the functioning of your device to ensure it is working properly. You are scheduled for a device check from home on 08/08/2017. You may send your transmission at any time that day. If you have a wireless device, the transmission will be sent automatically. After your physician reviews your transmission, you will receive a postcard with your next transmission date.  Your physician wants you to follow-up in: 9 months with Dr. Curt Bears.  You will receive a reminder letter in the mail two months in advance. If you don't receive a letter, please call our office to schedule the follow-up appointment.  --- If you need a refill on your cardiac medications before your next appointment, please call your pharmacy. ---  Thank you for choosing [facility]!!   Trinidad Curet, RN [phone]

## 2017-05-11 ENCOUNTER — Ambulatory Visit (INDEPENDENT_AMBULATORY_CARE_PROVIDER_SITE_OTHER): Payer: Medicare HMO | Admitting: General Practice

## 2017-05-11 DIAGNOSIS — Z7901 Long term (current) use of anticoagulants: Secondary | ICD-10-CM | POA: Diagnosis not present

## 2017-05-11 DIAGNOSIS — I2699 Other pulmonary embolism without acute cor pulmonale: Secondary | ICD-10-CM | POA: Diagnosis not present

## 2017-05-11 LAB — POCT INR: INR: 1.7

## 2017-05-11 NOTE — Patient Instructions (Signed)
Pre visit review using our clinic review tool, if applicable. No additional management support is needed unless otherwise documented below in the visit note. 

## 2017-05-29 NOTE — Progress Notes (Signed)
Medical screening examination/treatment/procedure(s) were performed by non-physician practitioner and as supervising physician I was immediately available for consultation/collaboration. I agree with above. Fredia Chittenden, MD  

## 2017-06-02 DIAGNOSIS — I10 Essential (primary) hypertension: Secondary | ICD-10-CM | POA: Diagnosis not present

## 2017-06-02 DIAGNOSIS — I2699 Other pulmonary embolism without acute cor pulmonale: Secondary | ICD-10-CM | POA: Diagnosis not present

## 2017-06-02 DIAGNOSIS — E119 Type 2 diabetes mellitus without complications: Secondary | ICD-10-CM | POA: Diagnosis not present

## 2017-06-02 DIAGNOSIS — E7800 Pure hypercholesterolemia, unspecified: Secondary | ICD-10-CM | POA: Diagnosis not present

## 2017-06-02 DIAGNOSIS — L03039 Cellulitis of unspecified toe: Secondary | ICD-10-CM | POA: Diagnosis not present

## 2017-06-02 LAB — CUP PACEART INCLINIC DEVICE CHECK
Battery Voltage: 3.01 V
Brady Statistic RA Percent Paced: 1.5 %
Brady Statistic RV Percent Paced: 0.02 %
Implantable Lead Implant Date: 20180720
Implantable Lead Location: 753860
Lead Channel Impedance Value: 412.5 Ohm
Lead Channel Pacing Threshold Pulse Width: 0.5 ms
Lead Channel Setting Pacing Amplitude: 2 V
Lead Channel Setting Pacing Amplitude: 2.5 V
Lead Channel Setting Sensing Sensitivity: 2 mV
MDC IDC LEAD IMPLANT DT: 20180720
MDC IDC LEAD LOCATION: 753859
MDC IDC MSMT BATTERY REMAINING LONGEVITY: 136 mo
MDC IDC MSMT LEADCHNL RA PACING THRESHOLD AMPLITUDE: 0.75 V
MDC IDC MSMT LEADCHNL RA SENSING INTR AMPL: 5 mV
MDC IDC MSMT LEADCHNL RV IMPEDANCE VALUE: 612.5 Ohm
MDC IDC MSMT LEADCHNL RV PACING THRESHOLD AMPLITUDE: 0.5 V
MDC IDC MSMT LEADCHNL RV PACING THRESHOLD PULSEWIDTH: 0.5 ms
MDC IDC MSMT LEADCHNL RV SENSING INTR AMPL: 11.9 mV
MDC IDC PG IMPLANT DT: 20180720
MDC IDC SESS DTM: 20181022195151
MDC IDC SET LEADCHNL RV PACING PULSEWIDTH: 0.5 ms
Pulse Gen Serial Number: 8917538

## 2017-06-05 DIAGNOSIS — I2782 Chronic pulmonary embolism: Secondary | ICD-10-CM | POA: Diagnosis not present

## 2017-06-05 DIAGNOSIS — J449 Chronic obstructive pulmonary disease, unspecified: Secondary | ICD-10-CM | POA: Diagnosis not present

## 2017-06-05 DIAGNOSIS — J129 Viral pneumonia, unspecified: Secondary | ICD-10-CM | POA: Diagnosis not present

## 2017-07-05 DIAGNOSIS — J449 Chronic obstructive pulmonary disease, unspecified: Secondary | ICD-10-CM | POA: Diagnosis not present

## 2017-07-05 DIAGNOSIS — J129 Viral pneumonia, unspecified: Secondary | ICD-10-CM | POA: Diagnosis not present

## 2017-07-05 DIAGNOSIS — I2782 Chronic pulmonary embolism: Secondary | ICD-10-CM | POA: Diagnosis not present

## 2017-08-08 ENCOUNTER — Ambulatory Visit (INDEPENDENT_AMBULATORY_CARE_PROVIDER_SITE_OTHER): Payer: Self-pay | Admitting: *Deleted

## 2017-08-08 DIAGNOSIS — I480 Paroxysmal atrial fibrillation: Secondary | ICD-10-CM

## 2017-08-08 DIAGNOSIS — R55 Syncope and collapse: Secondary | ICD-10-CM

## 2017-08-08 NOTE — Progress Notes (Signed)
Remote pacemaker transmission.   

## 2017-08-09 ENCOUNTER — Encounter: Payer: Self-pay | Admitting: Cardiology

## 2017-08-31 LAB — CUP PACEART REMOTE DEVICE CHECK
Battery Remaining Longevity: 118 mo
Battery Voltage: 3.01 V
Brady Statistic AP VP Percent: 1 %
Brady Statistic AP VS Percent: 1.1 %
Brady Statistic AS VP Percent: 1 %
Brady Statistic AS VS Percent: 99 %
Brady Statistic RA Percent Paced: 1 %
Implantable Lead Implant Date: 20180720
Implantable Lead Location: 753859
Lead Channel Impedance Value: 400 Ohm
Lead Channel Impedance Value: 490 Ohm
Lead Channel Pacing Threshold Amplitude: 0.75 V
Lead Channel Pacing Threshold Pulse Width: 0.5 ms
Lead Channel Pacing Threshold Pulse Width: 0.5 ms
Lead Channel Sensing Intrinsic Amplitude: 11.8 mV
Lead Channel Sensing Intrinsic Amplitude: 5 mV
Lead Channel Setting Pacing Amplitude: 2.5 V
MDC IDC LEAD IMPLANT DT: 20180720
MDC IDC LEAD LOCATION: 753860
MDC IDC MSMT BATTERY REMAINING PERCENTAGE: 95.5 %
MDC IDC MSMT LEADCHNL RV PACING THRESHOLD AMPLITUDE: 0.5 V
MDC IDC PG IMPLANT DT: 20180720
MDC IDC PG SERIAL: 8917538
MDC IDC SESS DTM: 20190121105932
MDC IDC SET LEADCHNL RA PACING AMPLITUDE: 2 V
MDC IDC SET LEADCHNL RV PACING PULSEWIDTH: 0.5 ms
MDC IDC SET LEADCHNL RV SENSING SENSITIVITY: 2 mV
MDC IDC STAT BRADY RV PERCENT PACED: 1 %

## 2017-09-28 ENCOUNTER — Ambulatory Visit (INDEPENDENT_AMBULATORY_CARE_PROVIDER_SITE_OTHER): Payer: Medicare Other | Admitting: Family Medicine

## 2017-09-28 ENCOUNTER — Ambulatory Visit (INDEPENDENT_AMBULATORY_CARE_PROVIDER_SITE_OTHER): Payer: Medicare Other | Admitting: General Practice

## 2017-09-28 ENCOUNTER — Encounter: Payer: Self-pay | Admitting: Family Medicine

## 2017-09-28 VITALS — BP 126/78 | HR 78 | Temp 97.5°F | Ht 65.0 in | Wt 184.2 lb

## 2017-09-28 DIAGNOSIS — N183 Chronic kidney disease, stage 3 unspecified: Secondary | ICD-10-CM

## 2017-09-28 DIAGNOSIS — I714 Abdominal aortic aneurysm, without rupture, unspecified: Secondary | ICD-10-CM

## 2017-09-28 DIAGNOSIS — E785 Hyperlipidemia, unspecified: Secondary | ICD-10-CM

## 2017-09-28 DIAGNOSIS — E1122 Type 2 diabetes mellitus with diabetic chronic kidney disease: Secondary | ICD-10-CM | POA: Diagnosis not present

## 2017-09-28 DIAGNOSIS — E1169 Type 2 diabetes mellitus with other specified complication: Secondary | ICD-10-CM | POA: Diagnosis not present

## 2017-09-28 DIAGNOSIS — E1159 Type 2 diabetes mellitus with other circulatory complications: Secondary | ICD-10-CM | POA: Diagnosis not present

## 2017-09-28 DIAGNOSIS — Z7901 Long term (current) use of anticoagulants: Secondary | ICD-10-CM

## 2017-09-28 DIAGNOSIS — I152 Hypertension secondary to endocrine disorders: Secondary | ICD-10-CM

## 2017-09-28 DIAGNOSIS — I482 Chronic atrial fibrillation, unspecified: Secondary | ICD-10-CM

## 2017-09-28 DIAGNOSIS — I10 Essential (primary) hypertension: Secondary | ICD-10-CM

## 2017-09-28 LAB — HEMOGLOBIN A1C: Hgb A1c MFr Bld: 6.1 % (ref 4.6–6.5)

## 2017-09-28 LAB — COMPREHENSIVE METABOLIC PANEL
ALK PHOS: 36 U/L — AB (ref 39–117)
ALT: 12 U/L (ref 0–35)
AST: 16 U/L (ref 0–37)
Albumin: 4 g/dL (ref 3.5–5.2)
BILIRUBIN TOTAL: 0.3 mg/dL (ref 0.2–1.2)
BUN: 28 mg/dL — AB (ref 6–23)
CO2: 31 meq/L (ref 19–32)
Calcium: 9.7 mg/dL (ref 8.4–10.5)
Chloride: 100 mEq/L (ref 96–112)
Creatinine, Ser: 1.13 mg/dL (ref 0.40–1.20)
GFR: 59.05 mL/min — AB (ref >60.00)
GLUCOSE: 93 mg/dL (ref 70–99)
Potassium: 4.6 mEq/L (ref 3.5–5.1)
SODIUM: 139 meq/L (ref 135–145)
Total Protein: 7.8 g/dL (ref 6.0–8.3)

## 2017-09-28 LAB — LIPID PANEL
CHOL/HDL RATIO: 3
Cholesterol: 190 mg/dL (ref 0–200)
HDL: 62.7 mg/dL (ref >39.00)
LDL CALC: 104 mg/dL — AB (ref 0–99)
NONHDL: 127.72
TRIGLYCERIDES: 118 mg/dL (ref 0.0–149.0)
VLDL: 23.6 mg/dL (ref 0.0–40.0)

## 2017-09-28 LAB — CBC
HCT: 36.1 % (ref 36.0–46.0)
HEMOGLOBIN: 11.9 g/dL — AB (ref 12.0–15.0)
MCHC: 33 g/dL (ref 30.0–36.0)
MCV: 83.3 fl (ref 78.0–100.0)
Platelets: 240 10*3/uL (ref 150.0–400.0)
RBC: 4.34 Mil/uL (ref 3.87–5.11)
RDW: 15.2 % (ref 11.5–15.5)
WBC: 5.3 10*3/uL (ref 4.0–10.5)

## 2017-09-28 LAB — POCT INR: INR: 2.8

## 2017-09-28 MED ORDER — LOVASTATIN 20 MG PO TABS: 20.0000 mg | ORAL_TABLET | Freq: Every day | ORAL | 3 refills | Status: DC

## 2017-09-28 NOTE — Assessment & Plan Note (Signed)
Rate controlled.  INR at goal.  Continue current dose of Coumadin.

## 2017-09-28 NOTE — Patient Instructions (Addendum)
Pre visit review using our clinic review tool, if applicable. No additional management support is needed unless otherwise documented below in the visit note.  Continue to take 1 tablet daily except 1 1/2 tablets on Monday and Friday.  Re-check in 4 weeks.

## 2017-09-28 NOTE — Patient Instructions (Signed)
It was very nice to meet you today!  We will not make any medication changes.  I want to check blood work since you have not had it done for a few years.  Come back soon for your annual wellness visit.  Come back to see me in 3 months to follow up on your diabetes and blood pressure, or sooner as needed.  Take care, Dr [name]

## 2017-09-28 NOTE — Assessment & Plan Note (Signed)
Check lipid panel.  Continue lovastatin at current dose.

## 2017-09-28 NOTE — Assessment & Plan Note (Signed)
At goal.  Continue losartan, metoprolol, and triamterene-HCTZ.  Follow-up with me in 3 months.  Check CMET today.

## 2017-09-28 NOTE — Progress Notes (Signed)
   Subjective:  Alejandra [name] is a 84 y.o. female who presents today with a chief complaint of hypertension.   HPI:  Hypertension, established problem, stable Currently on losartan 25 mg daily, metoprolol 50 mg twice daily, triamterene-HCTZ 37.5-25 capsule once daily.  She tolerates all these medications well without side effects.  No chest pain.  No lower extremity swelling.  Type 2 diabetes, established problem, stable Several year history.  Currently on glipizide 5 mg daily and metformin 500 mg twice daily.  Tolerates both these medications well without side effects.  Sugars usually in the low 120s-130s.  No polyuria or polydipsia.    AAA, established problem, stable Has had endovascular repair.  Follows with vascular surgery.  Atrial fibrillation, established problem, stable Currently has pacemaker in place.  On chronic warfarin.  Request INR be checked today.  Has cardiology follow-up. COPD  ROS: Per HPI  PMH: She reports that she quit smoking about 7 years ago. Her smoking use included cigarettes. She has a 27.00 pack-year smoking history. she has never used smokeless tobacco. She reports that she does not drink alcohol or use drugs.  Objective:  Physical Exam: BP 126/78 (BP Location: Left Arm, Patient Position: Sitting, Cuff Size: Normal)   Pulse 78   Temp (!) 97.5 F (36.4 C) (Oral)   Ht 5\' 5"  (1.651 m)   Wt 184 lb 3.2 oz (83.6 kg)   BMI 30.65 kg/m   Gen: NAD, resting comfortably CV: Irregular with no murmurs appreciated Pulm: NWOB, CTAB with no crackles, wheezes, or rhonchi  Assessment/Plan:  No problem-specific Assessment & Plan notes found for this encounter.   Algis Greenhouse. [name], MD 09/28/2017 10:59 AM

## 2017-09-28 NOTE — Progress Notes (Signed)
I have reviewed the patient's encounter and agree with the documentation.  Algis Greenhouse. [name], MD 09/28/2017 12:06 PM

## 2017-09-28 NOTE — Assessment & Plan Note (Signed)
Check A1c today.  Continue glipizide and metformin.  Patient will follow-up with me in 3 months.  Will need preventative diabetes care pending records from her previous PCP.

## 2017-09-28 NOTE — Assessment & Plan Note (Signed)
No symptoms.  Blood pressure at goal.  Will follow up with vascular surgery later this year.

## 2017-10-26 ENCOUNTER — Ambulatory Visit (INDEPENDENT_AMBULATORY_CARE_PROVIDER_SITE_OTHER): Payer: Medicare Other | Admitting: General Practice

## 2017-10-26 DIAGNOSIS — I482 Chronic atrial fibrillation, unspecified: Secondary | ICD-10-CM

## 2017-10-26 DIAGNOSIS — Z7901 Long term (current) use of anticoagulants: Secondary | ICD-10-CM

## 2017-10-26 LAB — POCT INR: INR: 1.9

## 2017-10-26 NOTE — Patient Instructions (Addendum)
Pre visit review using our clinic review tool, if applicable. No additional management support is needed unless otherwise documented below in the visit note.  Take 1 1/2 tablets today (4/10) and then continue to take 1 tablet daily except 1 1/2 tablets on Monday and Friday.  Re-check in 4 weeks.

## 2017-10-27 NOTE — Progress Notes (Signed)
I have reviewed the patient's encounter and agree with the documentation.  Algis Greenhouse. [name], MD 10/27/2017 8:03 AM

## 2017-10-31 ENCOUNTER — Emergency Department (HOSPITAL_COMMUNITY)
Admission: EM | Admit: 2017-10-31 | Discharge: 2017-10-31 | Disposition: A | Discharge status: Home / Self Care | Payer: Medicare Other | Attending: Physician Assistant | Admitting: Physician Assistant

## 2017-10-31 ENCOUNTER — Other Ambulatory Visit: Payer: Self-pay

## 2017-10-31 ENCOUNTER — Encounter (HOSPITAL_COMMUNITY): Payer: Self-pay | Admitting: Emergency Medicine

## 2017-10-31 ENCOUNTER — Emergency Department (HOSPITAL_COMMUNITY): Payer: Medicare Other

## 2017-10-31 DIAGNOSIS — Z87891 Personal history of nicotine dependence: Secondary | ICD-10-CM | POA: Insufficient documentation

## 2017-10-31 DIAGNOSIS — E119 Type 2 diabetes mellitus without complications: Secondary | ICD-10-CM | POA: Insufficient documentation

## 2017-10-31 DIAGNOSIS — J449 Chronic obstructive pulmonary disease, unspecified: Secondary | ICD-10-CM | POA: Insufficient documentation

## 2017-10-31 DIAGNOSIS — Z79899 Other long term (current) drug therapy: Secondary | ICD-10-CM | POA: Insufficient documentation

## 2017-10-31 DIAGNOSIS — W19XXXA Unspecified fall, initial encounter: Secondary | ICD-10-CM

## 2017-10-31 DIAGNOSIS — Z7982 Long term (current) use of aspirin: Secondary | ICD-10-CM | POA: Insufficient documentation

## 2017-10-31 DIAGNOSIS — I129 Hypertensive chronic kidney disease with stage 1 through stage 4 chronic kidney disease, or unspecified chronic kidney disease: Secondary | ICD-10-CM | POA: Insufficient documentation

## 2017-10-31 DIAGNOSIS — Z7984 Long term (current) use of oral hypoglycemic drugs: Secondary | ICD-10-CM | POA: Insufficient documentation

## 2017-10-31 DIAGNOSIS — M25551 Pain in right hip: Secondary | ICD-10-CM | POA: Diagnosis present

## 2017-10-31 DIAGNOSIS — M160 Bilateral primary osteoarthritis of hip: Secondary | ICD-10-CM

## 2017-10-31 DIAGNOSIS — N183 Chronic kidney disease, stage 3 (moderate): Secondary | ICD-10-CM | POA: Insufficient documentation

## 2017-10-31 NOTE — Discharge Instructions (Addendum)
Your x-ray today showed arthritis; no fractures or dislocation seen. Continue your home medications to help with your arthritis. Follow-up with your primary care provider for further evaluation.

## 2017-10-31 NOTE — ED Provider Notes (Signed)
[facility] EMERGENCY DEPARTMENT Provider Note   CSN: [id number] Arrival date & time: 10/31/17  6629     History   Chief Complaint Chief Complaint  Patient presents with  . Hip Pain    HPI Alejandra [name] is a 84 y.o. female with a past medical history of diabetes, hypertension, COPD on 2 L of oxygen, who presents to ED for evaluation of 2-day history of right-sided hip pain radiating down her right leg.  Symptoms began when she was sitting down 2 days ago.  She states that she has had a history of arthritis in multiple joints including her shoulders for which she takes daily Tylenol for.  She denies any prior fracture, dislocations or procedures in the area.  Denies any numbness in legs, injuries and falls, back pain, loss of bowel or bladder function, fevers, changes in gait.  She is ambulatory with a walker per her baseline.  HPI  Past Medical History:  Diagnosis Date  . AAA (abdominal aortic aneurysm) ([place])   . COPD (chronic obstructive pulmonary disease) ([place])   . DM (diabetes mellitus) ([place])   . Dyslipidemia   . Hyperlipidemia   . Hypertension   . Left foot infection 2013  . 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 2013  . Pulmonary embolism [facility])     Patient Active Problem List   Diagnosis Date Noted  . Type 2 diabetes mellitus with stage 3 chronic kidney disease, without long-term current use of insulin ([place]) 09/28/2017  . Syncope 02/02/2017  . Atrial fibrillation, chronic ([place])   . Anticoagulated on Coumadin 10/09/2016  . Chronic respiratory failure with hypoxia ([place]) 10/09/2016  . Neck pain 12/11/2015  . CKD stage 3 secondary to diabetes ([place]) 07/08/2015  . Swelling of limb-Right  Leg 11/30/2013  . Aneurysm of abdominal vessel ([place]) 11/29/2011  . Hypertension associated with diabetes ([place]) 10/29/2011  . Hyperlipidemia associated with type 2 diabetes mellitus ([place]) 10/29/2011  . UNSPECIFIED VITAMIN D DEFICIENCY 09/21/2010  . PE 02/13/2010  . COPD (chronic  obstructive pulmonary disease) ([place]) 02/13/2010  . PULMONARY NODULE 02/13/2010    Past Surgical History:  Procedure Laterality Date  . ABDOMINAL AORTIC ANEURYSM REPAIR  10-30-2011  . CHOLECYSTECTOMY N/A 10/14/2016   Procedure: LAPAROSCOPIC CHOLECYSTECTOMY;  Surgeon: Clovis Riley, MD;  Location: [facility];  Service: General;  Laterality: N/A;  . ENDOVASCULAR STENT INSERTION  10/30/2011   Procedure: ENDOVASCULAR STENT GRAFT INSERTION;  Surgeon: Serafina Mitchell, MD;  Location: [facility];  Service: Vascular;  Laterality: N/A;  . PACEMAKER IMPLANT N/A 02/04/2017   Procedure: Pacemaker Implant;  Surgeon: Constance Haw, MD;  Location: [facility];  Service: Cardiovascular;  Laterality: N/A;  . VESICOVAGINAL FISTULA CLOSURE W/ TAH       OB History   None      Home Medications    Prior to Admission medications   Medication Sig Start Date End Date Taking? Authorizing Provider  acetaminophen (TYLENOL) 500 MG tablet Take 1,000 mg by mouth 2 (two) times daily as needed for mild pain.    [provider]  aspirin 81 MG tablet Take 81 mg by mouth daily.      [provider]  Cholecalciferol (VITAMIN D3) 1000 UNITS CAPS Take 1 capsule by mouth daily.      [provider]  glipiZIDE (GLUCOTROL) 5 MG tablet Take 5 mg by mouth daily.     [provider]  losartan (COZAAR) 25 MG tablet Take 1 tablet (25 mg total)  by mouth daily. 02/06/17   Johnson, Clanford L, MD  lovastatin (MEVACOR) 20 MG tablet Take 1 tablet (20 mg total) by mouth at bedtime. 09/28/17   Vivi Barrack, MD  metFORMIN (GLUCOPHAGE) 500 MG tablet Take 500 mg by mouth 2 (two) times daily with a meal.     [provider]  metoprolol tartrate (LOPRESSOR) 50 MG tablet Take 50 mg by mouth 2 (two) times daily.    [provider]  Multiple Vitamin (MULTIVITAMIN) tablet Take 1 tablet by mouth daily.      [provider]  omega-3 acid ethyl esters (LOVAZA) 1 G capsule Take 2 g  by mouth 2 (two) times daily.    [provider]  triamterene-hydrochlorothiazide (DYAZIDE) 37.5-25 MG capsule Take 1 capsule by mouth daily.    [provider]  warfarin (COUMADIN) 5 MG tablet Take 5 mg by mouth as directed. 5mg  daily except 7.5mg  on Mondays    [provider]    Family History Family History  Problem Relation Age of Onset  . Heart disease Mother   . Hypertension Mother   . Heart attack Mother   . Hypertension Father   . Diabetes Father   . Heart attack Father   . Heart disease Sister        x 3  . Hypertension Sister   . Heart attack Sister   . Clotting disorder Daughter   . Clotting disorder Son        multiple sons  . Diabetes Son   . Hypertension Brother     Social History Social History   Tobacco Use  . Smoking status: Former Smoker    Packs/day: 0.50    Years: 54.00    Pack years: 27.00    Types: Cigarettes    Last attempt to quit: 01/23/2010    Years since quitting: 7.7  . Smokeless tobacco: Never Used  Substance Use Topics  . Alcohol use: No    Alcohol/week: 0.0 oz  . Drug use: No     Allergies   Penicillins   Review of Systems Review of Systems  Constitutional: Negative for appetite change, chills and fever.  HENT: Negative for ear pain, rhinorrhea, sneezing and sore throat.   Eyes: Negative for photophobia and visual disturbance.  Respiratory: Negative for cough, chest tightness, shortness of breath and wheezing.   Cardiovascular: Negative for chest pain and palpitations.  Gastrointestinal: Negative for abdominal pain, blood in stool, constipation, diarrhea, nausea and vomiting.  Genitourinary: Negative for dysuria, hematuria and urgency.  Musculoskeletal: Positive for arthralgias. Negative for myalgias.  Skin: Negative for rash.  Neurological: Negative for dizziness, weakness and light-headedness.     Physical Exam Updated Vital Signs BP 120/87 (BP Location: Right Arm)   Pulse 77   Temp 97.7 F  (36.5 C) (Oral)   Resp 16   Ht 5\' 5"  (1.651 m)   Wt 62.6 kg (138 lb)   SpO2 100%   BMI 22.96 kg/m   Physical Exam  Constitutional: She appears well-developed and well-nourished. No distress.  Nontoxic appearing and in no acute distress,  HENT:  Head: Normocephalic and atraumatic.  Nose: Nose normal.  Eyes: Conjunctivae and EOM are normal. Left eye exhibits no discharge. No scleral icterus.  Neck: Normal range of motion. Neck supple.  Cardiovascular: Normal rate, regular rhythm, normal heart sounds and intact distal pulses. Exam reveals no gallop and no friction rub.  No murmur heard. Pulmonary/Chest: Effort normal and breath sounds normal. No respiratory  distress.  Abdominal: Soft. Bowel sounds are normal. She exhibits no distension. There is no tenderness. There is no guarding.  Musculoskeletal: Normal range of motion. She exhibits tenderness. She exhibits no edema.       Legs: No midline spinal tenderness present in lumbar, thoracic or cervical spine. No step-off palpated. No visible bruising, edema or temperature change noted. No objective signs of numbness present. No saddle anesthesia. 2+ DP pulses bilaterally. Sensation intact to light touch. Strength 5/5 in bilateral lower extremities. No calf tenderness bilaterally.  Neurological: She is alert. She exhibits normal muscle tone. Coordination normal.  Skin: Skin is warm and dry. No rash noted.  Psychiatric: She has a normal mood and affect.  Nursing note and vitals reviewed.    ED Treatments / Results  Labs (all labs ordered are listed, but only abnormal results are displayed) Labs Reviewed - No data to display  EKG None  Radiology Dg Hip Unilat With Pelvis 2-3 Views Right  Result Date: 10/31/2017 CLINICAL DATA:  Right right hip pain radiating down right leg. EXAM: DG HIP (WITH OR WITHOUT PELVIS) 2-3V RIGHT COMPARISON:  None. FINDINGS: Mild symmetric degenerative changes in the hips with early joint space narrowing and  spurring. No acute bony abnormality. Specifically, no fracture, subluxation, or dislocation. Vascular calcifications. Prior aortic stent graft. IMPRESSION: Early degenerative changes in the hips bilaterally. No acute bony abnormality. Electronically Signed   By: Rolm Baptise M.D.   On: 10/31/2017 11:20    Procedures Procedures (including critical care time)  Medications Ordered in ED Medications - No data to display   Initial Impression / Assessment and Plan / ED Course  I have reviewed the triage vital signs and the nursing notes.  Pertinent labs & imaging results that were available during my care of the patient were reviewed by me and considered in my medical decision making (see chart for details).     Patient presents to ED for evaluation of 2-day history of right hip pain radiating down right leg.  Denies any injuries, falls, numbness in legs, fevers, back pain.  No history of prior fracture, dislocations or procedures in the area.  She has remained ambulatory with her walker as she usually does.  She takes Tylenol and NSAIDs for her arthritis in multiple joints.  X-ray of the hip shows degenerative changes in bilateral hips.  She is neurovascularly intact with no calf tenderness, lower extremity edema or erythema noted. Doubt infectious or vascular cause of symptoms. Suspect that symptoms are due to arthritis based on x-ray findings.  Will advise patient to continue her home medications for arthritis.  Advised to follow-up with PCP for further evaluation if symptoms persist.  Advised to return for any worsening or severe symptoms. Patient discussed with and seen by my attending, Dr. Thomasene Lot.  Portions of this note were generated with [employer]. Dictation errors may occur despite best attempts at proofreading.   Final Clinical Impressions(s) / ED Diagnoses   Final diagnoses:  Osteoarthritis of both hips, unspecified osteoarthritis type    ED Discharge Orders     None       Delia Heady, PA-C 10/31/17 1412    [place], Fredia Sorrow, MD 10/31/17 1603

## 2017-10-31 NOTE — ED Notes (Signed)
Pt verbalized understanding of discharge instructions.

## 2017-10-31 NOTE — ED Triage Notes (Signed)
Pt arrives with daughter from pts home where she lives alone. Pt c/o of right hip pain down her right leg into calf. Pt is able to walk with walker with pain.

## 2017-11-02 NOTE — Progress Notes (Signed)
Subjective:   Alejandra [name] is a 84 y.o. female who presents for Medicare Annual (Subsequent) preventive examination.  Reports health as   MMSE 22/30  BMI 23   Exercise; walks slowly with back curvature but "get up and go" is excellent; states she stays in her  chair all the time; discussed exercise in the home      SAFETY/  Dtr is living with her Alejandra [name]) Has 2 children lives in town; brother in town  Cooks once a week; every week cooks something different;   Safety reviewed; uses a walker now  one  level home; discussed  eliminating clutter, bedroom near bathroom; does have a nightlight; TV light; recommended carrying cell phone to bathroom at hs; bathroom safety reviewed; community safety; smoke detectors and firearms safety reviewed;  Driving accidents no / stopped driving in July 2353  Here today to see Dr. [name] for pain   There are no preventive care reminders to display for this patient.I MM declined- hold for now     Objective:     Vitals: BP 106/78   Pulse 78   Ht 5\' 5"  (1.651 m)   Wt 138 lb (62.6 kg)   BMI 22.96 kg/m   Body mass index is 22.96 kg/m.  Advanced Directives 02/03/2017 02/02/2017 10/09/2016 10/09/2016 01/12/2016 07/01/2015 11/20/2014  Does Patient Have a Medical Advance Directive? No No Yes Yes Yes Yes Yes  Type of Advance Directive - Public librarian;Living will [place];Living will Living will - [place];Living will  Does patient want to make changes to medical advance directive? - - No - Patient declined - No - Patient declined - No - Patient declined  Copy of [place] in Chart? - - No - copy requested - - - No - copy requested  Would patient like information on creating a medical advance directive? No - Patient declined - - - - - -  Pre-existing out of facility DNR order (yellow form or pink MOST form) - - - - - - -    Tobacco Social History   Tobacco Use  Smoking  Status Former Smoker  . Packs/day: 0.50  . Years: 54.00  . Pack years: 27.00  . Types: Cigarettes  . Last attempt to quit: 01/23/2010  . Years since quitting: 7.7  Smokeless Tobacco Never Used     Counseling given: Not Answered   Clinical Intake:  Past Medical History:  Diagnosis Date  . AAA (abdominal aortic aneurysm) ([place])   . Arthritis   . COPD (chronic obstructive pulmonary disease) ([place])   . DM (diabetes mellitus) ([place])   . Dyslipidemia   . Hyperlipidemia   . Hypertension   . Left foot infection 2013  . 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 2013  . Pulmonary embolism [facility])    Past Surgical History:  Procedure Laterality Date  . ABDOMINAL AORTIC ANEURYSM REPAIR  10-30-2011  . CHOLECYSTECTOMY N/A 10/14/2016   Procedure: LAPAROSCOPIC CHOLECYSTECTOMY;  Surgeon: Clovis Riley, MD;  Location: [place];  Service: General;  Laterality: N/A;  . ENDOVASCULAR STENT INSERTION  10/30/2011   Procedure: ENDOVASCULAR STENT GRAFT INSERTION;  Surgeon: Serafina Mitchell, MD;  Location: [employer];  Service: Vascular;  Laterality: N/A;  . PACEMAKER IMPLANT N/A 02/04/2017   Procedure: Pacemaker Implant;  Surgeon: Constance Haw, MD;  Location: [facility];  Service: Cardiovascular;  Laterality: N/A;  . VESICOVAGINAL FISTULA CLOSURE W/ TAH     Family  History  Problem Relation Age of Onset  . Heart disease Mother   . Hypertension Mother   . Heart attack Mother   . Hypertension Father   . Diabetes Father   . Heart attack Father   . Heart disease Sister        x 3  . Hypertension Sister   . Heart attack Sister   . Clotting disorder Daughter   . Clotting disorder Son        multiple sons  . Diabetes Son   . Hypertension Brother    Social History   Socioeconomic History  . Marital status: Widowed    Spouse name: Not on file  . Number of children: 2  . Years of education: 55  . Highest education level: Not on file  Occupational History  . Occupation: retired    Comment: Building surveyor    Social Needs  . Financial resource strain: Not on file  . Food insecurity:    Worry: Not on file    Inability: Not on file  . Transportation needs:    Medical: Not on file    Non-medical: Not on file  Tobacco Use  . Smoking status: Former Smoker    Packs/day: 0.50    Years: 54.00    Pack years: 27.00    Types: Cigarettes    Last attempt to quit: 01/23/2010    Years since quitting: 7.7  . Smokeless tobacco: Never Used  Substance and Sexual Activity  . Alcohol use: No    Alcohol/week: 0.0 oz  . Drug use: No  . Sexual activity: Not on file  Lifestyle  . Physical activity:    Days per week: Not on file    Minutes per session: Not on file  . Stress: Not on file  Relationships  . Social connections:    Talks on phone: Not on file    Gets together: Not on file    Attends religious service: Not on file    Active member of club or organization: Not on file    Attends meetings of clubs or organizations: Not on file    Relationship status: Not on file  Other Topics Concern  . Not on file  Social History Narrative   Fun: Goes to church, Watch TV   Denies religious beliefs effecting health care.     Outpatient Encounter Medications as of 11/03/2017  Medication Sig  . acetaminophen (TYLENOL) 500 MG tablet Take 1,000 mg by mouth 2 (two) times daily as needed for mild pain.  Marland Kitchen aspirin 81 MG tablet Take 81 mg by mouth daily.    . Cholecalciferol (VITAMIN D3) 1000 UNITS CAPS Take 1 capsule by mouth daily.    . Garlic 3329 MG CAPS Take 1,000 mg by mouth.  Marland Kitchen glipiZIDE (GLUCOTROL) 5 MG tablet Take 5 mg by mouth daily.   Marland Kitchen losartan (COZAAR) 25 MG tablet Take 1 tablet (25 mg total) by mouth daily.  Marland Kitchen lovastatin (MEVACOR) 20 MG tablet Take 1 tablet (20 mg total) by mouth at bedtime.  . metFORMIN (GLUCOPHAGE) 500 MG tablet Take 500 mg by mouth 2 (two) times daily with a meal.   . metoprolol tartrate (LOPRESSOR) 50 MG tablet Take 50 mg by mouth 2 (two) times daily.  . Multiple Vitamin  (MULTIVITAMIN) tablet Take 1 tablet by mouth daily.    Marland Kitchen omega-3 acid ethyl esters (LOVAZA) 1 G capsule Take 2 g by mouth 2 (two) times daily.  . traMADol (ULTRAM) 50 MG tablet  Take 0.5 tablets (25 mg total) by mouth every 8 (eight) hours as needed for severe pain.  Marland Kitchen triamterene-hydrochlorothiazide (DYAZIDE) 37.5-25 MG capsule Take 1 capsule by mouth daily.  Marland Kitchen warfarin (COUMADIN) 5 MG tablet Take 5 mg by mouth as directed. 5mg  daily except 7.5mg  on Mondays  . [EXPIRED] methylPREDNISolone acetate (DEPO-MEDROL) injection 80 mg    No facility-administered encounter medications on file as of 11/03/2017.     Activities of Daily Living In your present state of health, do you have any difficulty performing the following activities: 11/03/2017 02/03/2017  Hearing? N N  Vision? N N  Difficulty concentrating or making decisions? - N  Walking or climbing stairs? (No Data) Y  Comment walking some -  Dressing or bathing? N N  Doing errands, shopping? N Y  Conservation officer, nature and eating ? N -  Using the Toilet? N -  In the past six months, have you accidently leaked urine? N -  Do you have problems with loss of bowel control? N -  Managing your Medications? N -  Managing your Finances? N -  Some recent data might be hidden    Patient Care Team: Vivi Barrack, MD as PCP - General (Family Medicine) Brand Males, MD as Consulting Physician (Pulmonary Disease)    Assessment:   This is a routine wellness examination for Alejandra [name].  Exercise Activities and Dietary recommendations    Goals    . Exercise 150 min/wk Moderate Activity     Exercise every day even if just getting up and down Keep moving    . patient  (pt-stated)     Walks around some; walk around the block now every day  Doesn't walk when it is cold        Fall Risk Fall Risk  11/03/2017 11/03/2017 09/28/2017 07/01/2015  Falls in the past year? No No No No    Depression Screen PHQ 2/9 Scores 11/03/2017 11/03/2017 09/28/2017  07/01/2015  PHQ - 2 Score 0 0 0 0     Cognitive Function MMSE - Mini Mental State Exam 11/03/2017 07/01/2015  Not completed: Refused -  Orientation to time - 4  Orientation to Place - 5  Registration - 3  Attention/ Calculation - 0  Recall - 3  Language- name 2 objects - 2  Language- repeat - 1  Language- follow 3 step command - 2  Language- read & follow direction - 1  Write a sentence - 1  Copy design - 0  Total score - 22    to note; able to answer assessment questions. Did not do MMSE as she just had injection from her MD for pain Dtr states she does manage her meds Recall 2/3 today, oriented except to date Remember my name of 2 years ago     Immunization History  Administered Date(s) Administered  . Pneumococcal Conjugate-13 07/07/2015     Screening Tests Health Maintenance  Topic Date Due  . TETANUS/TDAP  11/04/2018 (Originally 02/04/1951)  . PNA vac Low Risk Adult (2 of 2 - PPSV23) 11/04/2018 (Originally 07/06/2016)  . INFLUENZA VACCINE  02/16/2018  . OPHTHALMOLOGY EXAM  07/26/2018  . FOOT EXAM  11/04/2018  . DEXA SCAN  Completed         Plan:      PCP Notes   Health Maintenance Completed foot exam, thick toenails but declined to go to a podiatrist Eye exam in January around the 8th, confirmed by dtr Prefers to take the tdap at the grocery  store where it is cheaper Deferred psv 23 as she felt she had one sometime after 67   Abnormal Screens  none Referrals  None;   Patient concerns; MD addressed pain today Remembered my assistance with spriva which was ordered for her congestion 06/2015 She did get patient assistance and was given the form for apply again for Spriva 180 mcg inhalation if Dr. [name] feels it would be of benefit. She stopped this medication because she could not afford it. She will complete the paper work and I will fax to company if dr. [name] agrees this will be helpful for COPD   Nurse Concerns; Mental status seems  better oriented than 2016; oriented today except for date Dtr lives with her and oversees care Dtr states she cooks and stays busy, manages her meds Has stopped driving per her dtr last July   Next PCP apt 6/19       I have personally reviewed and noted the following in the patient's chart:   . Medical and social history . Use of alcohol, tobacco or illicit drugs  . Current medications and supplements . Functional ability and status . Nutritional status . Physical activity . Advanced directives . List of other physicians . Hospitalizations, surgeries, and ER visits in previous 12 months . Vitals . Screenings to include cognitive, depression, and falls . Referrals and appointments  In addition, I have reviewed and discussed with patient certain preventive protocols, quality metrics, and best practice recommendations. A written personalized care plan for preventive services as well as general preventive health recommendations were provided to patient.     Wynetta Fines, RN  11/03/2017

## 2017-11-03 ENCOUNTER — Ambulatory Visit (INDEPENDENT_AMBULATORY_CARE_PROVIDER_SITE_OTHER): Payer: Medicare Other | Admitting: *Deleted

## 2017-11-03 ENCOUNTER — Encounter: Payer: Self-pay | Admitting: *Deleted

## 2017-11-03 ENCOUNTER — Encounter: Payer: Self-pay | Admitting: Family Medicine

## 2017-11-03 ENCOUNTER — Ambulatory Visit (INDEPENDENT_AMBULATORY_CARE_PROVIDER_SITE_OTHER): Payer: Medicare Other | Admitting: Family Medicine

## 2017-11-03 ENCOUNTER — Telehealth: Payer: Self-pay

## 2017-11-03 VITALS — BP 106/78 | HR 78 | Ht 65.0 in | Wt 138.0 lb

## 2017-11-03 VITALS — BP 106/78 | HR 78 | Temp 97.9°F | Ht 65.0 in | Wt 138.0 lb

## 2017-11-03 DIAGNOSIS — Z0000 Encounter for general adult medical examination without abnormal findings: Secondary | ICD-10-CM

## 2017-11-03 DIAGNOSIS — M1611 Unilateral primary osteoarthritis, right hip: Secondary | ICD-10-CM | POA: Diagnosis not present

## 2017-11-03 DIAGNOSIS — M1711 Unilateral primary osteoarthritis, right knee: Secondary | ICD-10-CM | POA: Diagnosis not present

## 2017-11-03 DIAGNOSIS — I10 Essential (primary) hypertension: Secondary | ICD-10-CM | POA: Diagnosis not present

## 2017-11-03 DIAGNOSIS — E1159 Type 2 diabetes mellitus with other circulatory complications: Secondary | ICD-10-CM | POA: Diagnosis not present

## 2017-11-03 DIAGNOSIS — M1990 Unspecified osteoarthritis, unspecified site: Secondary | ICD-10-CM | POA: Insufficient documentation

## 2017-11-03 DIAGNOSIS — I152 Hypertension secondary to endocrine disorders: Secondary | ICD-10-CM

## 2017-11-03 MED ORDER — TRAMADOL HCL 50 MG PO TABS: 25.0000 mg | ORAL_TABLET | Freq: Three times a day (TID) | ORAL | 0 refills | Status: DC | PRN

## 2017-11-03 MED ORDER — METHYLPREDNISOLONE ACETATE 80 MG/ML IJ SUSP
80.0000 mg | Freq: Once | INTRAMUSCULAR | Status: AC
  Administered 2017-11-03: 80 mg via INTRAMUSCULAR

## 2017-11-03 NOTE — Progress Notes (Signed)
dep 

## 2017-11-03 NOTE — Progress Notes (Signed)
I have personally reviewed the Medicare Annual Wellness Visit and agree with the assessment and plan.  Alejandra [name]. [name], MD 11/03/2017 12:49 PM

## 2017-11-03 NOTE — Patient Instructions (Addendum)
Please use the tramadol as needed for severe pain.   I would like for you to see Dr Paulla Fore soon for further management of your hip pain.   I will see you back in 2 months to discuss your blood sugars and blood pressure.  Come back to see me sooner as needed.  Take care, Dr [name]

## 2017-11-03 NOTE — Patient Instructions (Addendum)
[name] , Thank you for taking time to come for your Medicare Wellness Visit. I appreciate your ongoing commitment to your health goals. Please review the following plan we discussed and let me know if I can assist you in the future.   You will need a tetanus if you get a dirty wound It is 50.00 on average in the drug stores.  You will need to take the Tdap or the one with whooping cough in it.  Eye exam in January   Return visit in June Will be back to See Alejandra [name] and Dr. Paulla Fore, Please bring the form back for Alejandra [name] to sign if he feels you would benefit from spriva.   These are the goals we discussed: Goals    . patient  (pt-stated)     Walks around some; walk around the block now every day  Doesn't walk when it is cold        This is a list of the screening recommended for you and due dates:  Health Maintenance  Topic Date Due  . Complete foot exam   02/05/1942  . Eye exam for diabetics  02/05/1942  . Tetanus Vaccine  02/04/1953  . Pneumonia vaccines (2 of 2 - PPSV23) 11/04/2018*  . Flu Shot  02/16/2018  . DEXA scan (bone density measurement)  Completed  *Topic was postponed. The date shown is not the original due date.      Fall Prevention in the Home Falls can cause injuries. They can happen to people of all ages. There are many things you can do to make your home safe and to help prevent falls. What can I do on the outside of my home?  Regularly fix the edges of walkways and driveways and fix any cracks.  Remove anything that might make you trip as you walk through a door, such as a raised step or threshold.  Trim any bushes or trees on the path to your home.  Use bright outdoor lighting.  Clear any walking paths of anything that might make someone trip, such as rocks or tools.  Regularly check to see if handrails are loose or broken. Make sure that both sides of any steps have handrails.  Any raised decks and porches should have guardrails on the  edges.  Have any leaves, snow, or ice cleared regularly.  Use sand or salt on walking paths during winter.  Clean up any spills in your garage right away. This includes oil or grease spills. What can I do in the bathroom?  Use night lights.  Install grab bars by the toilet and in the tub and shower. Do not use towel bars as grab bars.  Use non-skid mats or decals in the tub or shower.  If you need to sit down in the shower, use a plastic, non-slip stool.  Keep the floor dry. Clean up any water that spills on the floor as soon as it happens.  Remove soap buildup in the tub or shower regularly.  Attach bath mats securely with double-sided non-slip rug tape.  Do not have throw rugs and other things on the floor that can make you trip. What can I do in the bedroom?  Use night lights.  Make sure that you have a light by your bed that is easy to reach.  Do not use any sheets or blankets that are too big for your bed. They should not hang down onto the floor.  Have a firm chair that has  side arms. You can use this for support while you get dressed.  Do not have throw rugs and other things on the floor that can make you trip. What can I do in the kitchen?  Clean up any spills right away.  Avoid walking on wet floors.  Keep items that you use a lot in easy-to-reach places.  If you need to reach something above you, use a strong step stool that has a grab bar.  Keep electrical cords out of the way.  Do not use floor polish or wax that makes floors slippery. If you must use wax, use non-skid floor wax.  Do not have throw rugs and other things on the floor that can make you trip. What can I do with my stairs?  Do not leave any items on the stairs.  Make sure that there are handrails on both sides of the stairs and use them. Fix handrails that are broken or loose. Make sure that handrails are as long as the stairways.  Check any carpeting to make sure that it is firmly  attached to the stairs. Fix any carpet that is loose or worn.  Avoid having throw rugs at the top or bottom of the stairs. If you do have throw rugs, attach them to the floor with carpet tape.  Make sure that you have a light switch at the top of the stairs and the bottom of the stairs. If you do not have them, ask someone to add them for you. What else can I do to help prevent falls?  Wear shoes that: ? Do not have high heels. ? Have rubber bottoms. ? Are comfortable and fit you well. ? Are closed at the toe. Do not wear sandals.  If you use a stepladder: ? Make sure that it is fully opened. Do not climb a closed stepladder. ? Make sure that both sides of the stepladder are locked into place. ? Ask someone to hold it for you, if possible.  Clearly mark and make sure that you can see: ? Any grab bars or handrails. ? First and last steps. ? Where the edge of each step is.  Use tools that help you move around (mobility aids) if they are needed. These include: ? Canes. ? Walkers. ? Scooters. ? Crutches.  Turn on the lights when you go into a dark area. Replace any light bulbs as soon as they burn out.  Set up your furniture so you have a clear path. Avoid moving your furniture around.  If any of your floors are uneven, fix them.  If there are any pets around you, be aware of where they are.  Review your medicines with your doctor. Some medicines can make you feel dizzy. This can increase your chance of falling. Ask your doctor what other things that you can do to help prevent falls. This information is not intended to replace advice given to you by your health care provider. Make sure you discuss any questions you have with your health care provider. Document Released: 05/01/2009 Document Revised: 12/11/2015 Document Reviewed: 08/09/2014 Elsevier Interactive Patient Education  2018 [place] Maintenance, Female Adopting a healthy lifestyle and getting preventive  care can go a long way to promote health and wellness. Talk with your health care provider about what schedule of regular examinations is right for you. This is a good chance for you to check in with your provider about disease prevention and staying healthy. In between checkups, there  are plenty of things you can do on your own. Experts have done a lot of research about which lifestyle changes and preventive measures are most likely to keep you healthy. Ask your health care provider for more information. Weight and diet Eat a healthy diet  Be sure to include plenty of vegetables, fruits, low-fat dairy products, and lean protein.  Do not eat a lot of foods high in solid fats, added sugars, or salt.  Get regular exercise. This is one of the most important things you can do for your health. ? Most adults should exercise for at least 150 minutes each week. The exercise should increase your heart rate and make you sweat (moderate-intensity exercise). ? Most adults should also do strengthening exercises at least twice a week. This is in addition to the moderate-intensity exercise.  Maintain a healthy weight  Body mass index (BMI) is a measurement that can be used to identify possible weight problems. It estimates body fat based on height and weight. Your health care provider can help determine your BMI and help you achieve or maintain a healthy weight.  For females 14 years of age and older: ? A BMI below 18.5 is considered underweight. ? A BMI of 18.5 to 24.9 is normal. ? A BMI of 25 to 29.9 is considered overweight. ? A BMI of 30 and above is considered obese.  Watch levels of cholesterol and blood lipids  You should start having your blood tested for lipids and cholesterol at 82 years of age, then have this test every 5 years.  You may need to have your cholesterol levels checked more often if: ? Your lipid or cholesterol levels are high. ? You are older than 82 years of age. ? You are at  high risk for heart disease.  Cancer screening Lung Cancer  Lung cancer screening is recommended for adults 39-38 years old who are at high risk for lung cancer because of a history of smoking.  A yearly low-dose CT scan of the lungs is recommended for people who: ? Currently smoke. ? Have quit within the past 15 years. ? Have at least a 30-pack-year history of smoking. A pack year is smoking an average of one pack of cigarettes a day for 1 year.  Yearly screening should continue until it has been 15 years since you quit.  Yearly screening should stop if you develop a health problem that would prevent you from having lung cancer treatment.  Breast Cancer  Practice breast self-awareness. This means understanding how your breasts normally appear and feel.  It also means doing regular breast self-exams. Let your health care provider know about any changes, no matter how small.  If you are in your 20s or 30s, you should have a clinical breast exam (CBE) by a health care provider every 1-3 years as part of a regular health exam.  If you are 40 or older, have a CBE every year. Also consider having a breast X-ray (mammogram) every year.  If you have a family history of breast cancer, talk to your health care provider about genetic screening.  If you are at high risk for breast cancer, talk to your health care provider about having an MRI and a mammogram every year.  Breast cancer gene (BRCA) assessment is recommended for women who have family members with BRCA-related cancers. BRCA-related cancers include: ? Breast. ? Ovarian. ? Tubal. ? Peritoneal cancers.  Results of the assessment will determine the need for genetic counseling and  BRCA1 and BRCA2 testing.  Cervical Cancer Your health care provider may recommend that you be screened regularly for cancer of the pelvic organs (ovaries, uterus, and vagina). This screening involves a pelvic examination, including checking for  microscopic changes to the surface of your cervix (Pap test). You may be encouraged to have this screening done every 3 years, beginning at age 71.  For women ages 66-65, health care providers may recommend pelvic exams and Pap testing every 3 years, or they may recommend the Pap and pelvic exam, combined with testing for human papilloma virus (HPV), every 5 years. Some types of HPV increase your risk of cervical cancer. Testing for HPV may also be done on women of any age with unclear Pap test results.  Other health care providers may not recommend any screening for nonpregnant women who are considered low risk for pelvic cancer and who do not have symptoms. Ask your health care provider if a screening pelvic exam is right for you.  If you have had past treatment for cervical cancer or a condition that could lead to cancer, you need Pap tests and screening for cancer for at least 20 years after your treatment. If Pap tests have been discontinued, your risk factors (such as having a new sexual partner) need to be reassessed to determine if screening should resume. Some women have medical problems that increase the chance of getting cervical cancer. In these cases, your health care provider may recommend more frequent screening and Pap tests.  Colorectal Cancer  This type of cancer can be detected and often prevented.  Routine colorectal cancer screening usually begins at 82 years of age and continues through 82 years of age.  Your health care provider may recommend screening at an earlier age if you have risk factors for colon cancer.  Your health care provider may also recommend using home test kits to check for hidden blood in the stool.  A small camera at the end of a tube can be used to examine your colon directly (sigmoidoscopy or colonoscopy). This is done to check for the earliest forms of colorectal cancer.  Routine screening usually begins at age 31.  Direct examination of the colon  should be repeated every 5-10 years through 82 years of age. However, you may need to be screened more often if early forms of precancerous polyps or small growths are found.  Skin Cancer  Check your skin from head to toe regularly.  Tell your health care provider about any new moles or changes in moles, especially if there is a change in a mole's shape or color.  Also tell your health care provider if you have a mole that is larger than the size of a pencil eraser.  Always use sunscreen. Apply sunscreen liberally and repeatedly throughout the day.  Protect yourself by wearing long sleeves, pants, a wide-brimmed hat, and sunglasses whenever you are outside.  Heart disease, diabetes, and high blood pressure  High blood pressure causes heart disease and increases the risk of stroke. High blood pressure is more likely to develop in: ? People who have blood pressure in the high end of the normal range (130-139/85-89 mm Hg). ? People who are overweight or obese. ? People who are African American.  If you are 98-37 years of age, have your blood pressure checked every 3-5 years. If you are 53 years of age or older, have your blood pressure checked every year. You should have your blood pressure measured twice-once when  you are at a hospital or clinic, and once when you are not at a hospital or clinic. Record the average of the two measurements. To check your blood pressure when you are not at a hospital or clinic, you can use: ? An automated blood pressure machine at a pharmacy. ? A home blood pressure monitor.  If you are between 68 years and 63 years old, ask your health care provider if you should take aspirin to prevent strokes.  Have regular diabetes screenings. This involves taking a blood sample to check your fasting blood sugar level. ? If you are at a normal weight and have a low risk for diabetes, have this test once every three years after 81 years of age. ? If you are overweight and  have a high risk for diabetes, consider being tested at a younger age or more often. Preventing infection Hepatitis B  If you have a higher risk for hepatitis B, you should be screened for this virus. You are considered at high risk for hepatitis B if: ? You were born in a country where hepatitis B is common. Ask your health care provider which countries are considered high risk. ? Your parents were born in a high-risk country, and you have not been immunized against hepatitis B (hepatitis B vaccine). ? You have HIV or AIDS. ? You use needles to inject street drugs. ? You live with someone who has hepatitis B. ? You have had sex with someone who has hepatitis B. ? You get hemodialysis treatment. ? You take certain medicines for conditions, including cancer, organ transplantation, and autoimmune conditions.  Hepatitis C  Blood testing is recommended for: ? Everyone born from [birth year] through 1963. ? Anyone with known risk factors for hepatitis C.  Sexually transmitted infections (STIs)  You should be screened for sexually transmitted infections (STIs) including gonorrhea and chlamydia if: ? You are sexually active and are younger than 82 years of age. ? You are older than 82 years of age and your health care provider tells you that you are at risk for this type of infection. ? Your sexual activity has changed since you were last screened and you are at an increased risk for chlamydia or gonorrhea. Ask your health care provider if you are at risk.  If you do not have HIV, but are at risk, it may be recommended that you take a prescription medicine daily to prevent HIV infection. This is called pre-exposure prophylaxis (PrEP). You are considered at risk if: ? You are sexually active and do not regularly use condoms or know the HIV status of your partner(s). ? You take drugs by injection. ? You are sexually active with a partner who has HIV.  Talk with your health care provider about whether  you are at high risk of being infected with HIV. If you choose to begin PrEP, you should first be tested for HIV. You should then be tested every 3 months for as long as you are taking PrEP. Pregnancy  If you are premenopausal and you may become pregnant, ask your health care provider about preconception counseling.  If you may become pregnant, take 400 to 800 micrograms (mcg) of folic acid every day.  If you want to prevent pregnancy, talk to your health care provider about birth control (contraception). Osteoporosis and menopause  Osteoporosis is a disease in which the bones lose minerals and strength with aging. This can result in serious bone fractures. Your risk for osteoporosis can be  identified using a bone density scan.  If you are 19 years of age or older, or if you are at risk for osteoporosis and fractures, ask your health care provider if you should be screened.  Ask your health care provider whether you should take a calcium or vitamin D supplement to lower your risk for osteoporosis.  Menopause may have certain physical symptoms and risks.  Hormone replacement therapy may reduce some of these symptoms and risks. Talk to your health care provider about whether hormone replacement therapy is right for you. Follow these instructions at home:  Schedule regular health, dental, and eye exams.  Stay current with your immunizations.  Do not use any tobacco products including cigarettes, chewing tobacco, or electronic cigarettes.  If you are pregnant, do not drink alcohol.  If you are breastfeeding, limit how much and how often you drink alcohol.  Limit alcohol intake to no more than 1 drink per day for nonpregnant women. One drink equals 12 ounces of beer, 5 ounces of wine, or 1 ounces of hard liquor.  Do not use street drugs.  Do not share needles.  Ask your health care provider for help if you need support or information about quitting drugs.  Tell your health care  provider if you often feel depressed.  Tell your health care provider if you have ever been abused or do not feel safe at home. This information is not intended to replace advice given to you by your health care provider. Make sure you discuss any questions you have with your health care provider. Document Released: 01/18/2011 Document Revised: 12/11/2015 Document Reviewed: 04/08/2015 Elsevier Interactive Patient Education  [employer].

## 2017-11-03 NOTE — Assessment & Plan Note (Addendum)
She does have hip degenerative disease based on her x-ray in the emergency department, however also likely has some lumbar spine degenerative changes as well.  She does not have any red flag signs or symptoms today.  Treatment options are limited given her CKD and chronic anticoagulation on warfarin.  We will give 80 mg IM Depo-Medrol today.  We will also give small supply of tramadol.  I will place a referral to sports medicine for further evaluation and management.

## 2017-11-03 NOTE — Assessment & Plan Note (Signed)
Blood work today, however at goal.  Continue losartan, metoprolol, and triamterene-HCTZ.  If continues to be low, would consider stopping triamterene-HCTZ.

## 2017-11-03 NOTE — Progress Notes (Signed)
    Subjective:  Alejandra [name] is a 84 y.o. female who presents today with a chief complaint of hip pain.   HPI:  Hip pain, new problem Pain started a few days ago.  She was seen in the emergency department 3 days ago.  During that visit she had a x-ray obtained which was consistent with hip osteoarthritis.  Patient was discharged home with instruction to use over-the-counter analgesics as needed.  Since her ED visit, her pain has been stable.  She uses Tylenol every 4 hours which helps a little bit.  No fevers or chills.  No weakness or numbness.  No obvious precipitating events.  She has arthritis in several other sites including knees, neck, and back. Patient is not interested in orthopedic surgery.   Hypertension, established problem, stable Patient is currently on losartan 25 mg daily, metoprolol 50 mg twice daily, and Dyazide 37.5-25 daily. Tolerates well without side effects.   ROS: Per HPI  PMH: She reports that she quit smoking about 7 years ago. Her smoking use included cigarettes. She has a 27.00 pack-year smoking history. She has never used smokeless tobacco. She reports that she does not drink alcohol or use drugs.   Objective:  Physical Exam: BP 106/78 (BP Location: Left Arm)   Pulse 78   Temp 97.9 F (36.6 C) (Oral)   Ht 5\' 5"  (1.651 m)   Wt 138 lb (62.6 kg)   BMI 22.96 kg/m   Gen: NAD, resting comfortably CV: RRR with no murmurs appreciated Pulm: NWOB, CTAB with no crackles, wheezes, or rhonchi MSK: Limited internal and external rotation of right hip.  Pain worsened with rotation of hip.  Neurovascular intact distally.  Assessment/Plan:  Osteoarthritis She does have hip degenerative disease based on her x-ray in the emergency department, however also likely has some lumbar spine degenerative changes as well.  She does not have any red flag signs or symptoms today.  Treatment options are limited given her CKD and chronic anticoagulation on warfarin.  We will  give 80 mg IM Depo-Medrol today.  We will also give small supply of tramadol.  I will place a referral to sports medicine for further evaluation and management.  Hypertension associated with diabetes ([place]) Blood work today, however at goal.  Continue losartan, metoprolol, and triamterene-HCTZ.  If continues to be low, would consider stopping triamterene-HCTZ.  Algis Greenhouse. [name], MD 11/03/2017 10:51 AM

## 2017-11-03 NOTE — Telephone Encounter (Signed)
04/18 Patient in for AWV  Remembered my assistance with spriva which was ordered for her congestion/ COPD management when I seen her ind 06/2015  She did get patient assistance and was given the form to  apply again for Spriva 180 mcg inhalation if Dr. [name] feels it would be of benefit.  She will complete the form and bring it back to the office for Dr. [name] to review. Her next apt is 6/19 but I can fax the form in to the pharmaceutical on the next Thursday that I am back   Carita Pian

## 2017-11-04 ENCOUNTER — Encounter: Payer: Self-pay | Admitting: Urgent Care

## 2017-11-04 ENCOUNTER — Ambulatory Visit: Payer: Self-pay

## 2017-11-04 ENCOUNTER — Ambulatory Visit: Payer: Medicare Other | Admitting: Urgent Care

## 2017-11-04 VITALS — BP 112/68 | HR 91 | Temp 98.2°F | Resp 17

## 2017-11-04 DIAGNOSIS — M5410 Radiculopathy, site unspecified: Secondary | ICD-10-CM | POA: Diagnosis not present

## 2017-11-04 DIAGNOSIS — M1711 Unilateral primary osteoarthritis, right knee: Secondary | ICD-10-CM

## 2017-11-04 DIAGNOSIS — M1611 Unilateral primary osteoarthritis, right hip: Secondary | ICD-10-CM

## 2017-11-04 MED ORDER — ACETAMINOPHEN-CODEINE #3 300-30 MG PO TABS: 1.0000 | ORAL_TABLET | Freq: Three times a day (TID) | ORAL | 0 refills | Status: DC | PRN

## 2017-11-04 NOTE — Progress Notes (Signed)
   MRN: [id number] DOB: Aug 04, 1931  Subjective:   Alejandra [name] is a 84 y.o. female presenting for persistent and severe sharp right-sided low back pain, hip pain that radiates into her right leg all the way to her foot.  Patient was seen in the ER and shown to have some arthritis of her hip.  She was also seen by her PCP Dr. [name] and received an IM injection of Depo-Medrol 80 mg. Patient was also given prescription for tramadol for her severe pain.  Today, patient reports that she is had no improvement.  She does have an appointment scheduled with Dr. Paulla Fore on May 8 for further management.  Alejandra [name] has a current medication list which includes the following prescription(s): acetaminophen, aspirin, vitamin d3, garlic, glipizide, losartan, lovastatin, metformin, metoprolol tartrate, multivitamin, omega-3 acid ethyl esters, tramadol, triamterene-hydrochlorothiazide, and warfarin. Also is allergic to penicillins.  Alejandra [name]  has a past medical history of AAA (abdominal aortic aneurysm) ([place]), Arthritis, COPD (chronic obstructive pulmonary disease) ([place]), DM (diabetes mellitus) ([place]), Dyslipidemia, Hyperlipidemia, Hypertension, Left foot infection (2013), O2 dependent (Nov. 2013), and Pulmonary embolism ([place]). Also  has a past surgical history that includes Vesicovaginal fistula closure w/ TAH; Endovascular stent insertion (10/30/2011); Abdominal aortic aneurysm repair (10-30-2011); Cholecystectomy (N/A, 10/14/2016); and PACEMAKER IMPLANT (N/A, 02/04/2017).  Objective:   Vitals: BP 112/68   Pulse 91   Temp 98.2 F (36.8 C) (Oral)   Resp 17   SpO2 98%   Physical Exam  Constitutional: She is oriented to person, place, and time. She appears well-developed and well-nourished.  Cardiovascular: Normal rate.  Pulmonary/Chest: Effort normal.  Neurological: She is alert and oriented to person, place, and time.    Dg Hip Unilat With Pelvis 2-3 Views Right  Result Date: 10/31/2017 CLINICAL DATA:  Right right  hip pain radiating down right leg. EXAM: DG HIP (WITH OR WITHOUT PELVIS) 2-3V RIGHT COMPARISON:  None. FINDINGS: Mild symmetric degenerative changes in the hips with early joint space narrowing and spurring. No acute bony abnormality. Specifically, no fracture, subluxation, or dislocation. Vascular calcifications. Prior aortic stent graft. IMPRESSION: Early degenerative changes in the hips bilaterally. No acute bony abnormality. Electronically Signed   By: Rolm Baptise M.D.   On: 10/31/2017 11:20   Assessment and Plan :   Primary osteoarthritis of right hip  Primary osteoarthritis of right knee  Radicular pain of right lower extremity  Patient received most aggressive intervention that I could give an IM Depo-Medrol yesterday with her PCP.  I counseled patient that I cannot do more steroid injections as it would pose a big risk to her health.  I did request the patient stop taking tramadol and switch over to Tylenol 3 for her severe back pain.  Also recommended she call the clinic where she is to be seen for her severe back pain and arthritis to see if they could move her appointment up from May 8.  She is otherwise supposed to report to the ER if her back pain remains severe despite aggressive intervention.  Side effects reviewed with patient for the medications prescribed today.  Jaynee Eagles, PA-C [facility] [phone] 11/04/2017  8:44 AM

## 2017-11-04 NOTE — Telephone Encounter (Signed)
Pt.'s daughter, Mateo Flow, reports pt. Has more pain this morning than she did yesterday when she saw Dr. [name]. States "the shot she got and the Tramadol are not helping." States she needs "some relief." Can hear pt. In the background moaning.States she was in ED 10/31/17 and had xrays.Appointment made at [facility] per agent. Reason for Disposition . [1] SEVERE pain (e.g., excruciating, unable to do any normal activities) AND [2] not improved after 2 hours of pain medicine  Answer Assessment - Initial Assessment Questions 1. LOCATION and RADIATION: "Where is the pain located?"      Right hip pain and back pain 2. QUALITY: "What does the pain feel like?"  (e.g., sharp, dull, aching, burning)     Aching 3. SEVERITY: "How bad is the pain?" "What does it keep you from doing?"   (Scale 1-10; or mild, moderate, severe)   -  MILD (1-3): doesn't interfere with normal activities    -  MODERATE (4-7): interferes with normal activities (e.g., work or school) or awakens from sleep, limping    -  SEVERE (8-10): excruciating pain, unable to do any normal activities, unable to walk     9 4. ONSET: "When did the pain start?" "Does it come and go, or is it there all the time?"     Last Saturday 5. WORK OR EXERCISE: "Has there been any recent work or exercise that involved this part of the body?"      No 6. CAUSE: "What do you think is causing the hip pain?"      Arthritis 7. AGGRAVATING FACTORS: "What makes the hip pain worse?" (e.g., walking, climbing stairs, running)     Everything 8. OTHER SYMPTOMS: "Do you have any other symptoms?" (e.g., back pain, pain shooting down leg,  fever, rash)     To her back  Protocols used: HIP PAIN-A-AH

## 2017-11-04 NOTE — Patient Instructions (Addendum)
Please do not use tramadol and Tylenol 3 at the same time as this can cause severe constipation and poses an increased risk for side effects including delirium, confusion, sedation, dizziness, difficulty with balance.  Make sure that you are hydrating well and using a stool softener to avoid severe constipation while taking Tylenol 3.  You may also call the orthopedist is going to see you for further management to see if you can get an office visit prior to your currently scheduled visit in May.  If your pain persist despite taking these medications, he have to report to the ER.    IF you received an x-ray today, you will receive an invoice from [facility]. Please contact [facility] at [phone] with questions or concerns regarding your invoice.   IF you received labwork today, you will receive an invoice from [place]. Please contact [facility] at [phone] with questions or concerns regarding your invoice.   Our billing staff will not be able to assist you with questions regarding bills from these companies.  You will be contacted with the lab results as soon as they are available. The fastest way to get your results is to activate your My Chart account. Instructions are located on the last page of this paperwork. If you have not heard from [place] regarding the results in 2 weeks, please contact this office.

## 2017-11-07 ENCOUNTER — Ambulatory Visit (INDEPENDENT_AMBULATORY_CARE_PROVIDER_SITE_OTHER): Payer: Medicare Other | Admitting: *Deleted

## 2017-11-07 ENCOUNTER — Other Ambulatory Visit: Payer: Self-pay | Admitting: Family Medicine

## 2017-11-07 DIAGNOSIS — R55 Syncope and collapse: Secondary | ICD-10-CM | POA: Diagnosis not present

## 2017-11-07 NOTE — Progress Notes (Signed)
Remote pacemaker transmission.   

## 2017-11-07 NOTE — Telephone Encounter (Signed)
Copied from [place] [id number]. Topic: General - Other >> Nov 07, 2017  3:43 PM Oneta Rack wrote: Caller name: Onstott,Valerie Relation to pt: daughter  Call back number: [phone] Pharmacy: [place], [address] [phone] (Phone) [phone] (Fax)   Reason for call:  Daughter requesting losartan (COZAAR) 25 MG tablet refill, due to patient being to new PCP, pharmacy requested patient to call PCP this time.

## 2017-11-08 ENCOUNTER — Encounter: Payer: Self-pay | Admitting: Cardiology

## 2017-11-08 LAB — CUP PACEART REMOTE DEVICE CHECK
Implantable Lead Implant Date: 20180720
Implantable Lead Location: 753860
Implantable Pulse Generator Implant Date: 20180720
MDC IDC LEAD IMPLANT DT: 20180720
MDC IDC LEAD LOCATION: 753859
MDC IDC PG SERIAL: 8917538
MDC IDC SESS DTM: 20190423160811
Pulse Gen Model: 2272

## 2017-11-08 NOTE — Telephone Encounter (Signed)
Losartan refill Last OV: 11/03/17 Last Refill:02/06/17 30 tab/0 refill by another provider Pharmacy: [place], [address] [phone] (Phone) [phone] (Fax)

## 2017-11-08 NOTE — Telephone Encounter (Signed)
See note

## 2017-11-09 ENCOUNTER — Ambulatory Visit: Payer: Self-pay

## 2017-11-09 MED ORDER — LOSARTAN POTASSIUM 25 MG PO TABS: 25.0000 mg | ORAL_TABLET | Freq: Every day | ORAL | 0 refills | Status: DC

## 2017-11-09 NOTE — Telephone Encounter (Signed)
Patient's daughter, Mateo Flow, called and says "mama hasn't had a bowel movement in over a week, I think the last time was the week of 4/8. She has been having hip and back pain, going back and for to the doctor, was put on Tramadol. On the 19th, I took her to [facility] and she was put on Tylenol #3 and was told to alternate that with the Tramadol and to watch for constipation. She's been taking the OTC stool softeners, but still no bowel movement." I asked if the patient was having abdominal pain, the patient says "no and I just had a good bowel movement and it was soft." I asked the daughter how often is she taking the stool softeners, she says "the bottle says up to 3 a day, so she is taking 3 at one time daily." I asked about any other concerns, she says "no. I will call back if I need to." I advised I would send this over to Dr. [name] for any more recommendations for her mother, she verbalized understanding.   Reason for Disposition . Health Information question, no triage required and triager able to answer question  Answer Assessment - Initial Assessment Questions 1. REASON FOR CALL or QUESTION: "What is your reason for calling today?" or "How can I best help you?" or "What question do you have that I can help answer?"     I wanted to know what to do for the constipation  Protocols used: INFORMATION ONLY CALL-A-AH

## 2017-11-14 ENCOUNTER — Other Ambulatory Visit: Payer: Self-pay | Admitting: Family Medicine

## 2017-11-15 ENCOUNTER — Ambulatory Visit (INDEPENDENT_AMBULATORY_CARE_PROVIDER_SITE_OTHER): Payer: Medicare Other

## 2017-11-15 ENCOUNTER — Ambulatory Visit (INDEPENDENT_AMBULATORY_CARE_PROVIDER_SITE_OTHER): Payer: Medicare Other | Admitting: Sports Medicine

## 2017-11-15 ENCOUNTER — Encounter: Payer: Self-pay | Admitting: Sports Medicine

## 2017-11-15 VITALS — BP 110/78 | HR 62 | Ht 66.0 in

## 2017-11-15 DIAGNOSIS — M5441 Lumbago with sciatica, right side: Secondary | ICD-10-CM | POA: Diagnosis not present

## 2017-11-15 DIAGNOSIS — I70223 Atherosclerosis of native arteries of extremities with rest pain, bilateral legs: Secondary | ICD-10-CM

## 2017-11-15 DIAGNOSIS — M5442 Lumbago with sciatica, left side: Secondary | ICD-10-CM

## 2017-11-15 DIAGNOSIS — I714 Abdominal aortic aneurysm, without rupture, unspecified: Secondary | ICD-10-CM

## 2017-11-15 DIAGNOSIS — M5410 Radiculopathy, site unspecified: Secondary | ICD-10-CM

## 2017-11-15 MED ORDER — GABAPENTIN 100 MG PO CAPS: ORAL_CAPSULE | ORAL | 1 refills | Status: DC

## 2017-11-15 NOTE — Progress Notes (Signed)
[name]. Rexanna Louthan, [place] [facility] [place]  ANIKAH HOGGE - 84 y.o. female MRN [id number]  Date of birth: 1931-09-26  Visit Date: 11/15/2017  PCP: Vivi Barrack, MD   Referred by: Vivi Barrack, MD  Scribe for today's visit: Josepha Pigg, CMA     SUBJECTIVE:  [name] is here for Initial Assessment (R hip pain) .  Referred by: Dr. Dimas Chyle Her R hip and R knee pain symptoms INITIALLY: Began 10/29/2017. Dr [name] told her that she has arthritis.  Described as mild aching and pulling (as long as she takes tramadol and tylenol #3), radiating from lower back unto both legs.  Worsened with weight bearing and movement. Improved with resting.  Additional associated symptoms include: She also c/o LBP and pain in the L leg.     At this time symptoms show no change compared to onset  She has been taking Tylenol and Tramadol prn. She received IM Depo 80 injection 11/03/17 and reports minimal relief from this.   Last XR R hip 10/31/17.   ROS Reports night time disturbances. Denies fevers, chills, or night sweats. Denies unexplained weight loss. Denies personal history of cancer. Denies changes in bowel or bladder habits. Denies recent unreported falls. Denies new or worsening dyspnea or wheezing. Denies headaches or dizziness.  Denies numbness, tingling or weakness  In the extremities.  Denies dizziness or presyncopal episodes Reports lower extremity edema    HISTORY & PERTINENT PRIOR DATA:  Prior History reviewed and updated per electronic medical record.  Significant/pertinent history, findings, studies include:  reports that she quit smoking about 7 years ago. Her smoking use included cigarettes. She has a 27.00 pack-year smoking history. She has never used smokeless tobacco. Recent Labs    09/28/17 1129  HGBA1C 6.1   No specialty comments available. No problems updated.  OBJECTIVE:  VS:  HT:5\' 6"   (167.6 cm)   WT:   BMI:     BP:110/78  HR:62bpm  TEMP: ( )  RESP:(2 L O2)   PHYSICAL EXAM: Constitutional: WDWN, Non-toxic appearing. Psychiatric: Alert & appropriately interactive.  Not depressed or anxious appearing. Respiratory: No increased work of breathing.  Trachea Midline Eyes: Pupils are equal.  EOM intact without nystagmus.  No scleral icterus  Vascular Exam: warm to touch no edema  lower extremity neuro exam: unremarkable  MSK Exam: Bilateral lower extremity is overall well aligned.  She is negative straight leg raise but does have tightness with straight leg raise localizing to the posterior hamstring.  She has distal pulses that are barely palpable and generalized 1+ to 2+ pitting edema symmetrically.  Popliteal pulses are barely palpable.  She has pain with logroll as well as Stinchfield testing but is more symptomatic with popliteal compression test and compression of the greater sciatic notch     ASSESSMENT & PLAN:   1. Bilateral low back pain with bilateral sciatica, unspecified chronicity   2. Rest pain of both lower extremities due to atherosclerosis ([place])   3. Aneurysm of abdominal vessel ([name])   4. Radicular pain of right lower extremity     PLAN: We will go ahead and start her on gabapentin as well as send for ABIs.  This may be more reflective of a atherosclerotic condition causing some local rest pain.  If any lack of evidence of this we will plan to further evaluate her lumbar spine with MRI versus considering empiric epidural.  Follow-up:  Return for following doppler study for legs.     Please see additional documentation for Objective, Assessment and Plan sections. Pertinent additional documentation may be included in corresponding procedure notes, imaging studies, problem based documentation and patient instructions. Please see these sections of the encounter for additional information regarding this visit.  CMA/ATC served as Education administrator during this  visit. History, Physical, and Plan performed by medical provider. Documentation and orders reviewed and attested to.      Gerda Diss, DO    [facility] Physician    HISTORY & PERTINENT PRIOR DATA:  Prior History reviewed and updated per electronic medical record.  Significant/pertinent history, findings, studies include:  reports that she quit smoking about 7 years ago. Her smoking use included cigarettes. She has a 27.00 pack-year smoking history. She has never used smokeless tobacco. Recent Labs    09/28/17 1129  HGBA1C 6.1   No specialty comments available. No problems updated.  OBJECTIVE:  VS:  HT:5\' 6"  (167.6 cm)   WT:   BMI:     BP:110/78  HR:62bpm  TEMP: ( )  RESP:(2 L O2)   PHYSICAL EXAM: Constitutional: WDWN, Non-toxic appearing. Psychiatric: Alert & appropriately interactive.  Not depressed or anxious appearing. Respiratory: No increased work of breathing.  Trachea Midline Eyes: Pupils are equal.  EOM intact without nystagmus.  No scleral icterus  Vascular Exam: warm to touch no edema  lower extremity neuro exam: unremarkable  MSK Exam: Bilateral lower extremity is overall well aligned.  She is negative straight leg raise but does have tightness with straight leg raise localizing to the posterior hamstring.  She has distal pulses that are barely palpable and generalized 1+ to 2+ pitting edema symmetrically.  Popliteal pulses are barely palpable.  She has pain with logroll as well as Stinchfield testing but is more symptomatic with popliteal compression test and compression of the greater sciatic notch.     ASSESSMENT & PLAN:   1. Bilateral low back pain with bilateral sciatica, unspecified chronicity   2. Rest pain of both lower extremities due to atherosclerosis ([place])   3. Aneurysm of abdominal vessel ([name])   4. Radicular pain of right lower extremity     PLAN:  Send for ABI given prior history of atherosclerotic disease.  We will like  to start her on gabapentin as well to see if this will help from a radicular component.  If ABIs are nonrevealing we will plan to have her return to discuss further management options including possibility of epidural steroid injection versus further diagnostic evaluation with MRI versus possible intra-articular hip injection but this does not seem to be reflective of a true intra-articular pathology at this time.  Follow-up: Return for following doppler study for legs.       Please see additional documentation for Objective, Assessment and Plan sections. Pertinent additional documentation may be included in corresponding procedure notes, imaging studies, problem based documentation and patient instructions. Please see these sections of the encounter for additional information regarding this visit.  CMA/ATC served as Education administrator during this visit. History, Physical, and Plan performed by medical provider. Documentation and orders reviewed and attested to.      Gerda Diss, [place] [facility] Physician

## 2017-11-18 ENCOUNTER — Ambulatory Visit (HOSPITAL_COMMUNITY)
Admission: RE | Admit: 2017-11-18 | Discharge: 2017-11-18 | Disposition: A | Discharge status: Home / Self Care | Payer: Medicare Other | Source: Ambulatory Visit | Attending: Cardiovascular Disease | Admitting: Cardiovascular Disease

## 2017-11-18 ENCOUNTER — Other Ambulatory Visit: Payer: Self-pay | Admitting: Family Medicine

## 2017-11-18 DIAGNOSIS — I10 Essential (primary) hypertension: Secondary | ICD-10-CM | POA: Insufficient documentation

## 2017-11-18 DIAGNOSIS — I70223 Atherosclerosis of native arteries of extremities with rest pain, bilateral legs: Secondary | ICD-10-CM | POA: Insufficient documentation

## 2017-11-18 DIAGNOSIS — M5441 Lumbago with sciatica, right side: Secondary | ICD-10-CM | POA: Insufficient documentation

## 2017-11-18 DIAGNOSIS — E785 Hyperlipidemia, unspecified: Secondary | ICD-10-CM | POA: Insufficient documentation

## 2017-11-18 DIAGNOSIS — R9389 Abnormal findings on diagnostic imaging of other specified body structures: Secondary | ICD-10-CM | POA: Insufficient documentation

## 2017-11-18 DIAGNOSIS — Z87891 Personal history of nicotine dependence: Secondary | ICD-10-CM | POA: Diagnosis not present

## 2017-11-18 DIAGNOSIS — M5442 Lumbago with sciatica, left side: Secondary | ICD-10-CM | POA: Insufficient documentation

## 2017-11-18 NOTE — Telephone Encounter (Signed)
Copied from [place] [phone]. Topic: Quick Communication - Rx Refill/Question >> Nov 18, 2017  3:05 PM Synthia Innocent wrote: Medication: traMADol (ULTRAM) 50 MG tablet  Has the patient contacted their pharmacy? Yes.   (Agent: If no, request that the patient contact the pharmacy for the refill.) Preferred Pharmacy (with phone number or street name): [facility] on [address]: Please be advised that RX refills may take up to 3 business days. We ask that you follow-up with your pharmacy.

## 2017-11-21 NOTE — Telephone Encounter (Signed)
See note

## 2017-11-21 NOTE — Telephone Encounter (Signed)
Refill request for Tramadol, last filled on 11/03/17 #15, but not on current medication list.  LOV: 11/03/17 Dr. Darci Current on [place]

## 2017-11-21 NOTE — Telephone Encounter (Signed)
See message.

## 2017-11-21 NOTE — Telephone Encounter (Signed)
Daughter calling about medication and to see if it has been filled.  Please call back at [phone]

## 2017-11-21 NOTE — Telephone Encounter (Signed)
Per team health fax from patient call on 11/19/17 12:12: Caller states she called the office yesterday to request refill on pain medication and nothing has been sent to pharmacy. Nurse triage not indicated. Caller requesting pain meds. Nurse Ricard Dillon RN explained: Explained to caller that pain meds are not called in after hours. Patient would have to go to Urgent care or ER for pain medication on the weekend. Caller states urgent care gave her pain meds once on the weekend and she will take her there.

## 2017-11-22 MED ORDER — TRAMADOL HCL 50 MG PO TABS: 50.0000 mg | ORAL_TABLET | Freq: Three times a day (TID) | ORAL | 0 refills | Status: DC | PRN

## 2017-11-22 NOTE — Telephone Encounter (Signed)
Refill sent in. Pt needs f/u appt ASAP if she is still having significant pain to discuss further treatment options.  Alejandra [name]. [name], MD 11/22/2017 8:04 AM

## 2017-11-22 NOTE — Telephone Encounter (Signed)
Please contact patient to advise that the rx was sent in per Dr. [name]. Dr. [name] would like her to have a follow up appointment if she is still having pain to discuss further treatment.

## 2017-11-22 NOTE — Telephone Encounter (Signed)
Patient is scheduled   

## 2017-11-22 NOTE — Addendum Note (Signed)
Addended by: Vivi Barrack on: 11/22/2017 08:05 AM   Modules accepted: Orders

## 2017-11-22 NOTE — Telephone Encounter (Signed)
Called pt to let her know the meds have been sent to pharmacy. Also CMP wants pt to come in for F/U if still having pain. Pt states she has appt. Wednesday but it is with the  Coumadin clinic, not with [facility].

## 2017-11-23 ENCOUNTER — Ambulatory Visit (INDEPENDENT_AMBULATORY_CARE_PROVIDER_SITE_OTHER): Payer: Medicare Other | Admitting: Family Medicine

## 2017-11-23 ENCOUNTER — Ambulatory Visit (INDEPENDENT_AMBULATORY_CARE_PROVIDER_SITE_OTHER): Payer: Medicare Other | Admitting: General Practice

## 2017-11-23 ENCOUNTER — Ambulatory Visit: Payer: Medicare Other | Admitting: Sports Medicine

## 2017-11-23 ENCOUNTER — Encounter: Payer: Self-pay | Admitting: Family Medicine

## 2017-11-23 DIAGNOSIS — Z7901 Long term (current) use of anticoagulants: Secondary | ICD-10-CM

## 2017-11-23 DIAGNOSIS — E1159 Type 2 diabetes mellitus with other circulatory complications: Secondary | ICD-10-CM | POA: Diagnosis not present

## 2017-11-23 DIAGNOSIS — J432 Centrilobular emphysema: Secondary | ICD-10-CM

## 2017-11-23 DIAGNOSIS — I152 Hypertension secondary to endocrine disorders: Secondary | ICD-10-CM

## 2017-11-23 DIAGNOSIS — I10 Essential (primary) hypertension: Secondary | ICD-10-CM

## 2017-11-23 DIAGNOSIS — M1990 Unspecified osteoarthritis, unspecified site: Secondary | ICD-10-CM

## 2017-11-23 LAB — POCT INR: INR: 2.4

## 2017-11-23 MED ORDER — ALBUTEROL SULFATE HFA 108 (90 BASE) MCG/ACT IN AERS: 2.0000 | INHALATION_SPRAY | Freq: Four times a day (QID) | RESPIRATORY_TRACT | 0 refills | Status: AC | PRN

## 2017-11-23 MED ORDER — ACETAMINOPHEN-CODEINE #3 300-30 MG PO TABS: 1.0000 | ORAL_TABLET | Freq: Three times a day (TID) | ORAL | 0 refills | Status: DC | PRN

## 2017-11-23 MED ORDER — BUDESONIDE-FORMOTEROL FUMARATE 80-4.5 MCG/ACT IN AERO: 2.0000 | INHALATION_SPRAY | Freq: Two times a day (BID) | RESPIRATORY_TRACT | 3 refills | Status: DC

## 2017-11-23 NOTE — Assessment & Plan Note (Signed)
Pain is stable.  We will refill her tramadol and Tylenol 3 until she can get back into see sports medicine-she was supposed to see them today however that appointment unfortunately was canceled.  Also continue gabapentin 100 mg twice daily.  Discussed with patient that it is not ideal to be on both tramadol and Tylenol 3 and discussed potential complications including respiratory depression.  Advised patient that this would not be a long-term solution to her pain.

## 2017-11-23 NOTE — Patient Instructions (Signed)
It was nice to see you today!  Please come back to see Dr Paulla Fore next week to discuss your hip pain. I will refill your Tylenol#3 today. We have to be careful with this because it can decrease your breathing. I will not be able to give this to you on a long term basis.  I will also send in symbicort and albuterol.  Come back to see me in 5-6 weeks, or sooner as needed.   Take care, Dr [name]

## 2017-11-23 NOTE — Assessment & Plan Note (Signed)
02 sat at 96% on home 2 L.  She does not have any signs of exacerbation today, however she is not on any sort of controller inhalers.  We will start Symbicort today.  I will also send in a rescue inhaler for her as well.

## 2017-11-23 NOTE — Patient Instructions (Addendum)
Pre visit review using our clinic review tool, if applicable. No additional management support is needed unless otherwise documented below in the visit note.  Continue to take 1 tablet daily except 1 1/2 tablets on Monday and Friday.  Re-check in 4 weeks.

## 2017-11-23 NOTE — Assessment & Plan Note (Signed)
At goal. Continue current medications. 

## 2017-11-23 NOTE — Progress Notes (Signed)
    Subjective:  Alejandra [name] is a 84 y.o. female who presents today with a chief complaint of right hip pain.   HPI:  Right Hip Pain, chronic problem Patient seen about 3 weeks ago for this. She was started on tramadol and given an injection of 80mg  of depomedrol. The injection did not help, but patient reports that the tramadol has helped some. She has also seen sports medicine and was started on gabapentin. She is now taking gabapentin 100mg  bid which helps some. She also occasionally takes Tylenol #3 which helps as well. Pain is stable, though uncontrolled.   COPD, chronic problem Stable. Currently on 2L O2 at rest. Has been on spiriva in the past but is not currently on any medications.   Hypertension, chronic problem Stable.  Compliant with losartan 25 mg daily and Dyazide 37.5-25 mg capsule daily.   ROS: Per HPI  PMH: She reports that she quit smoking about 7 years ago. Her smoking use included cigarettes. She has a 27.00 pack-year smoking history. She has never used smokeless tobacco. She reports that she does not drink alcohol or use drugs.   Objective:  Physical Exam: BP 102/64   Pulse (!) 58   Temp (!) 97.4 F (36.3 C) (Oral)   Resp 12   SpO2 96% Comment: 2L02  Gen: NAD, resting comfortably CV: RRR with no murmurs appreciated Pulm: NWOB, CTAB with no crackles, wheezes, or rhonchi.  2 L nasal cannula in place. MSK: Limited internal and external rotation of right hip.  Neurovascular intact distally.  Assessment/Plan:  Osteoarthritis Pain is stable.  We will refill her tramadol and Tylenol 3 until she can get back into see sports medicine-she was supposed to see them today however that appointment unfortunately was canceled.  Also continue gabapentin 100 mg twice daily.  Discussed with patient that it is not ideal to be on both tramadol and Tylenol 3 and discussed potential complications including respiratory depression.  Advised patient that this would not be a  long-term solution to her pain.  COPD (chronic obstructive pulmonary disease) ([place]) 02 sat at 96% on home 2 L.  She does not have any signs of exacerbation today, however she is not on any sort of controller inhalers.  We will start Symbicort today.  I will also send in a rescue inhaler for her as well.  Hypertension associated with diabetes ([place]) At goal.  Continue current medications.   Algis Greenhouse. [name], MD 11/23/2017 12:02 PM

## 2017-11-23 NOTE — Progress Notes (Signed)
I have reviewed the patient's encounter and agree with the documentation.  Algis Greenhouse. [name], MD 11/23/2017 11:39 AM

## 2017-11-26 ENCOUNTER — Encounter: Payer: Self-pay | Admitting: Sports Medicine

## 2017-12-01 ENCOUNTER — Encounter: Payer: Self-pay | Admitting: Sports Medicine

## 2017-12-01 ENCOUNTER — Ambulatory Visit: Payer: Self-pay

## 2017-12-01 ENCOUNTER — Ambulatory Visit (INDEPENDENT_AMBULATORY_CARE_PROVIDER_SITE_OTHER): Payer: Medicare Other | Admitting: Sports Medicine

## 2017-12-01 VITALS — BP 110/82 | HR 72

## 2017-12-01 DIAGNOSIS — M25511 Pain in right shoulder: Secondary | ICD-10-CM

## 2017-12-01 DIAGNOSIS — M150 Primary generalized (osteo)arthritis: Secondary | ICD-10-CM

## 2017-12-01 DIAGNOSIS — M8949 Other hypertrophic osteoarthropathy, multiple sites: Secondary | ICD-10-CM

## 2017-12-01 DIAGNOSIS — R262 Difficulty in walking, not elsewhere classified: Secondary | ICD-10-CM | POA: Diagnosis not present

## 2017-12-01 DIAGNOSIS — G8929 Other chronic pain: Secondary | ICD-10-CM

## 2017-12-01 DIAGNOSIS — G894 Chronic pain syndrome: Secondary | ICD-10-CM | POA: Diagnosis not present

## 2017-12-01 DIAGNOSIS — M159 Polyosteoarthritis, unspecified: Secondary | ICD-10-CM

## 2017-12-01 NOTE — Progress Notes (Signed)
PROCEDURE NOTE : right subacromial Injection After discussing the risks, benefits and expected outcomes of the injection and all questions were reviewed and answered,  she wished to undergo the above named procedure.  Written consent was obtained. After an appropriate time out was taken the target structure prepped and injected as below: Prep:  Betadine and alcohol,  Approach:  posterior  Anesthes  Ethel chloride,   Needle:  22g 1.5"  Aspirate:  N/a  Meds:  3cc 0.5% Marcaine, 1cc 40mg  DepoMedrol  Dressing:  Bandaid  This procedure was well tolerated and there were no complications.

## 2017-12-01 NOTE — Progress Notes (Signed)
Alejandra [name]  Alejandra [name] - 84 y.o. female MRN [id number]  Date of birth: 18-Nov-1931  Visit Date: 12/01/2017  PCP: Vivi Barrack, MD   Referred by: Vivi Barrack, MD  Scribe for today's visit: Josepha Pigg, CMA     SUBJECTIVE:  Alejandra [name] is here for Follow-up (bilateral LBP)  11/15/2017: Her R hip and R knee pain symptoms INITIALLY: Began 10/29/2017. Dr [name] told her that she has arthritis.  Described as mild aching and pulling (as long as she takes tramadol and tylenol #3), radiating from lower back unto both legs.  Worsened with weight bearing and movement. Improved with resting.  Additional associated symptoms include: She also c/o LBP and pain in the L leg.     At this time symptoms show no change compared to onset  She has been taking Tylenol and Tramadol prn. She received IM Depo 80 injection 11/03/17 and reports minimal relief from this.   Last XR R hip 10/31/17.   12/01/2017: Compared to the last office visit, her previously described symptoms show little change. She continues to have sciatica, R side > L side.  Current symptoms are mild & are radiating to both legs.  She has been taking Gabapentin and feels that this has helped at nighttime. She feels like she is able to straighten her leg a little more easily. She also has Tramadol and Tylenol #3 which provides some relief.   She had ABI done 11/18/2017.   She c/o neck pain that radiates into the shoulders and upper back.   ROS Reports night time disturbances. Denies fevers, chills, or night sweats. Denies unexplained weight loss. Denies personal history of cancer. Denies changes in bowel or bladder habits. Denies recent unreported falls. Denies new or worsening dyspnea or wheezing. Denies headaches or dizziness.  Reports numbness, tingling or weakness  In the extremities.  Denies dizziness or presyncopal  episodes Reports lower extremity edema    HISTORY & PERTINENT PRIOR DATA:  Prior History reviewed and updated per electronic medical record.  Significant/pertinent history, findings, studies include:  reports that she quit smoking about 7 years ago. Her smoking use included cigarettes. She has a 27.00 pack-year smoking history. She has never used smokeless tobacco. Recent Labs    09/28/17 1129  HGBA1C 6.1   No specialty comments available. No problems updated.  OBJECTIVE:  VS:  HT:    WT:   BMI:     BP:110/82  HR:72bpm  TEMP: ( )  RESP:98 % on 2L   PHYSICAL EXAM: Constitutional: WDWN, Non-toxic appearing. Slightly cachatetic Psychiatric: Flattened affect. Not anxious Alert & appropriately interactive.  Respiratory: No increased work of breathing.  Trachea Midline Eyes: Pupils are equal.  EOM intact without nystagmus.  No scleral icterus  Vascular Exam: warm to touch no edema  upper and lower extremity neuro exam: unremarkable diminished strength in bilateral upper extremities diffusely.  Worse with triceps extension. diminished DTR in the right triceps compared to the left.  MSK Exam: She has pain with Spurling's compression test and Lhermitte's compression test but this is nonlocalizing and nonradiating.  She has a generalized hyperesthesia throughout all upper and lower extremities.  Multiple trigger points appreciated in the paraspinal musculature.  She has an exaggerated kyphotic curvature of her thoracic and cervical spine with a significantly crouched forward hunched over position at rest.  Her right shoulder has a small amount of pain  with axial loading circumduction but no significant crepitation but she does have marked limitations in range of motion.  She has pain with Luan Pulling as well as Neer's.   ASSESSMENT & PLAN:   1. Chronic right shoulder pain   2. Chronic pain syndrome   3. Primary osteoarthritis involving multiple joints     PLAN: Patient has  polyarthralgia complaints and multiple areas of significant arthritis including a suspected right shoulder cervical and lumbar spines.  Her bilateral hips move quite well I do not think that hip and knee arthritis is contributing greatly to her pain.  Gabapentin previously prescribed has been beneficial to helping her sleep at night.  Tramadol and Tylenol 3 were prescribed by her PCP and after discussing this with him a referral to pain management will be placed and consideration of a fentanyl patch will be recommended but will defer management to their expertise.  We will ask for home health PT to come and evaluate her given her the lack of mobility and inability to ambulate outside at home without significant difficulty and the fact that she only leaves for doctor's appointments.   Right subacromial injection performed today per procedure note.  Follow-up: Return in about 6 weeks (around 01/12/2018).     Please see additional documentation for Objective, Assessment and Plan sections. Pertinent additional documentation may be included in corresponding procedure notes, imaging studies, problem based documentation and patient instructions. Please see these sections of the encounter for additional information regarding this visit.  CMA/ATC served as Education administrator during this visit. History, Physical, and Plan performed by medical provider. Documentation and orders reviewed and attested to.      Gerda Diss, [facility] Physician

## 2017-12-01 NOTE — Patient Instructions (Signed)

## 2017-12-07 ENCOUNTER — Telehealth: Payer: Self-pay

## 2017-12-07 NOTE — Telephone Encounter (Signed)
Daughter called into [facility] regarding referral for her mother Alejandra [name], [place].  Sending to [place] and Theadora Rama [facility] ref.) for further questions.

## 2017-12-07 NOTE — Telephone Encounter (Signed)
Spoke with Alejandra [name] and advised that referrals have been sent. I also advised of turn around time and provided number to home health. No further action needed at this time.

## 2017-12-15 ENCOUNTER — Other Ambulatory Visit: Payer: Self-pay | Admitting: Family Medicine

## 2017-12-15 MED ORDER — ACETAMINOPHEN-CODEINE #3 300-30 MG PO TABS: 1.0000 | ORAL_TABLET | Freq: Three times a day (TID) | ORAL | 0 refills | Status: DC | PRN

## 2017-12-15 NOTE — Telephone Encounter (Signed)
Called and spoke with Judeen Hammans at [facility], was advised that referral had not been received. Referral refaxed, they will contact pt once insurance has been verified.

## 2017-12-15 NOTE — Telephone Encounter (Signed)
Please advise 

## 2017-12-15 NOTE — Telephone Encounter (Signed)
Also, per telephone encounter 12/07/17:  Spoke with Mateo Flow and advised that referrals have been sent. I also advised of turn around time and provided number to home health. No further action needed at this time.

## 2017-12-15 NOTE — Telephone Encounter (Signed)
Will send in refill

## 2017-12-15 NOTE — Addendum Note (Signed)
Addended by: Elmer Bales on: 12/15/2017 11:35 AM   Modules accepted: Orders

## 2017-12-15 NOTE — Telephone Encounter (Signed)
Call to Ms. [place]  Call to fup on the patient assistance form for her inhaler. Her dtr answered and stated Dr. [name] has changed her inhaler to something more affordable.  She is ok for now. Will let me know if I can assist in the future.  Wynetta Fines RN

## 2017-12-15 NOTE — Telephone Encounter (Signed)
See note.   >> Dec 15, 2017 10:59 AM Oneta Rack wrote: [name] name: Alejandra [name]  Relation to pt: daughter  Call back number:  [phone] Pharmacy:  [facility] [place], [place] [phone] (Phone) [phone] (Fax)    Reason for call:  Daughter checking on the status of [facility] orders and requesting  acetaminophen-codeine (TYLENOL #3) 300-30 MG tablet, daughter states patient is in severe pain and would like to speak with a nurse today, please advise

## 2017-12-16 ENCOUNTER — Telehealth: Payer: Self-pay | Admitting: Family Medicine

## 2017-12-16 NOTE — Telephone Encounter (Signed)
Patient's daughter has been notified.

## 2017-12-16 NOTE — Telephone Encounter (Signed)
Copied from [place] [id number]. Topic: Inquiry >> Dec 16, 2017 11:23 AM Pricilla Handler wrote: Reason for CRM: Patient's daughter called requesting a refill of patient's Acetaminophen-codeine (TYLENOL #3) 300-30 MG tablet. Patient's daughter states that she is in a lot of pain, and completely out of this medication currently. Please call [phone].         Thank You!!!

## 2017-12-16 NOTE — Telephone Encounter (Signed)
This refill was sent in yesterday, 12/15/2017.

## 2017-12-21 ENCOUNTER — Ambulatory Visit: Payer: Medicare Other

## 2017-12-30 ENCOUNTER — Other Ambulatory Visit: Payer: Self-pay | Admitting: Family Medicine

## 2018-01-02 NOTE — Telephone Encounter (Signed)
Please advise 

## 2018-01-04 ENCOUNTER — Encounter: Payer: Self-pay | Admitting: Sports Medicine

## 2018-01-04 ENCOUNTER — Ambulatory Visit (INDEPENDENT_AMBULATORY_CARE_PROVIDER_SITE_OTHER): Payer: Medicare Other | Admitting: General Practice

## 2018-01-04 ENCOUNTER — Telehealth: Payer: Self-pay | Admitting: Family Medicine

## 2018-01-04 ENCOUNTER — Ambulatory Visit (INDEPENDENT_AMBULATORY_CARE_PROVIDER_SITE_OTHER): Payer: Medicare Other | Admitting: Family Medicine

## 2018-01-04 ENCOUNTER — Encounter: Payer: Self-pay | Admitting: Family Medicine

## 2018-01-04 ENCOUNTER — Ambulatory Visit (INDEPENDENT_AMBULATORY_CARE_PROVIDER_SITE_OTHER): Payer: Medicare Other | Admitting: Sports Medicine

## 2018-01-04 VITALS — BP 118/78 | HR 68 | Ht 66.0 in | Wt 138.0 lb

## 2018-01-04 DIAGNOSIS — N183 Chronic kidney disease, stage 3 unspecified: Secondary | ICD-10-CM

## 2018-01-04 DIAGNOSIS — J432 Centrilobular emphysema: Secondary | ICD-10-CM

## 2018-01-04 DIAGNOSIS — M25511 Pain in right shoulder: Secondary | ICD-10-CM | POA: Diagnosis not present

## 2018-01-04 DIAGNOSIS — E1122 Type 2 diabetes mellitus with diabetic chronic kidney disease: Secondary | ICD-10-CM | POA: Diagnosis not present

## 2018-01-04 DIAGNOSIS — Z7901 Long term (current) use of anticoagulants: Secondary | ICD-10-CM

## 2018-01-04 DIAGNOSIS — M1990 Unspecified osteoarthritis, unspecified site: Secondary | ICD-10-CM | POA: Diagnosis not present

## 2018-01-04 DIAGNOSIS — R262 Difficulty in walking, not elsewhere classified: Secondary | ICD-10-CM | POA: Diagnosis not present

## 2018-01-04 DIAGNOSIS — G8929 Other chronic pain: Secondary | ICD-10-CM | POA: Diagnosis not present

## 2018-01-04 DIAGNOSIS — M5442 Lumbago with sciatica, left side: Secondary | ICD-10-CM | POA: Diagnosis not present

## 2018-01-04 DIAGNOSIS — I482 Chronic atrial fibrillation, unspecified: Secondary | ICD-10-CM

## 2018-01-04 DIAGNOSIS — G894 Chronic pain syndrome: Secondary | ICD-10-CM | POA: Diagnosis not present

## 2018-01-04 DIAGNOSIS — L409 Psoriasis, unspecified: Secondary | ICD-10-CM | POA: Diagnosis not present

## 2018-01-04 DIAGNOSIS — M5441 Lumbago with sciatica, right side: Secondary | ICD-10-CM

## 2018-01-04 LAB — POCT INR: INR: 1.9 — AB (ref 2.0–3.0)

## 2018-01-04 MED ORDER — TAZAROTENE 0.1 % EX FOAM: CUTANEOUS | 1 refills | Status: AC

## 2018-01-04 MED ORDER — PREDNISONE 20 MG PO TABS: ORAL_TABLET | ORAL | 0 refills | Status: DC

## 2018-01-04 MED ORDER — TRIAMCINOLONE ACETONIDE 0.5 % EX OINT: 1.0000 "application " | TOPICAL_OINTMENT | Freq: Two times a day (BID) | CUTANEOUS | 0 refills | Status: DC

## 2018-01-04 NOTE — Progress Notes (Signed)
Alejandra [name]. Alejandra [name], [name]  Alejandra [name] - 84 y.o. female MRN [id number]  Date of birth: 1931/12/12  Visit Date: 01/04/2018  PCP: Vivi Barrack, MD   Referred by: Vivi Barrack, MD  Scribe(s) for today's visit: Wendy Poet, LAT, ATC  SUBJECTIVE:  Alejandra [name] is here for Follow-up (LBP, neck pain and R shoulder pain) .    11/15/2017: Her R hip and R knee pain symptoms INITIALLY: Began 10/29/2017. Dr [name] told her that she has arthritis.  Described as mild aching and pulling (as long as she takes tramadol and tylenol #3), radiating from lower back unto both legs.  Worsened with weight bearing and movement. Improved with resting.  Additional associated symptoms include: She also c/o LBP and pain in the L leg.     At this time symptoms show no change compared to onset  She has been taking Tylenol and Tramadol prn. She received IM Depo 80 injection 11/03/17 and reports minimal relief from this.   Last XR R hip 10/31/17.   12/01/2017: Compared to the last office visit, her previously described symptoms show little change. She continues to have sciatica, R side > L side.  Current symptoms are mild & are radiating to both legs.  She has been taking Gabapentin and feels that this has helped at nighttime. She feels like she is able to straighten her leg a little more easily. She also has Tramadol and Tylenol #3 which provides some relief.   She had ABI done 11/18/2017.   She c/o neck pain that radiates into the shoulders and upper back.   06/19/19L Compared to the last office visit on 12/01/17, her previously described LBP, neck pain and R shoulder pain symptoms show no change except for the R shoulder which improved after she had an injection at her last visit. Current symptoms are moderate & are radiating to B LEs R>L. She has been taking Gabapentin,Tylenol #3.  She was referred to pain management and for  home health PT at her last visit.  She also had a R shoulder injection on 12/01/17.   Her R thumb (1st MCP joint) symptoms INITIALLY: Began unknown timeframe w/ no specific MOI Described as mild-mod aching pain, radiating to R forearm Worsened with hitting the thumb Improved with nothing noted Additional associated symptoms include: no N/T noted in the R thumb or fingers    At this time symptoms are worsening compared to onset with increased pain and swelling in the R 1st MCP joint. She has been taking her medications for previously treated conditions as noted above.   REVIEW OF SYSTEMS: Denies night time disturbances. Denies fevers, chills, or night sweats. Denies unexplained weight loss. Denies personal history of cancer. Denies changes in bowel or bladder habits. Denies recent unreported falls. Denies new or worsening dyspnea or wheezing. Denies headaches or dizziness.  Reports numbness, tingling or weakness  In the extremities in B LEs (R>L) Denies dizziness or presyncopal episodes Denies lower extremity edema    HISTORY & PERTINENT PRIOR DATA:  Prior History reviewed and updated per electronic medical record.  Significant/pertinent history, findings, studies include:  reports that she quit smoking about 7 years ago. Her smoking use included cigarettes. She has a 27.00 pack-year smoking history. She has never used smokeless tobacco. Recent Labs    09/28/17 1129  HGBA1C 6.1   No specialty comments available. No problems updated.  OBJECTIVE:  VS:  HT:5\' 6"  (167.6 cm)   WT:138 lb (62.6 kg)  BMI:22.28    BP:118/78  HR:68bpm  TEMP: ( )  RESP:    PHYSICAL EXAM: Constitutional: WDWN, Non-toxic appearing. Psychiatric: Alert & appropriately interactive.  Not depressed or anxious appearing. Respiratory: On O2 No increased work of breathing.  Trachea Midline Eyes: Pupils are equal.  EOM intact without nystagmus.  No scleral icterus  Vascular Exam: warm to touch no  edema Pulses are palpable although faint.  lower extremity neuro exam: unremarkable Negative straight leg raises bilaterally.  MSK Exam: Right thumb with significant generalized osteoarthritic bossing most focally over the MCP joint.  She has somewhat limited internal and external rotation of bilateral hips right worse than left but has minimal pain with endrange.  Negative FADIR and negative Stinchfield.  She has pain with popliteal compression test on the right greater than left.   ASSESSMENT & PLAN:   1. Long term (current) use of anticoagulants   2. Type 2 diabetes mellitus with stage 3 chronic kidney disease, without long-term current use of insulin ([place])   3. Osteoarthritis, unspecified osteoarthritis type, unspecified site   4. Chronic right shoulder pain   5. Chronic pain syndrome   6. Difficulty walking   7. Bilateral low back pain with bilateral sciatica, unspecified chronicity     PLAN: She has not been contacted by home health physical therapy yet to discuss this with accompanied today and they will schedule something within the next 24 hours.  This should be very beneficial.  Although she continues to have difficulty with ambulation she has minimal focal neurologic symptoms.  I would like to start her on a see if this can help reduce some of the inflammation as well as her related pain that she is experiencing.  Continue with gabapentin.  A1c is recently been good.  Cautioned on potential risks of corticosteroids including increased weight gain and appetite but this point the generalized immobility that she has well likely significantly benefit from systemic steroids.  If any lack of improvement further diagnostic evaluation with MRI and plain film x-ray follow-up could be considered.  Follow-up: Return in about 8 weeks (around 03/01/2018) for repeat clinical exam.      Please see additional documentation for Objective, Assessment and Plan sections. Pertinent additional  documentation may be included in corresponding procedure notes, imaging studies, problem based documentation and patient instructions. Please see these sections of the encounter for additional information regarding this visit.  CMA/ATC served as Education administrator during this visit. History, Physical, and Plan performed by medical provider. Documentation and orders reviewed and attested to.      Gerda Diss, [facility] Physician

## 2018-01-04 NOTE — Assessment & Plan Note (Signed)
Start topical triamcinolone today.  We will also give a prescription for tazarotene for her scalp.  If no improvement, would consider referral to dermatology for discussion of Biologics.

## 2018-01-04 NOTE — Assessment & Plan Note (Signed)
Stable.  Continue Symbicort.  Prior authorization form for Spiriva was completed and faxed in.  Continue albuterol as needed.  Respiratory exam is stable today.  She will follow-up with me in 3 months.

## 2018-01-04 NOTE — Telephone Encounter (Signed)
See note

## 2018-01-04 NOTE — Patient Instructions (Signed)
It was very nice to see you today!  Please use the foam once daily for your scalp psoriasis. Please start triamcinolone ointment 2 times daily for psoriasis and other areas.  Please continue taking your stool softener.  We do not need to make any other medication changes today.  Come back to see me in 3 months to follow-up on your blood sugar/diabetes.  Come back to see me sooner as needed.  Take care, Dr [name]

## 2018-01-04 NOTE — Assessment & Plan Note (Signed)
Seems to be doing well on her current regimen.  We will continue her glipizide as well as her metformin.  She will follow-up with me in 3 months for her repeat A1c.

## 2018-01-04 NOTE — Progress Notes (Signed)
   Subjective:  Alejandra [name] is a 84 y.o. female who presents today with a chief complaint of diabetes follow up.   HPI:  Type 2 diabetes, chronic problem Currently on metformin 500 mg twice daily and glipizide 5 mg daily.  Tolerates both these well without reported side effects.  Psoriasis, chronic problem, new to provider Several year history.  Symptoms have worsened significantly over the last couple weeks.  She is previously used cream which has helped.  She does not remember the name of this cream or ointment.  Currently has an outbreak on her right side, buttocks, and posterior scalp.  COPD, chronic problem Patient seen a few months ago for this.  That time she was started on Symbicort.  She has done well with that.  She was previously on Spiriva.  She would like to restart this today.  She has a prior authorization form that needs to be completed for this today.  Overall, her breathing/COPD is stable.  ROS: Per HPI  PMH: She reports that she quit smoking about 7 years ago. Her smoking use included cigarettes. She has a 27.00 pack-year smoking history. She has never used smokeless tobacco. She reports that she does not drink alcohol or use drugs.  Objective:  Physical Exam: BP 118/78 (BP Location: Left Arm, Patient Position: Sitting, Cuff Size: Normal)   Temp 98.4 F (36.9 C) (Oral)   Ht 5\' 6"  (1.676 m)   Wt 138 lb (62.6 kg)   BMI 22.27 kg/m   Gen: NAD, resting comfortably CV: RRR with no murmurs appreciated Pulm: NWOB, CTAB with no crackles, wheezes, or rhonchi Skin: Several erythematous patches with overlying silvery scale on gluteal cleft, right flank, and inferior posterior scalp.  Assessment/Plan:  Type 2 diabetes mellitus with stage 3 chronic kidney disease, without long-term current use of insulin ([name]) Seems to be doing well on her current regimen.  We will continue her glipizide as well as her metformin.  She will follow-up with me in 3 months for her repeat  A1c.  Psoriasis Start topical triamcinolone today.  We will also give a prescription for tazarotene for her scalp.  If no improvement, would consider referral to dermatology for discussion of Biologics.  COPD (chronic obstructive pulmonary disease) ([name]) Stable.  Continue Symbicort.  Prior authorization form for Spiriva was completed and faxed in.  Continue albuterol as needed.  Respiratory exam is stable today.  She will follow-up with me in 3 months.  Osteoarthritis Stable.  Continue Tylenol 3 as needed.  Has referral to home health and pain management pending.  She will be following up with sports medicine later today.  Algis Greenhouse. [name], MD 01/04/2018 11:47 AM

## 2018-01-04 NOTE — Assessment & Plan Note (Signed)
Stable.  Continue Tylenol 3 as needed.  Has referral to home health and pain management pending.  She will be following up with sports medicine later today.

## 2018-01-04 NOTE — Telephone Encounter (Signed)
Copied from [place] [phone] [phone]. Topic: Quick Communication - See Telephone Encounter >> Jan 04, 2018  2:12 PM [name], NT wrote: CRM for notification. See Telephone encounter for: 01/04/18. PTs daughter calling and states that her mom needs a note that states she is unable to take care of her grandson Alejandra [name] and that he will need to stay with his brother Alejandra [name]. Needs this for when Alejandra [name] goes to school. CB#: [phone]

## 2018-01-04 NOTE — Patient Instructions (Addendum)
Pre visit review using our clinic review tool, if applicable. No additional management support is needed unless otherwise documented below in the visit note.  Take extra 1/2 tablet today and then continue to take 1 tablet daily except 1 1/2 tablets on Monday and Friday.  Re-check in 4 weeks.

## 2018-01-05 ENCOUNTER — Telehealth: Payer: Self-pay | Admitting: Family Medicine

## 2018-01-05 ENCOUNTER — Other Ambulatory Visit: Payer: Self-pay

## 2018-01-05 NOTE — Telephone Encounter (Signed)
See note

## 2018-01-05 NOTE — Telephone Encounter (Unsigned)
Copied from [place] [phone]. Topic: Quick Communication - See Telephone Encounter >> Jan 05, 2018 12:34 PM Percell Belt A wrote: CRM for notification. See Telephone encounter for: 01/05/18.  Pt daughter called in and would like cindy Luciana Axe to call her about coumadin appt in July   Best number  [phone]

## 2018-01-05 NOTE — Telephone Encounter (Signed)
Noted  

## 2018-01-05 NOTE — Telephone Encounter (Signed)
[place] with letter saying she can't take care of her grandson. I can't say that he should stay with his brother since he is not a patient of mine.  Algis Greenhouse. [name], MD 01/05/2018 10:07 AM

## 2018-01-05 NOTE — Telephone Encounter (Signed)
Please advise 

## 2018-01-06 NOTE — Telephone Encounter (Signed)
Letter has been written. 

## 2018-01-06 NOTE — Telephone Encounter (Signed)
LM to return call.  CRM started.  Letter placed at front for pick up.

## 2018-01-10 ENCOUNTER — Other Ambulatory Visit: Payer: Self-pay

## 2018-01-10 MED ORDER — ADAPALENE 0.1 % EX GEL: CUTANEOUS | 0 refills | Status: DC

## 2018-01-24 ENCOUNTER — Encounter: Payer: Self-pay | Admitting: Cardiology

## 2018-01-27 NOTE — Telephone Encounter (Signed)
Patient's daughter notified and will pick letter up.

## 2018-02-01 ENCOUNTER — Ambulatory Visit (INDEPENDENT_AMBULATORY_CARE_PROVIDER_SITE_OTHER): Payer: Medicare Other | Admitting: General Practice

## 2018-02-01 DIAGNOSIS — I482 Chronic atrial fibrillation, unspecified: Secondary | ICD-10-CM

## 2018-02-01 DIAGNOSIS — Z7901 Long term (current) use of anticoagulants: Secondary | ICD-10-CM

## 2018-02-01 LAB — POCT INR: INR: 2.8 (ref 2.0–3.0)

## 2018-02-01 NOTE — Patient Instructions (Addendum)
Pre visit review using our clinic review tool, if applicable. No additional management support is needed unless otherwise documented below in the visit note.  Continue to take 1 tablet daily except 1 1/2 tablets on Monday and Friday.  Re-check in 4 weeks.

## 2018-02-06 ENCOUNTER — Ambulatory Visit (INDEPENDENT_AMBULATORY_CARE_PROVIDER_SITE_OTHER): Payer: Medicare Other | Admitting: *Deleted

## 2018-02-06 ENCOUNTER — Telehealth: Payer: Self-pay

## 2018-02-06 DIAGNOSIS — R55 Syncope and collapse: Secondary | ICD-10-CM | POA: Diagnosis not present

## 2018-02-06 NOTE — Telephone Encounter (Signed)
Confirmed remote transmission w/ pt daughter.  I advised the daughter to call [place] for assistants with the monitor

## 2018-02-07 ENCOUNTER — Encounter: Payer: Self-pay | Admitting: Cardiology

## 2018-02-07 NOTE — Progress Notes (Signed)
Letter  

## 2018-02-07 NOTE — Progress Notes (Signed)
Remote pacemaker transmission.   

## 2018-02-08 ENCOUNTER — Ambulatory Visit (INDEPENDENT_AMBULATORY_CARE_PROVIDER_SITE_OTHER): Payer: Medicare Other | Admitting: Cardiology

## 2018-02-08 ENCOUNTER — Encounter: Payer: Self-pay | Admitting: Cardiology

## 2018-02-08 VITALS — BP 138/74 | HR 78 | Ht 65.0 in | Wt 140.8 lb

## 2018-02-08 DIAGNOSIS — Z950 Presence of cardiac pacemaker: Secondary | ICD-10-CM

## 2018-02-08 DIAGNOSIS — I459 Conduction disorder, unspecified: Secondary | ICD-10-CM

## 2018-02-08 DIAGNOSIS — I10 Essential (primary) hypertension: Secondary | ICD-10-CM | POA: Diagnosis not present

## 2018-02-08 DIAGNOSIS — I480 Paroxysmal atrial fibrillation: Secondary | ICD-10-CM | POA: Diagnosis not present

## 2018-02-08 DIAGNOSIS — E785 Hyperlipidemia, unspecified: Secondary | ICD-10-CM

## 2018-02-08 LAB — CUP PACEART INCLINIC DEVICE CHECK
Battery Remaining Longevity: 130 mo
Battery Voltage: 2.99 V
Implantable Lead Implant Date: 20180720
Implantable Lead Location: 753860
Implantable Pulse Generator Implant Date: 20180720
Lead Channel Impedance Value: 387.5 Ohm
Lead Channel Pacing Threshold Amplitude: 0.75 V
Lead Channel Pacing Threshold Pulse Width: 0.5 ms
Lead Channel Pacing Threshold Pulse Width: 0.5 ms
Lead Channel Setting Pacing Amplitude: 2.5 V
Lead Channel Setting Pacing Pulse Width: 0.5 ms
Lead Channel Setting Sensing Sensitivity: 2 mV
MDC IDC LEAD IMPLANT DT: 20180720
MDC IDC LEAD LOCATION: 753859
MDC IDC MSMT LEADCHNL RA PACING THRESHOLD AMPLITUDE: 0.75 V
MDC IDC MSMT LEADCHNL RA SENSING INTR AMPL: 5 mV
MDC IDC MSMT LEADCHNL RV IMPEDANCE VALUE: 450 Ohm
MDC IDC MSMT LEADCHNL RV SENSING INTR AMPL: 7.7 mV
MDC IDC PG SERIAL: 8917538
MDC IDC SESS DTM: 20190724153549
MDC IDC SET LEADCHNL RA PACING AMPLITUDE: 2 V
MDC IDC STAT BRADY RA PERCENT PACED: 1.1 %
MDC IDC STAT BRADY RV PERCENT PACED: 0.05 %

## 2018-02-08 MED ORDER — METOPROLOL TARTRATE 100 MG PO TABS: 100.0000 mg | ORAL_TABLET | Freq: Two times a day (BID) | ORAL | 3 refills | Status: DC

## 2018-02-08 NOTE — Patient Instructions (Addendum)
Medication Instructions:  Your physician has recommended you make the following change in your medication: 1. INCREASE Metoprolol Tartrate to 100 mg twice a day  *If you need a refill on your cardiac medications before your next appointment, please call your pharmacy*  Labwork: None ordered  Testing/Procedures: None ordered  Follow-Up: Remote monitoring is used to monitor your Pacemaker or ICD from home. This monitoring reduces the number of office visits required to check your device to one time per year. It allows [place] to keep an eye on the functioning of your device to ensure it is working properly. You are scheduled for a device check from home on 05/08/2018. You may send your transmission at any time that day. If you have a wireless device, the transmission will be sent automatically. After your physician reviews your transmission, you will receive a postcard with your next transmission date.  Your physician wants you to follow-up in: 1 year with Dr. Curt Bears.  You will receive a reminder letter in the mail two months in advance. If you don't receive a letter, please call our office to schedule the follow-up appointment.  Thank you for choosing [facility]!!   Trinidad Curet, RN [phone]
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
## 2018-02-14 ENCOUNTER — Other Ambulatory Visit: Payer: Self-pay | Admitting: Family Medicine

## 2018-02-16 ENCOUNTER — Telehealth: Payer: Self-pay | Admitting: Family Medicine

## 2018-02-16 ENCOUNTER — Other Ambulatory Visit: Payer: Self-pay

## 2018-02-16 MED ORDER — TRIAMTERENE-HCTZ 37.5-25 MG PO CAPS: 1.0000 | ORAL_CAPSULE | Freq: Every day | ORAL | 3 refills | Status: DC

## 2018-02-16 NOTE — Telephone Encounter (Signed)
Copied from [place] [id number]. Topic: Quick Communication - Rx Refill/Question >> Feb 16, 2018  2:12 PM Wynetta Emery, [place] C wrote: Medication: triamterene-hydrochlorothiazide (DYAZIDE) 37.5-25 MG capsule --- pt is completely out of her medication. Daughter says that says that they have requested this refill via pharmacy several times. Not showing.   Has the patient contacted their pharmacy? Yes  (Agent: If no, request that the patient contact the pharmacy for the refill.) (Agent: If yes, when and what did the pharmacy advise?  Preferred Pharmacy (with phone number or street name): [facility] - [address] [place] [phone] (Phone) [phone] (Fax)      Agent: Please be advised that RX refills may take up to 3 business days. We ask that you follow-up with your pharmacy.

## 2018-02-16 NOTE — Telephone Encounter (Signed)
This is the first request I have received for this medication.  Rx has been sent to pharmacy. 

## 2018-02-20 ENCOUNTER — Other Ambulatory Visit: Payer: Self-pay

## 2018-02-20 ENCOUNTER — Ambulatory Visit (HOSPITAL_COMMUNITY)
Admission: RE | Admit: 2018-02-20 | Discharge: 2018-02-20 | Disposition: A | Discharge status: Home / Self Care | Payer: Medicare Other | Source: Ambulatory Visit | Attending: Family | Admitting: Family

## 2018-02-20 ENCOUNTER — Encounter: Payer: Self-pay | Admitting: Family

## 2018-02-20 ENCOUNTER — Ambulatory Visit (INDEPENDENT_AMBULATORY_CARE_PROVIDER_SITE_OTHER): Payer: Medicare Other | Admitting: Family

## 2018-02-20 VITALS — BP 126/87 | HR 72 | Temp 97.3°F | Resp 16 | Ht 65.0 in | Wt 144.0 lb

## 2018-02-20 DIAGNOSIS — I714 Abdominal aortic aneurysm, without rupture, unspecified: Secondary | ICD-10-CM

## 2018-02-20 DIAGNOSIS — Z95828 Presence of other vascular implants and grafts: Secondary | ICD-10-CM | POA: Diagnosis not present

## 2018-02-20 NOTE — Patient Instructions (Signed)
Before your next abdominal ultrasound:  Avoid gas forming foods and beverages the day before the test.   Take two Extra-Strength Gas-X capsules at bedtime the night before the test. Take another two Extra-Strength Gas-X capsules in the middle of the night if you get up to the restroom, if not, first thing in the morning with water.  Do not chew gum.     
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
## 2018-03-01 ENCOUNTER — Ambulatory Visit: Payer: Medicare Other | Admitting: Sports Medicine

## 2018-03-02 ENCOUNTER — Other Ambulatory Visit: Payer: Self-pay

## 2018-03-02 MED ORDER — TIOTROPIUM BROMIDE MONOHYDRATE 18 MCG IN CAPS: 18.0000 ug | ORAL_CAPSULE | Freq: Every day | RESPIRATORY_TRACT | 11 refills | Status: DC

## 2018-03-08 ENCOUNTER — Encounter: Payer: Self-pay | Admitting: Sports Medicine

## 2018-03-08 ENCOUNTER — Ambulatory Visit (INDEPENDENT_AMBULATORY_CARE_PROVIDER_SITE_OTHER): Payer: Medicare Other | Admitting: General Practice

## 2018-03-08 ENCOUNTER — Ambulatory Visit (INDEPENDENT_AMBULATORY_CARE_PROVIDER_SITE_OTHER): Payer: Medicare Other | Admitting: Sports Medicine

## 2018-03-08 VITALS — BP 124/80 | HR 64

## 2018-03-08 DIAGNOSIS — M5441 Lumbago with sciatica, right side: Secondary | ICD-10-CM

## 2018-03-08 DIAGNOSIS — M25511 Pain in right shoulder: Secondary | ICD-10-CM | POA: Diagnosis not present

## 2018-03-08 DIAGNOSIS — I482 Chronic atrial fibrillation, unspecified: Secondary | ICD-10-CM

## 2018-03-08 DIAGNOSIS — M1990 Unspecified osteoarthritis, unspecified site: Secondary | ICD-10-CM

## 2018-03-08 DIAGNOSIS — G894 Chronic pain syndrome: Secondary | ICD-10-CM | POA: Diagnosis not present

## 2018-03-08 DIAGNOSIS — Z7901 Long term (current) use of anticoagulants: Secondary | ICD-10-CM

## 2018-03-08 DIAGNOSIS — M5442 Lumbago with sciatica, left side: Secondary | ICD-10-CM

## 2018-03-08 DIAGNOSIS — R262 Difficulty in walking, not elsewhere classified: Secondary | ICD-10-CM

## 2018-03-08 DIAGNOSIS — G8929 Other chronic pain: Secondary | ICD-10-CM

## 2018-03-08 LAB — POCT INR: INR: 2.9 (ref 2.0–3.0)

## 2018-03-08 NOTE — Progress Notes (Signed)
Alejandra [name]. Alejandra [name], [place] [facility] [place]  [employer]KE[employer] DEMICCO - 84 y.o. female MRN [id number]  Date of birth: [birth year]  Visit Date: 03/08/2018  PCP: Vivi Barrack, MD   Referred by: Vivi Barrack, MD  Scribe(s) for today's visit: Josepha Pigg, CMA  SUBJECTIVE:  Alejandra [name] is here for Follow-up (LBP, R shoulder, R thumb)   11/15/2017: Her R hip and R knee pain symptoms INITIALLY: Began 10/29/2017. Dr [name] told her that she has arthritis.  Described as mild aching and pulling (as long as she takes tramadol and tylenol #3), radiating from lower back unto both legs.  Worsened with weight bearing and movement. Improved with resting.  Additional associated symptoms include: She also c/o LBP and pain in the L leg.    At this time symptoms show no change compared to onset  She has been taking Tylenol and Tramadol prn. She received IM Depo 80 injection 11/03/17 and reports minimal relief from this.  Last XR R hip 10/31/17.   12/01/2017: Compared to the last office visit, her previously described symptoms show little change. She continues to have sciatica, R side > L side.  Current symptoms are mild & are radiating to both legs.  She has been taking Gabapentin and feels that this has helped at nighttime. She feels like she is able to straighten her leg a little more easily. She also has Tramadol and Tylenol #3 which provides some relief.  She had ABI done 11/18/2017.  She c/o neck pain that radiates into the shoulders and upper back.   06/19/19L Compared to the last office visit on 12/01/17, her previously described LBP, neck pain and R shoulder pain symptoms show no change except for the R shoulder which improved after she had an injection at her last visit. Current symptoms are moderate & are radiating to B LEs R>L. She has been taking Gabapentin,Tylenol #3.  She was referred to pain management and for home health PT at her  last visit.  She also had a R shoulder injection on 12/01/17. Her R thumb (1st MCP joint) symptoms INITIALLY: Began unknown timeframe w/ no specific MOI Described as mild-mod aching pain, radiating to R forearm Worsened with hitting the thumb Improved with nothing noted Additional associated symptoms include: no N/T noted in the R thumb or fingers   At this time symptoms are worsening compared to onset with increased pain and swelling in the R 1st MCP joint. She has been taking her medications for previously treated conditions as noted above.  03/08/2018: LBP -  Compared to the last office visit, her previously described symptoms show no change. She reports that steroid dosepack to help with her leg pain.  XR L-spine 11/15/2017  R shoulder -  Compared to the last office visit, her previously described symptoms show no change. She not c/o pain in the L shoulder as well. Pain radiates down both arms toward the elbows.  C c-spine 02/02/2017 R shoulder inj 12/01/2017  R thumb -  Compared to the last office visit, her previously described symptoms show no change. She denies increased swelling around the joint.   She continues taking Gabapentin and Tylenol #3. She completed steroid dose pack. Last A1C 09/28/17 was 6.1.  She has been seeing Dr. Royce Macadamia for pain management -[facility]).  She reports that she has still not been contacted by home health for PT.     REVIEW  OF SYSTEMS: Denies night time disturbances. Denies fevers, chills, or night sweats. Denies unexplained weight loss. Denies personal history of cancer. Denies changes in bowel or bladder habits. Denies recent unreported falls. Denies new or worsening dyspnea or wheezing. Denies headaches or dizziness.  Denies numbness, tingling or weakness  In the extremities.  Denies dizziness or presyncopal episodes Denies lower extremity edema    HISTORY & PERTINENT PRIOR DATA:  Significant/pertinent  history, findings, studies include:  reports that she quit smoking about 8 years ago. Her smoking use included cigarettes. She has a 27.00 pack-year smoking history. She has never used smokeless tobacco. Recent Labs    09/28/17 1129  HGBA1C 6.1   No specialty comments available. No problems updated.  Otherwise prior history reviewed and updated per electronic medical record.    OBJECTIVE:  VS:  HT:    WT:   BMI:     BP:124/80  HR:64bpm  TEMP: ( )  RESP:    PHYSICAL EXAM: CONSTITUTIONAL: Elderly, frail, female on oxygen in a wheelchair. Alert & appropriately interactive. and Not depressed or anxious appearing. RESPIRATORY: No increased work of breathing, Trachea Midline and On oxygen, nasal cannula EYES: Pupils are equal., EOM intact without nystagmus. and No scleral icterus.  Upper and Lower extremities: Warm and well perfused NEURO: unremarkable Generalized weakness without focality on manual muscle testing..  She has a generalized effusion in both bilateral upper and lower extremities.  No focal locking sensation.  MSK Exam: : . Bilateral shoulders are held in protraction.  She has restricted internal and external range of motion bilaterally worse on the left than the right.  Passively she allows me to move her through a full range without significant pain.  Internal rotation and external rotation strength bilaterally is maintained.  She has no pain with Spurling's compression test and Lhermitte's compression test although a anterior head forward carriage and poor cervical sidebending and rotation.  She has bilateral negative straight leg raises with well-maintained internal and external rotation of her hips.   PROCEDURES & DATA REVIEWED:  None  ASSESSMENT   1. Bilateral low back pain with bilateral sciatica, unspecified chronicity   2. Osteoarthritis, unspecified osteoarthritis type, unspecified site   3. Chronic right shoulder pain   4. Chronic pain syndrome   5.  Difficulty walking     PLAN:    Will be in touch regarding referral to home health physical therapy once again.  We will contact them directly.  He has not heard anything order this is not set up please call our office by next week.      Ultimately she is quite deconditioned and having multiple polyarthralgia complaints that are difficult to fully ascertain as to where they are coming from but it is likely related to deconditioning more so than anything at this time.  She has no focal motor weakness on exam but does have underlying generalized osteoarthritic changes of her cervical and lumbar spine as evidenced per prior x-rays.  At this point I would like to ensure that home health physical therapy is set up and we will plan to have her follow-up in 8 weeks and check on her progress at that time.  If any focal single joint complaints we can always further investigate this further with the generalized pain and discomfort this is more of the chronic pain syndrome and deconditioning that is the underlying issue for her.  Can consider intra-articular shoulder injections if persistent right arm/shoulder pain. Can also consider additional diagnostic  work-up of the cervical lumbar spine for consideration of epidural steroid injections which are now able to be performed on anticoagulation for lumbar interlaminar per most recent study and could be considered if any lack of improvement especially from an ambulation standpoint.   .  No problem-specific Assessment & Plan notes found for this encounter.  Follow-up: Return in about 8 weeks (around 05/03/2018), or if symptoms worsen or fail to improve.      Please see additional documentation for Objective, Assessment and Plan sections. Pertinent additional documentation may be included in corresponding procedure notes, imaging studies, problem based documentation and patient instructions. Please see these sections of the encounter for additional information  regarding this visit.  CMA/ATC served as Education administrator during this visit. History, Physical, and Plan performed by medical provider. Documentation and orders reviewed and attested to.      Gerda Diss, [facility] Physician

## 2018-03-08 NOTE — Patient Instructions (Addendum)
Pre visit review using our clinic review tool, if applicable. No additional management support is needed unless otherwise documented below in the visit note.  Continue to take 1 tablet daily except 1 1/2 tablets on Monday and Friday.  Re-check in 4 weeks.

## 2018-03-09 ENCOUNTER — Other Ambulatory Visit: Payer: Self-pay

## 2018-03-09 NOTE — Addendum Note (Signed)
Addended by: Jasper Loser on: 03/09/2018 11:24 AM   Modules accepted: Orders

## 2018-03-14 ENCOUNTER — Telehealth: Payer: Self-pay | Admitting: Family Medicine

## 2018-03-14 NOTE — Telephone Encounter (Signed)
See note

## 2018-03-14 NOTE — Telephone Encounter (Signed)
Copied from [place] [id number]. Topic: Quick Communication - See Telephone Encounter >> Mar 14, 2018 10:27 AM Synthia Innocent wrote: CRM for notification. See Telephone encounter for: 03/14/18. [facility] Requesting verbal orders for PT 2x a week for 3 weeks

## 2018-03-15 LAB — CUP PACEART REMOTE DEVICE CHECK
Battery Remaining Percentage: 95.5 %
Brady Statistic AP VS Percent: 1.6 %
Brady Statistic AS VP Percent: 1 %
Brady Statistic RV Percent Paced: 1 %
Date Time Interrogation Session: 20190722194419
Implantable Lead Location: 753860
Lead Channel Impedance Value: 480 Ohm
Lead Channel Pacing Threshold Amplitude: 0.5 V
Lead Channel Sensing Intrinsic Amplitude: 9.6 mV
Lead Channel Setting Pacing Amplitude: 2 V
Lead Channel Setting Pacing Pulse Width: 0.5 ms
Lead Channel Setting Sensing Sensitivity: 2 mV
MDC IDC LEAD IMPLANT DT: 20180720
MDC IDC LEAD IMPLANT DT: 20180720
MDC IDC LEAD LOCATION: 753859
MDC IDC MSMT BATTERY REMAINING LONGEVITY: 114 mo
MDC IDC MSMT BATTERY VOLTAGE: 2.99 V
MDC IDC MSMT LEADCHNL RA IMPEDANCE VALUE: 390 Ohm
MDC IDC MSMT LEADCHNL RA PACING THRESHOLD AMPLITUDE: 0.75 V
MDC IDC MSMT LEADCHNL RA PACING THRESHOLD PULSEWIDTH: 0.5 ms
MDC IDC MSMT LEADCHNL RA SENSING INTR AMPL: 4.8 mV
MDC IDC MSMT LEADCHNL RV PACING THRESHOLD PULSEWIDTH: 0.5 ms
MDC IDC PG IMPLANT DT: 20180720
MDC IDC SET LEADCHNL RV PACING AMPLITUDE: 2.5 V
MDC IDC STAT BRADY AP VP PERCENT: 1 %
MDC IDC STAT BRADY AS VS PERCENT: 98 %
MDC IDC STAT BRADY RA PERCENT PACED: 1.2 %
Pulse Gen Model: 2272
Pulse Gen Serial Number: 8917538

## 2018-03-15 NOTE — Telephone Encounter (Signed)
Spoke with [place] from [facility] and gave verbal orders.

## 2018-03-25 ENCOUNTER — Other Ambulatory Visit: Payer: Self-pay | Admitting: Family Medicine

## 2018-04-05 ENCOUNTER — Ambulatory Visit (INDEPENDENT_AMBULATORY_CARE_PROVIDER_SITE_OTHER): Payer: Medicare Other | Admitting: General Practice

## 2018-04-05 ENCOUNTER — Ambulatory Visit (INDEPENDENT_AMBULATORY_CARE_PROVIDER_SITE_OTHER): Payer: Medicare Other

## 2018-04-05 ENCOUNTER — Ambulatory Visit: Payer: Medicare Other | Admitting: Family Medicine

## 2018-04-05 ENCOUNTER — Ambulatory Visit (INDEPENDENT_AMBULATORY_CARE_PROVIDER_SITE_OTHER): Payer: Medicare Other | Admitting: Family Medicine

## 2018-04-05 ENCOUNTER — Telehealth: Payer: Self-pay | Admitting: Family Medicine

## 2018-04-05 ENCOUNTER — Encounter: Payer: Self-pay | Admitting: Family Medicine

## 2018-04-05 VITALS — BP 120/74 | HR 72 | Temp 98.6°F | Ht 65.0 in

## 2018-04-05 DIAGNOSIS — E1159 Type 2 diabetes mellitus with other circulatory complications: Secondary | ICD-10-CM

## 2018-04-05 DIAGNOSIS — E1122 Type 2 diabetes mellitus with diabetic chronic kidney disease: Secondary | ICD-10-CM

## 2018-04-05 DIAGNOSIS — R05 Cough: Secondary | ICD-10-CM

## 2018-04-05 DIAGNOSIS — I482 Chronic atrial fibrillation, unspecified: Secondary | ICD-10-CM

## 2018-04-05 DIAGNOSIS — R059 Cough, unspecified: Secondary | ICD-10-CM

## 2018-04-05 DIAGNOSIS — N183 Chronic kidney disease, stage 3 (moderate): Secondary | ICD-10-CM | POA: Diagnosis not present

## 2018-04-05 DIAGNOSIS — R112 Nausea with vomiting, unspecified: Secondary | ICD-10-CM

## 2018-04-05 DIAGNOSIS — I152 Hypertension secondary to endocrine disorders: Secondary | ICD-10-CM

## 2018-04-05 DIAGNOSIS — J432 Centrilobular emphysema: Secondary | ICD-10-CM

## 2018-04-05 DIAGNOSIS — Z7901 Long term (current) use of anticoagulants: Secondary | ICD-10-CM

## 2018-04-05 DIAGNOSIS — I10 Essential (primary) hypertension: Secondary | ICD-10-CM

## 2018-04-05 LAB — COMPREHENSIVE METABOLIC PANEL
ALBUMIN: 4.2 g/dL (ref 3.5–5.2)
ALT: 11 U/L (ref 0–35)
AST: 15 U/L (ref 0–37)
Alkaline Phosphatase: 40 U/L (ref 39–117)
BUN: 24 mg/dL — ABNORMAL HIGH (ref 6–23)
CALCIUM: 10 mg/dL (ref 8.4–10.5)
CHLORIDE: 99 meq/L (ref 96–112)
CO2: 33 mEq/L — ABNORMAL HIGH (ref 19–32)
Creatinine, Ser: 0.96 mg/dL (ref 0.40–1.20)
GFR: 71.18 mL/min (ref >60.00)
Glucose, Bld: 175 mg/dL — ABNORMAL HIGH (ref 70–99)
POTASSIUM: 4.8 meq/L (ref 3.5–5.1)
Sodium: 138 mEq/L (ref 135–145)
Total Bilirubin: 0.3 mg/dL (ref 0.2–1.2)
Total Protein: 8.2 g/dL (ref 6.0–8.3)

## 2018-04-05 LAB — CBC
HEMATOCRIT: 37.2 % (ref 36.0–46.0)
Hemoglobin: 12.5 g/dL (ref 12.0–15.0)
MCHC: 33.5 g/dL (ref 30.0–36.0)
MCV: 81.4 fl (ref 78.0–100.0)
PLATELETS: 230 10*3/uL (ref 150.0–400.0)
RBC: 4.57 Mil/uL (ref 3.87–5.11)
RDW: 14.9 % (ref 11.5–15.5)
WBC: 5.8 10*3/uL (ref 4.0–10.5)

## 2018-04-05 LAB — HEMOGLOBIN A1C: HEMOGLOBIN A1C: 6.5 % (ref 4.6–6.5)

## 2018-04-05 LAB — POCT INR: INR: 3.7 — AB (ref 2.0–3.0)

## 2018-04-05 MED ORDER — PREDNISONE 20 MG PO TABS: 20.0000 mg | ORAL_TABLET | Freq: Every day | ORAL | 0 refills | Status: DC

## 2018-04-05 MED ORDER — AZITHROMYCIN 250 MG PO TABS: ORAL_TABLET | ORAL | 0 refills | Status: DC

## 2018-04-05 MED ORDER — ONDANSETRON 4 MG PO TBDP: 4.0000 mg | ORAL_TABLET | Freq: Three times a day (TID) | ORAL | 0 refills | Status: DC | PRN

## 2018-04-05 MED ORDER — ONDANSETRON HCL 4 MG/2ML IJ SOLN
4.0000 mg | Freq: Once | INTRAMUSCULAR | Status: AC
  Administered 2018-04-05: 4 mg via INTRAMUSCULAR

## 2018-04-05 MED ORDER — TIOTROPIUM BROMIDE MONOHYDRATE 2.5 MCG/ACT IN AERS: 2.0000 | INHALATION_SPRAY | Freq: Every day | RESPIRATORY_TRACT | 11 refills | Status: AC

## 2018-04-05 NOTE — Telephone Encounter (Signed)
See note.   Copied from [place] [id number]. Topic: General - Other >> Apr 05, 2018  4:14 PM Carolyn Stare wrote:  Pt daughter Mateo Flow would like a call back   [phone]

## 2018-04-05 NOTE — Assessment & Plan Note (Signed)
Continue glipizide 5 mg daily and metformin 500 mg twice daily.  Check A1c today.

## 2018-04-05 NOTE — Patient Instructions (Signed)
It was very nice to see you today!  I think you may have a small touch of pneumonia. Please start the azithromycin and prednisone.  Please use the zofran as needed.  Work on taking deep breaths so that your lungs can clear the infection.   Please take the spiriva two puffs daily.  No other medication changes today.  Come back to see me in 6 months, or sooner as needed.   Please go to the ED if you have any worsening symptoms.   Take care, Dr [name]

## 2018-04-05 NOTE — Progress Notes (Signed)
Subjective:  Alejandra [name] is a 84 y.o. female who presents today with a chief complaint of cough.   HPI:  Cough, acute problem Started a week or two ago.  She has had sputum production.  No specific treatments tried.  She is currently taking Symbicort and albuterol.  She has been prescribed Spiriva however has had issues with getting improved with that.  No fevers or chills.  Cough seems to be worsening.  Nausea/vomiting Started this morning.  No abdominal pain.  No diarrhea.  No treatments tried.  No obvious precipitating or triggering events.  No sick contacts.  T2DM On glipizide 5 mg daily and metformin 500 mg twice daily.  Tolerates these well without side effects.  HTN Currently on metoprolol 100 mg daily, losartan 25 mg daily, and Dyazide 37.5-25mg  one capsule daily.  Tolerating all these well without side effects.   ROS: Per HPI  PMH: She reports that she quit smoking about 8 years ago. Her smoking use included cigarettes. She has a 27.00 pack-year smoking history. She has never used smokeless tobacco. She reports that she does not drink alcohol or use drugs.  Objective:  Physical Exam: BP 120/74 (BP Location: Left Arm, Patient Position: Sitting, Cuff Size: Normal)   Pulse 72   Temp 98.6 F (37 C) (Oral)   Ht 5\' 5"  (1.651 m)   SpO2 93%   BMI 23.96 kg/m   Gen: NAD, resting comfortably in wheelchair.  Oxygen in place. CV: RRR with no murmurs appreciated Pulm: NWOB, diffuse scattered wheezes noted throughout all lung fields.  Crackles in left lower lung field. GI: Normal bowel sounds present. Soft, Nontender, Nondistended.   Assessment/Plan:  Cough Chest x-ray with small amount of bibasilar infiltrate based on my read.  May be atelectasis.  Will await radiology read.  Given her symptoms and history of COPD, we will empirically treat for pneumonia.  Her vital signs are stable and she is suitable for outpatient treatment.  Start azithromycin and prednisone.  Check  CBC.  Discussed reasons to return to care.  Discussed reasons to seek emergent care.  Encouraged good oral hydration and deep inspiration.  Nausea Possibly related to possible pneumonia.  Patient was given Zofran in office with modest improvement in her symptoms.  Her abdominal exam is benign she does not have any other red flag signs or symptoms.  Vital signs are stable.  Anticipate improvement as we treat her pneumonia.  We will also give small supply of Zofran for use as needed.  Discussed reasons to seek emergent care.  Type 2 diabetes mellitus with stage 3 chronic kidney disease, without long-term current use of insulin ([name]) Continue glipizide 5 mg daily and metformin 500 mg twice daily.  Check A1c today.  Hypertension associated with diabetes ([place]) At goal on current regimen.  Continue losartan 25 mg daily, metoprolol 100 mg twice daily, and triamterene-HCTZ 37.5-25 once daily.  Check CMET today.  COPD (chronic obstructive pulmonary disease) ([name]) Worse today in setting of possible bronchitis versus pneumonia.  We will continue her Symbicort and albuterol as needed.  Unfortunately, patient was not able to get Spiriva as was prescribed at her last office appointment.  We gave patient Spiriva Respimat sample today.  We will send in prescription for 5 mcg total daily.  If symptoms do not improve on Spiriva and symbicort, would need referral back to pulmonology for further management.  Time Spent: I spent >40 minutes face-to-face with the patient, with more than half spent  on counseling for management plan for her cough/pneymonia, nausea, T2DM, HTN, and COPD.   Algis Greenhouse. [name], MD 04/05/2018 11:49 AM

## 2018-04-05 NOTE — Assessment & Plan Note (Signed)
Worse today in setting of possible bronchitis versus pneumonia.  We will continue her Symbicort and albuterol as needed.  Unfortunately, patient was not able to get Spiriva as was prescribed at her last office appointment.  We gave patient Spiriva Respimat sample today.  We will send in prescription for 5 mcg total daily.  If symptoms do not improve on Spiriva and symbicort, would need referral back to pulmonology for further management.

## 2018-04-05 NOTE — Patient Instructions (Addendum)
Pre visit review using our clinic review tool, if applicable. No additional management support is needed unless otherwise documented below in the visit note.  Hold coumadin today (9/18) and take 1/2 tablet tomorrow (9/19) and then change dosage and take 1 tablet daily except 1 1/2 tablets on Monday.  Re-check in 4 weeks.

## 2018-04-05 NOTE — Assessment & Plan Note (Signed)
At goal on current regimen.  Continue losartan 25 mg daily, metoprolol 100 mg twice daily, and triamterene-HCTZ 37.5-25 once daily.  Check CMET today.

## 2018-04-06 ENCOUNTER — Emergency Department (HOSPITAL_COMMUNITY): Payer: Medicare Other

## 2018-04-06 ENCOUNTER — Observation Stay (HOSPITAL_COMMUNITY)
Admission: EM | Admit: 2018-04-06 | Discharge: 2018-04-08 | Disposition: A | Discharge status: Home / Self Care | Payer: Medicare Other | Attending: Internal Medicine | Admitting: Internal Medicine

## 2018-04-06 ENCOUNTER — Encounter (HOSPITAL_COMMUNITY): Payer: Self-pay | Admitting: Emergency Medicine

## 2018-04-06 ENCOUNTER — Other Ambulatory Visit: Payer: Self-pay

## 2018-04-06 DIAGNOSIS — Z7984 Long term (current) use of oral hypoglycemic drugs: Secondary | ICD-10-CM | POA: Insufficient documentation

## 2018-04-06 DIAGNOSIS — J209 Acute bronchitis, unspecified: Secondary | ICD-10-CM | POA: Diagnosis not present

## 2018-04-06 DIAGNOSIS — I129 Hypertensive chronic kidney disease with stage 1 through stage 4 chronic kidney disease, or unspecified chronic kidney disease: Secondary | ICD-10-CM | POA: Diagnosis not present

## 2018-04-06 DIAGNOSIS — E1122 Type 2 diabetes mellitus with diabetic chronic kidney disease: Secondary | ICD-10-CM | POA: Insufficient documentation

## 2018-04-06 DIAGNOSIS — Z87891 Personal history of nicotine dependence: Secondary | ICD-10-CM | POA: Insufficient documentation

## 2018-04-06 DIAGNOSIS — E1169 Type 2 diabetes mellitus with other specified complication: Secondary | ICD-10-CM | POA: Diagnosis present

## 2018-04-06 DIAGNOSIS — Z7982 Long term (current) use of aspirin: Secondary | ICD-10-CM | POA: Diagnosis not present

## 2018-04-06 DIAGNOSIS — R079 Chest pain, unspecified: Secondary | ICD-10-CM | POA: Diagnosis not present

## 2018-04-06 DIAGNOSIS — I152 Hypertension secondary to endocrine disorders: Secondary | ICD-10-CM | POA: Diagnosis present

## 2018-04-06 DIAGNOSIS — E785 Hyperlipidemia, unspecified: Secondary | ICD-10-CM | POA: Insufficient documentation

## 2018-04-06 DIAGNOSIS — J449 Chronic obstructive pulmonary disease, unspecified: Secondary | ICD-10-CM

## 2018-04-06 DIAGNOSIS — N183 Chronic kidney disease, stage 3 unspecified: Secondary | ICD-10-CM | POA: Diagnosis present

## 2018-04-06 DIAGNOSIS — I482 Chronic atrial fibrillation, unspecified: Secondary | ICD-10-CM | POA: Diagnosis present

## 2018-04-06 DIAGNOSIS — Z79899 Other long term (current) drug therapy: Secondary | ICD-10-CM | POA: Insufficient documentation

## 2018-04-06 DIAGNOSIS — E1159 Type 2 diabetes mellitus with other circulatory complications: Secondary | ICD-10-CM | POA: Diagnosis present

## 2018-04-06 DIAGNOSIS — I10 Essential (primary) hypertension: Secondary | ICD-10-CM

## 2018-04-06 LAB — BASIC METABOLIC PANEL
Anion gap: 14 (ref 5–15)
BUN: 30 mg/dL — AB (ref 8–23)
CO2: 27 mmol/L (ref 22–32)
Calcium: 10.2 mg/dL (ref 8.9–10.3)
Chloride: 98 mmol/L (ref 98–111)
Creatinine, Ser: 1.22 mg/dL — ABNORMAL HIGH (ref 0.44–1.00)
GFR calc Af Amer: 46 mL/min — ABNORMAL LOW (ref >60)
GFR calc non Af Amer: 40 mL/min — ABNORMAL LOW (ref >60)
GLUCOSE: 187 mg/dL — AB (ref 70–99)
Potassium: 5 mmol/L (ref 3.5–5.1)
Sodium: 139 mmol/L (ref 135–145)

## 2018-04-06 LAB — CBC
HCT: 37.8 % (ref 36.0–46.0)
HEMOGLOBIN: 12.8 g/dL (ref 12.0–15.0)
MCH: 26.9 pg (ref 26.0–34.0)
MCHC: 33.9 g/dL (ref 30.0–36.0)
MCV: 79.6 fL (ref 78.0–100.0)
Platelets: 270 10*3/uL (ref 150–400)
RBC: 4.75 MIL/uL (ref 3.87–5.11)
RDW: 14.3 % (ref 11.5–15.5)
WBC: 6.8 10*3/uL (ref 4.0–10.5)

## 2018-04-06 LAB — PROTIME-INR
INR: 2.52
PROTHROMBIN TIME: 27 s — AB (ref 11.4–15.2)

## 2018-04-06 LAB — I-STAT TROPONIN, ED: TROPONIN I, POC: 0 ng/mL (ref 0.00–0.08)

## 2018-04-06 NOTE — Progress Notes (Signed)
Please inform patient of the following:  Her blood counts, electrolytes, kidney function, and liver function are stable. Her blood sugar level is stable.  Do not need to make any changes to her treatment plan at this time. Would like to see her back in 3-6 months, or sooner if she is still having problems with her COPD

## 2018-04-06 NOTE — ED Triage Notes (Signed)
Patient reports left chest pain with SOB , productive cough and emesis today , diagnosed with pneumonia by her PCP yesterday , denies fever or chills .

## 2018-04-07 ENCOUNTER — Emergency Department (HOSPITAL_COMMUNITY): Payer: Medicare Other

## 2018-04-07 ENCOUNTER — Encounter (HOSPITAL_COMMUNITY): Payer: Self-pay | Admitting: Emergency Medicine

## 2018-04-07 DIAGNOSIS — E1122 Type 2 diabetes mellitus with diabetic chronic kidney disease: Secondary | ICD-10-CM | POA: Diagnosis not present

## 2018-04-07 DIAGNOSIS — I10 Essential (primary) hypertension: Secondary | ICD-10-CM

## 2018-04-07 DIAGNOSIS — E785 Hyperlipidemia, unspecified: Secondary | ICD-10-CM

## 2018-04-07 DIAGNOSIS — R079 Chest pain, unspecified: Secondary | ICD-10-CM

## 2018-04-07 DIAGNOSIS — N183 Chronic kidney disease, stage 3 (moderate): Secondary | ICD-10-CM

## 2018-04-07 DIAGNOSIS — E1169 Type 2 diabetes mellitus with other specified complication: Secondary | ICD-10-CM

## 2018-04-07 DIAGNOSIS — R0789 Other chest pain: Secondary | ICD-10-CM | POA: Diagnosis not present

## 2018-04-07 DIAGNOSIS — I482 Chronic atrial fibrillation: Secondary | ICD-10-CM

## 2018-04-07 DIAGNOSIS — J440 Chronic obstructive pulmonary disease with acute lower respiratory infection: Secondary | ICD-10-CM

## 2018-04-07 DIAGNOSIS — J209 Acute bronchitis, unspecified: Secondary | ICD-10-CM

## 2018-04-07 DIAGNOSIS — E1159 Type 2 diabetes mellitus with other circulatory complications: Secondary | ICD-10-CM

## 2018-04-07 DIAGNOSIS — N179 Acute kidney failure, unspecified: Secondary | ICD-10-CM

## 2018-04-07 LAB — TROPONIN I
Troponin I: <0.03 ng/mL (ref <0.03)
Troponin I: <0.03 ng/mL (ref <0.03)

## 2018-04-07 LAB — GLUCOSE, CAPILLARY
GLUCOSE-CAPILLARY: 153 mg/dL — AB (ref 70–99)
Glucose-Capillary: 166 mg/dL — ABNORMAL HIGH (ref 70–99)
Glucose-Capillary: 179 mg/dL — ABNORMAL HIGH (ref 70–99)

## 2018-04-07 LAB — MRSA PCR SCREENING: MRSA by PCR: NEGATIVE

## 2018-04-07 LAB — D-DIMER, QUANTITATIVE: D-Dimer, Quant: 0.79 ug/mL-FEU — ABNORMAL HIGH (ref 0.00–0.50)

## 2018-04-07 MED ORDER — VITAMIN D 1000 UNITS PO TABS
1000.0000 [IU] | ORAL_TABLET | Freq: Every day | ORAL | Status: DC
  Administered 2018-04-07 – 2018-04-08 (×2): 1000 [IU] via ORAL
  Filled 2018-04-07 (×2): qty 1

## 2018-04-07 MED ORDER — GI COCKTAIL ~~LOC~~: 30.0000 mL | Freq: Four times a day (QID) | ORAL | Status: DC | PRN

## 2018-04-07 MED ORDER — ONDANSETRON HCL 4 MG/2ML IJ SOLN: 4.0000 mg | Freq: Four times a day (QID) | INTRAMUSCULAR | Status: DC | PRN

## 2018-04-07 MED ORDER — ACETAMINOPHEN 500 MG PO TABS: 1000.0000 mg | ORAL_TABLET | Freq: Two times a day (BID) | ORAL | Status: DC | PRN

## 2018-04-07 MED ORDER — ACETAMINOPHEN 325 MG PO TABS: 650.0000 mg | ORAL_TABLET | ORAL | Status: DC | PRN

## 2018-04-07 MED ORDER — IOPAMIDOL (ISOVUE-370) INJECTION 76%
INTRAVENOUS | Status: AC
  Filled 2018-04-07: qty 100

## 2018-04-07 MED ORDER — METOPROLOL TARTRATE 25 MG PO TABS
100.0000 mg | ORAL_TABLET | Freq: Two times a day (BID) | ORAL | Status: DC
  Administered 2018-04-07 – 2018-04-08 (×3): 100 mg via ORAL
  Filled 2018-04-07 (×3): qty 4

## 2018-04-07 MED ORDER — KETOROLAC TROMETHAMINE 30 MG/ML IJ SOLN
15.0000 mg | Freq: Once | INTRAMUSCULAR | Status: AC
  Administered 2018-04-07: 15 mg via INTRAVENOUS
  Filled 2018-04-07: qty 1

## 2018-04-07 MED ORDER — ADULT MULTIVITAMIN W/MINERALS CH
1.0000 | ORAL_TABLET | Freq: Every day | ORAL | Status: DC
  Administered 2018-04-07 – 2018-04-08 (×2): 1 via ORAL
  Filled 2018-04-07 (×2): qty 1

## 2018-04-07 MED ORDER — DM-GUAIFENESIN ER 30-600 MG PO TB12
1.0000 | ORAL_TABLET | Freq: Two times a day (BID) | ORAL | Status: DC
  Administered 2018-04-07 – 2018-04-08 (×3): 1 via ORAL
  Filled 2018-04-07 (×3): qty 1

## 2018-04-07 MED ORDER — IPRATROPIUM-ALBUTEROL 0.5-2.5 (3) MG/3ML IN SOLN
3.0000 mL | Freq: Four times a day (QID) | RESPIRATORY_TRACT | Status: DC
  Administered 2018-04-07 (×3): 3 mL via RESPIRATORY_TRACT
  Filled 2018-04-07 (×3): qty 3

## 2018-04-07 MED ORDER — ADAPALENE 0.1 % EX GEL: Freq: Every day | CUTANEOUS | Status: DC

## 2018-04-07 MED ORDER — INSULIN DETEMIR 100 UNIT/ML ~~LOC~~ SOLN
5.0000 [IU] | Freq: Every day | SUBCUTANEOUS | Status: DC
  Administered 2018-04-07: 5 [IU] via SUBCUTANEOUS
  Filled 2018-04-07 (×2): qty 0.05

## 2018-04-07 MED ORDER — ASPIRIN 81 MG PO CHEW
81.0000 mg | CHEWABLE_TABLET | Freq: Every day | ORAL | Status: DC
  Administered 2018-04-07 – 2018-04-08 (×2): 81 mg via ORAL
  Filled 2018-04-07 (×2): qty 1

## 2018-04-07 MED ORDER — TRIAMCINOLONE ACETONIDE 0.5 % EX OINT
1.0000 "application " | TOPICAL_OINTMENT | Freq: Two times a day (BID) | CUTANEOUS | Status: DC
  Administered 2018-04-07 – 2018-04-08 (×3): 1 via TOPICAL
  Filled 2018-04-07: qty 15

## 2018-04-07 MED ORDER — DOCUSATE SODIUM 100 MG PO CAPS
100.0000 mg | ORAL_CAPSULE | Freq: Every day | ORAL | Status: DC | PRN
  Administered 2018-04-07: 100 mg via ORAL
  Filled 2018-04-07: qty 1

## 2018-04-07 MED ORDER — SODIUM CHLORIDE 0.9 % IV SOLN
INTRAVENOUS | Status: AC
  Administered 2018-04-07 (×2): via INTRAVENOUS

## 2018-04-07 MED ORDER — INSULIN ASPART 100 UNIT/ML ~~LOC~~ SOLN
0.0000 [IU] | Freq: Three times a day (TID) | SUBCUTANEOUS | Status: DC
  Administered 2018-04-07 (×2): 2 [IU] via SUBCUTANEOUS
  Administered 2018-04-08: 1 [IU] via SUBCUTANEOUS
  Administered 2018-04-08: 3 [IU] via SUBCUTANEOUS

## 2018-04-07 MED ORDER — GABAPENTIN 100 MG PO CAPS
100.0000 mg | ORAL_CAPSULE | Freq: Two times a day (BID) | ORAL | Status: DC
  Administered 2018-04-07 – 2018-04-08 (×3): 100 mg via ORAL
  Filled 2018-04-07 (×3): qty 1

## 2018-04-07 MED ORDER — PREDNISONE 20 MG PO TABS
30.0000 mg | ORAL_TABLET | Freq: Every day | ORAL | Status: DC
  Administered 2018-04-07 – 2018-04-08 (×2): 30 mg via ORAL
  Filled 2018-04-07 (×2): qty 2

## 2018-04-07 MED ORDER — PANTOPRAZOLE SODIUM 40 MG PO TBEC
40.0000 mg | DELAYED_RELEASE_TABLET | Freq: Every day | ORAL | Status: DC
  Administered 2018-04-07 – 2018-04-08 (×2): 40 mg via ORAL
  Filled 2018-04-07 (×2): qty 1

## 2018-04-07 MED ORDER — MORPHINE SULFATE (PF) 2 MG/ML IV SOLN: 1.0000 mg | INTRAVENOUS | Status: DC | PRN

## 2018-04-07 MED ORDER — FENTANYL CITRATE (PF) 100 MCG/2ML IJ SOLN
50.0000 ug | Freq: Once | INTRAMUSCULAR | Status: AC
  Administered 2018-04-07: 50 ug via INTRAVENOUS
  Filled 2018-04-07: qty 2

## 2018-04-07 MED ORDER — BUDESONIDE 0.5 MG/2ML IN SUSP
0.5000 mg | Freq: Two times a day (BID) | RESPIRATORY_TRACT | Status: DC
  Administered 2018-04-07 – 2018-04-08 (×3): 0.5 mg via RESPIRATORY_TRACT
  Filled 2018-04-07 (×3): qty 2

## 2018-04-07 MED ORDER — PRAVASTATIN SODIUM 40 MG PO TABS
20.0000 mg | ORAL_TABLET | Freq: Every day | ORAL | Status: DC
  Administered 2018-04-07 – 2018-04-08 (×2): 20 mg via ORAL
  Filled 2018-04-07 (×2): qty 1

## 2018-04-07 MED ORDER — DOXYCYCLINE HYCLATE 100 MG PO TABS
100.0000 mg | ORAL_TABLET | Freq: Two times a day (BID) | ORAL | Status: DC
  Administered 2018-04-07 – 2018-04-08 (×3): 100 mg via ORAL
  Filled 2018-04-07 (×3): qty 1

## 2018-04-07 MED ORDER — OMEGA-3-ACID ETHYL ESTERS 1 G PO CAPS
2.0000 g | ORAL_CAPSULE | Freq: Two times a day (BID) | ORAL | Status: DC
  Administered 2018-04-07 – 2018-04-08 (×3): 2 g via ORAL
  Filled 2018-04-07 (×3): qty 2

## 2018-04-07 NOTE — ED Notes (Signed)
IV team had no success with IV.

## 2018-04-07 NOTE — Care Management Obs Status (Signed)
[facility] NOTIFICATION   Patient Details  Name: Alejandra [name] MRN: [id number] Date of Birth: 05/17/1932   Medicare Observation Status Notification Given:  Yes    Bethena Roys, RN 04/07/2018, 2:35 PM

## 2018-04-07 NOTE — H&P (Signed)
History and Physical    Alejandra [name] QMV:[id number] DOB: Jun 08, 1932 DOA: 04/06/2018  Referring MD/NP/PA: Dr. Randal Buba PCP: Vivi Barrack, MD  Patient coming from: Home  Chief Complaint: Chest pain, shortness of breath, productive cough.  HPI: Alejandra [name] is a 84 y.o. female with a past medical history significant for COPD (with chronic respiratory failure using 2 L nasal cannula at baseline), type 2 diabetes mellitus with nephropathy, mixed hyperlipidemia, hypertension, chronic kidney disease is stage III, atrial fibrillation (chronically on Coumadin) and history of pulmonary embolism; who presented to the hospital secondary to episode of left costal chest discomfort.  The pain has been present for over 15 hours now, is non-radiated, dull in nature, 5-6 out of 10 in intensity and according to patient is worse with movement and when coughing.  She has some associated shortness of breath and reports an episode of vomiting the night prior to admission.  According to family patient has been for the last week or so experiencing URI symptoms and so her PCP on 9/18; she was diagnosed with a COPD with acute bronchitis and was started on prednisone and Zithromax.  Symptoms failed to demonstrate any improvement.  Patient also expressed having some anorexia. Patient denies fever, chills, abdominal pain, nausea, dysuria, hematuria, melena, hematochezia, hematemesis, headaches, focal weakness or any other complaints.  In the ED chest x-ray demonstrated no acute cardiopulmonary process; but shoed bronchitic changes.  EKG without acute ischemic abnormalities and troponin x1-.  Patient received 1 dose of fentanyl and 1 dose of Toradol with some improvement in her discomfort.  Given risk factors and a heart score of 4 TRH has been called to place patient in observation for further evaluation and management.  Past Medical/Surgical History: Past Medical History:  Diagnosis Date  . AAA (abdominal aortic  aneurysm) ([place])   . Arthritis   . COPD (chronic obstructive pulmonary disease) ([place])   . DM (diabetes mellitus) ([place])   . Dyslipidemia   . Hyperlipidemia   . Hypertension   . Left foot infection 2013  . 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 2013  . Pulmonary embolism [facility])     Past Surgical History:  Procedure Laterality Date  . ABDOMINAL AORTIC ANEURYSM REPAIR  10-30-2011  . CHOLECYSTECTOMY N/A 10/14/2016   Procedure: LAPAROSCOPIC CHOLECYSTECTOMY;  Surgeon: Clovis Riley, MD;  Location: [place];  Service: General;  Laterality: N/A;  . ENDOVASCULAR STENT INSERTION  10/30/2011   Procedure: ENDOVASCULAR STENT GRAFT INSERTION;  Surgeon: Serafina Mitchell, MD;  Location: [place];  Service: Vascular;  Laterality: N/A;  . PACEMAKER IMPLANT N/A 02/04/2017   Procedure: Pacemaker Implant;  Surgeon: Constance Haw, MD;  Location: [place] [facility];  Service: Cardiovascular;  Laterality: N/A;  . VESICOVAGINAL FISTULA CLOSURE W/ TAH      Social History:  reports that she quit smoking about 8 years ago. Her smoking use included cigarettes. She has a 27.00 pack-year smoking history. She has never used smokeless tobacco. She reports that she does not drink alcohol or use drugs.  Allergies: Allergies  Allergen Reactions  . Penicillins Nausea And Vomiting, Rash and Other (See Comments)    Childhood reaction. Has patient had a PCN reaction causing immediate rash, facial/tongue/throat swelling, SOB or lightheadedness with hypotension: Yes Has patient had a PCN reaction causing severe rash involving mucus membranes or skin necrosis: Yes Has patient had a PCN reaction that required hospitalization: Yes Has patient had a PCN reaction occurring within the last 10 years: No If all  of the above answers are "NO", then may proceed with Cephalosporin use.     Family History:  Family History  Problem Relation Age of Onset  . Heart disease Mother   . Hypertension Mother   . Heart attack Mother   . Hypertension Father     . Diabetes Father   . Heart attack Father   . Heart disease Sister        x 3  . Hypertension Sister   . Heart attack Sister   . Clotting disorder Daughter   . Clotting disorder Son        multiple sons  . Diabetes Son   . Hypertension Brother     Prior to Admission medications   Medication Sig Start Date End Date Taking? Authorizing Provider  acetaminophen (TYLENOL) 500 MG tablet Take 1,000 mg by mouth 2 (two) times daily as needed for mild pain.   Yes [provider]  acetaminophen-codeine (TYLENOL #3) 300-30 MG tablet TAKE 1 TABLET BY MOUTH EVERY 8 HOURS AS NEEDED FOR SEVERE PAIN Patient taking differently: Take 1 tablet by mouth every 8 (eight) hours as needed for moderate pain.  01/02/18  Yes Vivi Barrack, MD  adapalene (DIFFERIN) 0.1 % gel Apply to scalp psoriasis 1 time daily Patient taking differently: Apply topically at bedtime. Apply to scalp psoriasis 01/10/18  Yes Vivi Barrack, MD  albuterol (PROVENTIL HFA;VENTOLIN HFA) 108 (90 Base) MCG/ACT inhaler Inhale 2 puffs into the lungs every 6 (six) hours as needed for wheezing or shortness of breath. 11/23/17  Yes Vivi Barrack, MD  aspirin 81 MG tablet Take 81 mg by mouth daily.     Yes [provider]  budesonide-formoterol (SYMBICORT) 80-4.5 MCG/ACT inhaler Inhale 2 puffs into the lungs 2 (two) times daily. 11/23/17  Yes Vivi Barrack, MD  Cholecalciferol (VITAMIN D3) 1000 UNITS CAPS Take 1 capsule by mouth daily.     Yes [provider]  gabapentin (NEURONTIN) 100 MG capsule Start with 1 tab po qhs X 1 week, then increase to 1 tab po bid X 1 week then 1 tab po tid prn Patient taking differently: Take 100 mg by mouth 3 (three) times daily as needed (for pain).  11/15/17  Yes Gerda Diss, DO  Garlic 7741 MG CAPS Take 1,000 mg by mouth daily.    Yes [provider]  glipiZIDE (GLUCOTROL) 5 MG tablet TAKE 1 TABLET BY MOUTH TWICE DAILY AS DIRECTED. Patient taking differently: Take 5 mg by  mouth 2 (two) times daily before a meal.  03/27/18  Yes Vivi Barrack, MD  losartan (COZAAR) 25 MG tablet TAKE 1 TABLET BY MOUTH DAILY Patient taking differently: Take 25 mg by mouth daily.  02/15/18  Yes Vivi Barrack, MD  lovastatin (MEVACOR) 20 MG tablet Take 1 tablet (20 mg total) by mouth at bedtime. 09/28/17  Yes Vivi Barrack, MD  metFORMIN (GLUCOPHAGE) 500 MG tablet TAKE 1 TABLET BY MOUTH TWICE DAILY AS DIRECTED Patient taking differently: Take 500 mg by mouth 2 (two) times daily with a meal.  03/27/18  Yes Vivi Barrack, MD  metoprolol tartrate (LOPRESSOR) 100 MG tablet Take 1 tablet (100 mg total) by mouth 2 (two) times daily. 02/08/18 05/09/18 Yes Camnitz, Ocie Doyne, MD  Multiple Vitamin (MULTIVITAMIN) tablet Take 1 tablet by mouth daily.     Yes [provider]  omega-3 acid ethyl esters (LOVAZA) 1 G capsule Take 2 g by mouth 2 (two) times daily.  Yes [provider]  ondansetron (ZOFRAN ODT) 4 MG disintegrating tablet Take 1 tablet (4 mg total) by mouth every 8 (eight) hours as needed for nausea or vomiting. 04/05/18  Yes Vivi Barrack, MD  predniSONE (DELTASONE) 20 MG tablet Take 1 tablet (20 mg total) by mouth daily with breakfast. 04/05/18  Yes Vivi Barrack, MD  Tazarotene 0.1 % FOAM Apply to scalp psoriasis 1 time daily. Patient taking differently: Apply topically daily. Apply to scalp psoriasis 01/04/18  Yes Vivi Barrack, MD  Tiotropium Bromide Monohydrate (SPIRIVA RESPIMAT) 2.5 MCG/ACT AERS Inhale 2 puffs into the lungs daily. 04/05/18  Yes Vivi Barrack, MD  triamcinolone ointment (KENALOG) 0.5 % Apply 1 application topically 2 (two) times daily. 01/04/18  Yes Vivi Barrack, MD  triamterene-hydrochlorothiazide (DYAZIDE) 37.5-25 MG capsule Take 1 each (1 capsule total) by mouth daily. 02/16/18  Yes Vivi Barrack, MD  warfarin (COUMADIN) 5 MG tablet Take 5-7.5 mg by mouth See admin instructions. Take 1 and 1/2 tablets on Monday then take 1 tablet the  other days   Yes [provider]    Review of Systems:  Negative except as otherwise mentioned in HPI.   Physical Exam: Vitals:   04/07/18 0600 04/07/18 0715 04/07/18 0745 04/07/18 0821  BP: (!) 156/90 (!) 158/82 (!) 150/93 (!) 147/77  Pulse: 92 92 98 (!) 104  Resp:  16 16   Temp:    98.3 F (36.8 C)  TempSrc:    Oral  SpO2: 98% 98% 99% 95%    Constitutional: Patient reports mild active discomfort in her left costal area under her breasts.  No nausea, no vomiting.  Positive shortness of breath and productive coughing spells.  Wearing 2 L nasal cannula oxygen supplementation.  Eyes: PERRL, lids and conjunctivae normal, no icterus, no nystagmus. ENMT: Mucous membranes are moist. Posterior pharynx clear of any exudate or lesions. Neck: normal, supple, no masses, no thyromegaly, no JVD Respiratory: No using accessory muscles, positive expiratory wheezing and scattered rhonchi; no frank crackles appreciated on exam. Cardiovascular: S1 and S2, no rubs, no gallops; soft systolic ejection murmur appreciated on exam.  Tender to palpation of left costal area on the right breast. Abdomen: no tenderness, no masses palpated. No hepatosplenomegaly. Bowel sounds positive.  Musculoskeletal: no clubbing / cyanosis. No joint deformity upper and lower extremities. Good ROM, no contractures. Normal muscle tone.  Skin: no rashes, lesions, ulcers. No induration or open wounds. Neurologic: CN 2-12 grossly intact. Sensation intact, DTR normal. Strength 5/5 in all 4.  Psychiatric: Normal judgment and insight. Alert and oriented x 3. Normal mood.    Labs on Admission: I have personally reviewed the following labs and imaging studies  CBC: Recent Labs  Lab 04/05/18 1100 04/06/18 2126  WBC 5.8 6.8  HGB 12.5 12.8  HCT 37.2 37.8  MCV 81.4 79.6  PLT 230.0 099   Basic Metabolic Panel: Recent Labs  Lab 04/05/18 1100 04/06/18 2126  NA 138 139  K 4.8 5.0  CL 99 98  CO2 33* 27  GLUCOSE  175* 187*  BUN 24* 30*  CREATININE 0.96 1.22*  CALCIUM 10.0 10.2   GFR: CrCl cannot be calculated (Unknown ideal weight.).   Liver Function Tests: Recent Labs  Lab 04/05/18 1100  AST 15  ALT 11  ALKPHOS 40  BILITOT 0.3  PROT 8.2  ALBUMIN 4.2   Coagulation Profile: Recent Labs  Lab 04/05/18 04/06/18 2126  INR 3.7* 2.52   HbA1C: Recent Labs  04/05/18 1100  HGBA1C 6.5   Urine analysis:    Component Value Date/Time   COLORURINE YELLOW 10/09/2016 0501   APPEARANCEUR CLEAR 10/09/2016 0501   LABSPEC 1.021 10/09/2016 0501   PHURINE 5.0 10/09/2016 0501   GLUCOSEU 50 (A) 10/09/2016 0501   HGBUR NEGATIVE 10/09/2016 0501   BILIRUBINUR NEGATIVE 10/09/2016 0501   KETONESUR NEGATIVE 10/09/2016 0501   PROTEINUR 100 (A) 10/09/2016 0501   UROBILINOGEN 0.2 10/29/2011 1414   NITRITE NEGATIVE 10/09/2016 0501   LEUKOCYTESUR LARGE (A) 10/09/2016 0501    Radiological Exams on Admission: Dg Chest 2 View  Result Date: 04/06/2018 CLINICAL DATA:  Intermittent LEFT chest pain, shortness of breath, cough and vomiting today. Diagnosis with pneumonia yesterday. EXAM: CHEST - 2 VIEW COMPARISON:  Chest radiograph April 05, 2018. FINDINGS: Cardiomediastinal silhouette is normal. Calcified aortic arch. Dual lead LEFT cardiac pacemaker in situ. Minimal LEFT lung base scarring. No pleural effusions or focal consolidations. Trachea projects midline and there is no pneumothorax. Soft tissue planes and included osseous structures are non-suspicious. Osteopenia. IMPRESSION: No acute cardiopulmonary process. Aortic Atherosclerosis (ICD10-I70.0). Electronically Signed   By: Elon Alas M.D.   On: 04/06/2018 22:18   Dg Chest 2 View  Result Date: 04/05/2018 CLINICAL DATA:  Productive cough. EXAM: CHEST - 2 VIEW COMPARISON:  02/05/2017 FINDINGS: Heart size and vascularity are normal. Pacemaker in place. Slight chronic accentuation of the interstitial markings with minimal scarring at the left  lung base laterally. No effusions. No acute bone abnormality. Aortic atherosclerosis. IMPRESSION: No active cardiopulmonary disease. Aortic Atherosclerosis (ICD10-I70.0). Electronically Signed   By: Lorriane Shire M.D.   On: 04/05/2018 15:40    EKG: Independently reviewed.  Left axis deviation, regular rate and sinus rhythm; positive left axis deviation.  No acute ischemic changes.  Assessment/Plan 1-Chest pain: Atypical; the patient with a heart score of 4. -Risk factors include: Hypertension, hyperlipidemia, type 2 diabetes, age. -Initial troponin is negative and her EKG is not demonstrating any acute ischemic changes. -Patient will be admitted to telemetry bed for ACS rule out -Continue beta-blocker and aspirin. -Continue statins -Pending symptoms improvement and further troponin results will determine if cardiology needs to be involved. -her discomfort most likely associated with MSK pain in the setting of coughing spell from COPD with acute bronchitis.  -CXR w/o acute infiltrates.  2-COPD with acute bronchitis ([place]) -continue steroids and use doxycycline -will start pulmicort, duoneb and flutter valve -continue oxygen supplementation. -start mucinex -follow clinical response  3-GERD: -with an episode of N/V prior to admission  -will use PPI -PRN antiemetics ordered.  4-Hypertension -overall stable. -given AKI on CKD will stop cozaar and HCTZ -continue metoprolol -follow VS  5-Hyperlipidemia associated with type 2 diabetes mellitus ([place]) -Check lipid panel -Continue statins.  6-acute kidney injury on CKD stage 3 secondary to diabetes ([place]) -Patient creatinine has increased from baseline. -Most likely associated with continue use of nephrotoxic agents and some prerenal azotemia with dehydration due to poor oral intake and GI losses. -Hold nephrotoxic agents -Gentle fluid resuscitation to be given -Follow renal function trend. -will check UA.  7-Paroxysmal Atrial  fibrillation, chronic ([name]) -rate controlled -continue metoprolol -CHADsVASC score 4 -patient chronically on Coumadin; will continue anticoagulation, dose per pharmacy.  8-Type 2 diabetes mellitus with stage 3 chronic kidney disease, without long-term current use of insulin ([name]) -recent hemoglobin A1c 6.5 -Hold hypoglycemic agents while inpatient -Sliding scale insulin and low-dose Levemir has been ordered -follow CBG's.   9-chronic resp failure with hypoxia -continue oxygen supplementation  -  good O2 sat currently; patient uses 2L [place] chronically.   10-pulmonary embolism -continue coumadin per pharmacy    DVT prophylaxis: Patient chronically on Coumadin. Code Status: DNR Family Communication: Daughter and niece at bedside. Disposition Plan: Anticipate discharge home in less than 48 hours after ruling out ACS and improving respiratory status to continue outpatient treatment for COPD with acute bronchitis. Consults called: None Admission status: Observation, LOS less than 2 midnights, telemetry bed.   Time Spent: 65 minutes  Barton Dubois MD [facility] Pager [phone]  If 7PM-7AM, please contact night-coverage [email or web address] Password [email or web address]  04/07/2018, 8:29 AM

## 2018-04-07 NOTE — ED Notes (Signed)
Iv team at bedside  

## 2018-04-07 NOTE — Progress Notes (Signed)
In Progress  04/07/18 5:01 AM [place], Vida Roller, RN   Call made to ED RN to defer to physician as 2 IV RNs have looked with ultrasound. IV that was obtained to LUA that has infiltrated since insertion. Will offer assistance as needed.

## 2018-04-07 NOTE — Care Management Note (Addendum)
Case Management Note  Patient Details  Name: Alejandra [name] MRN: [id number] Date of Birth: 12/06/1931  Subjective/Objective: Pt presented for Chest Pain. PTA from home with family support. Pt states she has DME02 at [address]. Pt was previously active with [facility] for Services and completed services on 03-30-18.                  Action/Plan: CM will continue to monitor for additional needs.   Expected Discharge Date:                  Expected Discharge Plan:  [place]  In-House Referral:  NA  Discharge planning Services  CM Consult  Post Acute Care Choice:    Choice offered to:     DME Arranged:  N/A DME Agency:  NA  HH Arranged:    [place] Agency:     Status of Service:  In process, will continue to follow  If discussed at Long Length of Stay Meetings, dates discussed:    Additional Comments:  Bethena Roys, RN 04/07/2018, 2:51 PM

## 2018-04-07 NOTE — ED Provider Notes (Signed)
[facility] EMERGENCY DEPARTMENT Provider Note   CSN: [id number] Arrival date & time: 04/06/18  2104     History   Chief Complaint Chief Complaint  Patient presents with  . Chest Pain    Pneumonia    HPI Cristan Hout Brossard is a 84 y.o. female.  The history is provided by the patient.  Chest Pain   This is a new problem. The current episode started 12 to 24 hours ago. The problem occurs constantly. The problem has not changed since onset.The pain is associated with rest. The pain is present in the lateral region. The pain is moderate. The quality of the pain is described as dull. The pain radiates to the upper back. Associated symptoms include shortness of breath. Pertinent negatives include no abdominal pain, no diaphoresis and no palpitations. She has tried nothing for the symptoms. The treatment provided no relief. Risk factors include being elderly.  Pertinent negatives for past medical history include no PE.  Pertinent negatives for family medical history include: no Marfan's syndrome.  Procedure history is negative for cardiac catheterization.    Past Medical History:  Diagnosis Date  . AAA (abdominal aortic aneurysm) ([place])   . Arthritis   . COPD (chronic obstructive pulmonary disease) ([place])   . DM (diabetes mellitus) ([place])   . Dyslipidemia   . Hyperlipidemia   . Hypertension   . Left foot infection 2013  . 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 2013  . Pulmonary embolism [facility])     Patient Active Problem List   Diagnosis Date Noted  . Psoriasis 01/04/2018  . Osteoarthritis 11/03/2017  . Type 2 diabetes mellitus with stage 3 chronic kidney disease, without long-term current use of insulin ([place]) 09/28/2017  . Syncope 02/02/2017  . Atrial fibrillation, chronic ([place])   . Anticoagulated on Coumadin 10/09/2016  . Neck pain 12/11/2015  . CKD stage 3 secondary to diabetes ([place]) 07/08/2015  . Swelling of limb-Right  Leg 11/30/2013  . Aneurysm of abdominal vessel ([place])  11/29/2011  . Hypertension associated with diabetes ([place]) 10/29/2011  . Hyperlipidemia associated with type 2 diabetes mellitus ([place]) 10/29/2011  . UNSPECIFIED VITAMIN D DEFICIENCY 09/21/2010  . COPD (chronic obstructive pulmonary disease) ([place]) 02/13/2010  . PULMONARY NODULE 02/13/2010    Past Surgical History:  Procedure Laterality Date  . ABDOMINAL AORTIC ANEURYSM REPAIR  10-30-2011  . CHOLECYSTECTOMY N/A 10/14/2016   Procedure: LAPAROSCOPIC CHOLECYSTECTOMY;  Surgeon: Clovis Riley, MD;  Location: [facility];  Service: General;  Laterality: N/A;  . ENDOVASCULAR STENT INSERTION  10/30/2011   Procedure: ENDOVASCULAR STENT GRAFT INSERTION;  Surgeon: Serafina Mitchell, MD;  Location: [place];  Service: Vascular;  Laterality: N/A;  . PACEMAKER IMPLANT N/A 02/04/2017   Procedure: Pacemaker Implant;  Surgeon: Constance Haw, MD;  Location: [place] [facility];  Service: Cardiovascular;  Laterality: N/A;  . VESICOVAGINAL FISTULA CLOSURE W/ TAH       OB History   None      Home Medications    Prior to Admission medications   Medication Sig Start Date End Date Taking? Authorizing Provider  acetaminophen (TYLENOL) 500 MG tablet Take 1,000 mg by mouth 2 (two) times daily as needed for mild pain.   Yes [provider]  acetaminophen-codeine (TYLENOL #3) 300-30 MG tablet TAKE 1 TABLET BY MOUTH EVERY 8 HOURS AS NEEDED FOR SEVERE PAIN Patient taking differently: Take 1 tablet by mouth every 8 (eight) hours as needed for moderate pain.  01/02/18  Yes Vivi Barrack,  MD  adapalene (DIFFERIN) 0.1 % gel Apply to scalp psoriasis 1 time daily Patient taking differently: Apply topically at bedtime. Apply to scalp psoriasis 01/10/18  Yes Vivi Barrack, MD  albuterol (PROVENTIL HFA;VENTOLIN HFA) 108 (90 Base) MCG/ACT inhaler Inhale 2 puffs into the lungs every 6 (six) hours as needed for wheezing or shortness of breath. 11/23/17  Yes Vivi Barrack, MD  aspirin 81 MG tablet Take 81 mg by mouth  daily.     Yes [provider]  azithromycin (ZITHROMAX) 250 MG tablet Take 2 tabs day 1, then 1 tab daily Patient taking differently: Take 250-500 mg by mouth See admin instructions. Take 2 tabs day 1, then 1 tab daily 04/05/18  Yes Vivi Barrack, MD  budesonide-formoterol [facility]) 80-4.5 MCG/ACT inhaler Inhale 2 puffs into the lungs 2 (two) times daily. 11/23/17  Yes Vivi Barrack, MD  Cholecalciferol (VITAMIN D3) 1000 UNITS CAPS Take 1 capsule by mouth daily.     Yes [provider]  gabapentin (NEURONTIN) 100 MG capsule Start with 1 tab po qhs X 1 week, then increase to 1 tab po bid X 1 week then 1 tab po tid prn Patient taking differently: Take 100 mg by mouth 3 (three) times daily as needed (for pain).  11/15/17  Yes Gerda Diss, DO  Garlic 2025 MG CAPS Take 1,000 mg by mouth daily.    Yes [provider]  glipiZIDE (GLUCOTROL) 5 MG tablet TAKE 1 TABLET BY MOUTH TWICE DAILY AS DIRECTED. Patient taking differently: Take 5 mg by mouth 2 (two) times daily before a meal.  03/27/18  Yes Vivi Barrack, MD  losartan (COZAAR) 25 MG tablet TAKE 1 TABLET BY MOUTH DAILY Patient taking differently: Take 25 mg by mouth daily.  02/15/18  Yes Vivi Barrack, MD  lovastatin (MEVACOR) 20 MG tablet Take 1 tablet (20 mg total) by mouth at bedtime. 09/28/17  Yes Vivi Barrack, MD  metFORMIN (GLUCOPHAGE) 500 MG tablet TAKE 1 TABLET BY MOUTH TWICE DAILY AS DIRECTED Patient taking differently: Take 500 mg by mouth 2 (two) times daily with a meal.  03/27/18  Yes Vivi Barrack, MD  metoprolol tartrate (LOPRESSOR) 100 MG tablet Take 1 tablet (100 mg total) by mouth 2 (two) times daily. 02/08/18 05/09/18 Yes Camnitz, Ocie Doyne, MD  Multiple Vitamin (MULTIVITAMIN) tablet Take 1 tablet by mouth daily.     Yes [provider]  omega-3 acid ethyl esters (LOVAZA) 1 G capsule Take 2 g by mouth 2 (two) times daily.   Yes [provider]  ondansetron (ZOFRAN ODT) 4 MG  disintegrating tablet Take 1 tablet (4 mg total) by mouth every 8 (eight) hours as needed for nausea or vomiting. 04/05/18  Yes Vivi Barrack, MD  predniSONE (DELTASONE) 20 MG tablet Take 1 tablet (20 mg total) by mouth daily with breakfast. 04/05/18  Yes Vivi Barrack, MD  Tazarotene 0.1 % FOAM Apply to scalp psoriasis 1 time daily. Patient taking differently: Apply topically daily. Apply to scalp psoriasis 01/04/18  Yes Vivi Barrack, MD  Tiotropium Bromide Monohydrate (SPIRIVA RESPIMAT) 2.5 MCG/ACT AERS Inhale 2 puffs into the lungs daily. 04/05/18  Yes Vivi Barrack, MD  triamcinolone ointment (KENALOG) 0.5 % Apply 1 application topically 2 (two) times daily. 01/04/18  Yes Vivi Barrack, MD  triamterene-hydrochlorothiazide (DYAZIDE) 37.5-25 MG capsule Take 1 each (1 capsule total) by mouth daily. 02/16/18  Yes Vivi Barrack, MD  warfarin (COUMADIN) 5  MG tablet Take 5-7.5 mg by mouth See admin instructions. Take 1 and 1/2 tablets on Monday then take 1 tablet the other days   Yes [provider]    Family History Family History  Problem Relation Age of Onset  . Heart disease Mother   . Hypertension Mother   . Heart attack Mother   . Hypertension Father   . Diabetes Father   . Heart attack Father   . Heart disease Sister        x 3  . Hypertension Sister   . Heart attack Sister   . Clotting disorder Daughter   . Clotting disorder Son        multiple sons  . Diabetes Son   . Hypertension Brother     Social History Social History   Tobacco Use  . Smoking status: Former Smoker    Packs/day: 0.50    Years: 54.00    Pack years: 27.00    Types: Cigarettes    Last attempt to quit: 01/23/2010    Years since quitting: 8.2  . Smokeless tobacco: Never Used  Substance Use Topics  . Alcohol use: No    Alcohol/week: 0.0 standard drinks  . Drug use: No     Allergies   Penicillins   Review of Systems Review of Systems  Constitutional: Negative for diaphoresis.    HENT: Negative for drooling.   Respiratory: Positive for shortness of breath.   Cardiovascular: Positive for chest pain. Negative for palpitations and leg swelling.  Gastrointestinal: Negative for abdominal pain.  All other systems reviewed and are negative.    Physical Exam Updated Vital Signs BP (!) 145/78   Pulse 80   Temp 97.8 F (36.6 C)   Resp 16   SpO2 100%   Physical Exam  Constitutional: She is oriented to person, place, and time. She appears well-developed and well-nourished. No distress.  HENT:  Head: Normocephalic and atraumatic.  Mouth/Throat: No oropharyngeal exudate.  Eyes: Pupils are equal, round, and reactive to light. Conjunctivae are normal.  Neck: Normal range of motion. Neck supple.  Cardiovascular: Normal rate, regular rhythm, normal heart sounds and intact distal pulses.  Pulmonary/Chest: Effort normal and breath sounds normal. No stridor. She has no wheezes. She has no rales.  Abdominal: Soft. Bowel sounds are normal. She exhibits no mass. There is no tenderness. There is no rebound and no guarding.  Musculoskeletal: Normal range of motion.  Neurological: She is alert and oriented to person, place, and time.  Skin: Skin is warm and dry. Capillary refill takes less than 2 seconds.  Psychiatric: She has a normal mood and affect.  Nursing note and vitals reviewed.    ED Treatments / Results  Labs (all labs ordered are listed, but only abnormal results are displayed) Results for orders placed or performed during the hospital encounter of 97/98/92  Basic metabolic panel  Result Value Ref Range   Sodium 139 135 - 145 mmol/L   Potassium 5.0 3.5 - 5.1 mmol/L   Chloride 98 98 - 111 mmol/L   CO2 27 22 - 32 mmol/L   Glucose, Bld 187 (H) 70 - 99 mg/dL   BUN 30 (H) 8 - 23 mg/dL   Creatinine, Ser 1.22 (H) 0.44 - 1.00 mg/dL   Calcium 10.2 8.9 - 10.3 mg/dL   GFR calc non Af Amer 40 (L) >60 mL/min   GFR calc Af Amer 46 (L) >60 mL/min   Anion gap 14 5 - 15   CBC  Result Value Ref Range   WBC 6.8 4.0 - 10.5 K/uL   RBC 4.75 3.87 - 5.11 MIL/uL   Hemoglobin 12.8 12.0 - 15.0 g/dL   HCT 37.8 36.0 - 46.0 %   MCV 79.6 78.0 - 100.0 fL   MCH 26.9 26.0 - 34.0 pg   MCHC 33.9 30.0 - 36.0 g/dL   RDW 14.3 11.5 - 15.5 %   Platelets 270 150 - 400 K/uL  Protime-INR (order if Patient is taking Coumadin / Warfarin)  Result Value Ref Range   Prothrombin Time 27.0 (H) 11.4 - 15.2 seconds   INR 2.52   I-stat troponin, ED  Result Value Ref Range   Troponin i, poc 0.00 0.00 - 0.08 ng/mL   Comment 3           Dg Chest 2 View  Result Date: 04/06/2018 CLINICAL DATA:  Intermittent LEFT chest pain, shortness of breath, cough and vomiting today. Diagnosis with pneumonia yesterday. EXAM: CHEST - 2 VIEW COMPARISON:  Chest radiograph April 05, 2018. FINDINGS: Cardiomediastinal silhouette is normal. Calcified aortic arch. Dual lead LEFT cardiac pacemaker in situ. Minimal LEFT lung base scarring. No pleural effusions or focal consolidations. Trachea projects midline and there is no pneumothorax. Soft tissue planes and included osseous structures are non-suspicious. Osteopenia. IMPRESSION: No acute cardiopulmonary process. Aortic Atherosclerosis (ICD10-I70.0). Electronically Signed   By: Elon Alas M.D.   On: 04/06/2018 22:18   Dg Chest 2 View  Result Date: 04/05/2018 CLINICAL DATA:  Productive cough. EXAM: CHEST - 2 VIEW COMPARISON:  02/05/2017 FINDINGS: Heart size and vascularity are normal. Pacemaker in place. Slight chronic accentuation of the interstitial markings with minimal scarring at the left lung base laterally. No effusions. No acute bone abnormality. Aortic atherosclerosis. IMPRESSION: No active cardiopulmonary disease. Aortic Atherosclerosis (ICD10-I70.0). Electronically Signed   By: Lorriane Shire M.D.   On: 04/05/2018 15:40    EKG EKG Interpretation  Date/Time:  Thursday April 06 2018 21:21:55 EDT Ventricular Rate:  95 PR  Interval:  146 QRS Duration: 148 QT Interval:  388 QTC Calculation: 487 R Axis:   -74 Text Interpretation:  Sinus rhythm with Premature atrial complexes Left axis deviation Right bundle branch block Minimal voltage criteria for LVH, may be normal variant Confirmed by Randal Buba, Marchetta Navratil ([id number]) on 04/07/2018 12:25:50 AM   Radiology Dg Chest 2 View  Result Date: 04/06/2018 CLINICAL DATA:  Intermittent LEFT chest pain, shortness of breath, cough and vomiting today. Diagnosis with pneumonia yesterday. EXAM: CHEST - 2 VIEW COMPARISON:  Chest radiograph April 05, 2018. FINDINGS: Cardiomediastinal silhouette is normal. Calcified aortic arch. Dual lead LEFT cardiac pacemaker in situ. Minimal LEFT lung base scarring. No pleural effusions or focal consolidations. Trachea projects midline and there is no pneumothorax. Soft tissue planes and included osseous structures are non-suspicious. Osteopenia. IMPRESSION: No acute cardiopulmonary process. Aortic Atherosclerosis (ICD10-I70.0). Electronically Signed   By: Elon Alas M.D.   On: 04/06/2018 22:18   Dg Chest 2 View  Result Date: 04/05/2018 CLINICAL DATA:  Productive cough. EXAM: CHEST - 2 VIEW COMPARISON:  02/05/2017 FINDINGS: Heart size and vascularity are normal. Pacemaker in place. Slight chronic accentuation of the interstitial markings with minimal scarring at the left lung base laterally. No effusions. No acute bone abnormality. Aortic atherosclerosis. IMPRESSION: No active cardiopulmonary disease. Aortic Atherosclerosis (ICD10-I70.0). Electronically Signed   By: Lorriane Shire M.D.   On: 04/05/2018 15:40    Procedures Procedures (including critical care time)  Medications Ordered in ED Medications  iopamidol (ISOVUE-370) 76 %  injection (has no administration in time range)       Final Clinical Impressions(s) / ED Diagnoses   Final diagnoses:  Chest pain, unspecified type    Given heart score will admit for chest pain rule out    Alexsandro Salek, MD 04/07/18 2330

## 2018-04-08 DIAGNOSIS — I482 Chronic atrial fibrillation: Secondary | ICD-10-CM | POA: Diagnosis not present

## 2018-04-08 DIAGNOSIS — J440 Chronic obstructive pulmonary disease with acute lower respiratory infection: Secondary | ICD-10-CM | POA: Diagnosis not present

## 2018-04-08 DIAGNOSIS — J9611 Chronic respiratory failure with hypoxia: Secondary | ICD-10-CM

## 2018-04-08 DIAGNOSIS — E1122 Type 2 diabetes mellitus with diabetic chronic kidney disease: Secondary | ICD-10-CM | POA: Diagnosis not present

## 2018-04-08 DIAGNOSIS — R079 Chest pain, unspecified: Secondary | ICD-10-CM | POA: Diagnosis not present

## 2018-04-08 DIAGNOSIS — J209 Acute bronchitis, unspecified: Secondary | ICD-10-CM | POA: Diagnosis not present

## 2018-04-08 DIAGNOSIS — I129 Hypertensive chronic kidney disease with stage 1 through stage 4 chronic kidney disease, or unspecified chronic kidney disease: Secondary | ICD-10-CM | POA: Diagnosis not present

## 2018-04-08 DIAGNOSIS — J449 Chronic obstructive pulmonary disease, unspecified: Secondary | ICD-10-CM | POA: Diagnosis not present

## 2018-04-08 LAB — LIPID PANEL
Cholesterol: 115 mg/dL (ref 0–200)
HDL: 48 mg/dL (ref >40)
LDL CALC: 50 mg/dL (ref 0–99)
Total CHOL/HDL Ratio: 2.4 RATIO
Triglycerides: 83 mg/dL (ref <150)
VLDL: 17 mg/dL (ref 0–40)

## 2018-04-08 LAB — GLUCOSE, CAPILLARY
GLUCOSE-CAPILLARY: 147 mg/dL — AB (ref 70–99)
Glucose-Capillary: 86 mg/dL (ref 70–99)
Glucose-Capillary: 217 mg/dL — ABNORMAL HIGH (ref 70–99)

## 2018-04-08 MED ORDER — LOSARTAN POTASSIUM 25 MG PO TABS: 12.5000 mg | ORAL_TABLET | Freq: Every day | ORAL | Status: DC

## 2018-04-08 MED ORDER — PREDNISONE 10 MG PO TABS: 30.0000 mg | ORAL_TABLET | Freq: Every day | ORAL | 0 refills | Status: AC

## 2018-04-08 MED ORDER — METOPROLOL TARTRATE 5 MG/5ML IV SOLN
5.0000 mg | Freq: Once | INTRAVENOUS | Status: DC
  Filled 2018-04-08: qty 5

## 2018-04-08 MED ORDER — DM-GUAIFENESIN ER 30-600 MG PO TB12: 1.0000 | ORAL_TABLET | Freq: Two times a day (BID) | ORAL | 0 refills | Status: AC

## 2018-04-08 MED ORDER — IPRATROPIUM-ALBUTEROL 0.5-2.5 (3) MG/3ML IN SOLN
3.0000 mL | Freq: Three times a day (TID) | RESPIRATORY_TRACT | Status: DC
  Administered 2018-04-08 (×2): 3 mL via RESPIRATORY_TRACT
  Filled 2018-04-08 (×2): qty 3

## 2018-04-08 MED ORDER — PANTOPRAZOLE SODIUM 40 MG PO TBEC: 40.0000 mg | DELAYED_RELEASE_TABLET | Freq: Every day | ORAL | 1 refills | Status: AC

## 2018-04-08 MED ORDER — DOXYCYCLINE HYCLATE 100 MG PO TABS: 100.0000 mg | ORAL_TABLET | Freq: Two times a day (BID) | ORAL | 0 refills | Status: DC

## 2018-04-08 NOTE — Progress Notes (Signed)
Daughter Mateo Flow returned call. Stated "will be there soon or later to pick mother up" Pristine [place]hill Ladora Daniel, BSN, RN

## 2018-04-08 NOTE — Discharge Summary (Signed)
Physician Discharge Summary  Alejandra [name] HEN:[id number] DOB: [birth year] DOA: 04/06/2018  PCP: Alejandra Barrack, MD  Admit date: 04/06/2018 Discharge date: 04/08/2018  Time spent: 35 minutes  Recommendations for Outpatient Follow-up:  1. Repeat basic metabolic panel to follow electrolytes and renal function 2. Reassess blood pressure and further adjust antihypertensive regimen as needed.   Discharge Diagnoses:  Principal Problem:   Chest pain Active Problems:   COPD with acute bronchitis ([name])   Hypertension associated with diabetes ([name])   Hyperlipidemia associated with type 2 diabetes mellitus ([name])   CKD stage 3 secondary to diabetes ([name])   Atrial fibrillation, chronic ([name])   Type 2 diabetes mellitus with stage 3 chronic kidney disease, without long-term current use of insulin ([name])   Discharge Condition: Stable and improved.  Patient has been discharged home with instruction to follow-up with PCP in 2 weeks.  Diet recommendation: Heart healthy modified carbohydrates.  Filed Weights   04/08/18 0443  Weight: 61.1 kg    History of present illness:  84 y.o. female with a past medical history significant for COPD (with chronic respiratory failure using 2 L nasal cannula at baseline), type 2 diabetes mellitus with nephropathy, mixed hyperlipidemia, hypertension, chronic kidney disease is stage III, atrial fibrillation (chronically on Coumadin) and history of pulmonary embolism; who presented to the hospital secondary to episode of left costal chest discomfort.  The pain has been present for over 15 hours now, is non-radiated, dull in nature, 5-6 out of 10 in intensity and according to patient is worse with movement and when coughing.  She has some associated shortness of breath and reports an episode of vomiting the night prior to admission.  According to family patient has been for the last week or so experiencing URI symptoms and so her PCP on 9/18; she was diagnosed with a COPD  with acute bronchitis and was started on prednisone and Zithromax.  Symptoms failed to demonstrate any improvement.  Patient also expressed having some anorexia. Patient denies fever, chills, abdominal pain, nausea, dysuria, hematuria, melena, hematochezia, hematemesis, headaches, focal weakness or any other complaints.  In the ED chest x-ray demonstrated no acute cardiopulmonary process; but shoed bronchitic changes.  EKG without acute ischemic abnormalities and troponin x1-.  Patient received 1 dose of fentanyl and 1 dose of Toradol with some improvement in her discomfort.  Given risk factors and a heart score of 4 TRH has been called to place patient in observation for further evaluation and management.  Hospital Course:  1-Chest pain: Atypical; the patient with a heart score of 4. -Risk factors include: Hypertension, hyperlipidemia, type 2 diabetes, age. -No further chest discomfort at the end of discharge. -EKG without acute ischemic changes, troponin negative x3, no acute abnormalities on telemetry. -Patient will continue the use of beta-blocker, statins and aspirin. -her discomfort most likely associated with MSK pain in the setting of coughing spell from COPD with acute bronchitis Vs GERD..  -Complete treatment as mentioned below for COPD with acute bronchitis -Discharge on PPI. -CXR w/o acute infiltrates.  2-COPD with acute bronchitis ([name]) -continue steroids, albuterol, Symbicort, Spiriva and doxycycline for underlying bronchiectasis. -Continue oxygen supplementation and Mucinex. -Outpatient follow-up with PCP. -Better and improved at time of discharge, with capacity of continue treatment for COPD with acute bronchitis as an outpatient.  3-GERD: -No further episode of nausea vomiting. -Patient has been discharged on PPI.  4-Hypertension -overall stable; but slightly soft.. -Will continue metoprolol, resume HCTZ and Cozaar one half dose where she  normally takes. -Reassess  blood pressure during follow-up visit and adjust antihypertensive regimen as needed. -Patient advised to follow heart healthy diet.  5-Hyperlipidemia associated with type 2 diabetes mellitus ([name]) -Lipid panel with LDL of 50, triglycerides 83 and HDL of 48. -Continue statins.  6-acute kidney injury on CKD stage 3 secondary to diabetes ([name]) -nephrotoxic agents held for 24 hours and IV fluids provided -Patient able to eat and drink at the moment of discharge without any difficulties. -Advised to maintain good hydration. -Okay to resume home medications. -After reviewing long-term records in our system patient's creatinine has been fluctuating from 0.9-1.2 intermittently.  7-Paroxysmal Atrial fibrillation, chronic ([name]) -rate controlled -will continue metoprolol -CHADsVASC score 4 -patient chronically on Coumadin; will continue anticoagulation -Outpatient follow-up with her electrophysiologist as previously scheduled.  8-Type 2 diabetes mellitus with stage 3 chronic kidney disease, without long-term current use of insulin ([name]) -recent hemoglobin A1c 6.5 -Resume home oral hypoglycemic agents -Patient advised to follow modified carbohydrate diet.   9-chronic resp failure with hypoxia -continue oxygen supplementation  -good O2 sat currently; patient uses 2L [place] chronically.  -Stable.  10-pulmonary embolism -continue coumadin per pharmacy  -Good oxygen saturation on chronic oxygen supplementation (2 L through nasal cannula close (. -Patient denies chest pain.   Procedures:  See below for x-ray reports.  Consultations:  None  Discharge Exam: Vitals:   04/08/18 1340 04/08/18 1454  BP:  99/69  Pulse:  87  Resp:  16  Temp:  98.6 F (37 C)  SpO2: 93% 96%    General: Afebrile, denies chest pain, reporting improvement in her breathing; still with some productive cough.  No nausea, no vomiting, eating and drinking without difficulties. Cardiovascular: Rate controlled,  no rubs, no gallops; soft systolic ejection murmur, no JVD. Respiratory: No using accessory muscles, positive rhonchi, currently without any wheezing, improved air movement bilaterally.  Patient waiting 2 L nasal cannula supplementation (chronically). Abdomen: Soft, nontender, nondistended, positive bowel sounds Extremities: No edema, no cyanosis, no clubbing Neurologic exam: Alert, awake and oriented x3, no focal neurologic deficits.  Discharge Instructions   Discharge Instructions    (HEART FAILURE PATIENTS) Call MD:  Anytime you have any of the following symptoms: 1) 3 pound weight gain in 24 hours or 5 pounds in 1 week 2) shortness of breath, with or without a dry hacking cough 3) swelling in the hands, feet or stomach 4) if you have to sleep on extra pillows at night in order to breathe.   Complete by:  As directed    Diet - low sodium heart healthy   Complete by:  As directed    Discharge instructions   Complete by:  As directed    Keep yourself well-hydrated Take medications as prescribed Follow heart healthy/low-sodium diet. Arrange follow-up with PCP in 2 weeks.   Increase activity slowly   Complete by:  As directed      Allergies as of 04/08/2018      Reactions   Penicillins Nausea And Vomiting, Rash, Other (See Comments)   Childhood reaction. Has patient had a PCN reaction causing immediate rash, facial/tongue/throat swelling, SOB or lightheadedness with hypotension: Yes Has patient had a PCN reaction causing severe rash involving mucus membranes or skin necrosis: Yes Has patient had a PCN reaction that required hospitalization: Yes Has patient had a PCN reaction occurring within the last 10 years: No If all of the above answers are "NO", then may proceed with Cephalosporin use.      Medication List  TAKE these medications   acetaminophen 500 MG tablet Commonly known as:  TYLENOL Take 1,000 mg by mouth 2 (two) times daily as needed for mild pain.    acetaminophen-codeine 300-30 MG tablet Commonly known as:  TYLENOL #3 TAKE 1 TABLET BY MOUTH EVERY 8 HOURS AS NEEDED FOR SEVERE PAIN What changed:  See the new instructions.   adapalene 0.1 % gel Commonly known as:  DIFFERIN Apply to scalp psoriasis 1 time daily What changed:    how to take this  when to take this  additional instructions   albuterol 108 (90 Base) MCG/ACT inhaler Commonly known as:  PROVENTIL HFA;VENTOLIN HFA Inhale 2 puffs into the lungs every 6 (six) hours as needed for wheezing or shortness of breath.   aspirin 81 MG tablet Take 81 mg by mouth daily.   budesonide-formoterol 80-4.5 MCG/ACT inhaler Commonly known as:  SYMBICORT Inhale 2 puffs into the lungs 2 (two) times daily.   dextromethorphan-guaiFENesin 30-600 MG 12hr tablet Commonly known as:  MUCINEX DM Take 1 tablet by mouth 2 (two) times daily.   doxycycline 100 MG tablet Commonly known as:  VIBRA-TABS Take 1 tablet (100 mg total) by mouth every 12 (twelve) hours.   gabapentin 100 MG capsule Commonly known as:  NEURONTIN Start with 1 tab po qhs X 1 week, then increase to 1 tab po bid X 1 week then 1 tab po tid prn What changed:    how much to take  how to take this  when to take this  reasons to take this  additional instructions   Garlic 8144 MG Caps Take 1,000 mg by mouth daily.   glipiZIDE 5 MG tablet Commonly known as:  GLUCOTROL TAKE 1 TABLET BY MOUTH TWICE DAILY AS DIRECTED. What changed:    when to take this  additional instructions   losartan 25 MG tablet Commonly known as:  COZAAR Take 0.5 tablets (12.5 mg total) by mouth daily. What changed:  how much to take   lovastatin 20 MG tablet Commonly known as:  MEVACOR Take 1 tablet (20 mg total) by mouth at bedtime.   metFORMIN 500 MG tablet Commonly known as:  GLUCOPHAGE TAKE 1 TABLET BY MOUTH TWICE DAILY AS DIRECTED What changed:    when to take this  additional instructions   metoprolol tartrate 100 MG  tablet Commonly known as:  LOPRESSOR Take 1 tablet (100 mg total) by mouth 2 (two) times daily.   multivitamin tablet Take 1 tablet by mouth daily.   omega-3 acid ethyl esters 1 g capsule Commonly known as:  LOVAZA Take 2 g by mouth 2 (two) times daily.   ondansetron 4 MG disintegrating tablet Commonly known as:  ZOFRAN-ODT Take 1 tablet (4 mg total) by mouth every 8 (eight) hours as needed for nausea or vomiting.   pantoprazole 40 MG tablet Commonly known as:  PROTONIX Take 1 tablet (40 mg total) by mouth daily. Start taking on:  04/09/2018   predniSONE 10 MG tablet Commonly known as:  DELTASONE Take 3 tablets (30 mg total) by mouth daily with breakfast for 5 days. Start taking on:  04/09/2018 What changed:    medication strength  how much to take   Tazarotene 0.1 % Foam Apply to scalp psoriasis 1 time daily. What changed:    how to take this  when to take this  additional instructions   Tiotropium Bromide Monohydrate 2.5 MCG/ACT Aers Inhale 2 puffs into the lungs daily.   triamcinolone ointment 0.5 %  Commonly known as:  KENALOG Apply 1 application topically 2 (two) times daily.   triamterene-hydrochlorothiazide 37.5-25 MG capsule Commonly known as:  DYAZIDE Take 1 each (1 capsule total) by mouth daily.   Vitamin D3 1000 units Caps Take 1 capsule by mouth daily.   warfarin 5 MG tablet Commonly known as:  COUMADIN Take as directed. If you are unsure how to take this medication, talk to your nurse or doctor. Original instructions:  Take 5-7.5 mg by mouth See admin instructions. Take 1 and 1/2 tablets on Monday then take 1 tablet the other days      Allergies  Allergen Reactions  . Penicillins Nausea And Vomiting, Rash and Other (See Comments)    Childhood reaction. Has patient had a PCN reaction causing immediate rash, facial/tongue/throat swelling, SOB or lightheadedness with hypotension: Yes Has patient had a PCN reaction causing severe rash involving  mucus membranes or skin necrosis: Yes Has patient had a PCN reaction that required hospitalization: Yes Has patient had a PCN reaction occurring within the last 10 years: No If all of the above answers are "NO", then may proceed with Cephalosporin use.    Follow-up Information    Alejandra Barrack, MD. Schedule an appointment as soon as possible for a visit in 2 week(s).   Specialty:  Family Medicine Contact information: [place] [address] [phone]        Constance Haw, MD .   Specialty:  Cardiology Contact information: [place] [address] [phone]           The results of significant diagnostics from this hospitalization (including imaging, microbiology, ancillary and laboratory) are listed below for reference.    Significant Diagnostic Studies: Dg Chest 2 View  Result Date: 04/06/2018 CLINICAL DATA:  Intermittent LEFT chest pain, shortness of breath, cough and vomiting today. Diagnosis with pneumonia yesterday. EXAM: CHEST - 2 VIEW COMPARISON:  Chest radiograph April 05, 2018. FINDINGS: Cardiomediastinal silhouette is normal. Calcified aortic arch. Dual lead LEFT cardiac pacemaker in situ. Minimal LEFT lung base scarring. No pleural effusions or focal consolidations. Trachea projects midline and there is no pneumothorax. Soft tissue planes and included osseous structures are non-suspicious. Osteopenia. IMPRESSION: No acute cardiopulmonary process. Aortic Atherosclerosis (ICD10-I70.0). Electronically Signed   By: Elon Alas M.D.   On: 04/06/2018 22:18   Dg Chest 2 View  Result Date: 04/05/2018 CLINICAL DATA:  Productive cough. EXAM: CHEST - 2 VIEW COMPARISON:  02/05/2017 FINDINGS: Heart size and vascularity are normal. Pacemaker in place. Slight chronic accentuation of the interstitial markings with minimal scarring at the left lung base laterally. No effusions. No acute bone abnormality. Aortic atherosclerosis.  IMPRESSION: No active cardiopulmonary disease. Aortic Atherosclerosis (ICD10-I70.0). Electronically Signed   By: Lorriane Shire M.D.   On: 04/05/2018 15:40    Microbiology: Recent Results (from the past 240 hour(s))  MRSA PCR Screening     Status: None   Collection Time: 04/07/18 11:55 AM  Result Value Ref Range Status   MRSA by PCR NEGATIVE NEGATIVE Final    Comment:        The GeneXpert MRSA Assay (FDA approved for NASAL specimens only), is one component of a comprehensive MRSA colonization surveillance program. It is not intended to diagnose MRSA infection nor to guide or monitor treatment for MRSA infections. Performed at [facility], [place] [address], [place], [place] [address]      Labs: Basic Metabolic Panel: Recent Labs  Lab  04/05/18 1100 04/06/18 2126  NA 138 139  K 4.8 5.0  CL 99 98  CO2 33* 27  GLUCOSE 175* 187*  BUN 24* 30*  CREATININE 0.96 1.22*  CALCIUM 10.0 10.2   Liver Function Tests: Recent Labs  Lab 04/05/18 1100  AST 15  ALT 11  ALKPHOS 40  BILITOT 0.3  PROT 8.2  ALBUMIN 4.2   CBC: Recent Labs  Lab 04/05/18 1100 04/06/18 2126  WBC 5.8 6.8  HGB 12.5 12.8  HCT 37.2 37.8  MCV 81.4 79.6  PLT 230.0 270   Cardiac Enzymes: Recent Labs  Lab 04/07/18 0910 04/07/18 1346 04/07/18 2008  TROPONINI <0.03 <0.03 <0.03    CBG: Recent Labs  Lab 04/07/18 1127 04/07/18 1614 04/07/18 2210 04/08/18 0738 04/08/18 1058  GLUCAP 166* 179* 153* 86 217*    Signed:  Barton Dubois MD.  [facility] 04/08/2018, 3:09 PM

## 2018-04-08 NOTE — Progress Notes (Signed)
Pt  Has order to discharge home. Reports "have no way go home"..  that "daughter not at home" .Marland KitchenMarland KitchenThat "Going to take a Cab Home"  On chronic oxygen and needs home oxygen thank for transport.This Nurse attempted to reach family via phone but unsuccessful. Called each number on file twice and unable to leave voicemail. Daughter Mateo Flow @ [phone]; Son Edison Nasuti @ [phone]; and Reynolds Bowl @ [phone]. Attending and discharge MD, Dr  Carmelina Noun paged and made aware. No active CM on duty to assist at this time. Will continue to reach out to the family for ride home. Leveda Anna, BSN, RN

## 2018-04-10 ENCOUNTER — Telehealth: Payer: Self-pay | Admitting: *Deleted

## 2018-04-10 NOTE — Telephone Encounter (Signed)
Per chart review: Admit date: 04/06/2018 Discharge date: 04/08/2018  Time spent: 35 minutes  Recommendations for Outpatient Follow-up:  1. Repeat basic metabolic panel to follow electrolytes and renal function 2. Reassess blood pressure and further adjust antihypertensive regimen as needed.   Discharge Diagnoses:  Principal Problem:   Chest pain Active Problems:   COPD with acute bronchitis ([place])   Hypertension associated with diabetes ([place])   Hyperlipidemia associated with type 2 diabetes mellitus ([place])   CKD stage 3 secondary to diabetes ([place])   Atrial fibrillation, chronic ([place])   Type 2 diabetes mellitus with stage 3 chronic kidney disease, without long-term current use of insulin ([place])   Discharge Condition: Stable and improved.  Patient has been discharged home with instruction to follow-up with PCP in 2 weeks.  Diet recommendation: Heart healthy modified carbohydrates. _________________________________________________________________________ Per telephone call: Transition Care Management Follow-up Telephone Call   Date discharged? 04/08/18   How have you been since you were released from the hospital? "doing pretty good"   Do you understand why you were in the hospital? yes   Do you understand the discharge instructions? yes   Where were you discharged to? Home   Items Reviewed:  Medications reviewed: yes  Allergies reviewed: yes  Dietary changes reviewed: yes  Referrals reviewed: yes   Functional Questionnaire:   Activities of Daily Living (ADLs):   She states they are independent in the following: ambulation, bathing and hygiene, feeding, continence, grooming, toileting and dressing States they require assistance with the following: States she is getting around pretty good but daughter does help out   Any transportation issues/concerns?: yes, Daughter drives her to appts, she will have her call back to schedule a hospital follow up within 2  weeks.   Any patient concerns? yes   Confirmed importance and date/time of follow-up visits scheduled yes. Daughter will call back to schedule visit.  Confirmed with patient if condition begins to worsen call PCP or go to the ER.  Patient was given the office number and encouraged to call back with question or concerns.  : yes

## 2018-04-25 ENCOUNTER — Encounter: Payer: Self-pay | Admitting: Family Medicine

## 2018-04-25 ENCOUNTER — Ambulatory Visit (INDEPENDENT_AMBULATORY_CARE_PROVIDER_SITE_OTHER): Payer: Medicare Other | Admitting: Family Medicine

## 2018-04-25 VITALS — BP 118/72 | HR 111 | Temp 97.7°F | Ht 65.0 in | Wt 144.0 lb

## 2018-04-25 DIAGNOSIS — R7989 Other specified abnormal findings of blood chemistry: Secondary | ICD-10-CM

## 2018-04-25 DIAGNOSIS — E1122 Type 2 diabetes mellitus with diabetic chronic kidney disease: Secondary | ICD-10-CM | POA: Diagnosis not present

## 2018-04-25 DIAGNOSIS — E1159 Type 2 diabetes mellitus with other circulatory complications: Secondary | ICD-10-CM | POA: Diagnosis not present

## 2018-04-25 DIAGNOSIS — J440 Chronic obstructive pulmonary disease with acute lower respiratory infection: Secondary | ICD-10-CM

## 2018-04-25 DIAGNOSIS — Z23 Encounter for immunization: Secondary | ICD-10-CM

## 2018-04-25 DIAGNOSIS — I152 Hypertension secondary to endocrine disorders: Secondary | ICD-10-CM

## 2018-04-25 DIAGNOSIS — I10 Essential (primary) hypertension: Secondary | ICD-10-CM

## 2018-04-25 DIAGNOSIS — N183 Chronic kidney disease, stage 3 unspecified: Secondary | ICD-10-CM

## 2018-04-25 DIAGNOSIS — J209 Acute bronchitis, unspecified: Secondary | ICD-10-CM

## 2018-04-25 LAB — BASIC METABOLIC PANEL
BUN: 20 mg/dL (ref 6–23)
CHLORIDE: 105 meq/L (ref 96–112)
CO2: 30 mEq/L (ref 19–32)
Calcium: 9.3 mg/dL (ref 8.4–10.5)
Creatinine, Ser: 1.12 mg/dL (ref 0.40–1.20)
GFR: 59.57 mL/min — AB (ref >60.00)
GLUCOSE: 54 mg/dL — AB (ref 70–99)
POTASSIUM: 4.6 meq/L (ref 3.5–5.1)
SODIUM: 141 meq/L (ref 135–145)

## 2018-04-25 NOTE — Progress Notes (Signed)
Subjective:  Alejandra [name] is a 84 y.o. female who presents today for a TCM visit.  HPI:  Summary of Hospital admission: Reason for admission: Chest pain Date of admission: 04/06/2018 Date of discharge: 04/08/2018 Date of Interactive contact: 04/10/2018 Summary of Hospital course: Patient presented to the emergency department on 04/06/2018 with worsening chest pain and shortness of breath for several hours.  Work-up in the ED showed a chest x-ray with bronchitic changes.  She was admitted for ACS rule out and started on doxycycline and prednisone for COPD exacerbation.  Patient had her troponins cycled which were negative.  Chest pain gradually improved as she was treated for her COPD exacerbation.  Symptoms gradually improved and she was discharged home in stable condition.  Interim History:   Chest pain Symptoms have resolved.  No further episodes of chest pain or shortness of breath.  COPD/shortness of breath.   She is doing much better regarding her breathing.  She is currently on Symbicort and albuterol as needed.  No fevers or chills.  Hypertension Patient's dose of losartan was decreased by half while hospitalized.  She is currently on losartan 12.5 mg daily and metoprolol tartrate 100 mg twice daily.  She is also on Dyazide 37.5-25 1 capsule daily.  ROS: Per HPI, otherwise a complete review of systems was negative.   PMH:  The following were reviewed and entered/updated in epic: Past Medical History:  Diagnosis Date  . AAA (abdominal aortic aneurysm) ([place])   . Arthritis   . COPD (chronic obstructive pulmonary disease) ([place])   . DM (diabetes mellitus) ([place])   . Dyslipidemia   . Hyperlipidemia   . Hypertension   . Left foot infection 2013  . 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 2013  . Pulmonary embolism [facility])    Patient Active Problem List   Diagnosis Date Noted  . Psoriasis 01/04/2018  . Osteoarthritis 11/03/2017  . Type 2 diabetes mellitus with stage 3 chronic kidney disease,  without long-term current use of insulin ([place]) 09/28/2017  . Syncope 02/02/2017  . Atrial fibrillation, chronic   . Anticoagulated on Coumadin 10/09/2016  . Neck pain 12/11/2015  . CKD stage 3 secondary to diabetes ([place]) 07/08/2015  . Swelling of limb-Right  Leg 11/30/2013  . Aneurysm of abdominal vessel ([place]) 11/29/2011  . Hypertension associated with diabetes ([place]) 10/29/2011  . Hyperlipidemia associated with type 2 diabetes mellitus ([place]) 10/29/2011  . UNSPECIFIED VITAMIN D DEFICIENCY 09/21/2010  . COPD with acute bronchitis ([place]) 02/13/2010  . PULMONARY NODULE 02/13/2010   Past Surgical History:  Procedure Laterality Date  . ABDOMINAL AORTIC ANEURYSM REPAIR  10-30-2011  . CHOLECYSTECTOMY N/A 10/14/2016   Procedure: LAPAROSCOPIC CHOLECYSTECTOMY;  Surgeon: Clovis Riley, MD;  Location: [place];  Service: General;  Laterality: N/A;  . ENDOVASCULAR STENT INSERTION  10/30/2011   Procedure: ENDOVASCULAR STENT GRAFT INSERTION;  Surgeon: Serafina Mitchell, MD;  Location: [place];  Service: Vascular;  Laterality: N/A;  . PACEMAKER IMPLANT N/A 02/04/2017   Procedure: Pacemaker Implant;  Surgeon: Constance Haw, MD;  Location: [place] [facility];  Service: Cardiovascular;  Laterality: N/A;  . VESICOVAGINAL FISTULA CLOSURE W/ TAH      Family History  Problem Relation Age of Onset  . Heart disease Mother   . Hypertension Mother   . Heart attack Mother   . Hypertension Father   . Diabetes Father   . Heart attack Father   . Heart disease Sister        x  3  . Hypertension Sister   . Heart attack Sister   . Clotting disorder Daughter   . Clotting disorder Son        multiple sons  . Diabetes Son   . Hypertension Brother     Medications- Reconciled discharge and current medications in Epic.  Current Outpatient Medications  Medication Sig Dispense Refill  . acetaminophen (TYLENOL) 500 MG tablet Take 1,000 mg by mouth 2 (two) times daily as needed for mild pain.    Marland Kitchen  acetaminophen-codeine (TYLENOL #3) 300-30 MG tablet TAKE 1 TABLET BY MOUTH EVERY 8 HOURS AS NEEDED FOR SEVERE PAIN (Patient taking differently: Take 1 tablet by mouth every 8 (eight) hours as needed for moderate pain. ) 30 tablet 0  . adapalene (DIFFERIN) 0.1 % gel Apply to scalp psoriasis 1 time daily (Patient taking differently: Apply topically at bedtime. Apply to scalp psoriasis) 45 g 0  . albuterol (PROVENTIL HFA;VENTOLIN HFA) 108 (90 Base) MCG/ACT inhaler Inhale 2 puffs into the lungs every 6 (six) hours as needed for wheezing or shortness of breath. 1 Inhaler 0  . aspirin 81 MG tablet Take 81 mg by mouth daily.      . budesonide-formoterol (SYMBICORT) 80-4.5 MCG/ACT inhaler Inhale 2 puffs into the lungs 2 (two) times daily. 1 Inhaler 3  . Cholecalciferol (VITAMIN D3) 1000 UNITS CAPS Take 1 capsule by mouth daily.      Marland Kitchen dextromethorphan-guaiFENesin (MUCINEX DM) 30-600 MG 12hr tablet Take 1 tablet by mouth 2 (two) times daily. 30 tablet 0  . gabapentin (NEURONTIN) 100 MG capsule Start with 1 tab po qhs X 1 week, then increase to 1 tab po bid X 1 week then 1 tab po tid prn (Patient taking differently: Take 100 mg by mouth 3 (three) times daily as needed (for pain). ) 90 capsule 1  . Garlic 3151 MG CAPS Take 1,000 mg by mouth daily.     Marland Kitchen glipiZIDE (GLUCOTROL) 5 MG tablet TAKE 1 TABLET BY MOUTH TWICE DAILY AS DIRECTED. (Patient taking differently: Take 5 mg by mouth 2 (two) times daily before a meal. ) 60 tablet 0  . losartan (COZAAR) 25 MG tablet Take 0.5 tablets (12.5 mg total) by mouth daily.    Marland Kitchen lovastatin (MEVACOR) 20 MG tablet Take 1 tablet (20 mg total) by mouth at bedtime. 90 tablet 3  . metFORMIN (GLUCOPHAGE) 500 MG tablet TAKE 1 TABLET BY MOUTH TWICE DAILY AS DIRECTED (Patient taking differently: Take 500 mg by mouth 2 (two) times daily with a meal. ) 60 tablet 0  . metoprolol tartrate (LOPRESSOR) 100 MG tablet Take 1 tablet (100 mg total) by mouth 2 (two) times daily. 180 tablet 3  .  Multiple Vitamin (MULTIVITAMIN) tablet Take 1 tablet by mouth daily.      Marland Kitchen omega-3 acid ethyl esters (LOVAZA) 1 G capsule Take 2 g by mouth 2 (two) times daily.    . ondansetron (ZOFRAN ODT) 4 MG disintegrating tablet Take 1 tablet (4 mg total) by mouth every 8 (eight) hours as needed for nausea or vomiting. 20 tablet 0  . pantoprazole (PROTONIX) 40 MG tablet Take 1 tablet (40 mg total) by mouth daily. 30 tablet 1  . Tazarotene 0.1 % FOAM Apply to scalp psoriasis 1 time daily. (Patient taking differently: Apply topically daily. Apply to scalp psoriasis) 50 g 1  . Tiotropium Bromide Monohydrate (SPIRIVA RESPIMAT) 2.5 MCG/ACT AERS Inhale 2 puffs into the lungs daily. 4 g 11  . triamcinolone ointment (KENALOG) 0.5 %  Apply 1 application topically 2 (two) times daily. 60 g 0  . triamterene-hydrochlorothiazide (DYAZIDE) 37.5-25 MG capsule Take 1 each (1 capsule total) by mouth daily. 90 capsule 3  . warfarin (COUMADIN) 5 MG tablet Take 5-7.5 mg by mouth See admin instructions. Take 1 and 1/2 tablets on Monday then take 1 tablet the other days     No current facility-administered medications for this visit.     Allergies-reviewed and updated Allergies  Allergen Reactions  . Penicillins Nausea And Vomiting, Rash and Other (See Comments)    Childhood reaction. Has patient had a PCN reaction causing immediate rash, facial/tongue/throat swelling, SOB or lightheadedness with hypotension: Yes Has patient had a PCN reaction causing severe rash involving mucus membranes or skin necrosis: Yes Has patient had a PCN reaction that required hospitalization: Yes Has patient had a PCN reaction occurring within the last 10 years: No If all of the above answers are "NO", then may proceed with Cephalosporin use.     Social History   Socioeconomic History  . Marital status: Widowed    Spouse name: Not on file  . Number of children: 2  . Years of education: 60  . Highest education level: Not on file    Occupational History  . Occupation: retired    Comment: Building surveyor  Social Needs  . Financial resource strain: Not on file  . Food insecurity:    Worry: Not on file    Inability: Not on file  . Transportation needs:    Medical: Not on file    Non-medical: Not on file  Tobacco Use  . Smoking status: Former Smoker    Packs/day: 0.50    Years: 54.00    Pack years: 27.00    Types: Cigarettes    Last attempt to quit: 01/23/2010    Years since quitting: 8.2  . Smokeless tobacco: Never Used  Substance and Sexual Activity  . Alcohol use: No    Alcohol/week: 0.0 standard drinks  . Drug use: No  . Sexual activity: Not on file  Lifestyle  . Physical activity:    Days per week: Not on file    Minutes per session: Not on file  . Stress: Not on file  Relationships  . Social connections:    Talks on phone: Not on file    Gets together: Not on file    Attends religious service: Not on file    Active member of club or organization: Not on file    Attends meetings of clubs or organizations: Not on file    Relationship status: Not on file  Other Topics Concern  . Not on file  Social History Narrative   Fun: Goes to church, Watch TV   Denies religious beliefs affecting health care.     Objective:  Physical Exam: BP 118/72 (BP Location: Left Arm, Patient Position: Sitting, Cuff Size: Normal)   Pulse (!) 111   Temp 97.7 F (36.5 C) (Oral)   Ht 5\' 5"  (1.651 m)   Wt 144 lb (65.3 kg)   SpO2 94%   BMI 23.96 kg/m   Gen: NAD, resting comfortably CV: RRR with no murmurs appreciated Pulm: NWOB, CTAB with no crackles, wheezes, or rhonchi GI: Normal bowel sounds present. Soft, Nontender, Nondistended. MSK: No edema, cyanosis, or clubbing noted Skin: Warm, dry Neuro: Grossly normal, moves all extremities Psych: Normal affect and thought content  Assessment/Plan:  Hypertension associated with diabetes ([place]) At goal.  Continue losartan 12.5 mg daily, Toprol tartrate  100 mg twice  daily, and Dyazide 37.5-25 once daily.  COPD with acute bronchitis ([place]) Doing much better.  Continue Symbicort and albuterol as needed.  Gave DME order for portable oxygen tank.   CKD stage 3 secondary to diabetes [facility]) Patient had slight creatinine bump on last day of hospitalization.  We will recheck BMET today.  Chest pain Likely musculoskeletal in setting of upper respiratory infection.  Symptoms have resolved.  No further management needed.  Preventive health care Flu shot given today.  Patient has a moderate level of medical complexity due to number of diagnoses/treatment options and amount/complexity of data reviewed.   Algis Greenhouse. [name], MD 04/25/2018 12:07 PM

## 2018-04-25 NOTE — Patient Instructions (Signed)
It was very nice to see you today!  We will give you your flu shot today and check blood work.  Come back to see me in 6 months, or sooner as needed.   Take care, Dr [name]

## 2018-04-25 NOTE — Assessment & Plan Note (Signed)
Patient had slight creatinine bump on last day of hospitalization.  We will recheck BMET today.

## 2018-04-25 NOTE — Assessment & Plan Note (Signed)
Doing much better.  Continue Symbicort and albuterol as needed.  Gave DME order for portable oxygen tank.

## 2018-04-25 NOTE — Assessment & Plan Note (Signed)
At goal.  Continue losartan 12.5 mg daily, Toprol tartrate 100 mg twice daily, and Dyazide 37.5-25 once daily.

## 2018-04-26 NOTE — Progress Notes (Signed)
Please inform patient of the following:  Her kidney function is stable, but her blood sugar was low. If she is not having any symptoms of hypoglycemia (shakiness, sweating, palpitations, hunger, anxiety, etc) then we can just keep an eye on it. If she is having symptoms, would like for her to decrease her dose of glipizide to just once daily.  Algis Greenhouse. [name], MD 04/26/2018 8:13 AM

## 2018-04-27 ENCOUNTER — Other Ambulatory Visit: Payer: Self-pay | Admitting: Family Medicine

## 2018-04-28 ENCOUNTER — Other Ambulatory Visit: Payer: Self-pay | Admitting: Family Medicine

## 2018-05-01 ENCOUNTER — Other Ambulatory Visit: Payer: Self-pay

## 2018-05-01 ENCOUNTER — Telehealth: Payer: Self-pay | Admitting: Family Medicine

## 2018-05-01 MED ORDER — WARFARIN SODIUM 5 MG PO TABS: 5.0000 mg | ORAL_TABLET | ORAL | 5 refills | Status: DC

## 2018-05-01 NOTE — Telephone Encounter (Signed)
Rx sent to pharmacy   

## 2018-05-01 NOTE — Telephone Encounter (Signed)
Copied from [place] [id number]. Topic: Quick Communication - Rx Refill/Question >> May 01, 2018 10:05 AM Marin Olp L wrote: Medication: warfarin (COUMADIN) 5 MG tablet (urgent, none left)  Has the patient contacted their pharmacy? Yes.   (Agent: If no, request that the patient contact the pharmacy for the refill.) (Agent: If yes, when and what did the pharmacy advise?) Will contact office.  Preferred Pharmacy (with phone number or street name): [id number] - [place], [address] [place] [address] Phone: [phone] Fax: [phone]  Agent: Please be advised that RX refills may take up to 3 business days. We ask that you follow-up with your pharmacy.

## 2018-05-08 ENCOUNTER — Ambulatory Visit (INDEPENDENT_AMBULATORY_CARE_PROVIDER_SITE_OTHER): Payer: Medicare Other | Admitting: *Deleted

## 2018-05-08 DIAGNOSIS — I480 Paroxysmal atrial fibrillation: Secondary | ICD-10-CM

## 2018-05-08 NOTE — Progress Notes (Signed)
Remote pacemaker transmission.   

## 2018-05-10 ENCOUNTER — Other Ambulatory Visit: Payer: Self-pay

## 2018-05-10 ENCOUNTER — Telehealth: Payer: Self-pay | Admitting: Family Medicine

## 2018-05-10 DIAGNOSIS — J440 Chronic obstructive pulmonary disease with acute lower respiratory infection: Principal | ICD-10-CM

## 2018-05-10 DIAGNOSIS — J209 Acute bronchitis, unspecified: Secondary | ICD-10-CM

## 2018-05-10 NOTE — Telephone Encounter (Signed)
Order has been faxed to [place].

## 2018-05-10 NOTE — Telephone Encounter (Signed)
Copied from [place] [id number]. Topic: General - Other >> May 09, 2018  4:35 PM Yvette Rack wrote: Reason for CRM: Pt daughter Mateo Flow stated her mother needs a Rx for a portable oxygen concentrator and it should be faxed to [facility] referral dept. Fax# [phone]

## 2018-05-12 ENCOUNTER — Telehealth: Payer: Self-pay | Admitting: Family Medicine

## 2018-05-25 ENCOUNTER — Other Ambulatory Visit: Payer: Self-pay | Admitting: Family Medicine

## 2018-05-25 ENCOUNTER — Other Ambulatory Visit: Payer: Self-pay

## 2018-05-25 MED ORDER — ADAPALENE 0.1 % EX GEL: CUTANEOUS | 0 refills | Status: AC

## 2018-05-25 NOTE — Telephone Encounter (Signed)
Patient daughter is calling and states the pharmacy did not get this and would like to know if it could be resent.

## 2018-05-25 NOTE — Telephone Encounter (Signed)
See note

## 2018-05-25 NOTE — Telephone Encounter (Signed)
Rx sent to pharmacy   

## 2018-06-13 ENCOUNTER — Ambulatory Visit: Payer: Self-pay

## 2018-06-13 NOTE — Telephone Encounter (Signed)
See note

## 2018-06-13 NOTE — Telephone Encounter (Signed)
Out going call to daughter or Patient who states that the Pharmacist changed the Colour of her cozaar 25mg  pill so patient has been taking two of the medication.  Tried to get the Blood Pressure assessed by daughter.  Patient became agitated.  Would not let daughter take her blood pressure. Triager could hear Paitent getting aggravated, speaking firm , denying, asses  Of her daughter to her B/P. Referreda daughter to Her pharmacist, observe Patient for signs and Sx. Daughter voiced understanding of Care Advice.                       Reason for Disposition . [1] DOUBLE DOSE (an extra dose or lesser amount) of prescription drug AND [2] NO symptoms (Exception: a double dose of antibiotics)  Answer Assessment - Initial Assessment Questions 1. SYMPTOMS: "Do you have any symptoms?"    Denies 2. SEVERITY: If symptoms are present, ask "Are they mild, moderate or severe?"     na  Protocols used: MEDICATION QUESTION CALL-A-AH

## 2018-06-14 NOTE — Telephone Encounter (Signed)
Noted  

## 2018-06-20 ENCOUNTER — Ambulatory Visit (INDEPENDENT_AMBULATORY_CARE_PROVIDER_SITE_OTHER): Payer: Medicare Other | Admitting: General Practice

## 2018-06-20 DIAGNOSIS — Z7901 Long term (current) use of anticoagulants: Secondary | ICD-10-CM | POA: Diagnosis not present

## 2018-06-20 LAB — POCT INR: INR: 2.9 (ref 2.0–3.0)

## 2018-06-20 NOTE — Patient Instructions (Addendum)
Pre visit review using our clinic review tool, if applicable. No additional management support is needed unless otherwise documented below in the visit note.  Continue to take 1 tablet daily except 1 1/2 tablets on Monday.  Re-check in 6 weeks.

## 2018-07-09 LAB — CUP PACEART REMOTE DEVICE CHECK
Battery Remaining Longevity: 122 mo
Battery Remaining Percentage: 95.5 %
Battery Voltage: 3.01 V
Brady Statistic AP VS Percent: 2.3 %
Brady Statistic AS VP Percent: 1 %
Brady Statistic AS VS Percent: 98 %
Brady Statistic RA Percent Paced: 1.9 %
Date Time Interrogation Session: 20191021070809
Implantable Lead Implant Date: 20180720
Implantable Lead Location: 753859
Implantable Pulse Generator Implant Date: 20180720
Lead Channel Impedance Value: 390 Ohm
Lead Channel Pacing Threshold Pulse Width: 0.5 ms
Lead Channel Pacing Threshold Pulse Width: 0.5 ms
Lead Channel Sensing Intrinsic Amplitude: 4.7 mV
Lead Channel Sensing Intrinsic Amplitude: 6.9 mV
Lead Channel Setting Pacing Amplitude: 2 V
MDC IDC LEAD IMPLANT DT: 20180720
MDC IDC LEAD LOCATION: 753860
MDC IDC MSMT LEADCHNL RA PACING THRESHOLD AMPLITUDE: 0.75 V
MDC IDC MSMT LEADCHNL RV IMPEDANCE VALUE: 450 Ohm
MDC IDC MSMT LEADCHNL RV PACING THRESHOLD AMPLITUDE: 0.75 V
MDC IDC SET LEADCHNL RV PACING AMPLITUDE: 2.5 V
MDC IDC SET LEADCHNL RV PACING PULSEWIDTH: 0.5 ms
MDC IDC SET LEADCHNL RV SENSING SENSITIVITY: 2 mV
MDC IDC STAT BRADY AP VP PERCENT: 1 %
MDC IDC STAT BRADY RV PERCENT PACED: 1 %
Pulse Gen Model: 2272
Pulse Gen Serial Number: 8917538

## 2018-07-20 ENCOUNTER — Ambulatory Visit: Payer: Self-pay | Admitting: *Deleted

## 2018-07-20 NOTE — Telephone Encounter (Signed)
Please advise 

## 2018-07-20 NOTE — Telephone Encounter (Signed)
See note

## 2018-07-20 NOTE — Telephone Encounter (Signed)
Summary: please advise   Pt has loss her appetite for food last 2 days. Pt is drinking ensure and water. Please advise     Patient's family members are reporting the patient is just drinking liquids. She is not eating solids- no pain reported. She is having weakness- she is drinking Ensure. Patient reports bowels and moving  And no nausea.   Family is concerned about patient intake and wants to know how they can get patient to start eating again. They are concerned about weight loss and weakness if she stops eating solids. They are calling for advisement.  Reason for Disposition . Nursing judgment or information in reference  Answer Assessment - Initial Assessment Questions 1. REASON FOR CALL: "What is your main concern right now?"     Patient has stopped eating solids- no appetite 2. ONSET: "When did the lack of appetite start?"     2 days 3. SEVERITY: "How bad is the intake?"     Liquids and Ensure only 4. FEVER: "Do you have a fever?"     no 5. OTHER SYMPTOMS: "Do you have any other new symptoms?"     no 6. INTERVENTIONS AND RESPONSE: "What have you done so far to try to make this better? What medications have you used?"     No snacking 7. PREGNANCY: "Is there any chance you are pregnant?"     n/a  Protocols used: NO GUIDELINE AVAILABLE-A-AH

## 2018-07-21 NOTE — Telephone Encounter (Signed)
Recommend OV to discuss further. Do not need to do anything urgently or emergently is she is taking po fluids and ensure.  Algis Greenhouse. [name], MD 07/21/2018 11:37 AM

## 2018-07-24 NOTE — Telephone Encounter (Signed)
Notified daughter Harley Hallmark voices understanding.She will call back to schedule appt.

## 2018-07-25 ENCOUNTER — Other Ambulatory Visit: Payer: Self-pay | Admitting: Family Medicine

## 2018-07-28 ENCOUNTER — Ambulatory Visit (INDEPENDENT_AMBULATORY_CARE_PROVIDER_SITE_OTHER): Payer: Medicare Other

## 2018-07-28 ENCOUNTER — Other Ambulatory Visit: Payer: Self-pay

## 2018-07-28 ENCOUNTER — Emergency Department (HOSPITAL_COMMUNITY): Payer: Medicare Other

## 2018-07-28 ENCOUNTER — Encounter: Payer: Self-pay | Admitting: Family Medicine

## 2018-07-28 ENCOUNTER — Ambulatory Visit (INDEPENDENT_AMBULATORY_CARE_PROVIDER_SITE_OTHER): Payer: Medicare Other | Admitting: Family Medicine

## 2018-07-28 ENCOUNTER — Inpatient Hospital Stay (HOSPITAL_COMMUNITY)
Admission: EM | Admit: 2018-07-28 | Discharge: 2018-08-03 | DRG: 166 | Disposition: A | Discharge status: Home / Self Care | Payer: Medicare Other | Attending: Family Medicine | Admitting: Family Medicine

## 2018-07-28 VITALS — BP 104/56 | HR 72 | Temp 97.5°F | Ht 65.0 in | Wt 135.2 lb

## 2018-07-28 DIAGNOSIS — Z7984 Long term (current) use of oral hypoglycemic drugs: Secondary | ICD-10-CM

## 2018-07-28 DIAGNOSIS — E1159 Type 2 diabetes mellitus with other circulatory complications: Secondary | ICD-10-CM | POA: Diagnosis not present

## 2018-07-28 DIAGNOSIS — R05 Cough: Secondary | ICD-10-CM

## 2018-07-28 DIAGNOSIS — Z9981 Dependence on supplemental oxygen: Secondary | ICD-10-CM

## 2018-07-28 DIAGNOSIS — R630 Anorexia: Secondary | ICD-10-CM | POA: Diagnosis not present

## 2018-07-28 DIAGNOSIS — Z7951 Long term (current) use of inhaled steroids: Secondary | ICD-10-CM

## 2018-07-28 DIAGNOSIS — M1990 Unspecified osteoarthritis, unspecified site: Secondary | ICD-10-CM | POA: Diagnosis present

## 2018-07-28 DIAGNOSIS — N183 Chronic kidney disease, stage 3 (moderate): Secondary | ICD-10-CM | POA: Diagnosis present

## 2018-07-28 DIAGNOSIS — Z6822 Body mass index (BMI) 22.0-22.9, adult: Secondary | ICD-10-CM

## 2018-07-28 DIAGNOSIS — R042 Hemoptysis: Secondary | ICD-10-CM | POA: Diagnosis present

## 2018-07-28 DIAGNOSIS — J432 Centrilobular emphysema: Secondary | ICD-10-CM | POA: Diagnosis not present

## 2018-07-28 DIAGNOSIS — E440 Moderate protein-calorie malnutrition: Secondary | ICD-10-CM | POA: Diagnosis present

## 2018-07-28 DIAGNOSIS — Z7901 Long term (current) use of anticoagulants: Secondary | ICD-10-CM

## 2018-07-28 DIAGNOSIS — R911 Solitary pulmonary nodule: Secondary | ICD-10-CM

## 2018-07-28 DIAGNOSIS — I700 Atherosclerosis of aorta: Secondary | ICD-10-CM | POA: Diagnosis present

## 2018-07-28 DIAGNOSIS — J9621 Acute and chronic respiratory failure with hypoxia: Secondary | ICD-10-CM | POA: Diagnosis present

## 2018-07-28 DIAGNOSIS — K08409 Partial loss of teeth, unspecified cause, unspecified class: Secondary | ICD-10-CM | POA: Diagnosis present

## 2018-07-28 DIAGNOSIS — Z950 Presence of cardiac pacemaker: Secondary | ICD-10-CM

## 2018-07-28 DIAGNOSIS — L409 Psoriasis, unspecified: Secondary | ICD-10-CM | POA: Diagnosis present

## 2018-07-28 DIAGNOSIS — E1122 Type 2 diabetes mellitus with diabetic chronic kidney disease: Secondary | ICD-10-CM | POA: Diagnosis present

## 2018-07-28 DIAGNOSIS — I10 Essential (primary) hypertension: Secondary | ICD-10-CM

## 2018-07-28 DIAGNOSIS — J449 Chronic obstructive pulmonary disease, unspecified: Secondary | ICD-10-CM | POA: Diagnosis present

## 2018-07-28 DIAGNOSIS — Z9049 Acquired absence of other specified parts of digestive tract: Secondary | ICD-10-CM

## 2018-07-28 DIAGNOSIS — E1169 Type 2 diabetes mellitus with other specified complication: Secondary | ICD-10-CM | POA: Diagnosis present

## 2018-07-28 DIAGNOSIS — C342 Malignant neoplasm of middle lobe, bronchus or lung: Principal | ICD-10-CM | POA: Diagnosis present

## 2018-07-28 DIAGNOSIS — E785 Hyperlipidemia, unspecified: Secondary | ICD-10-CM | POA: Diagnosis present

## 2018-07-28 DIAGNOSIS — Z87891 Personal history of nicotine dependence: Secondary | ICD-10-CM

## 2018-07-28 DIAGNOSIS — N3000 Acute cystitis without hematuria: Secondary | ICD-10-CM

## 2018-07-28 DIAGNOSIS — I2510 Atherosclerotic heart disease of native coronary artery without angina pectoris: Secondary | ICD-10-CM | POA: Diagnosis present

## 2018-07-28 DIAGNOSIS — I152 Hypertension secondary to endocrine disorders: Secondary | ICD-10-CM | POA: Diagnosis present

## 2018-07-28 DIAGNOSIS — R918 Other nonspecific abnormal finding of lung field: Secondary | ICD-10-CM | POA: Diagnosis not present

## 2018-07-28 DIAGNOSIS — D649 Anemia, unspecified: Secondary | ICD-10-CM | POA: Diagnosis present

## 2018-07-28 DIAGNOSIS — R634 Abnormal weight loss: Secondary | ICD-10-CM | POA: Diagnosis not present

## 2018-07-28 DIAGNOSIS — I712 Thoracic aortic aneurysm, without rupture, unspecified: Secondary | ICD-10-CM

## 2018-07-28 DIAGNOSIS — Z66 Do not resuscitate: Secondary | ICD-10-CM | POA: Diagnosis present

## 2018-07-28 DIAGNOSIS — I482 Chronic atrial fibrillation, unspecified: Secondary | ICD-10-CM | POA: Diagnosis present

## 2018-07-28 DIAGNOSIS — R0602 Shortness of breath: Secondary | ICD-10-CM | POA: Diagnosis present

## 2018-07-28 DIAGNOSIS — Z833 Family history of diabetes mellitus: Secondary | ICD-10-CM

## 2018-07-28 DIAGNOSIS — J439 Emphysema, unspecified: Secondary | ICD-10-CM | POA: Diagnosis present

## 2018-07-28 DIAGNOSIS — Z7982 Long term (current) use of aspirin: Secondary | ICD-10-CM

## 2018-07-28 DIAGNOSIS — Z79899 Other long term (current) drug therapy: Secondary | ICD-10-CM

## 2018-07-28 DIAGNOSIS — R059 Cough, unspecified: Secondary | ICD-10-CM

## 2018-07-28 DIAGNOSIS — R5381 Other malaise: Secondary | ICD-10-CM | POA: Diagnosis not present

## 2018-07-28 DIAGNOSIS — Z880 Allergy status to penicillin: Secondary | ICD-10-CM

## 2018-07-28 DIAGNOSIS — Z86711 Personal history of pulmonary embolism: Secondary | ICD-10-CM

## 2018-07-28 DIAGNOSIS — N390 Urinary tract infection, site not specified: Secondary | ICD-10-CM | POA: Diagnosis present

## 2018-07-28 DIAGNOSIS — R5383 Other fatigue: Secondary | ICD-10-CM

## 2018-07-28 DIAGNOSIS — Z8249 Family history of ischemic heart disease and other diseases of the circulatory system: Secondary | ICD-10-CM

## 2018-07-28 LAB — URINALYSIS, ROUTINE W REFLEX MICROSCOPIC
BILIRUBIN URINE: NEGATIVE
GLUCOSE, UA: NEGATIVE mg/dL
HGB URINE DIPSTICK: NEGATIVE
Ketones, ur: NEGATIVE mg/dL
Nitrite: NEGATIVE
Protein, ur: NEGATIVE mg/dL
Specific Gravity, Urine: 1.027 (ref 1.005–1.030)
pH: 6 (ref 5.0–8.0)

## 2018-07-28 LAB — CBC WITH DIFFERENTIAL/PLATELET
ABS IMMATURE GRANULOCYTES: 0.05 10*3/uL (ref 0.00–0.07)
BASOS ABS: 0 10*3/uL (ref 0.0–0.1)
Basophils Relative: 0 %
Eosinophils Absolute: 0.2 10*3/uL (ref 0.0–0.5)
Eosinophils Relative: 1 %
HCT: 31.4 % — ABNORMAL LOW (ref 36.0–46.0)
Hemoglobin: 10.3 g/dL — ABNORMAL LOW (ref 12.0–15.0)
IMMATURE GRANULOCYTES: 0 %
Lymphocytes Relative: 10 %
Lymphs Abs: 1.2 10*3/uL (ref 0.7–4.0)
MCH: 25.1 pg — ABNORMAL LOW (ref 26.0–34.0)
MCHC: 32.8 g/dL (ref 30.0–36.0)
MCV: 76.6 fL — ABNORMAL LOW (ref 80.0–100.0)
Monocytes Absolute: 0.9 10*3/uL (ref 0.1–1.0)
Monocytes Relative: 7 %
NEUTROS ABS: 10.2 10*3/uL — AB (ref 1.7–7.7)
Neutrophils Relative %: 82 %
Platelets: 347 10*3/uL (ref 150–400)
RBC: 4.1 MIL/uL (ref 3.87–5.11)
RDW: 15.3 % (ref 11.5–15.5)
WBC: 12.6 10*3/uL — ABNORMAL HIGH (ref 4.0–10.5)
nRBC: 0 % (ref 0.0–0.2)

## 2018-07-28 LAB — COMPREHENSIVE METABOLIC PANEL
ALT: 33 U/L (ref 0–44)
AST: 35 U/L (ref 15–41)
Albumin: 3.2 g/dL — ABNORMAL LOW (ref 3.5–5.0)
Alkaline Phosphatase: 132 U/L — ABNORMAL HIGH (ref 38–126)
Anion gap: 14 (ref 5–15)
BUN: 35 mg/dL — ABNORMAL HIGH (ref 8–23)
CO2: 29 mmol/L (ref 22–32)
Calcium: 8.9 mg/dL (ref 8.9–10.3)
Chloride: 95 mmol/L — ABNORMAL LOW (ref 98–111)
Creatinine, Ser: 1.33 mg/dL — ABNORMAL HIGH (ref 0.44–1.00)
GFR calc Af Amer: 42 mL/min — ABNORMAL LOW (ref >60)
GFR calc non Af Amer: 37 mL/min — ABNORMAL LOW (ref >60)
Glucose, Bld: 83 mg/dL (ref 70–99)
Potassium: 4.8 mmol/L (ref 3.5–5.1)
Sodium: 138 mmol/L (ref 135–145)
Total Bilirubin: 0.6 mg/dL (ref 0.3–1.2)
Total Protein: 8.4 g/dL — ABNORMAL HIGH (ref 6.5–8.1)

## 2018-07-28 LAB — I-STAT TROPONIN, ED: Troponin i, poc: 0 ng/mL (ref 0.00–0.08)

## 2018-07-28 LAB — PROTIME-INR
INR: 3.97
Prothrombin Time: 38.1 seconds — ABNORMAL HIGH (ref 11.4–15.2)

## 2018-07-28 LAB — BRAIN NATRIURETIC PEPTIDE: B Natriuretic Peptide: 108.2 pg/mL — ABNORMAL HIGH (ref 0.0–100.0)

## 2018-07-28 MED ORDER — LOSARTAN POTASSIUM 25 MG PO TABS
25.0000 mg | ORAL_TABLET | Freq: Every day | ORAL | Status: DC
  Administered 2018-07-29: 25 mg via ORAL
  Filled 2018-07-28 (×2): qty 1

## 2018-07-28 MED ORDER — TAZAROTENE 0.1 % EX FOAM: Freq: Every day | CUTANEOUS | Status: DC

## 2018-07-28 MED ORDER — OMEGA-3-ACID ETHYL ESTERS 1 G PO CAPS
2.0000 g | ORAL_CAPSULE | Freq: Two times a day (BID) | ORAL | Status: DC
  Administered 2018-07-28 – 2018-08-03 (×11): 2 g via ORAL
  Filled 2018-07-28 (×11): qty 2

## 2018-07-28 MED ORDER — IOPAMIDOL (ISOVUE-370) INJECTION 76%
100.0000 mL | Freq: Once | INTRAVENOUS | Status: AC | PRN
  Administered 2018-07-28: 100 mL via INTRAVENOUS

## 2018-07-28 MED ORDER — METOPROLOL TARTRATE 50 MG PO TABS
100.0000 mg | ORAL_TABLET | Freq: Two times a day (BID) | ORAL | Status: DC
  Administered 2018-07-28 – 2018-08-03 (×11): 100 mg via ORAL
  Filled 2018-07-28 (×3): qty 4
  Filled 2018-07-28: qty 2
  Filled 2018-07-28 (×2): qty 4
  Filled 2018-07-28: qty 2
  Filled 2018-07-28 (×4): qty 4

## 2018-07-28 MED ORDER — MOMETASONE FURO-FORMOTEROL FUM 100-5 MCG/ACT IN AERO
2.0000 | INHALATION_SPRAY | Freq: Two times a day (BID) | RESPIRATORY_TRACT | Status: DC
  Filled 2018-07-28: qty 8.8

## 2018-07-28 MED ORDER — ONDANSETRON HCL 4 MG PO TABS: 4.0000 mg | ORAL_TABLET | Freq: Four times a day (QID) | ORAL | Status: DC | PRN

## 2018-07-28 MED ORDER — VITAMIN D 25 MCG (1000 UNIT) PO TABS
1000.0000 [IU] | ORAL_TABLET | Freq: Every day | ORAL | Status: DC
  Administered 2018-07-29 – 2018-08-03 (×5): 1000 [IU] via ORAL
  Filled 2018-07-28 (×5): qty 1

## 2018-07-28 MED ORDER — ACETAMINOPHEN 325 MG PO TABS
650.0000 mg | ORAL_TABLET | Freq: Four times a day (QID) | ORAL | Status: DC | PRN
  Administered 2018-07-30 – 2018-08-03 (×6): 650 mg via ORAL
  Filled 2018-07-28 (×6): qty 2

## 2018-07-28 MED ORDER — TIOTROPIUM BROMIDE MONOHYDRATE 2.5 MCG/ACT IN AERS: 2.0000 | INHALATION_SPRAY | Freq: Every day | RESPIRATORY_TRACT | Status: DC

## 2018-07-28 MED ORDER — SODIUM CHLORIDE (PF) 0.9 % IJ SOLN
INTRAMUSCULAR | Status: AC
  Filled 2018-07-28: qty 50

## 2018-07-28 MED ORDER — CIPROFLOXACIN IN D5W 400 MG/200ML IV SOLN: 400.0000 mg | Freq: Once | INTRAVENOUS | Status: DC

## 2018-07-28 MED ORDER — ENSURE ENLIVE PO LIQD
237.0000 mL | Freq: Once | ORAL | Status: AC
  Administered 2018-07-28: 237 mL via ORAL
  Filled 2018-07-28: qty 237

## 2018-07-28 MED ORDER — DM-GUAIFENESIN ER 30-600 MG PO TB12
1.0000 | ORAL_TABLET | Freq: Two times a day (BID) | ORAL | Status: DC
  Administered 2018-07-28 – 2018-08-03 (×11): 1 via ORAL
  Filled 2018-07-28 (×11): qty 1

## 2018-07-28 MED ORDER — UMECLIDINIUM BROMIDE 62.5 MCG/INH IN AEPB
1.0000 | INHALATION_SPRAY | Freq: Every day | RESPIRATORY_TRACT | Status: DC
  Filled 2018-07-28: qty 7

## 2018-07-28 MED ORDER — ACETAMINOPHEN 650 MG RE SUPP: 650.0000 mg | Freq: Four times a day (QID) | RECTAL | Status: DC | PRN

## 2018-07-28 MED ORDER — SODIUM CHLORIDE 0.9 % IV SOLN
1.0000 g | INTRAVENOUS | Status: DC
  Administered 2018-07-28 – 2018-07-30 (×3): 1 g via INTRAVENOUS
  Filled 2018-07-28: qty 10
  Filled 2018-07-28 (×2): qty 1

## 2018-07-28 MED ORDER — ONDANSETRON HCL 4 MG/2ML IJ SOLN: 4.0000 mg | Freq: Four times a day (QID) | INTRAMUSCULAR | Status: DC | PRN

## 2018-07-28 MED ORDER — TRIAMTERENE-HCTZ 37.5-25 MG PO CAPS
1.0000 | ORAL_CAPSULE | Freq: Every day | ORAL | Status: DC
  Administered 2018-07-29: 1 via ORAL
  Filled 2018-07-28 (×2): qty 1

## 2018-07-28 MED ORDER — ADULT MULTIVITAMIN W/MINERALS CH
1.0000 | ORAL_TABLET | Freq: Every day | ORAL | Status: DC
  Administered 2018-07-29 – 2018-08-03 (×5): 1 via ORAL
  Filled 2018-07-28 (×5): qty 1

## 2018-07-28 MED ORDER — PRAVASTATIN SODIUM 20 MG PO TABS
20.0000 mg | ORAL_TABLET | Freq: Every day | ORAL | Status: DC
  Administered 2018-07-29 – 2018-08-03 (×5): 20 mg via ORAL
  Filled 2018-07-28 (×5): qty 1

## 2018-07-28 MED ORDER — PANTOPRAZOLE SODIUM 40 MG PO TBEC
40.0000 mg | DELAYED_RELEASE_TABLET | Freq: Every day | ORAL | Status: DC
  Administered 2018-07-29 – 2018-08-03 (×5): 40 mg via ORAL
  Filled 2018-07-28 (×5): qty 1

## 2018-07-28 MED ORDER — ENSURE ENLIVE PO LIQD
237.0000 mL | Freq: Two times a day (BID) | ORAL | Status: DC
  Administered 2018-07-28 – 2018-08-01 (×9): 237 mL via ORAL
  Filled 2018-07-28 (×2): qty 237

## 2018-07-28 MED ORDER — ALBUTEROL SULFATE HFA 108 (90 BASE) MCG/ACT IN AERS: 2.0000 | INHALATION_SPRAY | Freq: Four times a day (QID) | RESPIRATORY_TRACT | Status: DC | PRN

## 2018-07-28 MED ORDER — INSULIN ASPART 100 UNIT/ML ~~LOC~~ SOLN
0.0000 [IU] | Freq: Three times a day (TID) | SUBCUTANEOUS | Status: DC
  Administered 2018-07-29: 3 [IU] via SUBCUTANEOUS
  Administered 2018-07-29: 8 [IU] via SUBCUTANEOUS
  Administered 2018-07-29 – 2018-07-30 (×3): 3 [IU] via SUBCUTANEOUS
  Administered 2018-07-30: 8 [IU] via SUBCUTANEOUS
  Administered 2018-07-31: 3 [IU] via SUBCUTANEOUS
  Administered 2018-07-31: 8 [IU] via SUBCUTANEOUS
  Administered 2018-07-31 – 2018-08-01 (×2): 3 [IU] via SUBCUTANEOUS
  Administered 2018-08-01 (×2): 5 [IU] via SUBCUTANEOUS
  Administered 2018-08-03 (×2): 11 [IU] via SUBCUTANEOUS

## 2018-07-28 MED ORDER — VITAMIN D3 25 MCG (1000 UT) PO CAPS: 1.0000 | ORAL_CAPSULE | Freq: Every day | ORAL | Status: DC

## 2018-07-28 MED ORDER — IOPAMIDOL (ISOVUE-370) INJECTION 76%
INTRAVENOUS | Status: AC
  Filled 2018-07-28: qty 100

## 2018-07-28 MED ORDER — ONE-DAILY MULTI VITAMINS PO TABS: 1.0000 | ORAL_TABLET | Freq: Every day | ORAL | Status: DC

## 2018-07-28 MED ORDER — GABAPENTIN 100 MG PO CAPS: 100.0000 mg | ORAL_CAPSULE | Freq: Three times a day (TID) | ORAL | Status: DC | PRN

## 2018-07-28 MED ORDER — ALBUTEROL SULFATE (2.5 MG/3ML) 0.083% IN NEBU: 2.5000 mg | INHALATION_SOLUTION | Freq: Four times a day (QID) | RESPIRATORY_TRACT | Status: DC | PRN

## 2018-07-28 MED ORDER — ADAPALENE 0.1 % EX GEL: 1.0000 "application " | Freq: Every day | CUTANEOUS | Status: DC

## 2018-07-28 NOTE — ED Notes (Signed)
Report called to receiving RN.

## 2018-07-28 NOTE — Patient Instructions (Signed)
It was very nice to see you today!  You need a CT scan of your lungs.  Please go to the emergency department to get this done along with blood work.   Take care, Dr [name]

## 2018-07-28 NOTE — ED Provider Notes (Signed)
3:48 PM Care assumed from Dr. Maryan Rued.  At time of transfer care, patient is awaiting reassessment after laboratory and imaging testing.  Patient was found to have abnormalities on chest x-ray concerning for either mass, pneumonia, or pulmonary embolism.  Patient will have a CT PE study as well as labs to look for etiology of her cough, shortness of breath, hemoptysis, and other symptoms.  Anticipate reassessment after work-up to determine disposition.  Previous team was concerned that she may need admission.  6:59 PM Went back to reassess patient.  Patient's oxygen saturation was 83% on her home 2 L.  Patient was doubled to 4 L and had some improvement in her oxygen saturations to the low 90s.  Work-up shows the patient has a leukocytosis and mild anemia.  Patient also has evidence of urinary tract infection in the setting of her reported hesitancy and difficulty with urination.  She does report that it has been darker.  Patient was given antibiotics urinary tract infection as I suspect this is contributing to her severe fatigue, malaise, decreased appetite, and overall feeling poor.  BNP was slightly elevated however CT scan does not show evidence of fluid overload in the lungs.  There was evidence of a growing 4 cm mass which is likely the cause of her hemoptysis and potentially contributing to her shortness of breath.  Due to the patient's continued tachycardia, doubling of her oxygen requirement, possible urinary tract infection with leukocytosis and urinalysis and urinary symptoms, as well as her growing mass in her lungs with hemoptysis, patient will be given antibiotics for UTI and admitted for further management.  Hospitalist team will be called for admission.   Mabeline Varas, Gwenyth Allegra, MD 07/28/18 [id number]

## 2018-07-28 NOTE — ED Notes (Signed)
Admitting RN at bedside.

## 2018-07-28 NOTE — Progress Notes (Signed)
Patient's daughter would like to be called when the patient is going to her new room.

## 2018-07-28 NOTE — Progress Notes (Signed)
ED TO INPATIENT HANDOFF REPORT  Name/Age/Gender Alejandra [name] 85 y.o. female  Code Status    Code Status Orders  (From admission, onward)         Start     Ordered   07/28/18 2015  Do not attempt resuscitation (DNR)  Continuous    Question Answer Comment  In the event of cardiac or respiratory ARREST Do not call a "code blue"   In the event of cardiac or respiratory ARREST Do not perform Intubation, CPR, defibrillation or ACLS   In the event of cardiac or respiratory ARREST Use medication by any route, position, wound care, and other measures to relive pain and suffering. May use oxygen, suction and manual treatment of airway obstruction as needed for comfort.      07/28/18 2014        Code Status History    Date Active Date Inactive Code Status Order ID Comments User Context   04/07/2018 0829 04/08/2018 2310 DNR [id number]  Barton Dubois, MD Inpatient   02/03/2017 0054 02/05/2017 1902 Full Code [id number]  Edwin Dada, MD Inpatient   10/09/2016 1227 10/16/2016 2013 Full Code [id number]  Maren Reamer, MD ED   10/30/2011 1501 11/01/2011 1817 Full Code [id number]  Burnadette Peter, RN Inpatient   10/29/2011 2138 10/30/2011 1501 Full Code [id number]  Royce Macadamia, RN Inpatient    Advance Directive Documentation     Most Recent Value  Type of Advance Directive  Healthcare Power of Attorney, Living will  Pre-existing out of facility DNR order (yellow form or pink MOST form)  -  "MOST" Form in Place?  -      Home/SNF/Other Home  Chief Complaint emesis with blood  Level of Care/Admitting Diagnosis ED Disposition    ED Disposition Condition [facility] Area: [facility] [100102]  Level of Care: Med-Surg [16]  Diagnosis: Acute on chronic respiratory failure with hypoxia [facility]) [[id number]]  Admitting Physician: Doreatha Massed  Attending Physician: Etta Quill [phone]  Estimated length of stay: past midnight  tomorrow  Certification:: I certify this patient will need inpatient services for at least 2 midnights  PT Class (Do Not Modify): Inpatient [101]  PT Acc Code (Do Not Modify): Private [1]       Medical History Past Medical History:  Diagnosis Date  . AAA (abdominal aortic aneurysm) ([place])   . Arthritis   . COPD (chronic obstructive pulmonary disease) ([place])   . DM (diabetes mellitus) ([place])   . Dyslipidemia   . Hyperlipidemia   . Hypertension   . Left foot infection 2013  . 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 2013  . Pulmonary embolism ([name])     Allergies Allergies  Allergen Reactions  . Penicillins Nausea And Vomiting, Rash and Other (See Comments)    Childhood reaction, tolerated multiple cephs in 2013. Has patient had a PCN reaction causing immediate rash, facial/tongue/throat swelling, SOB or lightheadedness with hypotension: Yes Has patient had a PCN reaction causing severe rash involving mucus membranes or skin necrosis: Yes Has patient had a PCN reaction that required hospitalization: Yes Has patient had a PCN reaction occurring within the last 10 years: No     IV Location/Drains/Wounds Patient Lines/Drains/Airways Status   Active Line/Drains/Airways    Name:   Placement date:   Placement time:   Site:   Days:   Peripheral IV 04/08/18 Left;Anterior Forearm   04/08/18    0545    Forearm  111   Peripheral IV 07/28/18 Right Hand   07/28/18    1451    Hand   less than 1   Peripheral IV 07/28/18 Right Antecubital   07/28/18    1605    Antecubital   less than 1   Incision 10/30/11 Groin Bilateral   10/30/11    1041     2463   Incision (Closed) 10/14/16 Abdomen Other (Comment)   10/14/16    1259     652   Incision - 4 Ports Abdomen 1: Umbilicus 2: Mid;Upper 3: Right;Upper 4: Right;Lower   10/14/16    1242     652          Labs/Imaging Results for orders placed or performed during the hospital encounter of 07/28/18 (from the past 48 hour(s))  Urinalysis, Routine w reflex microscopic      Status: Abnormal   Collection Time: 07/28/18  1:04 PM  Result Value Ref Range   Color, Urine YELLOW YELLOW   APPearance HAZY (A) CLEAR   Specific Gravity, Urine 1.027 1.005 - 1.030   pH 6.0 5.0 - 8.0   Glucose, UA NEGATIVE NEGATIVE mg/dL   Hgb urine dipstick NEGATIVE NEGATIVE   Bilirubin Urine NEGATIVE NEGATIVE   Ketones, ur NEGATIVE NEGATIVE mg/dL   Protein, ur NEGATIVE NEGATIVE mg/dL   Nitrite NEGATIVE NEGATIVE   Leukocytes, UA LARGE (A) NEGATIVE   RBC / HPF 0-5 0 - 5 RBC/hpf   WBC, UA 21-50 0 - 5 WBC/hpf   Bacteria, UA FEW (A) NONE SEEN   Squamous Epithelial / LPF 6-10 0 - 5   Hyaline Casts, UA PRESENT     Comment: Performed at [facility], [place] [address], [place], [place] [address]  CBC with Differential/Platelet     Status: Abnormal   Collection Time: 07/28/18  3:03 PM  Result Value Ref Range   WBC 12.6 (H) 4.0 - 10.5 K/uL   RBC 4.10 3.87 - 5.11 MIL/uL   Hemoglobin 10.3 (L) 12.0 - 15.0 g/dL   HCT 31.4 (L) 36.0 - 46.0 %   MCV 76.6 (L) 80.0 - 100.0 fL   MCH 25.1 (L) 26.0 - 34.0 pg   MCHC 32.8 30.0 - 36.0 g/dL   RDW 15.3 11.5 - 15.5 %   Platelets 347 150 - 400 K/uL    Comment: REPEATED TO VERIFY PLATELET COUNT CONFIRMED BY SMEAR SPECIMEN CHECKED FOR CLOTS    nRBC 0.0 0.0 - 0.2 %   Neutrophils Relative % 82 %   Neutro Abs 10.2 (H) 1.7 - 7.7 K/uL   Lymphocytes Relative 10 %   Lymphs Abs 1.2 0.7 - 4.0 K/uL   Monocytes Relative 7 %   Monocytes Absolute 0.9 0.1 - 1.0 K/uL   Eosinophils Relative 1 %   Eosinophils Absolute 0.2 0.0 - 0.5 K/uL   Basophils Relative 0 %   Basophils Absolute 0.0 0.0 - 0.1 K/uL   Immature Granulocytes 0 %   Abs Immature Granulocytes 0.05 0.00 - 0.07 K/uL   Target Cells PRESENT     Comment: Performed at [facility], [place] [address], [place], [place] [address]  Comprehensive metabolic panel     Status: Abnormal   Collection Time: 07/28/18  3:03 PM  Result Value Ref Range   Sodium 138 135 - 145 mmol/L    Potassium 4.8 3.5 - 5.1 mmol/L   Chloride 95 (L) 98 - 111 mmol/L   CO2 29 22 - 32 mmol/L  Glucose, Bld 83 70 - 99 mg/dL   BUN 35 (H) 8 - 23 mg/dL   Creatinine, Ser 1.33 (H) 0.44 - 1.00 mg/dL   Calcium 8.9 8.9 - 10.3 mg/dL   Total Protein 8.4 (H) 6.5 - 8.1 g/dL   Albumin 3.2 (L) 3.5 - 5.0 g/dL   AST 35 15 - 41 U/L   ALT 33 0 - 44 U/L   Alkaline Phosphatase 132 (H) 38 - 126 U/L   Total Bilirubin 0.6 0.3 - 1.2 mg/dL   GFR calc non Af Amer 37 (L) >60 mL/min   GFR calc Af Amer 42 (L) >60 mL/min   Anion gap 14 5 - 15    Comment: Performed at [facility], [place] [address] [place], [place] [address]  Brain natriuretic peptide     Status: Abnormal   Collection Time: 07/28/18  3:04 PM  Result Value Ref Range   B Natriuretic Peptide 108.2 (H) 0.0 - 100.0 pg/mL    Comment: Performed at [facility], [place] [address], [place], [place] [address]  Protime-INR     Status: Abnormal   Collection Time: 07/28/18  3:13 PM  Result Value Ref Range   Prothrombin Time 38.1 (H) 11.4 - 15.2 seconds   INR 3.97     Comment: Performed at [facility], [place] [address] [place], [place] [address]  I-stat troponin, ED     Status: None   Collection Time: 07/28/18  4:17 PM  Result Value Ref Range   Troponin i, poc 0.00 0.00 - 0.08 ng/mL   Comment 3            Comment: Due to the release kinetics of cTnI, a negative result within the first hours of the onset of symptoms does not rule out myocardial infarction with certainty. If myocardial infarction is still suspected, repeat the test at appropriate intervals.    Dg Chest 2 View  Result Date: 07/28/2018 CLINICAL DATA:  Hemoptysis EXAM: CHEST - 2 VIEW COMPARISON:  04/06/2018 FINDINGS: New mass lesion right middle lobe measuring approximately 3 cm. Dual lead pacemaker. Heart size upper normal. Negative for heart failure. No effusion. Mild hyperinflation of the lungs IMPRESSION: 3 cm right middle lobe mass,  new since 04/06/2018. This is concerning for neoplasm however given its rapid development could be an area of infection. Followup PA and lateral chest X-ray is recommended in 3-4 weeks following trial of antibiotic therapy to ensure resolution and exclude underlying malignancy. Electronically Signed   By: Franchot Gallo M.D.   On: 07/28/2018 10:59   Ct Angio Chest Pe W And/or Wo Contrast  Result Date: 07/28/2018 CLINICAL DATA:  hemoptysis. +weakness, cough and decrease in PO intake x 3 weeks EXAM: CT ANGIOGRAPHY CHEST WITH CONTRAST TECHNIQUE: Multidetector CT imaging of the chest was performed using the standard protocol during bolus administration of intravenous contrast. Multiplanar CT image reconstructions and MIPs were obtained to evaluate the vascular anatomy. CONTRAST:  177mL ISOVUE-370 IOPAMIDOL (ISOVUE-370) INJECTION 76% COMPARISON:  02/02/2017 FINDINGS: Cardiovascular: Left subclavian dual lead transvenous pacemaker, leads extending into the right atrium and towards the right ventricular apex. Heart size normal. No pericardial effusion. Satisfactory opacification of pulmonary arteries noted, and there is no evidence of pulmonary emboli. Coronary calcifications. There is good contrast opacification of the thoracic aorta. There is eccentric aneurysmal dilatation of the distal arch up to 3.8 cm diameter, aorta returning to normal caliber of 3 cm in the proximal descending segment. Moderate scattered calcified  atheromatous plaque in the arch and descending thoracic segment. There is some eccentric nonocclusive mural thrombus in the visualized proximal abdominal aorta. Tines of infrarenal stent graft are partially visualized. Classic 3 vessel brachiocephalic arterial origin anatomy without proximal stenosis. Mediastinum/Nodes: No hilar or mediastinal adenopathy. Lungs/Pleura: No pleural effusion. No pneumothorax. Pulmonary emphysema (ICD10-J43.9). 4 cm spiculated mass in the right middle lobe, previously 1  cm. 2 subpleural satellite nodules measuring 6 and 10 mm are relatively stable. Smaller nonspecific nodules in the posterior upper lobes are stable. Upper Abdomen: Cholecystectomy clips. Stable bilateral adrenal fullness. No acute findings. Musculoskeletal: Old healed sternal fracture. No acute fracture or worrisome bone lesion. Review of the MIP images confirms the above findings. IMPRESSION: 1. Negative for acute PE or thoracic aortic dissection. 2. Enlarging 4 cm right middle lobe mass suggesting primary bronchogenic carcinoma. Consider pulmonary/thoracic consultation. 3. Coronary and Aortic Atherosclerosis (ICD10-170.0). 4. 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 Recommend annual imaging followup by CTA or MRA. This recommendation follows 2010 ACCF/AHA/AATS/ACR/ASA/SCA/SCAI/SIR/STS/SVM Guidelines for the Diagnosis and Management of Patients with Thoracic Aortic Disease. Circulation.2010; 121: O270-J500 Electronically Signed   By: Lucrezia Europe M.D.   On: 07/28/2018 17:03    Pending Labs Unresulted Labs (From admission, onward)    Start     Ordered   07/28/18 2027  Culture, Urine  Once,   R     07/28/18 2026          Vitals/Pain Today's Vitals   07/28/18 1530 07/28/18 1750 07/28/18 1830 07/28/18 1900  BP: 131/69 112/65 (!) 161/88 (!) 142/87  Pulse: 91 (!) 105  (!) 105  Resp: 18 (!) 25 (!) 30 (!) 25  Temp:      SpO2:  100%  95%  PainSc:        Isolation Precautions No active isolations  Medications Medications  feeding supplement (ENSURE ENLIVE) (ENSURE ENLIVE) liquid 237 mL (237 mLs Oral Given 07/28/18 1724)  sodium chloride (PF) 0.9 % injection (has no administration in time range)  iopamidol (ISOVUE-370) 76 % injection (has no administration in time range)  cefTRIAXone (ROCEPHIN) 1 g in sodium chloride 0.9 % 100 mL IVPB (1 g Intravenous New Bag/Given 07/28/18 2034)  mometasone-formoterol (DULERA) 100-5 MCG/ACT inhaler 2 puff (has no administration in time range)   albuterol (PROVENTIL HFA;VENTOLIN HFA) 108 (90 Base) MCG/ACT inhaler 2 puff (has no administration in time range)  adapalene (DIFFERIN) 0.1 % gel 1 application (has no administration in time range)  gabapentin (NEURONTIN) capsule 100 mg (has no administration in time range)  dextromethorphan-guaiFENesin (MUCINEX DM) 30-600 MG per 12 hr tablet 1 tablet (has no administration in time range)  Vitamin D3 CAPS 1,000 Units (has no administration in time range)  Tiotropium Bromide Monohydrate AERS 2 puff (has no administration in time range)  Tazarotene 0.1 % FOAM (has no administration in time range)  pantoprazole (PROTONIX) EC tablet 40 mg (has no administration in time range)  multivitamin tablet 1 tablet (has no administration in time range)  omega-3 acid ethyl esters (LOVAZA) capsule 2 g (has no administration in time range)  pravastatin (PRAVACHOL) tablet 20 mg (has no administration in time range)  losartan (COZAAR) tablet 25 mg (has no administration in time range)  triamterene-hydrochlorothiazide (DYAZIDE) 37.5-25 MG per capsule 1 capsule (has no administration in time range)  metoprolol tartrate (LOPRESSOR) tablet 100 mg (has no administration in time range)  insulin aspart (novoLOG) injection 0-15 Units (has no administration in time range)  acetaminophen (TYLENOL) tablet  650 mg (has no administration in time range)    Or  acetaminophen (TYLENOL) suppository 650 mg (has no administration in time range)  ondansetron (ZOFRAN) tablet 4 mg (has no administration in time range)    Or  ondansetron (ZOFRAN) injection 4 mg (has no administration in time range)  iopamidol (ISOVUE-370) 76 % injection 100 mL (100 mLs Intravenous Contrast Given 07/28/18 1629)  feeding supplement (ENSURE ENLIVE) (ENSURE ENLIVE) liquid 237 mL (237 mLs Oral Given 07/28/18 2034)    Mobility walks with device

## 2018-07-28 NOTE — ED Triage Notes (Signed)
Sent by PCP Dimas Chyle for further evaluation of hemoptysis. +weakness, cough and decrease in PO intake x 3 weeks. Sent here for CT scan of lungs per paperwork. +oxygen dependent

## 2018-07-28 NOTE — H&P (Signed)
History and Physical    Alejandra [name] ZOX:[id number] DOB: [birth year] DOA: 07/28/2018  PCP: Vivi Barrack, MD  Patient coming from: Home  I have personally briefly reviewed patient's old medical records in [place]  Chief Complaint: Hemoptysis  HPI: Alejandra [name] is a 85 y.o. female with medical history significant of COPD on 1-2L O2 at baseline, DM2, HTN, prior PE.  Patient presents to the ED with 2-3 week history of hemoptysis.  Cough perisistent, hemoptysis intermittent.  Wt loss, generalized weakness, and poor appetite.  No fever nor chills.  No N/V/D.  PCPs office CXR showed lung mass, sent in for further work up.   ED Course: CT scan shows 4cm RML lung mass, likely primary bronchogenic carcinoma.  UA shows UTI.   Review of Systems: As per HPI otherwise 10 point review of systems negative.   Past Medical History:  Diagnosis Date  . AAA (abdominal aortic aneurysm) ([place])   . Arthritis   . COPD (chronic obstructive pulmonary disease) ([place])   . DM (diabetes mellitus) ([place])   . Dyslipidemia   . Hyperlipidemia   . Hypertension   . Left foot infection 2013  . 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 2013  . Pulmonary embolism [facility])     Past Surgical History:  Procedure Laterality Date  . ABDOMINAL AORTIC ANEURYSM REPAIR  10-30-2011  . CHOLECYSTECTOMY N/A 10/14/2016   Procedure: LAPAROSCOPIC CHOLECYSTECTOMY;  Surgeon: Clovis Riley, MD;  Location: [place];  Service: General;  Laterality: N/A;  . ENDOVASCULAR STENT INSERTION  10/30/2011   Procedure: ENDOVASCULAR STENT GRAFT INSERTION;  Surgeon: Serafina Mitchell, MD;  Location: [place];  Service: Vascular;  Laterality: N/A;  . PACEMAKER IMPLANT N/A 02/04/2017   Procedure: Pacemaker Implant;  Surgeon: Constance Haw, MD;  Location: [place] [facility];  Service: Cardiovascular;  Laterality: N/A;  . VESICOVAGINAL FISTULA CLOSURE W/ TAH       reports that she quit smoking about 8 years ago. Her smoking use included cigarettes. She  has a 27.00 pack-year smoking history. She has never used smokeless tobacco. She reports that she does not drink alcohol or use drugs.  Allergies  Allergen Reactions  . Penicillins Nausea And Vomiting, Rash and Other (See Comments)    Childhood reaction, tolerated multiple cephs in 2013. Has patient had a PCN reaction causing immediate rash, facial/tongue/throat swelling, SOB or lightheadedness with hypotension: Yes Has patient had a PCN reaction causing severe rash involving mucus membranes or skin necrosis: Yes Has patient had a PCN reaction that required hospitalization: Yes Has patient had a PCN reaction occurring within the last 10 years: No     Family History  Problem Relation Age of Onset  . Heart disease Mother   . Hypertension Mother   . Heart attack Mother   . Hypertension Father   . Diabetes Father   . Heart attack Father   . Heart disease Sister        x 3  . Hypertension Sister   . Heart attack Sister   . Clotting disorder Daughter   . Clotting disorder Son        multiple sons  . Diabetes Son   . Hypertension Brother      Prior to Admission medications   Medication Sig Start Date End Date Taking? Authorizing Provider  acetaminophen (TYLENOL) 500 MG tablet Take 1,000 mg by mouth 2 (two) times daily as needed for mild pain.   Yes [provider]  adapalene (DIFFERIN) 0.1 %  gel APPLY TO SCALP PSORIASIS ONCE DAILY Patient taking differently: Apply 1 application topically daily.  05/25/18  Yes Vivi Barrack, MD  albuterol (PROVENTIL HFA;VENTOLIN HFA) 108 (90 Base) MCG/ACT inhaler Inhale 2 puffs into the lungs every 6 (six) hours as needed for wheezing or shortness of breath. 11/23/17  Yes Vivi Barrack, MD  aspirin 81 MG tablet Take 81 mg by mouth daily.     Yes [provider]  budesonide-formoterol (SYMBICORT) 80-4.5 MCG/ACT inhaler Inhale 2 puffs into the lungs 2 (two) times daily. 11/23/17  Yes Vivi Barrack, MD  Cholecalciferol (VITAMIN D3)  1000 UNITS CAPS Take 1 capsule by mouth daily.     Yes [provider]  dextromethorphan-guaiFENesin (MUCINEX DM) 30-600 MG 12hr tablet Take 1 tablet by mouth 2 (two) times daily. 04/08/18  Yes Barton Dubois, MD  gabapentin (NEURONTIN) 100 MG capsule Start with 1 tab po qhs X 1 week, then increase to 1 tab po bid X 1 week then 1 tab po tid prn Patient taking differently: Take 100 mg by mouth 3 (three) times daily as needed (for pain).  11/15/17  Yes Gerda Diss, DO  Garlic 8546 MG CAPS Take 1,000 mg by mouth daily.    Yes [provider]  glipiZIDE (GLUCOTROL) 5 MG tablet TAKE 1 TABLET(5 MG) BY MOUTH TWICE DAILY BEFORE A MEAL Patient taking differently: Take 5 mg by mouth 2 (two) times daily before a meal.  07/25/18  Yes Vivi Barrack, MD  losartan (COZAAR) 25 MG tablet Take 1 tablet (25 mg total) by mouth daily. 04/28/18  Yes Vivi Barrack, MD  lovastatin (MEVACOR) 20 MG tablet Take 1 tablet (20 mg total) by mouth at bedtime. 09/28/17  Yes Vivi Barrack, MD  metFORMIN (GLUCOPHAGE) 500 MG tablet TAKE 1 TABLET(500 MG) BY MOUTH TWICE DAILY WITH A MEAL Patient taking differently: Take 500 mg by mouth 2 (two) times daily with a meal.  07/25/18  Yes Vivi Barrack, MD  Multiple Vitamin (MULTIVITAMIN) tablet Take 1 tablet by mouth daily.     Yes [provider]  omega-3 acid ethyl esters (LOVAZA) 1 G capsule Take 2 g by mouth 2 (two) times daily.   Yes [provider]  pantoprazole (PROTONIX) 40 MG tablet Take 1 tablet (40 mg total) by mouth daily. 04/09/18  Yes Barton Dubois, MD  Tazarotene 0.1 % FOAM Apply to scalp psoriasis 1 time daily. Patient taking differently: Apply topically daily. Apply to scalp psoriasis 01/04/18  Yes Vivi Barrack, MD  Tiotropium Bromide Monohydrate (SPIRIVA RESPIMAT) 2.5 MCG/ACT AERS Inhale 2 puffs into the lungs daily. 04/05/18  Yes Vivi Barrack, MD  triamcinolone ointment (KENALOG) 0.5 % APPLY TOPICALLY TO THE AFFECTED AREA TWICE  DAILY Patient taking differently: Apply 1 application topically 2 (two) times daily.  05/25/18  Yes Vivi Barrack, MD  triamterene-hydrochlorothiazide (DYAZIDE) 37.5-25 MG capsule Take 1 each (1 capsule total) by mouth daily. 02/16/18  Yes Vivi Barrack, MD  warfarin (COUMADIN) 5 MG tablet Take 1-1.5 tablets (5-7.5 mg total) by mouth See admin instructions. Take 1 and 1/2 tablets on Monday then take 1 tablet the other days Patient taking differently: Take 5-7.5 mg by mouth See admin instructions. Take 7.5 mg by mouth on Monday then take 5 mg by mouth on the other days 05/01/18  Yes Vivi Barrack, MD  metoprolol tartrate (LOPRESSOR) 100 MG tablet Take 1 tablet (100 mg total) by mouth 2 (two) times daily.  02/08/18 05/09/18  Constance Haw, MD    Physical Exam: Vitals:   07/28/18 1530 07/28/18 1750 07/28/18 1830 07/28/18 1900  BP: 131/69 112/65 (!) 161/88 (!) 142/87  Pulse: 91 (!) 105  (!) 105  Resp: 18 (!) 25 (!) 30 (!) 25  Temp:      SpO2:  100%  95%    Constitutional: NAD, calm, comfortable Eyes: PERRL, lids and conjunctivae normal ENMT: Mucous membranes are moist. Posterior pharynx clear of any exudate or lesions.Normal dentition.  Neck: normal, supple, no masses, no thyromegaly Respiratory: clear to auscultation bilaterally, no wheezing, no crackles. Normal respiratory effort. No accessory muscle use.  Cardiovascular: Regular rate and rhythm, no murmurs / rubs / gallops. No extremity edema. 2+ pedal pulses. No carotid bruits.  Abdomen: no tenderness, no masses palpated. No hepatosplenomegaly. Bowel sounds positive.  Musculoskeletal: no clubbing / cyanosis. No joint deformity upper and lower extremities. Good ROM, no contractures. Normal muscle tone.  Skin: no rashes, lesions, ulcers. No induration Neurologic: CN 2-12 grossly intact. Sensation intact, DTR normal. Strength 5/5 in all 4.  Psychiatric: Normal judgment and insight. Alert and oriented x 3. Normal mood.    Labs on  Admission: I have personally reviewed following labs and imaging studies  CBC: Recent Labs  Lab 07/28/18 1503  WBC 12.6*  NEUTROABS 10.2*  HGB 10.3*  HCT 31.4*  MCV 76.6*  PLT 354   Basic Metabolic Panel: Recent Labs  Lab 07/28/18 1503  NA 138  K 4.8  CL 95*  CO2 29  GLUCOSE 83  BUN 35*  CREATININE 1.33*  CALCIUM 8.9   GFR: Estimated Creatinine Clearance: 28.3 mL/min (A) (by C-G formula based on SCr of 1.33 mg/dL (H)). Liver Function Tests: Recent Labs  Lab 07/28/18 1503  AST 35  ALT 33  ALKPHOS 132*  BILITOT 0.6  PROT 8.4*  ALBUMIN 3.2*   No results for input(s): LIPASE, AMYLASE in the last 168 hours. No results for input(s): AMMONIA in the last 168 hours. Coagulation Profile: Recent Labs  Lab 07/28/18 1513  INR 3.97   Cardiac Enzymes: No results for input(s): CKTOTAL, CKMB, CKMBINDEX, TROPONINI in the last 168 hours. BNP (last 3 results) No results for input(s): PROBNP in the last 8760 hours. HbA1C: No results for input(s): HGBA1C in the last 72 hours. CBG: No results for input(s): GLUCAP in the last 168 hours. Lipid Profile: No results for input(s): CHOL, HDL, LDLCALC, TRIG, CHOLHDL, LDLDIRECT in the last 72 hours. Thyroid Function Tests: No results for input(s): TSH, T4TOTAL, FREET4, T3FREE, THYROIDAB in the last 72 hours. Anemia Panel: No results for input(s): VITAMINB12, FOLATE, FERRITIN, TIBC, IRON, RETICCTPCT in the last 72 hours. Urine analysis:    Component Value Date/Time   COLORURINE YELLOW 07/28/2018 1304   APPEARANCEUR HAZY (A) 07/28/2018 1304   LABSPEC 1.027 07/28/2018 1304   PHURINE 6.0 07/28/2018 1304   GLUCOSEU NEGATIVE 07/28/2018 1304   HGBUR NEGATIVE 07/28/2018 1304   BILIRUBINUR NEGATIVE 07/28/2018 1304   KETONESUR NEGATIVE 07/28/2018 1304   PROTEINUR NEGATIVE 07/28/2018 1304   UROBILINOGEN 0.2 10/29/2011 1414   NITRITE NEGATIVE 07/28/2018 1304   LEUKOCYTESUR LARGE (A) 07/28/2018 1304    Radiological Exams on  Admission: Dg Chest 2 View  Result Date: 07/28/2018 CLINICAL DATA:  Hemoptysis EXAM: CHEST - 2 VIEW COMPARISON:  04/06/2018 FINDINGS: New mass lesion right middle lobe measuring approximately 3 cm. Dual lead pacemaker. Heart size upper normal. Negative for heart failure. No effusion. Mild hyperinflation of the lungs IMPRESSION:  3 cm right middle lobe mass, new since 04/06/2018. This is concerning for neoplasm however given its rapid development could be an area of infection. Followup PA and lateral chest X-ray is recommended in 3-4 weeks following trial of antibiotic therapy to ensure resolution and exclude underlying malignancy. Electronically Signed   By: Franchot Gallo M.D.   On: 07/28/2018 10:59   Ct Angio Chest Pe W And/or Wo Contrast  Result Date: 07/28/2018 CLINICAL DATA:  hemoptysis. +weakness, cough and decrease in PO intake x 3 weeks EXAM: CT ANGIOGRAPHY CHEST WITH CONTRAST TECHNIQUE: Multidetector CT imaging of the chest was performed using the standard protocol during bolus administration of intravenous contrast. Multiplanar CT image reconstructions and MIPs were obtained to evaluate the vascular anatomy. CONTRAST:  160mL ISOVUE-370 IOPAMIDOL (ISOVUE-370) INJECTION 76% COMPARISON:  02/02/2017 FINDINGS: Cardiovascular: Left subclavian dual lead transvenous pacemaker, leads extending into the right atrium and towards the right ventricular apex. Heart size normal. No pericardial effusion. Satisfactory opacification of pulmonary arteries noted, and there is no evidence of pulmonary emboli. Coronary calcifications. There is good contrast opacification of the thoracic aorta. There is eccentric aneurysmal dilatation of the distal arch up to 3.8 cm diameter, aorta returning to normal caliber of 3 cm in the proximal descending segment. Moderate scattered calcified atheromatous plaque in the arch and descending thoracic segment. There is some eccentric nonocclusive mural thrombus in the visualized proximal  abdominal aorta. Tines of infrarenal stent graft are partially visualized. Classic 3 vessel brachiocephalic arterial origin anatomy without proximal stenosis. Mediastinum/Nodes: No hilar or mediastinal adenopathy. Lungs/Pleura: No pleural effusion. No pneumothorax. Pulmonary emphysema (ICD10-J43.9). 4 cm spiculated mass in the right middle lobe, previously 1 cm. 2 subpleural satellite nodules measuring 6 and 10 mm are relatively stable. Smaller nonspecific nodules in the posterior upper lobes are stable. Upper Abdomen: Cholecystectomy clips. Stable bilateral adrenal fullness. No acute findings. Musculoskeletal: Old healed sternal fracture. No acute fracture or worrisome bone lesion. Review of the MIP images confirms the above findings. IMPRESSION: 1. Negative for acute PE or thoracic aortic dissection. 2. Enlarging 4 cm right middle lobe mass suggesting primary bronchogenic carcinoma. Consider pulmonary/thoracic consultation. 3. Coronary and Aortic Atherosclerosis (ICD10-170.0). 4. 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 Recommend annual imaging followup by CTA or MRA. This recommendation follows 2010 ACCF/AHA/AATS/ACR/ASA/SCA/SCAI/SIR/STS/SVM Guidelines for the Diagnosis and Management of Patients with Thoracic Aortic Disease. Circulation.2010; 121: M426-S341 Electronically Signed   By: Lucrezia Europe M.D.   On: 07/28/2018 17:03    EKG: Independently reviewed.  Assessment/Plan Principal Problem:   Mass of middle lobe of right lung Active Problems:   COPD (chronic obstructive pulmonary disease) ([name])   Hypertension associated with diabetes ([place])   Anticoagulated on Coumadin   Atrial fibrillation, chronic   Type 2 diabetes mellitus with stage 3 chronic kidney disease, without long-term current use of insulin ([name])   Acute lower UTI   Hemoptysis   Oxygen dependent   Acute on chronic respiratory failure with hypoxia ([place])    1. RML lung mass - 1. Call pulm in AM for consult 2. Hemoptysis -  likely secondary to lung mass 1. Holding coumadin 3. Acute on chronic resp failure with hypoxia - Due to lung mass and hemotpysis 1. Increased O2 requirement 2. Cont pulse ox 3. Cont home nebs 4. UTI - 1. Rocephin 2. Culture pending 5. A.Fib - 1. Continue rate control with metoprolol 2. Hold coumadin 6. HTN - continue home BP meds 7. DM2 - 1. Hold home PO  hypoglycemics 2. Mod scale SSI AC  DVT prophylaxis: SCDs Code Status: DNR - confirmed with patient Family Communication: Son at bedside Disposition Plan: Home after admit Consults called: None Admission status: Admit to inpatient  Severity of Illness: The appropriate patient status for this patient is INPATIENT. Inpatient status is judged to be reasonable and necessary in order to provide the required intensity of service to ensure the patient's safety. The patient's presenting symptoms, physical exam findings, and initial radiographic and laboratory data in the context of their chronic comorbidities is felt to place them at high risk for further clinical deterioration. Furthermore, it is not anticipated that the patient will be medically stable for discharge from the hospital within 2 midnights of admission. The following factors support the patient status of inpatient.   " The patient's presenting symptoms include SOB, hemoptysis. " The worrisome physical exam findings include increased O2 requirement. " The initial radiographic and laboratory data are worrisome because of RML lung mass, UTI. " The chronic co-morbidities include COPD, O2 dependent at baseline, A.Fib on coumadin.   * I certify that at the point of admission it is my clinical judgment that the patient will require inpatient hospital care spanning beyond 2 midnights from the point of admission due to high intensity of service, high risk for further deterioration and high frequency of surveillance required.Etta Quill DO [facility] Pager  [phone] Only works nights!  If 7AM-7PM, please contact the primary day team physician taking care of patient  [email or web address] Password [email or web address]  07/28/2018, 8:22 PM

## 2018-07-28 NOTE — Progress Notes (Signed)
   Subjective:  Alejandra [name] is a 85 y.o. female who presents today for same-day appointment with a chief complaint of cough.   HPI:  Cough, Acute problem Started 2 to 3 weeks ago.  Stable over that time.  Cough is productive of blood-tinged sputum.  No fevers or chills.  No rhinorrhea.  No malaise.  No treatments tried.  No other obvious alleviating or aggravating factors.   Anorexia Started several weeks ago.  Patient states she has no appetite.  She is able to take a few bites does not able to eat anymore.  No pain with eating.  No abdominal pain.  No nausea or vomiting.  No constipation.   ROS: Per HPI, otherwise a complete review of systems was negative.   [place]: History is significant for oxygen dependent COPD and smoking history.  She reports that she quit smoking about 8 years ago. Her smoking use included cigarettes. She has a 27.00 pack-year smoking history. She has never used smokeless tobacco. She reports that she does not drink alcohol or use drugs.   Objective:  Physical Exam: BP (!) 104/56 (BP Location: Left Arm, Patient Position: Sitting, Cuff Size: Normal)   Pulse 72   Temp (!) 97.5 F (36.4 C) (Oral)   Ht 5\' 5"  (1.651 m)   Wt 135 lb 3.2 oz (61.3 kg)   BMI 22.50 kg/m   Wt Readings from Last 3 Encounters:  07/28/18 135 lb 3.2 oz (61.3 kg)  04/25/18 144 lb (65.3 kg)  04/08/18 134 lb 12.8 oz (61.1 kg)  Gen: NAD, ill appearing 85 year old woman with nasal cannula in place CV: RRR with no murmurs appreciated Pulm: Minimally increased work of breathing.  Rhonchi noted at bases bilaterally. MSK: No edema, cyanosis, or clubbing noted Skin: Warm, dry Neuro: Grossly normal, moves all extremities Psych: Normal affect and thought content  Assessment/Plan:  Cough/hemoptysis/anorexia/new lung nodule X-ray with concerning finding of new mass in right middle lobe.  Discussed with radiology.  Concern for cancer versus possible pneumonia.  Given her other symptoms in  addition to her soft blood pressures in the office today, recommended further work-up for possible lung cancer.  Discussed the possibility that this could represent lung cancer with patient and her daughter.  They have difficulty with transportation and are not sure if they will be able to get a CT scan done as an outpatient.  She will additionally need blood work done including CBC and INR.  Given her transportation difficulties, I recommended transport to the emergency department for further evaluation.  Patient and daughter voiced understanding.  They left via private vehicle.  Time Spent: I spent >40 minutes face-to-face with the patient, with more than half spent on coordinating care care and counseling for management plan for cough, hemoptysis, anorexia, new lung nodule.  Patient has a HIGH level of medical complexity due to number of diagnoses/treatment options and  Risk of complication.   Algis Greenhouse. [name], MD 07/28/2018 12:34 PM

## 2018-07-28 NOTE — ED Notes (Signed)
Pt sitting on edge of bed.

## 2018-07-28 NOTE — ED Provider Notes (Signed)
[facility] DEPT Provider Note   CSN: [id number] Arrival date & time: 07/28/18  1141     History   Chief Complaint Chief Complaint  Patient presents with  . Hemoptysis    HPI Alejandra [name] is a 85 y.o. female.  Patient is an 85 year old female with a history of CKD, hypertension, diabetes, AAA, prior PE on Coumadin who is presenting today with her family member from her PCP office for further evaluation of hemoptysis, weight loss, generalized weakness and poor appetite.  Patient states that the cough started 2 to 3 weeks ago and is intermittently productive of blood in sputum.  She denies any fever or chills but states she has had no appetite for 2 weeks and has had weight loss of 9 pounds since October.  She is not had any nausea, vomiting or diarrhea.  She does state occasionally it is hard to get her urine going but denies any dysuria or burning.  At her doctor's office today her chest x-ray was abnormal and they sent her here for further care.  Last Coumadin level was checked last month and it was okay.  Patient wears 2 L of oxygen chronically but states she has been more short of breath with exertion.  She denies any vomiting blood but states her stools do look dark.  No chest pain or abdominal pain.  The history is provided by the patient and a relative.    Past Medical History:  Diagnosis Date  . AAA (abdominal aortic aneurysm) ([place])   . Arthritis   . COPD (chronic obstructive pulmonary disease) ([place])   . DM (diabetes mellitus) ([place])   . Dyslipidemia   . Hyperlipidemia   . Hypertension   . Left foot infection 2013  . 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 2013  . Pulmonary embolism [facility])     Patient Active Problem List   Diagnosis Date Noted  . Psoriasis 01/04/2018  . Osteoarthritis 11/03/2017  . Type 2 diabetes mellitus with stage 3 chronic kidney disease, without long-term current use of insulin ([place]) 09/28/2017  . Syncope 02/02/2017  . Atrial  fibrillation, chronic   . Anticoagulated on Coumadin 10/09/2016  . Neck pain 12/11/2015  . CKD stage 3 secondary to diabetes ([place]) 07/08/2015  . Swelling of limb-Right  Leg 11/30/2013  . Aneurysm of abdominal vessel ([place]) 11/29/2011  . Hypertension associated with diabetes ([place]) 10/29/2011  . Hyperlipidemia associated with type 2 diabetes mellitus ([place]) 10/29/2011  . UNSPECIFIED VITAMIN D DEFICIENCY 09/21/2010  . COPD with acute bronchitis ([place]) 02/13/2010  . PULMONARY NODULE 02/13/2010    Past Surgical History:  Procedure Laterality Date  . ABDOMINAL AORTIC ANEURYSM REPAIR  10-30-2011  . CHOLECYSTECTOMY N/A 10/14/2016   Procedure: LAPAROSCOPIC CHOLECYSTECTOMY;  Surgeon: Clovis Riley, MD;  Location: [facility];  Service: General;  Laterality: N/A;  . ENDOVASCULAR STENT INSERTION  10/30/2011   Procedure: ENDOVASCULAR STENT GRAFT INSERTION;  Surgeon: Serafina Mitchell, MD;  Location: [facility];  Service: Vascular;  Laterality: N/A;  . PACEMAKER IMPLANT N/A 02/04/2017   Procedure: Pacemaker Implant;  Surgeon: Constance Haw, MD;  Location: [facility];  Service: Cardiovascular;  Laterality: N/A;  . VESICOVAGINAL FISTULA CLOSURE W/ TAH       OB History   No obstetric history on file.      Home Medications    Prior to Admission medications   Medication Sig Start Date End Date Taking? Authorizing Provider  acetaminophen (TYLENOL) 500 MG tablet Take 1,000 mg  by mouth 2 (two) times daily as needed for mild pain.   Yes [provider]  adapalene (DIFFERIN) 0.1 % gel APPLY TO SCALP PSORIASIS ONCE DAILY Patient taking differently: Apply 1 application topically daily.  05/25/18  Yes Vivi Barrack, MD  albuterol (PROVENTIL HFA;VENTOLIN HFA) 108 (90 Base) MCG/ACT inhaler Inhale 2 puffs into the lungs every 6 (six) hours as needed for wheezing or shortness of breath. 11/23/17  Yes Vivi Barrack, MD  aspirin 81 MG tablet Take 81 mg by mouth daily.     Yes [provider]    budesonide-formoterol (SYMBICORT) 80-4.5 MCG/ACT inhaler Inhale 2 puffs into the lungs 2 (two) times daily. 11/23/17  Yes Vivi Barrack, MD  Cholecalciferol (VITAMIN D3) 1000 UNITS CAPS Take 1 capsule by mouth daily.     Yes [provider]  dextromethorphan-guaiFENesin (MUCINEX DM) 30-600 MG 12hr tablet Take 1 tablet by mouth 2 (two) times daily. 04/08/18  Yes Barton Dubois, MD  gabapentin (NEURONTIN) 100 MG capsule Start with 1 tab po qhs X 1 week, then increase to 1 tab po bid X 1 week then 1 tab po tid prn Patient taking differently: Take 100 mg by mouth 3 (three) times daily as needed (for pain).  11/15/17  Yes Gerda Diss, DO  Garlic 3785 MG CAPS Take 1,000 mg by mouth daily.    Yes [provider]  glipiZIDE (GLUCOTROL) 5 MG tablet TAKE 1 TABLET(5 MG) BY MOUTH TWICE DAILY BEFORE A MEAL Patient taking differently: Take 5 mg by mouth 2 (two) times daily before a meal.  07/25/18  Yes Vivi Barrack, MD  losartan (COZAAR) 25 MG tablet Take 1 tablet (25 mg total) by mouth daily. 04/28/18  Yes Vivi Barrack, MD  lovastatin (MEVACOR) 20 MG tablet Take 1 tablet (20 mg total) by mouth at bedtime. 09/28/17  Yes Vivi Barrack, MD  metFORMIN (GLUCOPHAGE) 500 MG tablet TAKE 1 TABLET(500 MG) BY MOUTH TWICE DAILY WITH A MEAL Patient taking differently: Take 500 mg by mouth 2 (two) times daily with a meal.  07/25/18  Yes Vivi Barrack, MD  Multiple Vitamin (MULTIVITAMIN) tablet Take 1 tablet by mouth daily.     Yes [provider]  omega-3 acid ethyl esters (LOVAZA) 1 G capsule Take 2 g by mouth 2 (two) times daily.   Yes [provider]  pantoprazole (PROTONIX) 40 MG tablet Take 1 tablet (40 mg total) by mouth daily. 04/09/18  Yes Barton Dubois, MD  Tazarotene 0.1 % FOAM Apply to scalp psoriasis 1 time daily. Patient taking differently: Apply topically daily. Apply to scalp psoriasis 01/04/18  Yes Vivi Barrack, MD  Tiotropium Bromide Monohydrate (SPIRIVA  RESPIMAT) 2.5 MCG/ACT AERS Inhale 2 puffs into the lungs daily. 04/05/18  Yes Vivi Barrack, MD  triamcinolone ointment (KENALOG) 0.5 % APPLY TOPICALLY TO THE AFFECTED AREA TWICE DAILY Patient taking differently: Apply 1 application topically 2 (two) times daily.  05/25/18  Yes Vivi Barrack, MD  triamterene-hydrochlorothiazide (DYAZIDE) 37.5-25 MG capsule Take 1 each (1 capsule total) by mouth daily. 02/16/18  Yes Vivi Barrack, MD  warfarin (COUMADIN) 5 MG tablet Take 1-1.5 tablets (5-7.5 mg total) by mouth See admin instructions. Take 1 and 1/2 tablets on Monday then take 1 tablet the other days Patient taking differently: Take 5-7.5 mg by mouth See admin instructions. Take 7.5 mg by mouth on Monday then take 5 mg by mouth on the other days 05/01/18  Yes  Vivi Barrack, MD  acetaminophen-codeine (TYLENOL #3) 300-30 MG tablet TAKE 1 TABLET BY MOUTH EVERY 8 HOURS AS NEEDED FOR SEVERE PAIN Patient not taking: No sig reported 01/02/18   Vivi Barrack, MD  metoprolol tartrate (LOPRESSOR) 100 MG tablet Take 1 tablet (100 mg total) by mouth 2 (two) times daily. 02/08/18 05/09/18  Camnitz, Ocie Doyne, MD  ondansetron (ZOFRAN ODT) 4 MG disintegrating tablet Take 1 tablet (4 mg total) by mouth every 8 (eight) hours as needed for nausea or vomiting. Patient not taking: Reported on 07/28/2018 04/05/18   Vivi Barrack, MD    Family History Family History  Problem Relation Age of Onset  . Heart disease Mother   . Hypertension Mother   . Heart attack Mother   . Hypertension Father   . Diabetes Father   . Heart attack Father   . Heart disease Sister        x 3  . Hypertension Sister   . Heart attack Sister   . Clotting disorder Daughter   . Clotting disorder Son        multiple sons  . Diabetes Son   . Hypertension Brother     Social History Social History   Tobacco Use  . Smoking status: Former Smoker    Packs/day: 0.50    Years: 54.00    Pack years: 27.00    Types: Cigarettes     Last attempt to quit: 01/23/2010    Years since quitting: 8.5  . Smokeless tobacco: Never Used  Substance Use Topics  . Alcohol use: No    Alcohol/week: 0.0 standard drinks  . Drug use: No     Allergies   Penicillins   Review of Systems Review of Systems  All other systems reviewed and are negative.    Physical Exam Updated Vital Signs BP 126/61 (BP Location: Right Arm)   Pulse 81   Temp 97.6 F (36.4 C)   Resp (!) 24   SpO2 98%   Physical Exam Vitals signs and nursing note reviewed.  Constitutional:      General: She is not in acute distress.    Appearance: She is well-developed and normal weight.  HENT:     Head: Normocephalic and atraumatic.  Eyes:     Pupils: Pupils are equal, round, and reactive to light.     Comments: Pale conjunctive a  Neck:     Musculoskeletal: Normal range of motion and neck supple.  Cardiovascular:     Rate and Rhythm: Normal rate and regular rhythm.     Heart sounds: No murmur.  Pulmonary:     Effort: Pulmonary effort is normal. Tachypnea present. No respiratory distress.     Breath sounds: Examination of the right-lower field reveals rales. Examination of the left-lower field reveals rales. Rales present. No wheezing.  Abdominal:     General: There is no distension.     Palpations: Abdomen is soft.     Tenderness: There is no abdominal tenderness. There is no guarding or rebound.  Musculoskeletal: Normal range of motion.        General: No tenderness.  Skin:    General: Skin is warm and dry.     Capillary Refill: Capillary refill takes 2 to 3 seconds.     Coloration: Skin is pale.     Findings: No erythema or rash.  Neurological:     Mental Status: She is alert and oriented to person, place, and time. Mental status is at baseline.  Psychiatric:        Mood and Affect: Mood normal.        Behavior: Behavior normal.      ED Treatments / Results  Labs (all labs ordered are listed, but only abnormal results are  displayed) Labs Reviewed  URINE CULTURE - Abnormal; Notable for the following components:      Result Value   Culture   (*)    Value: 20,000 COLONIES/mL MULTIPLE SPECIES PRESENT, SUGGEST RECOLLECTION   All other components within normal limits  CBC WITH DIFFERENTIAL/PLATELET - Abnormal; Notable for the following components:   WBC 12.6 (*)    Hemoglobin 10.3 (*)    HCT 31.4 (*)    MCV 76.6 (*)    MCH 25.1 (*)    Neutro Abs 10.2 (*)    All other components within normal limits  COMPREHENSIVE METABOLIC PANEL - Abnormal; Notable for the following components:   Chloride 95 (*)    BUN 35 (*)    Creatinine, Ser 1.33 (*)    Total Protein 8.4 (*)    Albumin 3.2 (*)    Alkaline Phosphatase 132 (*)    GFR calc non Af Amer 37 (*)    GFR calc Af Amer 42 (*)    All other components within normal limits  BRAIN NATRIURETIC PEPTIDE - Abnormal; Notable for the following components:   B Natriuretic Peptide 108.2 (*)    All other components within normal limits  URINALYSIS, ROUTINE W REFLEX MICROSCOPIC - Abnormal; Notable for the following components:   APPearance HAZY (*)    Leukocytes, UA LARGE (*)    Bacteria, UA FEW (*)    All other components within normal limits  PROTIME-INR - Abnormal; Notable for the following components:   Prothrombin Time 38.1 (*)    All other components within normal limits  GLUCOSE, CAPILLARY - Abnormal; Notable for the following components:   Glucose-Capillary 192 (*)    All other components within normal limits  CBC WITH DIFFERENTIAL/PLATELET - Abnormal; Notable for the following components:   WBC 14.3 (*)    RBC 3.45 (*)    Hemoglobin 8.8 (*)    HCT 26.1 (*)    MCV 75.7 (*)    MCH 25.5 (*)    Neutro Abs 11.6 (*)    Monocytes Absolute 1.5 (*)    Abs Immature Granulocytes 0.11 (*)    All other components within normal limits  COMPREHENSIVE METABOLIC PANEL - Abnormal; Notable for the following components:   Chloride 95 (*)    Glucose, Bld 177 (*)    BUN  40 (*)    Creatinine, Ser 1.39 (*)    Albumin 2.6 (*)    Alkaline Phosphatase 131 (*)    GFR calc non Af Amer 35 (*)    GFR calc Af Amer 40 (*)    All other components within normal limits  PROTIME-INR - Abnormal; Notable for the following components:   Prothrombin Time 37.7 (*)    All other components within normal limits  GLUCOSE, CAPILLARY - Abnormal; Notable for the following components:   Glucose-Capillary 282 (*)    All other components within normal limits  GLUCOSE, CAPILLARY - Abnormal; Notable for the following components:   Glucose-Capillary 184 (*)    All other components within normal limits  CBC WITH DIFFERENTIAL/PLATELET - Abnormal; Notable for the following components:   WBC 12.3 (*)    RBC 3.56 (*)    Hemoglobin 8.8 (*)    HCT 26.4 (*)  MCV 74.2 (*)    MCH 24.7 (*)    Platelets 402 (*)    Neutro Abs 9.7 (*)    Monocytes Absolute 1.2 (*)    All other components within normal limits  BASIC METABOLIC PANEL - Abnormal; Notable for the following components:   Sodium 133 (*)    Chloride 91 (*)    Glucose, Bld 198 (*)    BUN 50 (*)    Creatinine, Ser 1.62 (*)    GFR calc non Af Amer 29 (*)    GFR calc Af Amer 33 (*)    All other components within normal limits  PROTIME-INR - Abnormal; Notable for the following components:   Prothrombin Time 27.9 (*)    All other components within normal limits  GLUCOSE, CAPILLARY - Abnormal; Notable for the following components:   Glucose-Capillary 173 (*)    All other components within normal limits  GLUCOSE, CAPILLARY - Abnormal; Notable for the following components:   Glucose-Capillary 276 (*)    All other components within normal limits  GLUCOSE, CAPILLARY - Abnormal; Notable for the following components:   Glucose-Capillary 184 (*)    All other components within normal limits  PROTIME-INR - Abnormal; Notable for the following components:   Prothrombin Time 23.8 (*)    All other components within normal limits  CBC  WITH DIFFERENTIAL/PLATELET - Abnormal; Notable for the following components:   WBC 12.2 (*)    RBC 3.16 (*)    Hemoglobin 7.9 (*)    HCT 23.7 (*)    MCV 75.0 (*)    MCH 25.0 (*)    Neutro Abs 9.7 (*)    Monocytes Absolute 1.4 (*)    All other components within normal limits  BASIC METABOLIC PANEL - Abnormal; Notable for the following components:   Glucose, Bld 175 (*)    BUN 36 (*)    Creatinine, Ser 1.27 (*)    Calcium 8.3 (*)    GFR calc non Af Amer 39 (*)    GFR calc Af Amer 45 (*)    All other components within normal limits  GLUCOSE, CAPILLARY - Abnormal; Notable for the following components:   Glucose-Capillary 157 (*)    All other components within normal limits  GLUCOSE, CAPILLARY - Abnormal; Notable for the following components:   Glucose-Capillary 163 (*)    All other components within normal limits  GLUCOSE, CAPILLARY - Abnormal; Notable for the following components:   Glucose-Capillary 201 (*)    All other components within normal limits  GLUCOSE, CAPILLARY - Abnormal; Notable for the following components:   Glucose-Capillary 259 (*)    All other components within normal limits  GLUCOSE, CAPILLARY - Abnormal; Notable for the following components:   Glucose-Capillary 198 (*)    All other components within normal limits  GLUCOSE, CAPILLARY - Abnormal; Notable for the following components:   Glucose-Capillary 188 (*)    All other components within normal limits  MAGNESIUM  GLUCOSE, CAPILLARY  PROTIME-INR  CBC  BASIC METABOLIC PANEL  I-STAT TROPONIN, ED    EKG EKG Interpretation  Date/Time:  Friday July 28 2018 13:57:25 EST Ventricular Rate:  92 PR Interval:    QRS Duration: 153 QT Interval:  401 QTC Calculation: 448 R Axis:   -48 Text Interpretation:  Atrial fibrillation Consider left atrial enlargement RBBB and LAFB Probable left ventricular hypertrophy No significant change since last tracing Confirmed by Blanchie Dessert [id number]) on 07/28/2018  2:08:08 PM Also confirmed by Blanchie Dessert [phone]),  editor Philomena Doheny [phone])  on 07/29/2018 2:28:24 PM   Radiology Dg Chest 2 View  Result Date: 07/28/2018 CLINICAL DATA:  Hemoptysis EXAM: CHEST - 2 VIEW COMPARISON:  04/06/2018 FINDINGS: New mass lesion right middle lobe measuring approximately 3 cm. Dual lead pacemaker. Heart size upper normal. Negative for heart failure. No effusion. Mild hyperinflation of the lungs IMPRESSION: 3 cm right middle lobe mass, new since 04/06/2018. This is concerning for neoplasm however given its rapid development could be an area of infection. Followup PA and lateral chest X-ray is recommended in 3-4 weeks following trial of antibiotic therapy to ensure resolution and exclude underlying malignancy. Electronically Signed   By: Franchot Gallo M.D.   On: 07/28/2018 10:59    Procedures Procedures (including critical care time)  Medications Ordered in ED Medications - No data to display   Initial Impression / Assessment and Plan / ED Course  I have reviewed the triage vital signs and the nursing notes.  Pertinent labs & imaging results that were available during my care of the patient were reviewed by me and considered in my medical decision making (see chart for details).     Elderly female presenting with 2 to 3 weeks of hemoptysis, weakness that is generalized and no appetite.  Patient seen by PCP today and sent here for chest x-ray findings that showed a 3 cm right middle lobe mass new from September 2019 which is concerning for neoplasm versus infection.  Patient denies any fever but symptoms have started more suddenly and concern for infection.  She is a non-smoker but does wear 2 L of oxygen at home.  No prior history of CHF and no evidence of fluid overload today.  CBC, CMP, INR, chest CT pending.  Lower suspicion for PE at this time as patient is anticoagulated.  She has no chest pain or abdominal pain concerning for dissection or ACS.  Patient does  appear pale and concern for anemia.  Pt checked out t o Dr. Gustavus Messing  Final Clinical Impressions(s) / ED Diagnoses   Final diagnoses:  None    ED Discharge Orders    None       Blanchie Dessert, MD 08/01/18 [phone]

## 2018-07-29 DIAGNOSIS — R918 Other nonspecific abnormal finding of lung field: Secondary | ICD-10-CM

## 2018-07-29 LAB — CBC WITH DIFFERENTIAL/PLATELET
ABS IMMATURE GRANULOCYTES: 0.11 10*3/uL — AB (ref 0.00–0.07)
Basophils Absolute: 0 10*3/uL (ref 0.0–0.1)
Basophils Relative: 0 %
Eosinophils Absolute: 0.1 10*3/uL (ref 0.0–0.5)
Eosinophils Relative: 1 %
HCT: 26.1 % — ABNORMAL LOW (ref 36.0–46.0)
Hemoglobin: 8.8 g/dL — ABNORMAL LOW (ref 12.0–15.0)
Immature Granulocytes: 1 %
Lymphocytes Relative: 7 %
Lymphs Abs: 1 10*3/uL (ref 0.7–4.0)
MCH: 25.5 pg — ABNORMAL LOW (ref 26.0–34.0)
MCHC: 33.7 g/dL (ref 30.0–36.0)
MCV: 75.7 fL — ABNORMAL LOW (ref 80.0–100.0)
Monocytes Absolute: 1.5 10*3/uL — ABNORMAL HIGH (ref 0.1–1.0)
Monocytes Relative: 10 %
NEUTROS ABS: 11.6 10*3/uL — AB (ref 1.7–7.7)
Neutrophils Relative %: 81 %
Platelets: 355 10*3/uL (ref 150–400)
RBC: 3.45 MIL/uL — ABNORMAL LOW (ref 3.87–5.11)
RDW: 14.6 % (ref 11.5–15.5)
WBC: 14.3 10*3/uL — ABNORMAL HIGH (ref 4.0–10.5)
nRBC: 0 % (ref 0.0–0.2)

## 2018-07-29 LAB — GLUCOSE, CAPILLARY
GLUCOSE-CAPILLARY: 78 mg/dL (ref 70–99)
Glucose-Capillary: 192 mg/dL — ABNORMAL HIGH (ref 70–99)
Glucose-Capillary: 282 mg/dL — ABNORMAL HIGH (ref 70–99)
Glucose-Capillary: 184 mg/dL — ABNORMAL HIGH (ref 70–99)

## 2018-07-29 LAB — COMPREHENSIVE METABOLIC PANEL
ALT: 31 U/L (ref 0–44)
AST: 27 U/L (ref 15–41)
Albumin: 2.6 g/dL — ABNORMAL LOW (ref 3.5–5.0)
Alkaline Phosphatase: 131 U/L — ABNORMAL HIGH (ref 38–126)
Anion gap: 12 (ref 5–15)
BUN: 40 mg/dL — AB (ref 8–23)
CO2: 30 mmol/L (ref 22–32)
Calcium: 8.9 mg/dL (ref 8.9–10.3)
Chloride: 95 mmol/L — ABNORMAL LOW (ref 98–111)
Creatinine, Ser: 1.39 mg/dL — ABNORMAL HIGH (ref 0.44–1.00)
GFR calc Af Amer: 40 mL/min — ABNORMAL LOW (ref >60)
GFR calc non Af Amer: 35 mL/min — ABNORMAL LOW (ref >60)
Glucose, Bld: 177 mg/dL — ABNORMAL HIGH (ref 70–99)
Potassium: 4.1 mmol/L (ref 3.5–5.1)
Sodium: 137 mmol/L (ref 135–145)
Total Bilirubin: 0.5 mg/dL (ref 0.3–1.2)
Total Protein: 7.5 g/dL (ref 6.5–8.1)

## 2018-07-29 LAB — PROTIME-INR
INR: 3.9
Prothrombin Time: 37.7 seconds — ABNORMAL HIGH (ref 11.4–15.2)

## 2018-07-29 LAB — MAGNESIUM: Magnesium: 2.1 mg/dL (ref 1.7–2.4)

## 2018-07-29 MED ORDER — IPRATROPIUM-ALBUTEROL 0.5-2.5 (3) MG/3ML IN SOLN
3.0000 mL | Freq: Three times a day (TID) | RESPIRATORY_TRACT | Status: DC
  Administered 2018-07-29 – 2018-07-30 (×3): 3 mL via RESPIRATORY_TRACT
  Filled 2018-07-29 (×3): qty 3

## 2018-07-29 MED ORDER — IPRATROPIUM-ALBUTEROL 0.5-2.5 (3) MG/3ML IN SOLN: 3.0000 mL | RESPIRATORY_TRACT | Status: DC | PRN

## 2018-07-29 MED ORDER — HYDROCODONE-HOMATROPINE 5-1.5 MG/5ML PO SYRP
5.0000 mL | ORAL_SOLUTION | Freq: Four times a day (QID) | ORAL | Status: DC | PRN
  Administered 2018-07-29 – 2018-08-03 (×2): 5 mL via ORAL
  Filled 2018-07-29 (×2): qty 5

## 2018-07-29 MED ORDER — BUDESONIDE 0.5 MG/2ML IN SUSP
0.5000 mg | Freq: Two times a day (BID) | RESPIRATORY_TRACT | Status: DC
  Administered 2018-07-29 – 2018-08-03 (×10): 0.5 mg via RESPIRATORY_TRACT
  Filled 2018-07-29 (×10): qty 2

## 2018-07-29 NOTE — Consult Note (Signed)
NAME:  Alejandra [name], MRN:  [id number], DOB:  08-13-1931, LOS: 1 ADMISSION DATE:  07/28/2018, CONSULTATION DATE:  07/29/18 REFERRING MD:  Cline Cools MD, CHIEF COMPLAINT:  Lung mass, hemoptysis  Brief History   85 year old with history of COPD, emphysema on 2 lt home O2, ex-smoker presenting with several weeks of intermittent hemoptysis, red mucus. She had a chest x-ray at primary care which showed right lung mass and has been sent to the ED for further evaluation  Past Medical History  CKD, hypertension, diabetes, AAA, COPD, emphysema She has been on Coumadin for unprovoked PE in July 2011. Was former pt of Dr. 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  I have reviewed the images personally.  Micro Data:    Antimicrobials:    Interim history/subjective:    Objective   Blood pressure 117/69, pulse 89, temperature 99.4 F (37.4 C), temperature source Oral, resp. rate (!) 23, SpO2 100 %.        Intake/Output Summary (Last 24 hours) at 07/29/2018 1026 Last data filed at 07/29/2018 0600 Gross per 24 hour  Intake 697.68 ml  Output -  Net 697.68 ml   There were no vitals filed for this visit.  Examination: Gen:      Elderly woman HEENT:  EOMI, sclera anicteric Neck:     No masses; no thyromegaly Lungs:    Clear to auscultation bilaterally; normal respiratory effort CV:         Regular rate and rhythm; no murmurs Abd:      + bowel sounds; soft, non-tender; no palpable masses, no distension Ext:    No edema; adequate peripheral perfusion Skin:      Warm and dry; no rash Neuro: Awake, oriented.  Mildly confused.  Resolved Hospital Problem list    Assessment & Plan:  Right middle lobe mass, hemoptysis The right middle lobe mass  was seen as a small nodule in 2018.  The appearance and growth is highly suspicious for malignancy. To make diagnosis she will need bronchoscopy with navigational biopsy of the mass Reviewed the risk benefits of the procedure with the patient and her son and they want to discuss among themselves further before proceeding. We will follow back next week on Monday to meet with the family again and plan further steps  History of remote PE on Coumadin Will need to hold anticoagulation for procedure. No need to reverse or bridge coumadin. Let INR drift down  Labs   CBC: Recent Labs  Lab 07/28/18 1503  WBC 12.6*  NEUTROABS 10.2*  HGB 10.3*  HCT 31.4*  MCV 76.6*  PLT 419    Basic Metabolic Panel: Recent Labs  Lab 07/28/18 1503  NA 138  K 4.8  CL 95*  CO2 29  GLUCOSE 83  BUN 35*  CREATININE 1.33*  CALCIUM 8.9   GFR: Estimated Creatinine Clearance: 28.3 mL/min (A) (by C-G formula based on SCr of 1.33 mg/dL (H)). Recent Labs  Lab 07/28/18 1503  WBC 12.6*    Liver Function Tests: Recent Labs  Lab 07/28/18 1503  AST 35  ALT 33  ALKPHOS 132*  BILITOT 0.6  PROT 8.4*  ALBUMIN 3.2*   No results for input(s): LIPASE, AMYLASE in the last 168 hours. No results for input(s):  AMMONIA in the last 168 hours.  ABG    Component Value Date/Time   TCO2 28 02/02/2017 1843     Coagulation Profile: Recent Labs  Lab 07/28/18 1513  INR 3.97    Cardiac Enzymes: No results for input(s): CKTOTAL, CKMB, CKMBINDEX, TROPONINI in the last 168 hours.  HbA1C: Hgb A1c MFr Bld  Date/Time Value Ref Range Status  04/05/2018 11:00 AM 6.5 4.6 - 6.5 % Final    Comment:    Glycemic Control Guidelines for People with Diabetes:Non Diabetic:  <6%Goal of Therapy: <7%Additional Action Suggested:  >8%   09/28/2017 11:29 AM 6.1 4.6 - 6.5 % Final    Comment:    Glycemic Control Guidelines for People with Diabetes:Non Diabetic:  <6%Goal of Therapy: <7%Additional Action Suggested:  >8%      CBG: Recent Labs  Lab 07/29/18 0754  GLUCAP 192*    Review of Systems:   All negative; except for those that are bolded, which indicate positives.  Constitutional: weight loss, weight gain, night sweats, fevers, chills, fatigue, weakness.  HEENT: headaches, sore throat, sneezing, nasal congestion, post nasal drip, difficulty swallowing, tooth/dental problems, visual complaints, visual changes, ear aches. Neuro: difficulty with speech, weakness, numbness, ataxia. CV:  chest pain, orthopnea, PND, swelling in lower extremities, dizziness, palpitations, syncope.  Resp: cough, hemoptysis, dyspnea, wheezing. GI: heartburn, indigestion, abdominal pain, nausea, vomiting, diarrhea, constipation, change in bowel habits, loss of appetite, hematemesis, melena, hematochezia.  GU: dysuria, change in color of urine, urgency or frequency, flank pain, hematuria. MSK: joint pain or swelling, decreased range of motion. Psych: change in mood or affect, depression, anxiety, suicidal ideations, homicidal ideations. Skin: rash, itching, bruising.  Past Medical History  She,  has a past medical history of AAA (abdominal aortic aneurysm) ([place]), Arthritis, COPD (chronic obstructive pulmonary disease) ([place]), DM (diabetes mellitus) ([place]), Dyslipidemia, Hyperlipidemia, Hypertension, Left foot infection (2013), O2 dependent (Nov. 2013), and Pulmonary embolism ([place]).   Surgical History    Past Surgical History:  Procedure Laterality Date  . ABDOMINAL AORTIC ANEURYSM REPAIR  10-30-2011  . CHOLECYSTECTOMY N/A 10/14/2016   Procedure: LAPAROSCOPIC CHOLECYSTECTOMY;  Surgeon: Clovis Riley, MD;  Location: [place];  Service: General;  Laterality: N/A;  . ENDOVASCULAR STENT INSERTION  10/30/2011   Procedure: ENDOVASCULAR STENT GRAFT INSERTION;  Surgeon: Serafina Mitchell, MD;  Location: [place];  Service: Vascular;  Laterality: N/A;  . PACEMAKER IMPLANT N/A 02/04/2017   Procedure: Pacemaker Implant;  Surgeon: Constance Haw, MD;  Location: [place] [facility];  Service: Cardiovascular;  Laterality: N/A;  . VESICOVAGINAL FISTULA CLOSURE W/ TAH       Social History   reports that she quit smoking about 8 years ago. Her smoking use included cigarettes. She has a 27.00 pack-year smoking history. She has never used smokeless tobacco. She reports that she does not drink alcohol or use drugs.   Family History   Her family history includes Clotting disorder in her daughter and son; Diabetes in her father and son; Heart attack in her father, mother, and sister; Heart disease in her mother and sister; Hypertension in her brother, father, mother, and sister.   Allergies Allergies  Allergen Reactions  . Penicillins Nausea And Vomiting, Rash and Other (See Comments)    Childhood reaction, tolerated multiple cephs in 2013. Has patient had a PCN reaction causing immediate rash, facial/tongue/throat swelling, SOB or lightheadedness with hypotension: Yes Has patient had a PCN reaction causing severe rash involving mucus membranes or skin necrosis: Yes Has  patient had a PCN reaction that required hospitalization: Yes Has patient had a PCN reaction occurring within the last 10 years: No      Home Medications  Prior to Admission medications   Medication Sig Start Date End Date Taking? Authorizing Provider  acetaminophen (TYLENOL) 500 MG tablet Take 1,000 mg by mouth 2 (two) times daily as needed for mild pain.   Yes [provider]  adapalene (DIFFERIN) 0.1 % gel APPLY TO SCALP PSORIASIS ONCE DAILY Patient taking differently: Apply 1 application topically daily.  05/25/18  Yes Vivi Barrack, MD  albuterol (PROVENTIL HFA;VENTOLIN HFA) 108 (90 Base) MCG/ACT inhaler Inhale 2 puffs into the lungs every 6 (six) hours as needed for wheezing or shortness of breath. 11/23/17  Yes Vivi Barrack, MD  aspirin 81 MG tablet Take 81 mg by mouth daily.     Yes [provider]  budesonide-formoterol (SYMBICORT)  80-4.5 MCG/ACT inhaler Inhale 2 puffs into the lungs 2 (two) times daily. 11/23/17  Yes Vivi Barrack, MD  Cholecalciferol (VITAMIN D3) 1000 UNITS CAPS Take 1 capsule by mouth daily.     Yes [provider]  dextromethorphan-guaiFENesin (MUCINEX DM) 30-600 MG 12hr tablet Take 1 tablet by mouth 2 (two) times daily. 04/08/18  Yes Barton Dubois, MD  gabapentin (NEURONTIN) 100 MG capsule Start with 1 tab po qhs X 1 week, then increase to 1 tab po bid X 1 week then 1 tab po tid prn Patient taking differently: Take 100 mg by mouth 3 (three) times daily as needed (for pain).  11/15/17  Yes Gerda Diss, DO  Garlic 8416 MG CAPS Take 1,000 mg by mouth daily.    Yes [provider]  glipiZIDE (GLUCOTROL) 5 MG tablet TAKE 1 TABLET(5 MG) BY MOUTH TWICE DAILY BEFORE A MEAL Patient taking differently: Take 5 mg by mouth 2 (two) times daily before a meal.  07/25/18  Yes Vivi Barrack, MD  losartan (COZAAR) 25 MG tablet Take 1 tablet (25 mg total) by mouth daily. 04/28/18  Yes Vivi Barrack, MD  lovastatin (MEVACOR) 20 MG tablet Take 1 tablet (20 mg total) by mouth at bedtime. 09/28/17  Yes Vivi Barrack, MD  metFORMIN (GLUCOPHAGE) 500 MG tablet TAKE 1 TABLET(500 MG) BY MOUTH TWICE DAILY WITH A MEAL Patient taking differently: Take 500 mg by mouth 2 (two) times daily with a meal.  07/25/18  Yes Vivi Barrack, MD  Multiple Vitamin (MULTIVITAMIN) tablet Take 1 tablet by mouth daily.     Yes [provider]  omega-3 acid ethyl esters (LOVAZA) 1 G capsule Take 2 g by mouth 2 (two) times daily.   Yes [provider]  pantoprazole (PROTONIX) 40 MG tablet Take 1 tablet (40 mg total) by mouth daily. 04/09/18  Yes Barton Dubois, MD  Tazarotene 0.1 % FOAM Apply to scalp psoriasis 1 time daily. Patient taking differently: Apply topically daily. Apply to scalp psoriasis 01/04/18  Yes Vivi Barrack, MD  Tiotropium Bromide Monohydrate (SPIRIVA RESPIMAT) 2.5 MCG/ACT AERS Inhale 2 puffs  into the lungs daily. 04/05/18  Yes Vivi Barrack, MD  triamcinolone ointment (KENALOG) 0.5 % APPLY TOPICALLY TO THE AFFECTED AREA TWICE DAILY Patient taking differently: Apply 1 application topically 2 (two) times daily.  05/25/18  Yes Vivi Barrack, MD  triamterene-hydrochlorothiazide (DYAZIDE) 37.5-25 MG capsule Take 1 each (1 capsule total) by mouth daily. 02/16/18  Yes Vivi Barrack, MD  warfarin (COUMADIN) 5 MG tablet Take 1-1.5 tablets (  5-7.5 mg total) by mouth See admin instructions. Take 1 and 1/2 tablets on Monday then take 1 tablet the other days Patient taking differently: Take 5-7.5 mg by mouth See admin instructions. Take 7.5 mg by mouth on Monday then take 5 mg by mouth on the other days 05/01/18  Yes Vivi Barrack, MD  metoprolol tartrate (LOPRESSOR) 100 MG tablet Take 1 tablet (100 mg total) by mouth 2 (two) times daily. 02/08/18 05/09/18  Constance Haw, MD    Marshell Garfinkel MD [facility] 07/29/2018, 3:22 PM

## 2018-07-29 NOTE — Progress Notes (Signed)
Returned pt's dtr Mateo Flow) call at [phone] updating her about her mother. All information given was information dtr already knew of when she brought her mother in. dtr expressed thanks.

## 2018-07-29 NOTE — Progress Notes (Signed)
PROGRESS NOTE    Alejandra [name]  BTD:[id number] DOB: Feb 16, 1932 DOA: 07/28/2018 PCP: Vivi Barrack, MD   Brief Narrative:  Per Dr. Soyla Dryer Alejandra [name] is a 85 y.o. female with medical history significant of COPD on 1-2L O2 at baseline, DM2, HTN, prior PE.  Patient presents to the ED with 2-3 week history of hemoptysis.  Cough perisistent, hemoptysis intermittent.  Wt loss, generalized weakness, and poor appetite.  No fever nor chills.  No N/V/D.  PCPs office CXR showed lung mass, sent in for further work up.   ED Course: CT scan shows 4cm RML lung mass, likely primary bronchogenic carcinoma.  UA shows UTI.   Assessment & Plan:   Principal Problem:   Mass of middle lobe of right lung Active Problems:   COPD (chronic obstructive pulmonary disease) ([name])   Hypertension associated with diabetes ([place])   Anticoagulated on Coumadin   Atrial fibrillation, chronic   Type 2 diabetes mellitus with stage 3 chronic kidney disease, without long-term current use of insulin ([name])   Acute lower UTI   Hemoptysis   Oxygen dependent   Acute on chronic respiratory failure with hypoxia ([place])  1 right middle lobe lung mass Noted on chest x-ray done at PCPs office.  CT angiogram chest concerning for enlarging right middle lobe lung mass.  Patient presented with some hemoptysis.  Pulmonary has been consulted and patient has been seen by Dr. Vaughan Browner who is recommending further evaluation with bronchoscopy early next week.  INR supratherapeutic and as such we will let INR drift down.  Hold Coumadin.  Pulmonary following and appreciate input and recommendations.  2.  Acute on chronic respiratory failure with hypoxia Felt secondary to right middle lobe lung mass and hemoptysis.  Patient noted to have increased O2 requirements.  Continue nebulizer treatments, follow.  3.  UTI Urine cultures pending.  Continue IV Rocephin.  4.  Atrial fibrillation Continue metoprolol for rate control.   INR supratherapeutic.  Coumadin on hold in anticipation of bronchoscopy early next week.  5.  History of remote PE on Coumadin INR supratherapeutic.  Anticoagulation on hold in anticipation of bronchoscopy early next week.  Follow.  6.  Diabetes mellitus type 2 Hemoglobin A1c was 6.5 on 04/05/2018.  CBG was 192 this morning.  Oral hypoglycemic agents.  Continue sliding scale insulin.  7.  Hypertension Stable.  Continue Cozaar, Lopressor, Dyazide.    DVT prophylaxis: INR therapeutic. Code Status: DNR Family Communication: Updated patient.  No family at bedside. Disposition Plan: Pending evaluation by pulmonary.   Consultants:   Pulmonary: Dr. Vaughan Browner 07/29/2018  Procedures:   CT angiogram chest 07/28/2018  Chest x-ray 07/28/2018  Antimicrobials:   IV Rocephin 07/28/2018   Subjective: Patient laying on his side in bed.  Patient denies any chest pain.  Denies any significant shortness of breath.  Per RN patient with some streaks of blood but no significant frank hemoptysis this morning.  Patient with some complaints of urinary hesitancy.  Objective: Vitals:   07/28/18 2100 07/28/18 2218 07/28/18 2230 07/29/18 0347  BP: 118/82 129/61  117/69  Pulse: (!) 129 (!) 107  89  Resp:  (!) 24  (!) 23  Temp:  98.4 F (36.9 C)  99.4 F (37.4 C)  TempSrc:  Oral  Oral  SpO2:  (!) 88% 100% 100%    Intake/Output Summary (Last 24 hours) at 07/29/2018 1230 Last data filed at 07/29/2018 0900 Gross per 24 hour  Intake 1057.68 ml  Output -  Net 1057.68 ml   There were no vitals filed for this visit.  Examination:  General exam: Appears calm and comfortable  Respiratory system: Clear to auscultation. Respiratory effort normal. Cardiovascular system: S1 & S2 heard, RRR. No JVD, murmurs, rubs, gallops or clicks. No pedal edema. Gastrointestinal system: Abdomen is nondistended, soft and nontender. No organomegaly or masses felt. Normal bowel sounds heard. Central nervous system: Alert  and oriented. No focal neurological deficits. Extremities: Symmetric 5 x 5 power. Skin: No rashes, lesions or ulcers Psychiatry: Judgement and insight appear normal. Mood & affect appropriate.     Data Reviewed: I have personally reviewed following labs and imaging studies  CBC: Recent Labs  Lab 07/28/18 1503 07/29/18 0925  WBC 12.6* 14.3*  NEUTROABS 10.2* 11.6*  HGB 10.3* 8.8*  HCT 31.4* 26.1*  MCV 76.6* 75.7*  PLT 347 403   Basic Metabolic Panel: Recent Labs  Lab 07/28/18 1503 07/29/18 0925  NA 138 137  K 4.8 4.1  CL 95* 95*  CO2 29 30  GLUCOSE 83 177*  BUN 35* 40*  CREATININE 1.33* 1.39*  CALCIUM 8.9 8.9  MG  --  2.1   GFR: Estimated Creatinine Clearance: 27.1 mL/min (A) (by C-G formula based on SCr of 1.39 mg/dL (H)). Liver Function Tests: Recent Labs  Lab 07/28/18 1503 07/29/18 0925  AST 35 27  ALT 33 31  ALKPHOS 132* 131*  BILITOT 0.6 0.5  PROT 8.4* 7.5  ALBUMIN 3.2* 2.6*   No results for input(s): LIPASE, AMYLASE in the last 168 hours. No results for input(s): AMMONIA in the last 168 hours. Coagulation Profile: Recent Labs  Lab 07/28/18 1513 07/29/18 0925  INR 3.97 3.90   Cardiac Enzymes: No results for input(s): CKTOTAL, CKMB, CKMBINDEX, TROPONINI in the last 168 hours. BNP (last 3 results) No results for input(s): PROBNP in the last 8760 hours. HbA1C: No results for input(s): HGBA1C in the last 72 hours. CBG: Recent Labs  Lab 07/29/18 0754 07/29/18 1139  GLUCAP 192* 282*   Lipid Profile: No results for input(s): CHOL, HDL, LDLCALC, TRIG, CHOLHDL, LDLDIRECT in the last 72 hours. Thyroid Function Tests: No results for input(s): TSH, T4TOTAL, FREET4, T3FREE, THYROIDAB in the last 72 hours. Anemia Panel: No results for input(s): VITAMINB12, FOLATE, FERRITIN, TIBC, IRON, RETICCTPCT in the last 72 hours. Sepsis Labs: No results for input(s): PROCALCITON, LATICACIDVEN in the last 168 hours.  No results found for this or any previous  visit (from the past 240 hour(s)).       Radiology Studies: Dg Chest 2 View  Result Date: 07/28/2018 CLINICAL DATA:  Hemoptysis EXAM: CHEST - 2 VIEW COMPARISON:  04/06/2018 FINDINGS: New mass lesion right middle lobe measuring approximately 3 cm. Dual lead pacemaker. Heart size upper normal. Negative for heart failure. No effusion. Mild hyperinflation of the lungs IMPRESSION: 3 cm right middle lobe mass, new since 04/06/2018. This is concerning for neoplasm however given its rapid development could be an area of infection. Followup PA and lateral chest X-ray is recommended in 3-4 weeks following trial of antibiotic therapy to ensure resolution and exclude underlying malignancy. Electronically Signed   By: Franchot Gallo M.D.   On: 07/28/2018 10:59   Ct Angio Chest Pe W And/or Wo Contrast  Result Date: 07/28/2018 CLINICAL DATA:  hemoptysis. +weakness, cough and decrease in PO intake x 3 weeks EXAM: CT ANGIOGRAPHY CHEST WITH CONTRAST TECHNIQUE: Multidetector CT imaging of the chest was performed using the standard protocol during bolus administration of intravenous contrast. Multiplanar  CT image reconstructions and MIPs were obtained to evaluate the vascular anatomy. CONTRAST:  148mL ISOVUE-370 IOPAMIDOL (ISOVUE-370) INJECTION 76% COMPARISON:  02/02/2017 FINDINGS: Cardiovascular: Left subclavian dual lead transvenous pacemaker, leads extending into the right atrium and towards the right ventricular apex. Heart size normal. No pericardial effusion. Satisfactory opacification of pulmonary arteries noted, and there is no evidence of pulmonary emboli. Coronary calcifications. There is good contrast opacification of the thoracic aorta. There is eccentric aneurysmal dilatation of the distal arch up to 3.8 cm diameter, aorta returning to normal caliber of 3 cm in the proximal descending segment. Moderate scattered calcified atheromatous plaque in the arch and descending thoracic segment. There is some eccentric  nonocclusive mural thrombus in the visualized proximal abdominal aorta. Tines of infrarenal stent graft are partially visualized. Classic 3 vessel brachiocephalic arterial origin anatomy without proximal stenosis. Mediastinum/Nodes: No hilar or mediastinal adenopathy. Lungs/Pleura: No pleural effusion. No pneumothorax. Pulmonary emphysema (ICD10-J43.9). 4 cm spiculated mass in the right middle lobe, previously 1 cm. 2 subpleural satellite nodules measuring 6 and 10 mm are relatively stable. Smaller nonspecific nodules in the posterior upper lobes are stable. Upper Abdomen: Cholecystectomy clips. Stable bilateral adrenal fullness. No acute findings. Musculoskeletal: Old healed sternal fracture. No acute fracture or worrisome bone lesion. Review of the MIP images confirms the above findings. IMPRESSION: 1. Negative for acute PE or thoracic aortic dissection. 2. Enlarging 4 cm right middle lobe mass suggesting primary bronchogenic carcinoma. Consider pulmonary/thoracic consultation. 3. Coronary and Aortic Atherosclerosis (ICD10-170.0). 4. 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 Recommend annual imaging followup by CTA or MRA. This recommendation follows 2010 ACCF/AHA/AATS/ACR/ASA/SCA/SCAI/SIR/STS/SVM Guidelines for the Diagnosis and Management of Patients with Thoracic Aortic Disease. Circulation.2010; 121: X412-I786 Electronically Signed   By: Lucrezia Europe M.D.   On: 07/28/2018 17:03        Scheduled Meds: . adapalene  1 application Topical Daily  . budesonide (PULMICORT) nebulizer solution  0.5 mg Nebulization BID  . cholecalciferol  1,000 Units Oral Daily  . dextromethorphan-guaiFENesin  1 tablet Oral BID  . feeding supplement (ENSURE ENLIVE)  237 mL Oral BID BM  . insulin aspart  0-15 Units Subcutaneous TID WC  . ipratropium-albuterol  3 mL Nebulization TID  . losartan  25 mg Oral Daily  . metoprolol tartrate  100 mg Oral BID  . multivitamin with minerals  1 tablet Oral Daily  . omega-3  acid ethyl esters  2 g Oral BID  . pantoprazole  40 mg Oral Daily  . pravastatin  20 mg Oral q1800  . Tazarotene   Apply externally Daily  . triamterene-hydrochlorothiazide  1 capsule Oral Daily   Continuous Infusions: . cefTRIAXone (ROCEPHIN)  IV 1 g (07/28/18 2034)     LOS: 1 day    Time spent: 35 mins    Irine Seal, MD [facility]  If 7PM-7AM, please contact night-coverage [email or web address] 07/29/2018, 12:30 PM

## 2018-07-30 LAB — CBC WITH DIFFERENTIAL/PLATELET
Abs Immature Granulocytes: 0.05 10*3/uL (ref 0.00–0.07)
BASOS ABS: 0 10*3/uL (ref 0.0–0.1)
BASOS PCT: 0 %
Eosinophils Absolute: 0.2 10*3/uL (ref 0.0–0.5)
Eosinophils Relative: 2 %
HCT: 26.4 % — ABNORMAL LOW (ref 36.0–46.0)
Hemoglobin: 8.8 g/dL — ABNORMAL LOW (ref 12.0–15.0)
Immature Granulocytes: 0 %
Lymphocytes Relative: 9 %
Lymphs Abs: 1.1 10*3/uL (ref 0.7–4.0)
MCH: 24.7 pg — ABNORMAL LOW (ref 26.0–34.0)
MCHC: 33.3 g/dL (ref 30.0–36.0)
MCV: 74.2 fL — ABNORMAL LOW (ref 80.0–100.0)
Monocytes Absolute: 1.2 10*3/uL — ABNORMAL HIGH (ref 0.1–1.0)
Monocytes Relative: 10 %
Neutro Abs: 9.7 10*3/uL — ABNORMAL HIGH (ref 1.7–7.7)
Neutrophils Relative %: 79 %
PLATELETS: 402 10*3/uL — AB (ref 150–400)
RBC: 3.56 MIL/uL — ABNORMAL LOW (ref 3.87–5.11)
RDW: 14.5 % (ref 11.5–15.5)
WBC: 12.3 10*3/uL — ABNORMAL HIGH (ref 4.0–10.5)
nRBC: 0 % (ref 0.0–0.2)

## 2018-07-30 LAB — GLUCOSE, CAPILLARY
Glucose-Capillary: 173 mg/dL — ABNORMAL HIGH (ref 70–99)
Glucose-Capillary: 276 mg/dL — ABNORMAL HIGH (ref 70–99)
Glucose-Capillary: 184 mg/dL — ABNORMAL HIGH (ref 70–99)
Glucose-Capillary: 157 mg/dL — ABNORMAL HIGH (ref 70–99)

## 2018-07-30 LAB — BASIC METABOLIC PANEL
Anion gap: 12 (ref 5–15)
BUN: 50 mg/dL — ABNORMAL HIGH (ref 8–23)
CO2: 30 mmol/L (ref 22–32)
Calcium: 9 mg/dL (ref 8.9–10.3)
Chloride: 91 mmol/L — ABNORMAL LOW (ref 98–111)
Creatinine, Ser: 1.62 mg/dL — ABNORMAL HIGH (ref 0.44–1.00)
GFR calc non Af Amer: 29 mL/min — ABNORMAL LOW (ref >60)
GFR, EST AFRICAN AMERICAN: 33 mL/min — AB (ref >60)
Glucose, Bld: 198 mg/dL — ABNORMAL HIGH (ref 70–99)
Potassium: 4.4 mmol/L (ref 3.5–5.1)
Sodium: 133 mmol/L — ABNORMAL LOW (ref 135–145)

## 2018-07-30 LAB — URINE CULTURE: Culture: 20000 — AB

## 2018-07-30 LAB — PROTIME-INR
INR: 2.65
Prothrombin Time: 27.9 seconds — ABNORMAL HIGH (ref 11.4–15.2)

## 2018-07-30 MED ORDER — IPRATROPIUM-ALBUTEROL 0.5-2.5 (3) MG/3ML IN SOLN
3.0000 mL | Freq: Two times a day (BID) | RESPIRATORY_TRACT | Status: DC
  Administered 2018-07-30 – 2018-08-03 (×7): 3 mL via RESPIRATORY_TRACT
  Filled 2018-07-30 (×7): qty 3

## 2018-07-30 MED ORDER — SODIUM CHLORIDE 0.9 % IV SOLN
INTRAVENOUS | Status: DC
  Administered 2018-07-30 – 2018-08-03 (×4): via INTRAVENOUS

## 2018-07-30 NOTE — Plan of Care (Signed)
  Problem: Nutrition: Goal: Adequate nutrition will be maintained Outcome: Progressing   Problem: Pain Managment: Goal: General experience of comfort will improve Outcome: Progressing   Problem: Safety: Goal: Ability to remain free from injury will improve Outcome: Progressing   

## 2018-07-30 NOTE — Progress Notes (Signed)
PROGRESS NOTE    Alejandra [name]  HEN:[id number] DOB: 1932-07-17 DOA: 07/28/2018 PCP: Vivi Barrack, MD   Brief Narrative:  Per Dr. Soyla Dryer Alejandra [name] is a 85 y.o. female with medical history significant of COPD on 1-2L O2 at baseline, DM2, HTN, prior PE.  Patient presents to the ED with 2-3 week history of hemoptysis.  Cough perisistent, hemoptysis intermittent.  Wt loss, generalized weakness, and poor appetite.  No fever nor chills.  No N/V/D.  PCPs office CXR showed lung mass, sent in for further work up.   ED Course: CT scan shows 4cm RML lung mass, likely primary bronchogenic carcinoma.  UA shows UTI.   Assessment & Plan:   Principal Problem:   Mass of middle lobe of right lung Active Problems:   COPD (chronic obstructive pulmonary disease) ([name])   Hypertension associated with diabetes ([place])   Anticoagulated on Coumadin   Atrial fibrillation, chronic   Type 2 diabetes mellitus with stage 3 chronic kidney disease, without long-term current use of insulin ([name])   Acute lower UTI   Hemoptysis   Oxygen dependent   Acute on chronic respiratory failure with hypoxia ([place])  1 right middle lobe lung mass Noted on chest x-ray done at PCPs office.  CT angiogram chest concerning for enlarging right middle lobe lung mass.  Patient presented with some hemoptysis.  Pulmonary has been consulted and patient has been seen by Dr. Vaughan Browner who is recommending further evaluation with bronchoscopy early next week.  INR was supratherapeutic on admission.  INR drifting down.  Continue to hold Coumadin. Pulmonary following and appreciate input and recommendations.  2.  Acute on chronic respiratory failure with hypoxia Felt secondary to right middle lobe lung mass and hemoptysis.  Patient noted to have increased O2 requirements.  Continue nebulizer treatments, follow.  3.  Pyuria versus UTI Urine cultures with 20,000 colonies of multiple species present.  Discontinue IV Rocephin  after today's dose.   4.  Atrial fibrillation Continue metoprolol for rate control.  INR currently therapeutic.  Coumadin on hold in anticipation of bronchoscopy early next week.  5.  History of remote PE on Coumadin INR therapeutic range of 2.65 from 3.90.  Anticoagulation on hold in anticipation of bronchoscopy early next week.  Follow.  6.  Diabetes mellitus type 2 Hemoglobin A1c was 6.5 on 04/05/2018.  CBG was 173 this morning.  Oral hypoglycemic agents on hold.  Continue sliding scale insulin.  7.  Hypertension Blood pressure borderline.  Discontinue Cozaar and Dyazide.  Continue Lopressor.  Due to borderline blood pressure placed on IV fluids.  Follow.     DVT prophylaxis: INR therapeutic. Code Status: DNR Family Communication: Updated patient.  No family at bedside. Disposition Plan: Pending evaluation by pulmonary.   Consultants:   Pulmonary: Dr. Vaughan Browner 07/29/2018  Procedures:   CT angiogram chest 07/28/2018  Chest x-ray 07/28/2018  Antimicrobials:   IV Rocephin 07/28/2018   Subjective: Patient laying in bed.  Denies any chest pain or shortness of breath.  Patient still with some streaks of blood when she coughs.    Objective: Vitals:   07/30/18 0747 07/30/18 0751 07/30/18 0921 07/30/18 0922  BP:   105/74   Pulse:   (!) 150 92  Resp:      Temp:      TempSrc:      SpO2: 96% 96% 95%     Intake/Output Summary (Last 24 hours) at 07/30/2018 1133 Last data filed at 07/30/2018 1000 Gross per 24  hour  Intake 800 ml  Output -  Net 800 ml   There were no vitals filed for this visit.  Examination:  General exam: NAD Respiratory system: Lungs clear to auscultation bilaterally.  No wheezes, no crackles, no rhonchi.   Respiratory effort normal. Cardiovascular system: Regular rate rhythm no murmurs rubs or gallops.  No JVD.  No lower extremity edema. Gastrointestinal system: Abdomen is soft, nontender, nondistended, positive bowel sounds.  No rebound.  No guarding.    Central nervous system: Alert and oriented. No focal neurological deficits. Extremities: Symmetric 5 x 5 power. Skin: No rashes, lesions or ulcers Psychiatry: Judgement and insight appear normal. Mood & affect appropriate.     Data Reviewed: I have personally reviewed following labs and imaging studies  CBC: Recent Labs  Lab 07/28/18 1503 07/29/18 0925 07/30/18 0558  WBC 12.6* 14.3* 12.3*  NEUTROABS 10.2* 11.6* 9.7*  HGB 10.3* 8.8* 8.8*  HCT 31.4* 26.1* 26.4*  MCV 76.6* 75.7* 74.2*  PLT 347 355 193*   Basic Metabolic Panel: Recent Labs  Lab 07/28/18 1503 07/29/18 0925 07/30/18 0558  NA 138 137 133*  K 4.8 4.1 4.4  CL 95* 95* 91*  CO2 29 30 30   GLUCOSE 83 177* 198*  BUN 35* 40* 50*  CREATININE 1.33* 1.39* 1.62*  CALCIUM 8.9 8.9 9.0  MG  --  2.1  --    GFR: Estimated Creatinine Clearance: 23.3 mL/min (A) (by C-G formula based on SCr of 1.62 mg/dL (H)). Liver Function Tests: Recent Labs  Lab 07/28/18 1503 07/29/18 0925  AST 35 27  ALT 33 31  ALKPHOS 132* 131*  BILITOT 0.6 0.5  PROT 8.4* 7.5  ALBUMIN 3.2* 2.6*   No results for input(s): LIPASE, AMYLASE in the last 168 hours. No results for input(s): AMMONIA in the last 168 hours. Coagulation Profile: Recent Labs  Lab 07/28/18 1513 07/29/18 0925 07/30/18 0558  INR 3.97 3.90 2.65   Cardiac Enzymes: No results for input(s): CKTOTAL, CKMB, CKMBINDEX, TROPONINI in the last 168 hours. BNP (last 3 results) No results for input(s): PROBNP in the last 8760 hours. HbA1C: No results for input(s): HGBA1C in the last 72 hours. CBG: Recent Labs  Lab 07/29/18 0754 07/29/18 1139 07/29/18 1646 07/29/18 2027 07/30/18 0750  GLUCAP 192* 282* 184* 78 173*   Lipid Profile: No results for input(s): CHOL, HDL, LDLCALC, TRIG, CHOLHDL, LDLDIRECT in the last 72 hours. Thyroid Function Tests: No results for input(s): TSH, T4TOTAL, FREET4, T3FREE, THYROIDAB in the last 72 hours. Anemia Panel: No results for  input(s): VITAMINB12, FOLATE, FERRITIN, TIBC, IRON, RETICCTPCT in the last 72 hours. Sepsis Labs: No results for input(s): PROCALCITON, LATICACIDVEN in the last 168 hours.  Recent Results (from the past 240 hour(s))  Culture, Urine     Status: Abnormal   Collection Time: 07/28/18  5:04 PM  Result Value Ref Range Status   Specimen Description URINE, RANDOM  Final   Special Requests   Final    NONE Performed at [facility] [address], [place], [place] [address]    Culture (A)  Final    20,000 COLONIES/mL MULTIPLE SPECIES PRESENT, SUGGEST RECOLLECTION   Report Status 07/30/2018 FINAL  Final         Radiology Studies: Ct Angio Chest Pe W And/or Wo Contrast  Result Date: 07/28/2018 CLINICAL DATA:  hemoptysis. +weakness, cough and decrease in PO intake x 3 weeks EXAM: CT ANGIOGRAPHY CHEST WITH CONTRAST TECHNIQUE: Multidetector CT imaging of the chest was  performed using the standard protocol during bolus administration of intravenous contrast. Multiplanar CT image reconstructions and MIPs were obtained to evaluate the vascular anatomy. CONTRAST:  121mL ISOVUE-370 IOPAMIDOL (ISOVUE-370) INJECTION 76% COMPARISON:  02/02/2017 FINDINGS: Cardiovascular: Left subclavian dual lead transvenous pacemaker, leads extending into the right atrium and towards the right ventricular apex. Heart size normal. No pericardial effusion. Satisfactory opacification of pulmonary arteries noted, and there is no evidence of pulmonary emboli. Coronary calcifications. There is good contrast opacification of the thoracic aorta. There is eccentric aneurysmal dilatation of the distal arch up to 3.8 cm diameter, aorta returning to normal caliber of 3 cm in the proximal descending segment. Moderate scattered calcified atheromatous plaque in the arch and descending thoracic segment. There is some eccentric nonocclusive mural thrombus in the visualized proximal abdominal aorta. Tines of infrarenal stent  graft are partially visualized. Classic 3 vessel brachiocephalic arterial origin anatomy without proximal stenosis. Mediastinum/Nodes: No hilar or mediastinal adenopathy. Lungs/Pleura: No pleural effusion. No pneumothorax. Pulmonary emphysema (ICD10-J43.9). 4 cm spiculated mass in the right middle lobe, previously 1 cm. 2 subpleural satellite nodules measuring 6 and 10 mm are relatively stable. Smaller nonspecific nodules in the posterior upper lobes are stable. Upper Abdomen: Cholecystectomy clips. Stable bilateral adrenal fullness. No acute findings. Musculoskeletal: Old healed sternal fracture. No acute fracture or worrisome bone lesion. Review of the MIP images confirms the above findings. IMPRESSION: 1. Negative for acute PE or thoracic aortic dissection. 2. Enlarging 4 cm right middle lobe mass suggesting primary bronchogenic carcinoma. Consider pulmonary/thoracic consultation. 3. Coronary and Aortic Atherosclerosis (ICD10-170.0). 4. 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 Recommend annual imaging followup by CTA or MRA. This recommendation follows 2010 ACCF/AHA/AATS/ACR/ASA/SCA/SCAI/SIR/STS/SVM Guidelines for the Diagnosis and Management of Patients with Thoracic Aortic Disease. Circulation.2010; 121: C623-J628 Electronically Signed   By: Lucrezia Europe M.D.   On: 07/28/2018 17:03        Scheduled Meds: . adapalene  1 application Topical Daily  . budesonide (PULMICORT) nebulizer solution  0.5 mg Nebulization BID  . cholecalciferol  1,000 Units Oral Daily  . dextromethorphan-guaiFENesin  1 tablet Oral BID  . feeding supplement (ENSURE ENLIVE)  237 mL Oral BID BM  . insulin aspart  0-15 Units Subcutaneous TID WC  . ipratropium-albuterol  3 mL Nebulization BID  . metoprolol tartrate  100 mg Oral BID  . multivitamin with minerals  1 tablet Oral Daily  . omega-3 acid ethyl esters  2 g Oral BID  . pantoprazole  40 mg Oral Daily  . pravastatin  20 mg Oral q1800  . Tazarotene   Apply  externally Daily   Continuous Infusions: . sodium chloride 100 mL/hr at 07/30/18 0847  . cefTRIAXone (ROCEPHIN)  IV 1 g (07/29/18 2006)     LOS: 2 days    Time spent: 35 mins    Irine Seal, MD [facility]  If 7PM-7AM, please contact night-coverage [email or web address] 07/30/2018, 11:33 AM

## 2018-07-31 ENCOUNTER — Telehealth: Payer: Self-pay | Admitting: General Practice

## 2018-07-31 DIAGNOSIS — R042 Hemoptysis: Secondary | ICD-10-CM

## 2018-07-31 DIAGNOSIS — J9621 Acute and chronic respiratory failure with hypoxia: Secondary | ICD-10-CM

## 2018-07-31 DIAGNOSIS — E440 Moderate protein-calorie malnutrition: Secondary | ICD-10-CM

## 2018-07-31 DIAGNOSIS — Z7901 Long term (current) use of anticoagulants: Secondary | ICD-10-CM

## 2018-07-31 LAB — CBC WITH DIFFERENTIAL/PLATELET
Abs Immature Granulocytes: 0.06 10*3/uL (ref 0.00–0.07)
Basophils Absolute: 0 10*3/uL (ref 0.0–0.1)
Basophils Relative: 0 %
Eosinophils Absolute: 0.2 10*3/uL (ref 0.0–0.5)
Eosinophils Relative: 2 %
HCT: 23.7 % — ABNORMAL LOW (ref 36.0–46.0)
Hemoglobin: 7.9 g/dL — ABNORMAL LOW (ref 12.0–15.0)
Immature Granulocytes: 1 %
Lymphocytes Relative: 7 %
Lymphs Abs: 0.9 10*3/uL (ref 0.7–4.0)
MCH: 25 pg — ABNORMAL LOW (ref 26.0–34.0)
MCHC: 33.3 g/dL (ref 30.0–36.0)
MCV: 75 fL — ABNORMAL LOW (ref 80.0–100.0)
Monocytes Absolute: 1.4 10*3/uL — ABNORMAL HIGH (ref 0.1–1.0)
Monocytes Relative: 11 %
Neutro Abs: 9.7 10*3/uL — ABNORMAL HIGH (ref 1.7–7.7)
Neutrophils Relative %: 79 %
Platelets: 362 10*3/uL (ref 150–400)
RBC: 3.16 MIL/uL — AB (ref 3.87–5.11)
RDW: 14.6 % (ref 11.5–15.5)
WBC: 12.2 10*3/uL — ABNORMAL HIGH (ref 4.0–10.5)
nRBC: 0 % (ref 0.0–0.2)

## 2018-07-31 LAB — BASIC METABOLIC PANEL
Anion gap: 12 (ref 5–15)
BUN: 36 mg/dL — ABNORMAL HIGH (ref 8–23)
CO2: 27 mmol/L (ref 22–32)
CREATININE: 1.27 mg/dL — AB (ref 0.44–1.00)
Calcium: 8.3 mg/dL — ABNORMAL LOW (ref 8.9–10.3)
Chloride: 98 mmol/L (ref 98–111)
GFR calc Af Amer: 45 mL/min — ABNORMAL LOW (ref >60)
GFR calc non Af Amer: 39 mL/min — ABNORMAL LOW (ref >60)
Glucose, Bld: 175 mg/dL — ABNORMAL HIGH (ref 70–99)
Potassium: 4 mmol/L (ref 3.5–5.1)
Sodium: 137 mmol/L (ref 135–145)

## 2018-07-31 LAB — PROTIME-INR
INR: 2.16
Prothrombin Time: 23.8 seconds — ABNORMAL HIGH (ref 11.4–15.2)

## 2018-07-31 LAB — GLUCOSE, CAPILLARY
Glucose-Capillary: 201 mg/dL — ABNORMAL HIGH (ref 70–99)
Glucose-Capillary: 163 mg/dL — ABNORMAL HIGH (ref 70–99)
Glucose-Capillary: 259 mg/dL — ABNORMAL HIGH (ref 70–99)
Glucose-Capillary: 198 mg/dL — ABNORMAL HIGH (ref 70–99)
Glucose-Capillary: 188 mg/dL — ABNORMAL HIGH (ref 70–99)

## 2018-07-31 NOTE — Progress Notes (Signed)
Initial Nutrition Assessment  DOCUMENTATION CODES:   Non-severe (moderate) malnutrition in context of chronic illness  INTERVENTION:   Continue Ensure Enlive po BID, each supplement provides 350 kcal and 20 grams of protein  NUTRITION DIAGNOSIS:   Moderate Malnutrition related to chronic illness(COPD) as evidenced by moderate fat depletion, severe muscle depletion.  GOAL:   Patient will meet greater than or equal to 90% of their needs  MONITOR:   PO intake, Supplement acceptance, Labs, Weight trends, I & O's  REASON FOR ASSESSMENT:   Malnutrition Screening Tool    ASSESSMENT:   85 year old with history of COPD, emphysema on 2 lt home O2, ex-smoker admitted 1/10 with several weeks of intermittent hemoptysis, red mucus. She had a chest x-ray at primary care which showed right lung mass and was sent to the ED for further evaluation.  Patient currently consuming 75-85% of meals. PTA pt reports poor appetite. Pt is drinking Ensure supplements with no issue.   Over the past year, pt's weight has stayed between 137-142 lb. Pt with very little weight loss recently.   Medications: Vitamin D tablet daily, Multivitamin with minerals daily, Lovaza capsule BID Labs reviewed: CBGs: 163-259 GFR: 45   NUTRITION - FOCUSED PHYSICAL EXAM:    Most Recent Value  Orbital Region  No depletion  Upper Arm Region  Moderate depletion  Thoracic and Lumbar Region  Unable to assess  Buccal Region  Moderate depletion  Temple Region  Moderate depletion  Clavicle Bone Region  Severe depletion  Clavicle and Acromion Bone Region  Severe depletion  Scapular Bone Region  Unable to assess  Dorsal Hand  Moderate depletion  Patellar Region  Unable to assess  Anterior Thigh Region  Unable to assess  Posterior Calf Region  Unable to assess  Edema (RD Assessment)  None       Diet Order:   Diet Order            Diet Carb Modified Fluid consistency: Thin; Room service appropriate? Yes  Diet  effective now              EDUCATION NEEDS:   No education needs have been identified at this time  Skin:  Skin Assessment: Reviewed RN Assessment  Last BM:  1/13  Height:   Ht Readings from Last 1 Encounters:  07/31/18 5\' 5"  (1.651 m)    Weight:   Wt Readings from Last 1 Encounters:  07/31/18 61.3 kg    Ideal Body Weight:  56.8 kg  BMI:  Body mass index is 22.5 kg/m.  Estimated Nutritional Needs:   Kcal:  1700-1900  Protein:  80-90g  Fluid:  1.7L/day   Clayton Bibles, MS, RD, LDN [facility] Pager: [phone] After Hours Pager: [phone]

## 2018-07-31 NOTE — Progress Notes (Signed)
Patient pulled her IV out while getting up to go to bathroom. Paged IV team to get new IV but unfortunately IV nurse could not get new peripheral IV after 2 unsuccessful attempts. Paged MD as well.

## 2018-07-31 NOTE — Telephone Encounter (Signed)
Patient's daughter Mateo Flow called and left a message to inform us that patient is in the hospital and that she would not be able to make her INR appointment tomorrow.  I attempted to reach Mateo Flow but her VM is full.  I left a message with a female in the home to let her know that I did get the message and that I would cancel the appointment.

## 2018-07-31 NOTE — Progress Notes (Signed)
NAME:  Alejandra [name], MRN:  [id number], DOB:  05/24/1932, LOS: 3 ADMISSION DATE:  07/28/2018, CONSULTATION DATE:  07/29/18 REFERRING MD:  Cline Cools MD, CHIEF COMPLAINT:  Lung mass, hemoptysis  Brief History   85 year old with history of COPD, emphysema on 2 lt home O2, ex-smoker admitted 1/10 with several weeks of intermittent hemoptysis, red mucus. She had a chest x-ray at primary care which showed right lung mass and was sent to the ED for further evaluation.  Past Medical History  CKD, hypertension, diabetes, AAA, COPD, emphysema She has been on Coumadin for unprovoked PE in July 2011. Was former pt of Dr. 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  I have reviewed the images personally.  Micro Data:    Antimicrobials:    Interim history/subjective:  Pt reports ongoing hemoptysis, no appetite.  Daughter reports 9lb weight loss since 04/2018, states patient has been looking at obituaries and stating she is "ready to go" & that she "is tired".  Patient states she does not want to know what the mass is but her daughter does.  Requesting discussion with brother, patient and MD in am at 61.  Objective   Blood pressure 113/68, pulse 89, temperature 98.4 F (36.9 C), temperature source Oral, resp. rate 16, height 5\' 5"  (1.651 m), weight 61.3 kg, SpO2 99 %.        Intake/Output Summary (Last 24 hours) at 07/31/2018 1111 Last data filed at 07/31/2018 0539 Gross per 24 hour  Intake 2520.08 ml  Output -  Net 2520.08 ml   Filed Weights   07/31/18 0746  Weight: 61.3 kg    Examination: General: elderly female lying in bed in NAD  HEENT: MM pink/moist, no jvd, poor dentition  Neuro: AAOx4, speech clear,  MAE  CV: s1s2 rrr, no m/r/g PULM: even/non-labored, lungs bilaterally clear  JQ:BHAL, non-tender, bsx4 active  Extremities: warm/dry, no edema  Skin: no rashes or lesions  Resolved Hospital Problem list    Assessment & Plan:   Right middle lobe mass, hemoptysis -2018 CT imaging showed scattered pulmonary nodules, RML was a small nodule at that time.  Now presents with ~ 4cm RML irregular mass.  P: Have arranged for ENB on 1/15 per Dr. Loanne Drilling at 3pm  Will need to arrange for [facility] to have patient at [facility] by 1pm at Short Stay for the 15th Patient is considering not having biopsy performed as she would not want chemo/XRT  History of remote PE on Coumadin P: Allow INR to drift down > 2.16 1/13  Follow up in am, if remains >2, consider reversal   Labs   CBC: Recent Labs  Lab 07/28/18 1503 07/29/18 0925 07/30/18 0558 07/31/18 0639  WBC 12.6* 14.3* 12.3* 12.2*  NEUTROABS 10.2* 11.6* 9.7* 9.7*  HGB 10.3* 8.8* 8.8* 7.9*  HCT 31.4* 26.1* 26.4* 23.7*  MCV 76.6* 75.7* 74.2* 75.0*  PLT 347 355 402* 937    Basic Metabolic Panel: Recent Labs  Lab 07/28/18 1503 07/29/18 0925 07/30/18 0558 07/31/18 0639  NA 138 137 133* 137  K 4.8 4.1 4.4 4.0  CL 95* 95* 91* 98  CO2 29 30 30 27   GLUCOSE 83 177* 198* 175*  BUN 35* 40* 50*  36*  CREATININE 1.33* 1.39* 1.62* 1.27*  CALCIUM 8.9 8.9 9.0 8.3*  MG  --  2.1  --   --    GFR: Estimated Creatinine Clearance: 29.7 mL/min (A) (by C-G formula based on SCr of 1.27 mg/dL (H)). Recent Labs  Lab 07/28/18 1503 07/29/18 0925 07/30/18 0558 07/31/18 0639  WBC 12.6* 14.3* 12.3* 12.2*    Liver Function Tests: Recent Labs  Lab 07/28/18 1503 07/29/18 0925  AST 35 27  ALT 33 31  ALKPHOS 132* 131*  BILITOT 0.6 0.5  PROT 8.4* 7.5  ALBUMIN 3.2* 2.6*   No results for input(s): LIPASE, AMYLASE in the last 168 hours. No results for input(s): AMMONIA in the last 168 hours.  ABG    Component Value Date/Time   TCO2 28 02/02/2017  1843     Coagulation Profile: Recent Labs  Lab 07/28/18 1513 07/29/18 0925 07/30/18 0558 07/31/18 0639  INR 3.97 3.90 2.65 2.16    Cardiac Enzymes: No results for input(s): CKTOTAL, CKMB, CKMBINDEX, TROPONINI in the last 168 hours.  HbA1C: Hgb A1c MFr Bld  Date/Time Value Ref Range Status  04/05/2018 11:00 AM 6.5 4.6 - 6.5 % Final    Comment:    Glycemic Control Guidelines for People with Diabetes:Non Diabetic:  <6%Goal of Therapy: <7%Additional Action Suggested:  >8%   09/28/2017 11:29 AM 6.1 4.6 - 6.5 % Final    Comment:    Glycemic Control Guidelines for People with Diabetes:Non Diabetic:  <6%Goal of Therapy: <7%Additional Action Suggested:  >8%     CBG: Recent Labs  Lab 07/30/18 0750 07/30/18 1133 07/30/18 1648 07/30/18 2021 07/31/18 0755  GLUCAP 173* 276* 184* 157* 163*    Noe Gens, NP-C [facility] Pgr: [phone] or if no answer [phone] 07/31/2018, 11:11 AM

## 2018-07-31 NOTE — Progress Notes (Signed)
Pt's new IV (received new one at shift change this evening) can be flushed but it does not have blood return. Talked to IV team nurse and they plan on putting in midline in AM. Receiving no IV meds, but if needed the IV can still be flushed without discomfort or swelling. Paging on call MD to update.

## 2018-07-31 NOTE — Progress Notes (Signed)
PROGRESS NOTE    Alejandra [name]  HYW:[id number] DOB: Jul 15, 1932 DOA: 07/28/2018 PCP: Vivi Barrack, MD   Brief Narrative:  Per Dr. Soyla Dryer Alejandra [name] is a 85 y.o. female with medical history significant of COPD on 1-2L O2 at baseline, DM2, HTN, prior PE.  Patient presents to the ED with 2-3 week history of hemoptysis.  Cough perisistent, hemoptysis intermittent.  Wt loss, generalized weakness, and poor appetite.  No fever nor chills.  No N/V/D.  PCPs office CXR showed lung mass, sent in for further work up.   ED Course: CT scan shows 4cm RML lung mass, likely primary bronchogenic carcinoma.  UA shows UTI.   Assessment & Plan:   Principal Problem:   Mass of middle lobe of right lung Active Problems:   COPD (chronic obstructive pulmonary disease) ([name])   Hypertension associated with diabetes ([place])   Anticoagulated on Coumadin   Atrial fibrillation, chronic   Type 2 diabetes mellitus with stage 3 chronic kidney disease, without long-term current use of insulin ([name])   Acute lower UTI   Hemoptysis   Oxygen dependent   Acute on chronic respiratory failure with hypoxia ([place])  1 right middle lobe lung mass Noted on chest x-ray done at PCPs office.  CT angiogram chest concerning for enlarging right middle lobe lung mass.  Patient presented with some hemoptysis.  Pulmonary has been consulted and patient has been seen by Dr. Vaughan Browner who is recommending further evaluation with bronchoscopy this week.  INR was supratherapeutic on admission.  INR drifting down.  Continue to hold Coumadin. Pulmonary following and appreciate input and recommendations.  2.  Acute on chronic respiratory failure with hypoxia Felt secondary to right middle lobe lung mass and hemoptysis.  Patient noted to have increased O2 requirements.  Continue nebulizer treatments, follow.  3.  Pyuria versus UTI Urine cultures with 20,000 colonies of multiple species present.  Status post 3 days IV Rocephin.   Antibiotics discontinued.    4.  Atrial fibrillation Continue metoprolol for rate control.  INR currently therapeutic.  Coumadin on hold in anticipation of bronchoscopy this week.  We will not bridge anticoagulation.    5.  History of remote PE on Coumadin INR therapeutic range of 2.16 from 2.65 from 3.90.  Anticoagulation on hold in anticipation of bronchoscopy this week. Follow.  6.  Diabetes mellitus type 2 Hemoglobin A1c was 6.5 on 04/05/2018.  CBG was 163 this morning.  Oral hypoglycemic agents on hold.  Continue sliding scale insulin.  7.  Hypertension Blood pressure borderline.  Discontinued Cozaar and Dyazide.  Continue Lopressor.  Due to borderline blood pressure placed on IV fluids.  Follow.     DVT prophylaxis: INR therapeutic. Code Status: DNR Family Communication: Updated patient and son and daughter and family at bedside. Disposition Plan: Pending evaluation by pulmonary.   Consultants:   Pulmonary: Dr. Vaughan Browner 07/29/2018  Procedures:   CT angiogram chest 07/28/2018  Chest x-ray 07/28/2018  Antimicrobials:   IV Rocephin 07/28/2018>>>>> 07/30/2018   Subjective: Patient laying in bed.  No chest pain.  No shortness of breath.  Still with some streaks of blood when she coughs.  No dizziness.  No lightheadedness.  Family at bedside.    Objective: Vitals:   07/31/18 0522 07/31/18 0746 07/31/18 0800 07/31/18 0909  BP: 116/69 113/68    Pulse: 90 89    Resp: 18 16    Temp: 98.4 F (36.9 C) 98.4 F (36.9 C)    TempSrc: Oral Oral  SpO2: 98%  94% 99%  Weight:  61.3 kg    Height:  5\' 5"  (1.651 m)      Intake/Output Summary (Last 24 hours) at 07/31/2018 1020 Last data filed at 07/31/2018 0835 Gross per 24 hour  Intake 2520.08 ml  Output -  Net 2520.08 ml   Filed Weights   07/31/18 0746  Weight: 61.3 kg    Examination:  General exam: NAD Respiratory system: CTAB. No wheezes, no crackles, no rhonchi.   Respiratory effort normal. Cardiovascular system: RRR  no murmurs rubs or gallops.  No lower extremity edema.  No JVD.  Gastrointestinal system: Abdomen is nontender, nondistended, soft, positive bowel sounds.  No rebound.  No guarding.  Central nervous system: Alert and oriented. No focal neurological deficits. Extremities: Symmetric 5 x 5 power. Skin: No rashes, lesions or ulcers Psychiatry: Judgement and insight appear normal. Mood & affect appropriate.     Data Reviewed: I have personally reviewed following labs and imaging studies  CBC: Recent Labs  Lab 07/28/18 1503 07/29/18 0925 07/30/18 0558 07/31/18 0639  WBC 12.6* 14.3* 12.3* 12.2*  NEUTROABS 10.2* 11.6* 9.7* 9.7*  HGB 10.3* 8.8* 8.8* 7.9*  HCT 31.4* 26.1* 26.4* 23.7*  MCV 76.6* 75.7* 74.2* 75.0*  PLT 347 355 402* 824   Basic Metabolic Panel: Recent Labs  Lab 07/28/18 1503 07/29/18 0925 07/30/18 0558 07/31/18 0639  NA 138 137 133* 137  K 4.8 4.1 4.4 4.0  CL 95* 95* 91* 98  CO2 29 30 30 27   GLUCOSE 83 177* 198* 175*  BUN 35* 40* 50* 36*  CREATININE 1.33* 1.39* 1.62* 1.27*  CALCIUM 8.9 8.9 9.0 8.3*  MG  --  2.1  --   --    GFR: Estimated Creatinine Clearance: 29.7 mL/min (A) (by C-G formula based on SCr of 1.27 mg/dL (H)). Liver Function Tests: Recent Labs  Lab 07/28/18 1503 07/29/18 0925  AST 35 27  ALT 33 31  ALKPHOS 132* 131*  BILITOT 0.6 0.5  PROT 8.4* 7.5  ALBUMIN 3.2* 2.6*   No results for input(s): LIPASE, AMYLASE in the last 168 hours. No results for input(s): AMMONIA in the last 168 hours. Coagulation Profile: Recent Labs  Lab 07/28/18 1513 07/29/18 0925 07/30/18 0558 07/31/18 0639  INR 3.97 3.90 2.65 2.16   Cardiac Enzymes: No results for input(s): CKTOTAL, CKMB, CKMBINDEX, TROPONINI in the last 168 hours. BNP (last 3 results) No results for input(s): PROBNP in the last 8760 hours. HbA1C: No results for input(s): HGBA1C in the last 72 hours. CBG: Recent Labs  Lab 07/30/18 0750 07/30/18 1133 07/30/18 1648 07/30/18 2021  07/31/18 0755  GLUCAP 173* 276* 184* 157* 163*   Lipid Profile: No results for input(s): CHOL, HDL, LDLCALC, TRIG, CHOLHDL, LDLDIRECT in the last 72 hours. Thyroid Function Tests: No results for input(s): TSH, T4TOTAL, FREET4, T3FREE, THYROIDAB in the last 72 hours. Anemia Panel: No results for input(s): VITAMINB12, FOLATE, FERRITIN, TIBC, IRON, RETICCTPCT in the last 72 hours. Sepsis Labs: No results for input(s): PROCALCITON, LATICACIDVEN in the last 168 hours.  Recent Results (from the past 240 hour(s))  Culture, Urine     Status: Abnormal   Collection Time: 07/28/18  5:04 PM  Result Value Ref Range Status   Specimen Description URINE, RANDOM  Final   Special Requests   Final    NONE Performed at [facility] [address] [place], [place] [address]    Culture (A)  Final    20,000 COLONIES/mL MULTIPLE  SPECIES PRESENT, SUGGEST RECOLLECTION   Report Status 07/30/2018 FINAL  Final         Radiology Studies: No results found.      Scheduled Meds: . adapalene  1 application Topical Daily  . budesonide (PULMICORT) nebulizer solution  0.5 mg Nebulization BID  . cholecalciferol  1,000 Units Oral Daily  . dextromethorphan-guaiFENesin  1 tablet Oral BID  . feeding supplement (ENSURE ENLIVE)  237 mL Oral BID BM  . insulin aspart  0-15 Units Subcutaneous TID WC  . ipratropium-albuterol  3 mL Nebulization BID  . metoprolol tartrate  100 mg Oral BID  . multivitamin with minerals  1 tablet Oral Daily  . omega-3 acid ethyl esters  2 g Oral BID  . pantoprazole  40 mg Oral Daily  . pravastatin  20 mg Oral q1800  . Tazarotene   Apply externally Daily   Continuous Infusions: . sodium chloride 75 mL/hr at 07/31/18 0835     LOS: 3 days    Time spent: 35 mins    Irine Seal, MD [facility]  If 7PM-7AM, please contact night-coverage [email or web address] 07/31/2018, 10:20 AM

## 2018-07-31 NOTE — Care Management Important Message (Signed)
Important Message  Patient Details  Name: Alejandra [name] MRN: [id number] Date of Birth: 1932/07/17   Medicare Important Message Given:  Yes    Kerin Salen 07/31/2018, 11:42 AMImportant Message  Patient Details  Name: Alejandra [name] MRN: [id number] Date of Birth: February 11, 1932   Medicare Important Message Given:  Yes    Kerin Salen 07/31/2018, 11:42 AM

## 2018-08-01 ENCOUNTER — Ambulatory Visit: Payer: Medicare Other

## 2018-08-01 ENCOUNTER — Telehealth: Payer: Self-pay | Admitting: Family Medicine

## 2018-08-01 LAB — CBC
HCT: 26.5 % — ABNORMAL LOW (ref 36.0–46.0)
Hemoglobin: 8.7 g/dL — ABNORMAL LOW (ref 12.0–15.0)
MCH: 24.8 pg — ABNORMAL LOW (ref 26.0–34.0)
MCHC: 32.8 g/dL (ref 30.0–36.0)
MCV: 75.5 fL — AB (ref 80.0–100.0)
Platelets: 367 10*3/uL (ref 150–400)
RBC: 3.51 MIL/uL — ABNORMAL LOW (ref 3.87–5.11)
RDW: 14.8 % (ref 11.5–15.5)
WBC: 13.1 10*3/uL — ABNORMAL HIGH (ref 4.0–10.5)
nRBC: 0 % (ref 0.0–0.2)

## 2018-08-01 LAB — BASIC METABOLIC PANEL
Anion gap: 12 (ref 5–15)
BUN: 21 mg/dL (ref 8–23)
CHLORIDE: 98 mmol/L (ref 98–111)
CO2: 27 mmol/L (ref 22–32)
CREATININE: 1.06 mg/dL — AB (ref 0.44–1.00)
Calcium: 8.8 mg/dL — ABNORMAL LOW (ref 8.9–10.3)
GFR calc Af Amer: 56 mL/min — ABNORMAL LOW (ref >60)
GFR calc non Af Amer: 48 mL/min — ABNORMAL LOW (ref >60)
Glucose, Bld: 233 mg/dL — ABNORMAL HIGH (ref 70–99)
Potassium: 3.8 mmol/L (ref 3.5–5.1)
Sodium: 137 mmol/L (ref 135–145)

## 2018-08-01 LAB — GLUCOSE, CAPILLARY
Glucose-Capillary: 227 mg/dL — ABNORMAL HIGH (ref 70–99)
Glucose-Capillary: 265 mg/dL — ABNORMAL HIGH (ref 70–99)
Glucose-Capillary: 164 mg/dL — ABNORMAL HIGH (ref 70–99)
Glucose-Capillary: 262 mg/dL — ABNORMAL HIGH (ref 70–99)

## 2018-08-01 LAB — PROTIME-INR
INR: 1.57
PROTHROMBIN TIME: 18.6 s — AB (ref 11.4–15.2)

## 2018-08-01 NOTE — Progress Notes (Signed)
Inpatient Diabetes Program Recommendations  AACE/ADA: New Consensus Statement on Inpatient Glycemic Control (2015)  Target Ranges:  Prepandial:   less than 140 mg/dL      Peak postprandial:   less than 180 mg/dL (1-2 hours)      Critically ill patients:  140 - 180 mg/dL   Results for BELVIE, IRIBE (MRN [id number]) as of 08/01/2018 12:30  Ref. Range 07/31/2018 07:55 07/31/2018 12:02 07/31/2018 16:41 07/31/2018 20:08  Glucose-Capillary Latest Ref Range: 70 - 99 mg/dL 163 (H)  3 units NOVOLOG  259 (H)  8 units NOVOLOG  198 (H)  3 units NOVOLOG  188 (H)   Results for ZILPHA, MCANDREW (MRN [id number]) as of 08/01/2018 12:30  Ref. Range 08/01/2018 07:48 08/01/2018 11:38  Glucose-Capillary Latest Ref Range: 70 - 99 mg/dL 227 (H)  5 units NOVOLOG  265 (H)  5 units NOVOLOG     Home DM Meds: Glipizide 5 mg BID        Metformin 500 mg BID  Current Orders: Novolog Moderate Correction Scale/ SSI (0-15 units) TID AC      MD- Note CBGs running high.  Home oral DM meds are on hold.  Patient getting Ensure Feeding Supplements BID between meals which may be contributing to hyperglycemia.  May consider adding low dose basal insulin to regimen while home oral meds are on hold:  Lantus 9 units QHS (0.15 units/kg dosing based on weight of 61 kg)      --Will follow patient during hospitalization--  Wyn Quaker RN, MSN, CDE Diabetes Coordinator Inpatient Glycemic Control Team Team Pager: [phone] (8a-5p)

## 2018-08-01 NOTE — Telephone Encounter (Signed)
Copied from [place] [id number]. Topic: General - Other >> Aug 01, 2018 11:50 AM Sheran Luz wrote: Reason for CRM: Patients daughter calling to request neck brace for patient as discussed at OV on 1/10, stating patient is currently admitted in hospital ([facility]) and inquired if orders could be sent there. Please advise.

## 2018-08-01 NOTE — Progress Notes (Addendum)
NAME:  Alejandra [name], MRN:  [id number], DOB:  Oct 12, 1931, LOS: 4 ADMISSION DATE:  07/28/2018, CONSULTATION DATE:  07/29/18 REFERRING MD:  Cline Cools MD, CHIEF COMPLAINT:  Lung mass, hemoptysis  Brief History   85 year old with history of COPD, emphysema on 2 lt home O2, ex-smoker admitted 1/10 with several weeks of intermittent hemoptysis, red mucus. She had a chest x-ray at primary care which showed right lung mass and was sent to the ED for further evaluation.  Past Medical History  CKD, hypertension, diabetes, AAA, COPD, emphysema She has been on Coumadin for unprovoked PE in July 2011. Was former pt of Dr. 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   Micro Data:    Antimicrobials:    Interim history/subjective:  Reports frequent episodes of bright red blood mixed with sputum several times throughout the day. Chronically feels short of breath and unsure if it is worse. Of note, she has required increased oxygen since admission. Denies chest pain.  Family discussion held with patient's son, daughter and niece. Discussed risks and benefits of procedure and offered option to decline procedure. Discussed benefit of involving palliative care. After addressing patient and family's questions to the best of knowledge, decision was made to pursue diagnostic bronchoscopy.   Objective   Blood pressure (!) 125/57, pulse 88, temperature 98.4 F (36.9 C), temperature source Oral, resp. rate 13, height 5\' 5"  (1.651 m), weight 61.3 kg, SpO2 100 %.        Intake/Output Summary (Last 24 hours) at 08/01/2018 1055 Last data filed at 08/01/2018 0853 Gross per 24 hour  Intake 307.51 ml  Output -  Net 307.51 ml   Filed  Weights   07/31/18 0746  Weight: 61.3 kg    Physical Exam: General: Well-appearing, no acute distress HENT: [place], AT, OP clear, MMM Eyes: EOMI, no scleral icterus Respiratory: Clear to auscultation bilaterally.  No crackles, wheezing or rales Cardiovascular: RRR, -M/R/G, no JVD GI: BS+, soft, nontender Extremities:-Edema,-tenderness Neuro: AAO x4, CNII-XII grossly intact Skin: Intact, no rashes or bruising Psych: Normal mood, normal affect   Resolved Hospital Problem list    Assessment & Plan:   Right middle lobe mass Hemoptysis -2018 CT imaging showed scattered pulmonary nodules, RML was a small nodule at that time.  Now presents with ~ 4cm RML irregular mass concerning for malignancy. P: -ENB scheduled for 1/15 at [facility] contacted today to arrange transport -Please ensure patient is NPO at midnight tonight.   History of remote unprovoked PE on Coumadin -INR <2 today P: -Recommend heparin gtt to bridge. Will need to discontinue gtt 6 hours prior to procedure (scheduled for 3PM on 08/02/18)  Labs   CBC: Recent Labs  Lab 07/28/18 1503 07/29/18 0925 07/30/18 0558 07/31/18 0639 08/01/18 0835  WBC 12.6* 14.3* 12.3* 12.2* 13.1*  NEUTROABS 10.2* 11.6* 9.7* 9.7*  --   HGB 10.3* 8.8* 8.8* 7.9* 8.7*  HCT 31.4* 26.1* 26.4* 23.7* 26.5*  MCV 76.6* 75.7* 74.2* 75.0* 75.5*  PLT 347 355 402* 362 025    Basic Metabolic Panel: Recent Labs  Lab 07/28/18 1503 07/29/18 0925 07/30/18 0558 07/31/18 0639 08/01/18 0835  NA 138 137 133* 137 137  K 4.8 4.1 4.4 4.0 3.8  CL 95* 95* 91* 98 98  CO2 29 30 30 27 27   GLUCOSE 83 177* 198* 175* 233*  BUN 35* 40* 50* 36* 21  CREATININE 1.33* 1.39* 1.62* 1.27* 1.06*  CALCIUM 8.9 8.9 9.0 8.3* 8.8*  MG  --  2.1  --   --   --    GFR: Estimated Creatinine Clearance: 35.6 mL/min (A) (by C-G formula based on SCr of 1.06 mg/dL (H)). Recent Labs  Lab 07/29/18 0925 07/30/18 0558 07/31/18 0639 08/01/18 0835  WBC 14.3* 12.3* 12.2*  13.1*    Liver Function Tests: Recent Labs  Lab 07/28/18 1503 07/29/18 0925  AST 35 27  ALT 33 31  ALKPHOS 132* 131*  BILITOT 0.6 0.5  PROT 8.4* 7.5  ALBUMIN 3.2* 2.6*   No results for input(s): LIPASE, AMYLASE in the last 168 hours. No results for input(s): AMMONIA in the last 168 hours.  ABG    Component Value Date/Time   TCO2 28 02/02/2017 1843     Coagulation Profile: Recent Labs  Lab 07/28/18 1513 07/29/18 0925 07/30/18 0558 07/31/18 0639 08/01/18 0835  INR 3.97 3.90 2.65 2.16 1.57    Cardiac Enzymes: No results for input(s): CKTOTAL, CKMB, CKMBINDEX, TROPONINI in the last 168 hours.  HbA1C: Hgb A1c MFr Bld  Date/Time Value Ref Range Status  04/05/2018 11:00 AM 6.5 4.6 - 6.5 % Final    Comment:    Glycemic Control Guidelines for People with Diabetes:Non Diabetic:  <6%Goal of Therapy: <7%Additional Action Suggested:  >8%   09/28/2017 11:29 AM 6.1 4.6 - 6.5 % Final    Comment:    Glycemic Control Guidelines for People with Diabetes:Non Diabetic:  <6%Goal of Therapy: <7%Additional Action Suggested:  >8%     CBG: Recent Labs  Lab 07/31/18 0755 07/31/18 1202 07/31/18 1641 07/31/18 2008 08/01/18 0748  GLUCAP 163* 259* 198* 188* 227*    Independent Critical Care Time devoted to patient care services described in this note is  43 minutes.   Rodman Pickle, M.D. [facility] Pulmonary/Critical Care Medicine Pager: [phone] After hours pager: [phone]  Amaryllis Malmquist Rodman Pickle, MD [place] 08/01/2018 10:55 AM  Personal pager: [phone] If unanswered, please page CCM On-call: [phone]

## 2018-08-01 NOTE — Telephone Encounter (Signed)
Spoke with Alejandra [name] she will talk to hospital about a neck brace.

## 2018-08-01 NOTE — Progress Notes (Signed)
   08/01/18 2023  Vitals  Temp (!) 100.5 F (38.1 C)  BP 123/64  MAP (mmHg) 81  BP Location Right Arm  BP Method Automatic  Patient Position (if appropriate) Lying  Pulse Rate (!) 102  Pulse Rate Source Monitor  Resp (!) 21  Oxygen Therapy  SpO2 100 %  O2 Device Nasal Cannula  O2 Flow Rate (L/min) 6 L/min  mews initiated

## 2018-08-01 NOTE — Progress Notes (Signed)
Patient has working PIV, midline not needed anymore per MD. RN aware.

## 2018-08-01 NOTE — Progress Notes (Signed)
PROGRESS NOTE    Alejandra [name]  DPO:[id number] DOB: [birth year] DOA: 07/28/2018 PCP: Vivi Barrack, MD   Brief Narrative:  Per Dr. Soyla Dryer Alejandra [name] is a 85 y.o. female with medical history significant of COPD on 1-2L O2 at baseline, DM2, HTN, prior PE.  Patient presents to the ED with 2-3 week history of hemoptysis.  Cough perisistent, hemoptysis intermittent.  Wt loss, generalized weakness, and poor appetite.  No fever nor chills.  No N/V/D.  PCPs office CXR showed lung mass, sent in for further work up.   ED Course: CT scan shows 4cm RML lung mass, likely primary bronchogenic carcinoma.  UA shows UTI.   Assessment & Plan:   Principal Problem:   Mass of middle lobe of right lung Active Problems:   COPD (chronic obstructive pulmonary disease) ([name])   Hypertension associated with diabetes ([place])   Anticoagulated on Coumadin   Atrial fibrillation, chronic   Type 2 diabetes mellitus with stage 3 chronic kidney disease, without long-term current use of insulin ([name])   Acute lower UTI   Hemoptysis   Oxygen dependent   Acute on chronic respiratory failure with hypoxia ([name])   Malnutrition of moderate degree  1 right middle lobe lung mass Noted on chest x-ray done at PCPs office.  CT angiogram chest concerning for enlarging right middle lobe lung mass.  Patient presented with some hemoptysis.  Pulmonary has been consulted and patient has been seen by Dr. Vaughan Browner who is recommending further evaluation with bronchoscopy this week.  INR was supratherapeutic on admission.  INR drifting down and now at 1.57.  Coumadin on hold.  Patient for probable bronchoscopy tomorrow. Pulmonary following and appreciate input and recommendations.  2.  Acute on chronic respiratory failure with hypoxia Felt secondary to right middle lobe lung mass and hemoptysis.  Patient noted to have increased O2 requirements.  Continue nebulizer treatments, follow.  3.  Pyuria versus UTI Urine  cultures with 20,000 colonies of multiple species present.  Status post 3 days IV Rocephin.  Antibiotics discontinued.    4.  Atrial fibrillation Continue metoprolol for rate control.  INR currently subtherapeutic.  Coumadin on hold in anticipation of bronchoscopy tommorrow.  We will not bridge anticoagulation.    5.  History of remote PE on Coumadin INR now subtherapeutic at 1.57.  Anticoagulation on hold in anticipation of bronchoscopy tomorrow.  Pulmonary to advise when anticoagulation may be resumed.  Follow.  6.  Diabetes mellitus type 2 Hemoglobin A1c was 6.5 on 04/05/2018.  CBG was 227 this morning.  Oral hypoglycemic agents on hold.  Continue sliding scale insulin.  7.  Hypertension Blood pressure improved.  Cozaar and Dyazide discontinued.  Continue Lopressor.  Gentle hydration.  Follow.      DVT prophylaxis: SCDs Code Status: DNR Family Communication: Updated patient. No family at bedside.   Disposition Plan: Likely home once pulmonary evaluation is done and when okay with pulmonary.    Consultants:   Pulmonary: Dr. Vaughan Browner 07/29/2018  Procedures:   CT angiogram chest 07/28/2018  Chest x-ray 07/28/2018  Antimicrobials:   IV Rocephin 07/28/2018>>>>> 07/30/2018   Subjective: Patient laying in bed.  Denies chest pain no shortness of breath.  Still with some streaks of blood when she coughs.  No lightheadedness.  No dizziness.   Objective: Vitals:   07/31/18 2130 08/01/18 0538 08/01/18 0600 08/01/18 0839  BP:  (!) 125/57    Pulse:  (!) 102 88   Resp:  13  Temp:  98.4 F (36.9 C)    TempSrc:  Oral    SpO2: 95% 99%  100%  Weight:      Height:        Intake/Output Summary (Last 24 hours) at 08/01/2018 1026 Last data filed at 08/01/2018 0853 Gross per 24 hour  Intake 307.51 ml  Output -  Net 307.51 ml   Filed Weights   07/31/18 0746  Weight: 61.3 kg    Examination:  General exam: NAD Respiratory system: Lungs clear to auscultation bilaterally.  No  wheezes, no crackles, no rhonchi.  Speaking in full sentences.  Normal respiratory effort.  Cardiovascular system: Regular rate and rhythm no murmurs rubs or gallops.  No lower extremity edema.  No JVD. Gastrointestinal system: Abdomen is soft nondistended, nontender, soft, positive bowel sounds.  No rebound.  No guarding.  Central nervous system: Alert and oriented. No focal neurological deficits. Extremities: Symmetric 5 x 5 power. Skin: No rashes, lesions or ulcers Psychiatry: Judgement and insight appear normal. Mood & affect appropriate.     Data Reviewed: I have personally reviewed following labs and imaging studies  CBC: Recent Labs  Lab 07/28/18 1503 07/29/18 0925 07/30/18 0558 07/31/18 0639 08/01/18 0835  WBC 12.6* 14.3* 12.3* 12.2* 13.1*  NEUTROABS 10.2* 11.6* 9.7* 9.7*  --   HGB 10.3* 8.8* 8.8* 7.9* 8.7*  HCT 31.4* 26.1* 26.4* 23.7* 26.5*  MCV 76.6* 75.7* 74.2* 75.0* 75.5*  PLT 347 355 402* 362 761   Basic Metabolic Panel: Recent Labs  Lab 07/28/18 1503 07/29/18 0925 07/30/18 0558 07/31/18 0639 08/01/18 0835  NA 138 137 133* 137 137  K 4.8 4.1 4.4 4.0 3.8  CL 95* 95* 91* 98 98  CO2 29 30 30 27 27   GLUCOSE 83 177* 198* 175* 233*  BUN 35* 40* 50* 36* 21  CREATININE 1.33* 1.39* 1.62* 1.27* 1.06*  CALCIUM 8.9 8.9 9.0 8.3* 8.8*  MG  --  2.1  --   --   --    GFR: Estimated Creatinine Clearance: 35.6 mL/min (A) (by C-G formula based on SCr of 1.06 mg/dL (H)). Liver Function Tests: Recent Labs  Lab 07/28/18 1503 07/29/18 0925  AST 35 27  ALT 33 31  ALKPHOS 132* 131*  BILITOT 0.6 0.5  PROT 8.4* 7.5  ALBUMIN 3.2* 2.6*   No results for input(s): LIPASE, AMYLASE in the last 168 hours. No results for input(s): AMMONIA in the last 168 hours. Coagulation Profile: Recent Labs  Lab 07/28/18 1513 07/29/18 0925 07/30/18 0558 07/31/18 0639 08/01/18 0835  INR 3.97 3.90 2.65 2.16 1.57   Cardiac Enzymes: No results for input(s): CKTOTAL, CKMB, CKMBINDEX,  TROPONINI in the last 168 hours. BNP (last 3 results) No results for input(s): PROBNP in the last 8760 hours. HbA1C: No results for input(s): HGBA1C in the last 72 hours. CBG: Recent Labs  Lab 07/31/18 0755 07/31/18 1202 07/31/18 1641 07/31/18 2008 08/01/18 0748  GLUCAP 163* 259* 198* 188* 227*   Lipid Profile: No results for input(s): CHOL, HDL, LDLCALC, TRIG, CHOLHDL, LDLDIRECT in the last 72 hours. Thyroid Function Tests: No results for input(s): TSH, T4TOTAL, FREET4, T3FREE, THYROIDAB in the last 72 hours. Anemia Panel: No results for input(s): VITAMINB12, FOLATE, FERRITIN, TIBC, IRON, RETICCTPCT in the last 72 hours. Sepsis Labs: No results for input(s): PROCALCITON, LATICACIDVEN in the last 168 hours.  Recent Results (from the past 240 hour(s))  Culture, Urine     Status: Abnormal   Collection Time: 07/28/18  5:04 PM  Result Value Ref Range Status   Specimen Description URINE, RANDOM  Final   Special Requests   Final    NONE Performed at [facility] [address] [place], [place] [address]    Culture (A)  Final    20,000 COLONIES/mL MULTIPLE SPECIES PRESENT, SUGGEST RECOLLECTION   Report Status 07/30/2018 FINAL  Final         Radiology Studies: No results found.      Scheduled Meds: . adapalene  1 application Topical Daily  . budesonide (PULMICORT) nebulizer solution  0.5 mg Nebulization BID  . cholecalciferol  1,000 Units Oral Daily  . dextromethorphan-guaiFENesin  1 tablet Oral BID  . feeding supplement (ENSURE ENLIVE)  237 mL Oral BID BM  . insulin aspart  0-15 Units Subcutaneous TID WC  . ipratropium-albuterol  3 mL Nebulization BID  . metoprolol tartrate  100 mg Oral BID  . multivitamin with minerals  1 tablet Oral Daily  . omega-3 acid ethyl esters  2 g Oral BID  . pantoprazole  40 mg Oral Daily  . pravastatin  20 mg Oral q1800  . Tazarotene   Apply externally Daily   Continuous Infusions: . sodium chloride Stopped  (07/31/18 1054)     LOS: 4 days    Time spent: 26 mins    Irine Seal, MD [facility]  If 7PM-7AM, please contact night-coverage [email or web address] 08/01/2018, 10:26 AM

## 2018-08-02 ENCOUNTER — Encounter (HOSPITAL_COMMUNITY)
Admission: EM | Disposition: A | Discharge status: Home / Self Care | Payer: Self-pay | Source: Home / Self Care | Attending: Internal Medicine

## 2018-08-02 ENCOUNTER — Inpatient Hospital Stay (HOSPITAL_COMMUNITY): Payer: Medicare Other

## 2018-08-02 ENCOUNTER — Inpatient Hospital Stay (HOSPITAL_COMMUNITY): Payer: Medicare Other | Admitting: Anesthesiology

## 2018-08-02 ENCOUNTER — Encounter (HOSPITAL_COMMUNITY): Payer: Self-pay | Admitting: *Deleted

## 2018-08-02 DIAGNOSIS — R911 Solitary pulmonary nodule: Secondary | ICD-10-CM

## 2018-08-02 HISTORY — PX: VIDEO BRONCHOSCOPY WITH ENDOBRONCHIAL NAVIGATION: SHX6175

## 2018-08-02 LAB — BASIC METABOLIC PANEL
Anion gap: 12 (ref 5–15)
BUN: 22 mg/dL (ref 8–23)
CO2: 30 mmol/L (ref 22–32)
Calcium: 8.7 mg/dL — ABNORMAL LOW (ref 8.9–10.3)
Chloride: 98 mmol/L (ref 98–111)
Creatinine, Ser: 0.96 mg/dL (ref 0.44–1.00)
GFR calc Af Amer: >60 mL/min (ref >60)
GFR calc non Af Amer: 54 mL/min — ABNORMAL LOW (ref >60)
Glucose, Bld: 185 mg/dL — ABNORMAL HIGH (ref 70–99)
POTASSIUM: 4 mmol/L (ref 3.5–5.1)
Sodium: 140 mmol/L (ref 135–145)

## 2018-08-02 LAB — PROTIME-INR
INR: 1.32
Prothrombin Time: 16.3 seconds — ABNORMAL HIGH (ref 11.4–15.2)

## 2018-08-02 LAB — CBC
HCT: 25.2 % — ABNORMAL LOW (ref 36.0–46.0)
Hemoglobin: 8.4 g/dL — ABNORMAL LOW (ref 12.0–15.0)
MCH: 25.1 pg — ABNORMAL LOW (ref 26.0–34.0)
MCHC: 33.3 g/dL (ref 30.0–36.0)
MCV: 75.2 fL — ABNORMAL LOW (ref 80.0–100.0)
Platelets: 341 10*3/uL (ref 150–400)
RBC: 3.35 MIL/uL — AB (ref 3.87–5.11)
RDW: 14.7 % (ref 11.5–15.5)
WBC: 12.9 10*3/uL — AB (ref 4.0–10.5)
nRBC: 0 % (ref 0.0–0.2)

## 2018-08-02 LAB — GLUCOSE, CAPILLARY
GLUCOSE-CAPILLARY: 190 mg/dL — AB (ref 70–99)
GLUCOSE-CAPILLARY: 194 mg/dL — AB (ref 70–99)
Glucose-Capillary: 171 mg/dL — ABNORMAL HIGH (ref 70–99)
Glucose-Capillary: 162 mg/dL — ABNORMAL HIGH (ref 70–99)
Glucose-Capillary: 158 mg/dL — ABNORMAL HIGH (ref 70–99)
Glucose-Capillary: 155 mg/dL — ABNORMAL HIGH (ref 70–99)

## 2018-08-02 SURGERY — VIDEO BRONCHOSCOPY WITH ENDOBRONCHIAL NAVIGATION
Anesthesia: General | Site: Chest

## 2018-08-02 MED ORDER — PROPOFOL 10 MG/ML IV BOLUS
INTRAVENOUS | Status: AC
  Filled 2018-08-02: qty 20

## 2018-08-02 MED ORDER — ONDANSETRON HCL 4 MG/2ML IJ SOLN
INTRAMUSCULAR | Status: AC
  Filled 2018-08-02: qty 2

## 2018-08-02 MED ORDER — LACTATED RINGERS IV SOLN
INTRAVENOUS | Status: DC
  Administered 2018-08-02: 14:00:00 via INTRAVENOUS

## 2018-08-02 MED ORDER — ACETAMINOPHEN 500 MG PO TABS
ORAL_TABLET | ORAL | Status: AC
  Filled 2018-08-02: qty 2

## 2018-08-02 MED ORDER — LIDOCAINE 2% (20 MG/ML) 5 ML SYRINGE
INTRAMUSCULAR | Status: DC | PRN
  Administered 2018-08-02: 50 mg via INTRAVENOUS

## 2018-08-02 MED ORDER — SUGAMMADEX SODIUM 200 MG/2ML IV SOLN
INTRAVENOUS | Status: DC | PRN
  Administered 2018-08-02: 100 mg via INTRAVENOUS

## 2018-08-02 MED ORDER — 0.9 % SODIUM CHLORIDE (POUR BTL) OPTIME
TOPICAL | Status: DC | PRN
  Administered 2018-08-02: 1000 mL

## 2018-08-02 MED ORDER — FENTANYL CITRATE (PF) 100 MCG/2ML IJ SOLN
INTRAMUSCULAR | Status: DC | PRN
  Administered 2018-08-02: 50 ug via INTRAVENOUS

## 2018-08-02 MED ORDER — METOPROLOL TARTRATE 50 MG PO TABS
ORAL_TABLET | ORAL | Status: AC
  Administered 2018-08-02: 100 mg via ORAL
  Filled 2018-08-02: qty 2

## 2018-08-02 MED ORDER — PROPOFOL 10 MG/ML IV BOLUS
INTRAVENOUS | Status: DC | PRN
  Administered 2018-08-02: 90 mg via INTRAVENOUS

## 2018-08-02 MED ORDER — FENTANYL CITRATE (PF) 250 MCG/5ML IJ SOLN
INTRAMUSCULAR | Status: AC
  Filled 2018-08-02: qty 5

## 2018-08-02 MED ORDER — ACETAMINOPHEN 500 MG PO TABS
1000.0000 mg | ORAL_TABLET | Freq: Once | ORAL | Status: AC
  Administered 2018-08-02: 1000 mg via ORAL
  Filled 2018-08-02: qty 2

## 2018-08-02 MED ORDER — SODIUM CHLORIDE 0.9 % IV SOLN
INTRAVENOUS | Status: DC | PRN
  Administered 2018-08-02: 25 ug/min via INTRAVENOUS

## 2018-08-02 MED ORDER — ONDANSETRON HCL 4 MG/2ML IJ SOLN
INTRAMUSCULAR | Status: DC | PRN
  Administered 2018-08-02: 4 mg via INTRAVENOUS

## 2018-08-02 MED ORDER — DEXAMETHASONE SODIUM PHOSPHATE 10 MG/ML IJ SOLN
INTRAMUSCULAR | Status: DC | PRN
  Administered 2018-08-02: 10 mg via INTRAVENOUS

## 2018-08-02 MED ORDER — LIDOCAINE 2% (20 MG/ML) 5 ML SYRINGE
INTRAMUSCULAR | Status: AC
  Filled 2018-08-02: qty 5

## 2018-08-02 MED ORDER — ROCURONIUM BROMIDE 10 MG/ML (PF) SYRINGE
PREFILLED_SYRINGE | INTRAVENOUS | Status: DC | PRN
  Administered 2018-08-02: 50 mg via INTRAVENOUS

## 2018-08-02 SURGICAL SUPPLY — 54 items
ADAPTER BRONCHOSCOPE OLYMPUS (ADAPTER) ×3 IMPLANT
ADAPTER VALVE BIOPSY EBUS (MISCELLANEOUS) IMPLANT
ADPR BSCP OLMPS EDG (ADAPTER) ×2
ADPTR VALVE BIOPSY EBUS (MISCELLANEOUS)
BALLN FOR EBUS SCOPE (BALLOONS) ×3
BALLOON FOR EBUS SCOPE (BALLOONS) IMPLANT
BALN ESCP STRL LXBF-UC160F (BALLOONS) ×2
BRUSH BIOPSY BRONCH 10 SDTNB (MISCELLANEOUS) ×1 IMPLANT
BRUSH CYTOL CELLEBRITY 1.5X140 (MISCELLANEOUS) ×3 IMPLANT
BRUSH SUPERTRAX BIOPSY (INSTRUMENTS) ×1 IMPLANT
BRUSH SUPERTRAX NDL-TIP CYTO (INSTRUMENTS) ×3 IMPLANT
CANISTER SUCT 3000ML PPV (MISCELLANEOUS) ×3 IMPLANT
CHANNEL WORK EXTEND EDGE 180 (KITS) IMPLANT
CHANNEL WORK EXTEND EDGE 90 (KITS) IMPLANT
CONT SPEC 4OZ CLIKSEAL STRL BL (MISCELLANEOUS) ×3 IMPLANT
COVER BACK TABLE 60X90IN (DRAPES) ×3 IMPLANT
COVER WAND RF STERILE (DRAPES) ×3 IMPLANT
FILTER STRAW FLUID ASPIR (MISCELLANEOUS) IMPLANT
FORCEPS BIOP RJ4 1.8 (CUTTING FORCEPS) IMPLANT
FORCEPS BIOP SUPERTRX PREMAR (INSTRUMENTS) ×3 IMPLANT
GAUZE SPONGE 4X4 12PLY STRL (GAUZE/BANDAGES/DRESSINGS) ×3 IMPLANT
GLOVE BIO SURGEON STRL SZ 6.5 (GLOVE) ×6 IMPLANT
GOWN STRL REUS W/ TWL LRG LVL3 (GOWN DISPOSABLE) ×4 IMPLANT
GOWN STRL REUS W/TWL LRG LVL3 (GOWN DISPOSABLE) ×6
KIT CLEAN ENDO COMPLIANCE (KITS) ×6 IMPLANT
KIT LOCATABLE GUIDE (CANNULA) IMPLANT
KIT MARKER FIDUCIAL DELIVERY (KITS) IMPLANT
KIT PROCEDURE EDGE 180 (KITS) ×1 IMPLANT
KIT PROCEDURE EDGE 90 (KITS) IMPLANT
KIT TURNOVER KIT B (KITS) ×3 IMPLANT
MARKER SKIN DUAL TIP RULER LAB (MISCELLANEOUS) ×3 IMPLANT
NDL ASPIRATION VIZISHOT 19G (NEEDLE) IMPLANT
NDL ASPIRATION VIZISHOT 21G (NEEDLE) ×2 IMPLANT
NDL SUPERTRX PREMARK BIOPSY (NEEDLE) ×2 IMPLANT
NEEDLE ASPIRATION VIZISHOT 19G (NEEDLE) ×6 IMPLANT
NEEDLE ASPIRATION VIZISHOT 21G (NEEDLE) ×3 IMPLANT
NEEDLE SUPERTRX PREMARK BIOPSY (NEEDLE) ×3 IMPLANT
NS IRRIG 1000ML POUR BTL (IV SOLUTION) ×3 IMPLANT
OIL SILICONE PENTAX (PARTS (SERVICE/REPAIRS)) ×3 IMPLANT
PAD ARMBOARD 7.5X6 YLW CONV (MISCELLANEOUS) ×6 IMPLANT
PATCHES PATIENT (LABEL) ×9 IMPLANT
STOPCOCK 4 WAY LG BORE MALE ST (IV SETS) ×1 IMPLANT
SYR 20CC LL (SYRINGE) ×6 IMPLANT
SYR 20ML ECCENTRIC (SYRINGE) ×6 IMPLANT
SYR 50ML SLIP (SYRINGE) ×3 IMPLANT
SYR 5ML LUER SLIP (SYRINGE) ×3 IMPLANT
TOWEL OR 17X24 6PK STRL BLUE (TOWEL DISPOSABLE) ×3 IMPLANT
TRAP SPECIMEN MUCOUS 40CC (MISCELLANEOUS) IMPLANT
TUBE CONNECTING 20X1/4 (TUBING) ×6 IMPLANT
UNDERPAD 30X30 (UNDERPADS AND DIAPERS) ×3 IMPLANT
VALVE BIOPSY  SINGLE USE (MISCELLANEOUS) ×1
VALVE BIOPSY SINGLE USE (MISCELLANEOUS) ×2 IMPLANT
VALVE SUCTION BRONCHIO DISP (MISCELLANEOUS) ×3 IMPLANT
WATER STERILE IRR 1000ML POUR (IV SOLUTION) ×3 IMPLANT

## 2018-08-02 NOTE — Progress Notes (Signed)
Care link transfer the pt to [facility] Family members saw pt before leave.

## 2018-08-02 NOTE — Anesthesia Postprocedure Evaluation (Signed)
Anesthesia Post Note  Patient: Alejandra [name]  Procedure(s) Performed: VIDEO BRONCHOSCOPY WITH ENDOBRONCHIAL NAVIGATION (N/A Chest) VIDEO BRONCHOSCOPY WITH RADIAL ENDOBRONCHIAL ULTRASOUND (N/A Chest)     Patient location during evaluation: PACU Anesthesia Type: MAC Level of consciousness: awake and alert Pain management: pain level controlled Vital Signs Assessment: post-procedure vital signs reviewed and stable Respiratory status: spontaneous breathing, nonlabored ventilation, respiratory function stable and patient connected to nasal cannula oxygen Cardiovascular status: stable and blood pressure returned to baseline Postop Assessment: no apparent nausea or vomiting Anesthetic complications: no    Last Vitals:  Vitals:   08/02/18 2002 08/02/18 2038  BP: 110/80 121/71  Pulse: 75 90  Resp: (!) 28 (!) 24  Temp: (!) 36.4 C 37.2 C  SpO2: 97% 98%    Last Pain:  Vitals:   08/02/18 2038  TempSrc: Oral  PainSc:                  [name]

## 2018-08-02 NOTE — H&P (Signed)
PRE-PROCEDURE BRONCHOSCOPY H&P NAME:  Alejandra [name], MRN:  [id number], DOB:  November 14, 1931, LOS: 5 ADMISSION DATE:  07/28/2018, PROCEDURE DATE:  08/02/18  History of present illness   85 year old with history of COPD, emphysema on 2 lt home O2, ex-smoker admitted 1/10 with several weeks of intermittent hemoptysis, red mucus. She had a chest x-ray at primary care which showed right lung mass. CTA with enlarging right middle lobe mass. Pulmonary consulted.  Patient scheduled for diagnostic bronchoscopy for right middle lobe mass  Past Medical History  CKD, hypertension, diabetes, AAA, COPD, emphysema She has been on Coumadin for unprovoked PE in July 2011  Objective   Blood pressure 139/71, pulse (!) 113, temperature (!) 100.6 F (38.1 C), temperature source Oral, resp. rate 18, height 5\' 5"  (1.651 m), weight 61 kg, SpO2 100 %.        Intake/Output Summary (Last 24 hours) at 08/02/2018 1553 Last data filed at 08/02/2018 0600 Gross per 24 hour  Intake 575 ml  Output -  Net 575 ml   Filed Weights   07/31/18 0746 08/02/18 1308  Weight: 61.3 kg 61 kg    Physical Exam: General: Well-appearing, no acute distress HENT: [place], AT, OP clear, MMM Eyes: EOMI, no scleral icterus Respiratory: Clear to auscultation bilaterally.  No crackles, wheezing or rales Cardiovascular: RRR, -M/R/G, no JVD GI: BS+, soft, nontender Extremities:-Edema,-tenderness Neuro: AAO x4, CNII-XII grossly intact Skin: Intact, no rashes or bruising Psych: Normal mood, normal affect  CTA Chest PE W/WO CONTRAST 07/28/18 FINDINGS: Cardiovascular: Left subclavian dual lead transvenous pacemaker, leads extending into the right atrium and towards the right ventricular apex. Heart size normal. No pericardial effusion. Satisfactory opacification of pulmonary arteries noted, and there is no evidence of pulmonary emboli. Coronary calcifications. There is good contrast opacification of the thoracic aorta. There is eccentric  aneurysmal dilatation of the distal arch up to 3.8 cm diameter, aorta returning to normal caliber of 3 cm in the proximal descending segment. Moderate scattered calcified atheromatous plaque in the arch and descending thoracic segment. There is some eccentric nonocclusive mural thrombus in the visualized proximal abdominal aorta. Tines of infrarenal stent graft are partially visualized.  Classic 3 vessel brachiocephalic arterial origin anatomy without proximal stenosis.  Mediastinum/Nodes: No hilar or mediastinal adenopathy.  Lungs/Pleura: No pleural effusion. No pneumothorax. Pulmonary emphysema (ICD10-J43.9). 4 cm spiculated mass in the right middle lobe, previously 1 cm. 2 subpleural satellite nodules measuring 6 and 10 mm are relatively stable. Smaller nonspecific nodules in the posterior upper lobes are stable.  Upper Abdomen: Cholecystectomy clips. Stable bilateral adrenal fullness. No acute findings.  Musculoskeletal: Old healed sternal fracture. No acute fracture or worrisome bone lesion.  Review of the MIP images confirms the above findings.  IMPRESSION: 1. Negative for acute PE or thoracic aortic dissection. 2. Enlarging 4 cm right middle lobe mass suggesting primary bronchogenic carcinoma. Consider pulmonary/thoracic consultation. 3. Coronary and Aortic Atherosclerosis (ICD10-170.0). 4. 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 Recommend annual imaging followup by CTA or MRA. This recommendation follows 2010 ACCF/AHA/AATS/ACR/ASA/SCA/SCAI/SIR/STS/SVM Guidelines for the Diagnosis and Management of Patients with Thoracic Aortic Disease. Circulation.2010; 121: E266-e369   Assessment & Plan:  Right middle lobe mass Hemoptysis  --Plan for navigational bronchoscopy with endobronchial ultrasound with sampling via brushings/transbronchial needle aspirations/biopsies/washings --Consent obtained at bedsie  Labs   CBC: Recent Labs  Lab 07/28/18 1503  07/29/18 0925 07/30/18 0558 07/31/18 0639 08/01/18 0835 08/02/18 0604  WBC 12.6* 14.3* 12.3* 12.2* 13.1* 12.9*  NEUTROABS 10.2*  11.6* 9.7* 9.7*  --   --   HGB 10.3* 8.8* 8.8* 7.9* 8.7* 8.4*  HCT 31.4* 26.1* 26.4* 23.7* 26.5* 25.2*  MCV 76.6* 75.7* 74.2* 75.0* 75.5* 75.2*  PLT 347 355 402* 362 367 932    Basic Metabolic Panel: Recent Labs  Lab 07/29/18 0925 07/30/18 0558 07/31/18 0639 08/01/18 0835 08/02/18 0604  NA 137 133* 137 137 140  K 4.1 4.4 4.0 3.8 4.0  CL 95* 91* 98 98 98  CO2 30 30 27 27 30   GLUCOSE 177* 198* 175* 233* 185*  BUN 40* 50* 36* 21 22  CREATININE 1.39* 1.62* 1.27* 1.06* 0.96  CALCIUM 8.9 9.0 8.3* 8.8* 8.7*  MG 2.1  --   --   --   --    GFR: Estimated Creatinine Clearance: 39.3 mL/min (by C-G formula based on SCr of 0.96 mg/dL). Recent Labs  Lab 07/30/18 0558 07/31/18 0639 08/01/18 0835 08/02/18 0604  WBC 12.3* 12.2* 13.1* 12.9*    Liver Function Tests: Recent Labs  Lab 07/28/18 1503 07/29/18 0925  AST 35 27  ALT 33 31  ALKPHOS 132* 131*  BILITOT 0.6 0.5  PROT 8.4* 7.5  ALBUMIN 3.2* 2.6*   No results for input(s): LIPASE, AMYLASE in the last 168 hours. No results for input(s): AMMONIA in the last 168 hours.  ABG    Component Value Date/Time   TCO2 28 02/02/2017 1843     Coagulation Profile: Recent Labs  Lab 07/29/18 0925 07/30/18 0558 07/31/18 0639 08/01/18 0835 08/02/18 0604  INR 3.90 2.65 2.16 1.57 1.32    Cardiac Enzymes: No results for input(s): CKTOTAL, CKMB, CKMBINDEX, TROPONINI in the last 168 hours.  HbA1C: Hgb A1c MFr Bld  Date/Time Value Ref Range Status  04/05/2018 11:00 AM 6.5 4.6 - 6.5 % Final    Comment:    Glycemic Control Guidelines for People with Diabetes:Non Diabetic:  <6%Goal of Therapy: <7%Additional Action Suggested:  >8%   09/28/2017 11:29 AM 6.1 4.6 - 6.5 % Final    Comment:    Glycemic Control Guidelines for People with Diabetes:Non Diabetic:  <6%Goal of Therapy: <7%Additional Action  Suggested:  >8%     CBG: Recent Labs  Lab 08/01/18 2025 08/02/18 0744 08/02/18 1139 08/02/18 1309 08/02/18 1536  GLUCAP 262* 171* 190* 162* 158*    Review of Systems:   Review of Systems  Constitutional: Positive for weight loss. Negative for chills, diaphoresis, fever and malaise/fatigue.  HENT: Negative for congestion and sore throat.   Respiratory: Positive for cough, hemoptysis, sputum production and shortness of breath. Negative for wheezing.   Cardiovascular: Negative for chest pain, orthopnea, leg swelling and PND.  Gastrointestinal: Negative for abdominal pain, heartburn and nausea.  Genitourinary: Negative for frequency.  Musculoskeletal: Negative for myalgias.  Skin: Negative for rash.  Neurological: Positive for weakness. Negative for dizziness and headaches.  Endo/Heme/Allergies: Does not bruise/bleed easily.   Past Medical History  She,  has a past medical history of AAA (abdominal aortic aneurysm) ([place]), Arthritis, COPD (chronic obstructive pulmonary disease) ([place]), DM (diabetes mellitus) ([place]), Dyslipidemia, Hyperlipidemia, Hypertension, Left foot infection (2013), O2 dependent (Nov. 2013), and Pulmonary embolism ([place]).   Surgical History    Past Surgical History:  Procedure Laterality Date  . ABDOMINAL AORTIC ANEURYSM REPAIR  10-30-2011  . CHOLECYSTECTOMY N/A 10/14/2016   Procedure: LAPAROSCOPIC CHOLECYSTECTOMY;  Surgeon: Clovis Riley, MD;  Location: [facility];  Service: General;  Laterality: N/A;  . ENDOVASCULAR STENT INSERTION  10/30/2011   Procedure: ENDOVASCULAR STENT  GRAFT INSERTION;  Surgeon: Serafina Mitchell, MD;  Location: [facility];  Service: Vascular;  Laterality: N/A;  . PACEMAKER IMPLANT N/A 02/04/2017   Procedure: Pacemaker Implant;  Surgeon: Constance Haw, MD;  Location: [facility];  Service: Cardiovascular;  Laterality: N/A;  . VESICOVAGINAL FISTULA CLOSURE W/ TAH       Social History   reports that she quit smoking about 8 years ago.  Her smoking use included cigarettes. She has a 27.00 pack-year smoking history. She has never used smokeless tobacco. She reports that she does not drink alcohol or use drugs.   Family History   Her family history includes Clotting disorder in her daughter and son; Diabetes in her father and son; Heart attack in her father, mother, and sister; Heart disease in her mother and sister; Hypertension in her brother, father, mother, and sister.   Allergies Allergies  Allergen Reactions  . Penicillins Nausea And Vomiting, Rash and Other (See Comments)    Childhood reaction, tolerated multiple cephs in 2013. Has patient had a PCN reaction causing immediate rash, facial/tongue/throat swelling, SOB or lightheadedness with hypotension: Yes Has patient had a PCN reaction causing severe rash involving mucus membranes or skin necrosis: Yes Has patient had a PCN reaction that required hospitalization: Yes Has patient had a PCN reaction occurring within the last 10 years: No      Home Medications  Prior to Admission medications   Medication Sig Start Date End Date Taking? Authorizing Provider  acetaminophen (TYLENOL) 500 MG tablet Take 1,000 mg by mouth 2 (two) times daily as needed for mild pain.   Yes [provider]  adapalene (DIFFERIN) 0.1 % gel APPLY TO SCALP PSORIASIS ONCE DAILY Patient taking differently: Apply 1 application topically daily.  05/25/18  Yes Vivi Barrack, MD  albuterol (PROVENTIL HFA;VENTOLIN HFA) 108 (90 Base) MCG/ACT inhaler Inhale 2 puffs into the lungs every 6 (six) hours as needed for wheezing or shortness of breath. 11/23/17  Yes Vivi Barrack, MD  aspirin 81 MG tablet Take 81 mg by mouth daily.     Yes [provider]  budesonide-formoterol (SYMBICORT) 80-4.5 MCG/ACT inhaler Inhale 2 puffs into the lungs 2 (two) times daily. 11/23/17  Yes Vivi Barrack, MD  Cholecalciferol (VITAMIN D3) 1000 UNITS CAPS Take 1 capsule by mouth daily.     Yes [provider]  dextromethorphan-guaiFENesin (MUCINEX DM) 30-600 MG 12hr tablet Take 1 tablet by mouth 2 (two) times daily. 04/08/18  Yes Barton Dubois, MD  gabapentin (NEURONTIN) 100 MG capsule Start with 1 tab po qhs X 1 week, then increase to 1 tab po bid X 1 week then 1 tab po tid prn Patient taking differently: Take 100 mg by mouth 3 (three) times daily as needed (for pain).  11/15/17  Yes Gerda Diss, DO  Garlic 7673 MG CAPS Take 1,000 mg by mouth daily.    Yes [provider]  glipiZIDE (GLUCOTROL) 5 MG tablet TAKE 1 TABLET(5 MG) BY MOUTH TWICE DAILY BEFORE A MEAL Patient taking differently: Take 5 mg by mouth 2 (two) times daily before a meal.  07/25/18  Yes Vivi Barrack, MD  losartan (COZAAR) 25 MG tablet Take 1 tablet (25 mg total) by mouth daily. 04/28/18  Yes Vivi Barrack, MD  lovastatin (MEVACOR) 20 MG tablet Take 1 tablet (20 mg total) by mouth at bedtime. 09/28/17  Yes Vivi Barrack, MD  metFORMIN (GLUCOPHAGE) 500 MG tablet TAKE 1 TABLET(500 MG)  BY MOUTH TWICE DAILY WITH A MEAL Patient taking differently: Take 500 mg by mouth 2 (two) times daily with a meal.  07/25/18  Yes Vivi Barrack, MD  Multiple Vitamin (MULTIVITAMIN) tablet Take 1 tablet by mouth daily.     Yes [provider]  omega-3 acid ethyl esters (LOVAZA) 1 G capsule Take 2 g by mouth 2 (two) times daily.   Yes [provider]  pantoprazole (PROTONIX) 40 MG tablet Take 1 tablet (40 mg total) by mouth daily. 04/09/18  Yes Barton Dubois, MD  Tazarotene 0.1 % FOAM Apply to scalp psoriasis 1 time daily. Patient taking differently: Apply topically daily. Apply to scalp psoriasis 01/04/18  Yes Vivi Barrack, MD  Tiotropium Bromide Monohydrate (SPIRIVA RESPIMAT) 2.5 MCG/ACT AERS Inhale 2 puffs into the lungs daily. 04/05/18  Yes Vivi Barrack, MD  triamcinolone ointment (KENALOG) 0.5 % APPLY TOPICALLY TO THE AFFECTED AREA TWICE DAILY Patient taking differently: Apply 1 application topically  2 (two) times daily.  05/25/18  Yes Vivi Barrack, MD  triamterene-hydrochlorothiazide (DYAZIDE) 37.5-25 MG capsule Take 1 each (1 capsule total) by mouth daily. 02/16/18  Yes Vivi Barrack, MD  warfarin (COUMADIN) 5 MG tablet Take 1-1.5 tablets (5-7.5 mg total) by mouth See admin instructions. Take 1 and 1/2 tablets on Monday then take 1 tablet the other days Patient taking differently: Take 5-7.5 mg by mouth See admin instructions. Take 7.5 mg by mouth on Monday then take 5 mg by mouth on the other days 05/01/18  Yes Vivi Barrack, MD  metoprolol tartrate (LOPRESSOR) 100 MG tablet Take 1 tablet (100 mg total) by mouth 2 (two) times daily. 02/08/18 05/09/18  Constance Haw, MD

## 2018-08-02 NOTE — Progress Notes (Signed)
PROGRESS NOTE    Alejandra [name]  IOE:[id number] DOB: [birth year] DOA: 07/28/2018 PCP: Vivi Barrack, MD   Brief Narrative:  Per Dr. Soyla Dryer [name] a 85 y.o.femalewith medical history significant ofCOPD on 1-2LO2at baseline, DM2, HTN, prior PE.  Patient presents to the ED with 2-3 week history of hemoptysis. Cough perisistent, hemoptysis intermittent. Wt loss, generalized weakness, and poor appetite. No fever nor chills. No N/V/D.  PCPs office CXR showed lung mass, sent in for further work up.   ED Course:CT scan shows 4cmRML lung mass, likely primary bronchogenic carcinoma.  UA shows UTI.   Assessment & Plan:   Principal Problem:   Mass of middle lobe of right lung Active Problems:   COPD (chronic obstructive pulmonary disease) ([name])   Hypertension associated with diabetes ([place])   Anticoagulated on Coumadin   Atrial fibrillation, chronic   Type 2 diabetes mellitus with stage 3 chronic kidney disease, without long-term current use of insulin ([name])   Acute lower UTI   Hemoptysis   Oxygen dependent   Acute on chronic respiratory failure with hypoxia ([name])   Malnutrition of moderate degree   1 right middle lobe lung mass Noted on chest x-ray done at PCPs office. CT concerning for enlarging 4 cm RML mass suggestive of primary bronchogenic carcinoma Pulm c/s, bronchoscopy 1/15, report pending Coumadin on hold, will f/u with pulm for timing for resumption.  .  2.  Acute on chronic respiratory failure with hypoxia Felt secondary to right middle lobe lung mass and hemoptysis.  Patient noted to have increased O2 requirements.  Continue nebulizer treatments, follow.  Wean O2 as tolerated.  Fever: 1/15, improved now.  If recurrent, follow blood cx and add abx, further w/u as appropriate.     3.  Pyuria versus UTI Urine cultures with 20,000 colonies of multiple species present.  Status post 3 days IV Rocephin.  Antibiotics discontinued.    4.   Atrial fibrillation Continue metoprolol for rate control.  INR currently subtherapeutic.  Coumadin on hold in anticipation of bronchoscopy tommorrow.  We will not bridge anticoagulation.    5.  History of remote PE on Coumadin INR now subtherapeutic at 1.57.  Anticoagulation on hold in anticipation of bronchoscopy tomorrow.  Pulmonary to advise when anticoagulation may be resumed.  Follow.  6.  Diabetes mellitus type 2 Hemoglobin A1c was 6.5 on 04/05/2018.  SSI  7.  Hypertension Blood pressure improved.  Cozaar and Dyazide discontinued.  Continue Lopressor.  Gentle hydration.  Follow.      DVT prophylaxis: SCD Code Status: DNR Family Communication: none at bedside Disposition Plan: pending further improvement   Consultants:   pulmonology  Procedures:   Bronch 1/15  Antimicrobials:  Anti-infectives (From admission, onward)   Start     Dose/Rate Route Frequency Ordered Stop   07/28/18 2000  cefTRIAXone (ROCEPHIN) 1 g in sodium chloride 0.9 % 100 mL IVPB  Status:  Discontinued     1 g 200 mL/hr over 30 Minutes Intravenous Every 24 hours 07/28/18 1920 07/31/18 0833   07/28/18 1915  ciprofloxacin (CIPRO) IVPB 400 mg  Status:  Discontinued     400 mg 200 mL/hr over 60 Minutes Intravenous  Once 07/28/18 1901 07/28/18 1918         Subjective: Denies complaints. See after bronch.  Objective: Vitals:   08/02/18 1945 08/02/18 2000 08/02/18 2002 08/02/18 2038  BP: (!) 110/57  110/80 121/71  Pulse: 60 77 75 90  Resp: (!) 29 (!) 26 Marland Kitchen)  28 (!) 24  Temp:   (!) 97.5 F (36.4 C) 98.9 F (37.2 C)  TempSrc:    Oral  SpO2: 97%  97% 98%  Weight:      Height:        Intake/Output Summary (Last 24 hours) at 08/02/2018 2143 Last data filed at 08/02/2018 1930 Gross per 24 hour  Intake 1055 ml  Output 202 ml  Net 853 ml   Filed Weights   07/31/18 0746 08/02/18 1308  Weight: 61.3 kg 61 kg    Examination:  General exam: Appears calm and comfortable, sitting up on edge  of bed coughign occasionally Respiratory system: Clear to auscultation. Respiratory effort normal. Cardiovascular system: S1 & S2 heard, RRR.  Gastrointestinal system: Abdomen is nondistended, soft and nontender. Central nervous system: Alert and oriented. No focal neurological deficits. Extremities: no lee Skin: No rashes, lesions or ulcers Psychiatry: Judgement and insight appear normal. Mood & affect appropriate.     Data Reviewed: I have personally reviewed following labs and imaging studies  CBC: Recent Labs  Lab 07/28/18 1503 07/29/18 0925 07/30/18 0558 07/31/18 0639 08/01/18 0835 08/02/18 0604  WBC 12.6* 14.3* 12.3* 12.2* 13.1* 12.9*  NEUTROABS 10.2* 11.6* 9.7* 9.7*  --   --   HGB 10.3* 8.8* 8.8* 7.9* 8.7* 8.4*  HCT 31.4* 26.1* 26.4* 23.7* 26.5* 25.2*  MCV 76.6* 75.7* 74.2* 75.0* 75.5* 75.2*  PLT 347 355 402* 362 367 510   Basic Metabolic Panel: Recent Labs  Lab 07/29/18 0925 07/30/18 0558 07/31/18 0639 08/01/18 0835 08/02/18 0604  NA 137 133* 137 137 140  K 4.1 4.4 4.0 3.8 4.0  CL 95* 91* 98 98 98  CO2 30 30 27 27 30   GLUCOSE 177* 198* 175* 233* 185*  BUN 40* 50* 36* 21 22  CREATININE 1.39* 1.62* 1.27* 1.06* 0.96  CALCIUM 8.9 9.0 8.3* 8.8* 8.7*  MG 2.1  --   --   --   --    GFR: Estimated Creatinine Clearance: 39.3 mL/min (by C-G formula based on SCr of 0.96 mg/dL). Liver Function Tests: Recent Labs  Lab 07/28/18 1503 07/29/18 0925  AST 35 27  ALT 33 31  ALKPHOS 132* 131*  BILITOT 0.6 0.5  PROT 8.4* 7.5  ALBUMIN 3.2* 2.6*   No results for input(s): LIPASE, AMYLASE in the last 168 hours. No results for input(s): AMMONIA in the last 168 hours. Coagulation Profile: Recent Labs  Lab 07/29/18 0925 07/30/18 0558 07/31/18 0639 08/01/18 0835 08/02/18 0604  INR 3.90 2.65 2.16 1.57 1.32   Cardiac Enzymes: No results for input(s): CKTOTAL, CKMB, CKMBINDEX, TROPONINI in the last 168 hours. BNP (last 3 results) No results for input(s): PROBNP in  the last 8760 hours. HbA1C: No results for input(s): HGBA1C in the last 72 hours. CBG: Recent Labs  Lab 08/02/18 1139 08/02/18 1309 08/02/18 1536 08/02/18 1912 08/02/18 2129  GLUCAP 190* 162* 158* 155* 194*   Lipid Profile: No results for input(s): CHOL, HDL, LDLCALC, TRIG, CHOLHDL, LDLDIRECT in the last 72 hours. Thyroid Function Tests: No results for input(s): TSH, T4TOTAL, FREET4, T3FREE, THYROIDAB in the last 72 hours. Anemia Panel: No results for input(s): VITAMINB12, FOLATE, FERRITIN, TIBC, IRON, RETICCTPCT in the last 72 hours. Sepsis Labs: No results for input(s): PROCALCITON, LATICACIDVEN in the last 168 hours.  Recent Results (from the past 240 hour(s))  Culture, Urine     Status: Abnormal   Collection Time: 07/28/18  5:04 PM  Result Value Ref Range Status   Specimen  Description URINE, RANDOM  Final   Special Requests   Final    NONE Performed at [facility] [address], [place], [place] [address]    Culture (A)  Final    20,000 COLONIES/mL MULTIPLE SPECIES PRESENT, SUGGEST RECOLLECTION   Report Status 07/30/2018 FINAL  Final         Radiology Studies: Dg C-arm Bronchoscopy  Result Date: 08/02/2018 C-ARM BRONCHOSCOPY: Fluoroscopy was utilized by the requesting physician.  No radiographic interpretation.        Scheduled Meds: . acetaminophen      . adapalene  1 application Topical Daily  . budesonide (PULMICORT) nebulizer solution  0.5 mg Nebulization BID  . cholecalciferol  1,000 Units Oral Daily  . dextromethorphan-guaiFENesin  1 tablet Oral BID  . feeding supplement (ENSURE ENLIVE)  237 mL Oral BID BM  . insulin aspart  0-15 Units Subcutaneous TID WC  . ipratropium-albuterol  3 mL Nebulization BID  . metoprolol tartrate  100 mg Oral BID  . multivitamin with minerals  1 tablet Oral Daily  . omega-3 acid ethyl esters  2 g Oral BID  . pantoprazole  40 mg Oral Daily  . pravastatin  20 mg Oral q1800  . Tazarotene   Apply  externally Daily   Continuous Infusions: . sodium chloride 50 mL/hr at 08/01/18 2054  . lactated ringers       LOS: 5 days    Time spent: over 67 in    Fayrene Helper, MD [facility] Pager see AMION  If 7PM-7AM, please contact night-coverage [email or web address] Password [facility] 08/02/2018, 9:43 PM

## 2018-08-02 NOTE — Anesthesia Procedure Notes (Signed)
Procedure Name: Intubation Date/Time: 08/02/2018 4:35 PM Performed by: Wilburn Cornelia, CRNA Pre-anesthesia Checklist: Patient identified, Emergency Drugs available, Suction available, Patient being monitored and Timeout performed Patient Re-evaluated:Patient Re-evaluated prior to induction Oxygen Delivery Method: Circle system utilized Preoxygenation: Pre-oxygenation with 100% oxygen Induction Type: IV induction Ventilation: Mask ventilation without difficulty Laryngoscope Size: Miller and 3 Grade View: Grade I Tube type: Oral Tube size: 8.5 mm Number of attempts: 1 Airway Equipment and Method: Stylet Placement Confirmation: ETT inserted through vocal cords under direct vision,  positive ETCO2,  CO2 detector and breath sounds checked- equal and bilateral Secured at: 21 cm Tube secured with: Tape Dental Injury: Teeth and Oropharynx as per pre-operative assessment

## 2018-08-02 NOTE — Care Plan (Signed)
Post-Bronchoscopy Visit  Evaluated patient post-procedure: Stable Updated patient and family.  Final results will take 3-5 business days.  Will arrange for outpatient Pulmonary follow-up (2/6 @2 :30PM with me) OK to discharge home from Pulmonary standpoint. If inpatient, will defer to primary team.   Rodman Pickle, M.D. [facility] Pulmonary/Critical Care Medicine Pager: [phone] After hours pager: [phone]

## 2018-08-02 NOTE — Op Note (Signed)
Video Bronchoscopy with Electromagnetic Navigation Procedure Note  Date of Operation: 08/02/2018  Pre-op Diagnosis: Right middle lobe lung mass, hemoptysis  Post-op Diagnosis: Same as above  Surgeon:Jane Loanne Drilling, MD  Assistants: Refer to RN op sheet  Anesthesia: General endotracheal anesthesia  Operation: Flexible video fiberoptic bronchoscopy with electromagnetic navigation and biopsies.  Estimated Blood Loss: Minimal  Complications: None  Indications and History: Alejandra [name] is a 85 y.o. female with emphysema and pulmonary nodules who presented with hemoptysis and enlarging right pulmonary mass.  The risks, benefits, complications, treatment options and expected outcomes were discussed with the patient.  The possibilities of pneumothorax, pneumonia, reaction to medication, pulmonary aspiration, perforation of a viscus, bleeding, failure to diagnose a condition and creating a complication requiring transfusion or operation were discussed with the patient who freely signed the consent.    Description of Procedure: The patient was seen in the Preoperative Area, was examined and was deemed appropriate to proceed.  The patient was taken to OR, identified as Alejandra [name] and the procedure verified as Flexible Video Fiberoptic Bronchoscopy.  A Time Out was held and the above information confirmed.   Prior to the date of the procedure a high-resolution CT scan of the chest was performed. Utilizing Pike a virtual tracheobronchial tree was generated to allow the creation of distinct navigation pathways to the patient's parenchymal abnormalities. After being taken to the operating room general anesthesia was initiated and the patient  was orally intubated. The video fiberoptic bronchoscope was introduced via the endotracheal tube and a general inspection was performed which showed normal anatomy and mucosa. Thick, white secretions presents that were easily suctioned and  cleared from airway. The extendable working channel and locator guide were introduced into the bronchoscope. The distinct navigation pathways prepared prior to this procedure were then utilized to navigate to the right middle lobe of patient's lesion identified on CT scan. The extendable working channel was secured into place and the locator guide was withdrawn. Under fluoroscopic guidance transbronchial needle brushings, transbronchial needle biopsies, and transbronchial forceps biopsies were performed to be sent for cytology and pathology. The endobronchial ultrasound was introduced and used to identify and characterize the hilar and bronchial lymph nodes. Inspection showed 4L (40mm), 7 ( 41mm) and 11R (58mm). Using real-time ultrasound guidance Wang needle biopsies were take from Station 4L, 7 and 11R nodes and were sent for cytology.  At the end of the procedure a general airway inspection was performed and there was no evidence of active bleeding. The bronchoscope was removed.  The patient tolerated the procedure well. There was no significant blood loss and there were no obvious complications. A post-procedural chest x-ray is pending.  Samples: 1. Transbronchial needle brushings from RML lung mass 2. Transbronchial needle biopsies from RML lung mass, 4L, 7 and 11R 3. Transbronchial forceps biopsies from RML lung mass   Plans:  The patient will be discharged from the PACU to home when recovered from anesthesia and after chest x-ray is reviewed. We will review the cytology, pathology and microbiology results with the patient when they become available. Outpatient followup will be with 2/6 with Dr. Loanne Drilling.   Rodman Pickle, M.D. [facility] Pulmonary/Critical Care Medicine Pager: [phone] After hours pager: [phone]

## 2018-08-02 NOTE — Anesthesia Preprocedure Evaluation (Addendum)
Anesthesia Evaluation  Patient identified by MRN, date of birth, ID band Patient awake    Reviewed: Allergy & Precautions, NPO status , Patient's Chart, lab work & pertinent test results  Airway Mallampati: III  TM Distance: >3 FB Neck ROM: Full    Dental  (+) Edentulous Upper, Dental Advisory Given   Pulmonary COPD,  oxygen dependent, former smoker,     + decreased breath sounds      Cardiovascular hypertension, + dysrhythmias Atrial Fibrillation  Rhythm:Regular Rate:Normal     Neuro/Psych    GI/Hepatic   Endo/Other  diabetes, Type 2  Renal/GU      Musculoskeletal   Abdominal   Peds  Hematology   Anesthesia Other Findings   Reproductive/Obstetrics                           Anesthesia Physical Anesthesia Plan  ASA: III  Anesthesia Plan: General   Post-op Pain Management:    Induction: Intravenous  PONV Risk Score and Plan: Ondansetron and Dexamethasone  Airway Management Planned: Oral ETT  Additional Equipment:   Intra-op Plan:   Post-operative Plan: Extubation in OR  Informed Consent: I have reviewed the patients History and Physical, chart, labs and discussed the procedure including the risks, benefits and alternatives for the proposed anesthesia with the patient or authorized representative who has indicated his/her understanding and acceptance.     Dental advisory given  Plan Discussed with: CRNA and Anesthesiologist  Anesthesia Plan Comments:         Anesthesia Quick Evaluation

## 2018-08-02 NOTE — Progress Notes (Signed)
Received Pt from Endo at 2030 No complaints noted resting quietly at present

## 2018-08-02 NOTE — Transfer of Care (Signed)
Immediate Anesthesia Transfer of Care Note  Patient: Alejandra [name]  Procedure(s) Performed: VIDEO BRONCHOSCOPY WITH ENDOBRONCHIAL NAVIGATION (N/A Chest) VIDEO BRONCHOSCOPY WITH RADIAL ENDOBRONCHIAL ULTRASOUND (N/A Chest)  Patient Location: PACU  Anesthesia Type:General  Level of Consciousness: awake, alert , drowsy and patient cooperative  Airway & Oxygen Therapy: Patient Spontanous Breathing and Patient connected to nasal cannula oxygen  Post-op Assessment: Report given to RN, Post -op Vital signs reviewed and stable and Patient moving all extremities X 4  Post vital signs: Reviewed and stable  Last Vitals:  Vitals Value Taken Time  BP 108/70 08/02/2018  7:10 PM  Temp    Pulse 100 08/02/2018  7:13 PM  Resp 19 08/02/2018  7:13 PM  SpO2 94 % 08/02/2018  7:13 PM  Vitals shown include unvalidated device data.  Last Pain:  Vitals:   08/02/18 1308  TempSrc: Oral  PainSc:       Patients Stated Pain Goal: 0 (21/11/55 2080)  Complications: No apparent anesthesia complications

## 2018-08-03 ENCOUNTER — Encounter (HOSPITAL_COMMUNITY): Payer: Self-pay | Admitting: Pulmonary Disease

## 2018-08-03 DIAGNOSIS — I712 Thoracic aortic aneurysm, without rupture, unspecified: Secondary | ICD-10-CM

## 2018-08-03 LAB — MAGNESIUM: Magnesium: 1.7 mg/dL (ref 1.7–2.4)

## 2018-08-03 LAB — COMPREHENSIVE METABOLIC PANEL
ALBUMIN: 2.5 g/dL — AB (ref 3.5–5.0)
ALT: 46 U/L — ABNORMAL HIGH (ref 0–44)
AST: 43 U/L — ABNORMAL HIGH (ref 15–41)
Alkaline Phosphatase: 123 U/L (ref 38–126)
Anion gap: 13 (ref 5–15)
BUN: 25 mg/dL — ABNORMAL HIGH (ref 8–23)
CO2: 24 mmol/L (ref 22–32)
Calcium: 8.2 mg/dL — ABNORMAL LOW (ref 8.9–10.3)
Chloride: 97 mmol/L — ABNORMAL LOW (ref 98–111)
Creatinine, Ser: 1.06 mg/dL — ABNORMAL HIGH (ref 0.44–1.00)
GFR calc Af Amer: 56 mL/min — ABNORMAL LOW (ref >60)
GFR calc non Af Amer: 48 mL/min — ABNORMAL LOW (ref >60)
GLUCOSE: 363 mg/dL — AB (ref 70–99)
Potassium: 6.2 mmol/L — ABNORMAL HIGH (ref 3.5–5.1)
Sodium: 134 mmol/L — ABNORMAL LOW (ref 135–145)
Total Bilirubin: 1 mg/dL (ref 0.3–1.2)
Total Protein: 7 g/dL (ref 6.5–8.1)

## 2018-08-03 LAB — GLUCOSE, CAPILLARY
Glucose-Capillary: 333 mg/dL — ABNORMAL HIGH (ref 70–99)
Glucose-Capillary: 333 mg/dL — ABNORMAL HIGH (ref 70–99)
Glucose-Capillary: 185 mg/dL — ABNORMAL HIGH (ref 70–99)

## 2018-08-03 LAB — CBC
HCT: 26.9 % — ABNORMAL LOW (ref 36.0–46.0)
Hemoglobin: 8.8 g/dL — ABNORMAL LOW (ref 12.0–15.0)
MCH: 24.9 pg — ABNORMAL LOW (ref 26.0–34.0)
MCHC: 32.7 g/dL (ref 30.0–36.0)
MCV: 76 fL — ABNORMAL LOW (ref 80.0–100.0)
NRBC: 0 % (ref 0.0–0.2)
Platelets: 384 10*3/uL (ref 150–400)
RBC: 3.54 MIL/uL — ABNORMAL LOW (ref 3.87–5.11)
RDW: 15 % (ref 11.5–15.5)
WBC: 13.7 10*3/uL — AB (ref 4.0–10.5)

## 2018-08-03 LAB — BASIC METABOLIC PANEL
Anion gap: 12 (ref 5–15)
BUN: 30 mg/dL — ABNORMAL HIGH (ref 8–23)
CO2: 26 mmol/L (ref 22–32)
Calcium: 8.1 mg/dL — ABNORMAL LOW (ref 8.9–10.3)
Chloride: 97 mmol/L — ABNORMAL LOW (ref 98–111)
Creatinine, Ser: 1.1 mg/dL — ABNORMAL HIGH (ref 0.44–1.00)
GFR calc Af Amer: 53 mL/min — ABNORMAL LOW (ref >60)
GFR calc non Af Amer: 46 mL/min — ABNORMAL LOW (ref >60)
Glucose, Bld: 377 mg/dL — ABNORMAL HIGH (ref 70–99)
Potassium: 4.7 mmol/L (ref 3.5–5.1)
Sodium: 135 mmol/L (ref 135–145)

## 2018-08-03 MED ORDER — WARFARIN SODIUM 5 MG PO TABS
5.0000 mg | ORAL_TABLET | Freq: Once | ORAL | Status: AC
  Administered 2018-08-03: 5 mg via ORAL
  Filled 2018-08-03: qty 1

## 2018-08-03 MED ORDER — INSULIN ASPART 100 UNIT/ML ~~LOC~~ SOLN: 0.0000 [IU] | Freq: Every day | SUBCUTANEOUS | Status: DC

## 2018-08-03 MED ORDER — WARFARIN - PHARMACIST DOSING INPATIENT: Freq: Every day | Status: DC

## 2018-08-03 MED ORDER — INSULIN ASPART 100 UNIT/ML ~~LOC~~ SOLN
0.0000 [IU] | Freq: Three times a day (TID) | SUBCUTANEOUS | Status: DC
  Administered 2018-08-03: 4 [IU] via SUBCUTANEOUS

## 2018-08-03 MED ORDER — INSULIN ASPART 100 UNIT/ML ~~LOC~~ SOLN
3.0000 [IU] | Freq: Three times a day (TID) | SUBCUTANEOUS | Status: DC
  Administered 2018-08-03: 3 [IU] via SUBCUTANEOUS

## 2018-08-03 MED ORDER — GLUCERNA SHAKE PO LIQD
237.0000 mL | Freq: Three times a day (TID) | ORAL | Status: DC
  Filled 2018-08-03 (×3): qty 237

## 2018-08-03 NOTE — Care Plan (Signed)
Pulmonology Plan of Care Discussed re-initiation of anticoagulation with primary team. Mild hemoptysis present on exam in setting of subtherapeutic INR. Hemoglobin stable since admission. Would be reasonable to restart home coumadin and arrange for INR check as outpatient.  If patient develops worsening hemoptysis, recommend discontinuing coumadin and contacting medical provider for evaluation.  Patient scheduled to follow-up in Pulmonary Clinic with me (Dr. Loanne Drilling) on 08/24/18 at 2:30 PM.  Alejandra [name] Rodman Pickle, MD [facility] [facility] Pulmonary Critical Care 08/03/2018 5:54 PM  Personal pager: [phone] If unanswered, please page CCM On-call: [phone]

## 2018-08-03 NOTE — Progress Notes (Signed)
Patient given discharge instructions, and verbalized an understanding of all discharge instructions.  Patient agrees with discharge plan, and is being discharged in stable medical condition.  Patient given transportation via wheelchair. 

## 2018-08-03 NOTE — Discharge Summary (Signed)
Physician Discharge Summary  Alejandra [name] IAX:[id number] DOB: 10-Oct-1931 DOA: 07/28/2018  PCP: Vivi Barrack, MD  Admit date: 07/28/2018 Discharge date: 08/03/2018  Time spent: 40 minutes  Recommendations for Outpatient Follow-up:  1. Follow outpatient CBC/CMP 2. Follow up final biopsy results and ensure follow up with pulmonology as outpatient as scheduled 3. Warfarin has been restarted, she'll need follow up with coumadin clinic.  Discussed precautions regarding hemoptysis.  Holding aspirin for now. 4. Blood sugars ran high here, discussed with pt and family, will need close follow up with PCP and monitoring at home 5. 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, needs continued outpatient follow up  Discharge Diagnoses:  Principal Problem:   Mass of middle lobe of right lung Active Problems:   COPD (chronic obstructive pulmonary disease) ([name])   Hypertension associated with diabetes ([place])   Anticoagulated on Coumadin   Atrial fibrillation, chronic   Type 2 diabetes mellitus with stage 3 chronic kidney disease, without long-term current use of insulin ([name])   Acute lower UTI   Hemoptysis   Oxygen dependent   Acute on chronic respiratory failure with hypoxia ([name])   Malnutrition of moderate degree   Thoracic aortic aneurysm [facility])   Discharge Condition: stable  Diet recommendation: heart healthy  Filed Weights   07/31/18 0746 08/02/18 1308  Weight: 61.3 kg 61 kg    History of present illness:  Per Dr. Soyla Dryer [name] a 85 y.o.femalewith medical history significant ofCOPD on 1-2LO2at baseline, DM2, HTN, prior PE.  Patient presents to the ED with 2-3 week history of hemoptysis. Cough perisistent, hemoptysis intermittent. Wt loss, generalized weakness, and poor appetite. No fever nor chills. No N/V/D.  PCPs office CXR showed lung mass, sent in for further work up.   ED Course:CT scan shows 4cmRML lung mass, likely primary bronchogenic  carcinoma.  UA shows UTI.  Hospital Summary: She was admitted for hemoptysis and found to have an enlarging right middle lobe mass on CT.  She had pulmonology consult and had bronchoscopy on 1/15 with biopsy and brushings.  She was stable on 1/16 and discharged with outpatient follow up.  Instructed to restart warfarin on discharge.  See below for additional details  Hospital Course:  1 Right middle lobe lung mass  Hemoptysis Noted on chest x-ray done at PCPs office. CT concerning for enlarging 4 cm RML mass suggestive of primary bronchogenic carcinoma Pulm c/s, bronchoscopy 1/15 S/p transbronchial needle brushings and need biopsies as well as forceps biopsies (see op note) Bx and cytology pending, will need follow up outpatient  Will need follow up with pulmonology as outpatient on 2/6 as scheduled Discussed re-initiation of anticoagulation with pulm, will resume warfarin at this point in time - discussed with pt and family return precautions as well as need for follow up with coumadin clinic  2. Acute on chronic respiratory failure with hypoxia Pt improved to baseline 1-2 L by [place] prior to discharge (of note, incorrectly documented in chart per discussion with nursing - in chart noted at 3 L by [place]).  Pt noted to be 90% on RA this afternoon prior to discharge.   Fever: 1/15, improved now. None recurrent.  WBC count slightly elevated, but fluctuating.  Pt symptomatically appears improved.  Continue to follow outpatient.   3. Pyuria versus UTI Urine cultures with 20,000 colonies of multiple species present. Status post 3 days IV Rocephin. Antibiotics discontinued.   4. Atrial fibrillation Continue metoprolol for rate control. INR currently subtherapeutic.  Coumadin  restarted as noted above  5. History of remote PE on Coumadin INRnow subtherapeutic at 1.57.  Coumadin restarted as noted above.  6. Diabetes mellitus type 2 Hemoglobin A1c was 6.5 on 04/05/2018.  SSI BG elevated here, will need close follow up outpatient, discussed with pt.  7. Hypertension Blood pressureimproved. Cozaar and Dyazide discontinued. Continue Lopressor. Gentle hydration. Follow.   Thoracic aortic aneurysm: outpatient follow up per rads  Procedures:  Bronchoscopy 1/15  Consultations:  pulmonology  Discharge Exam: Vitals:   08/03/18 1001 08/03/18 1439  BP:  117/66  Pulse:  78  Resp:  20  Temp:  98.2 F (36.8 C)  SpO2: 95% 100%   Feeling better.  Hemoptysis is less, she notes much better than on presentation. Discussed plan for d/c with pt and daughter.  General: No acute distress. Cardiovascular: Heart sounds show a regular rate, and rhythm Lungs: Clear to auscultation bilaterally  Abdomen: Soft, nontender, nondistended Neurological: Alert and oriented 3. Moves all extremities 4. Cranial nerves II through XII grossly intact. Skin: Warm and dry. No rashes or lesions. Extremities: No clubbing or cyanosis. No edema.   Discharge Instructions   Discharge Instructions    Call MD for:  difficulty breathing, headache or visual disturbances   Complete by:  As directed    Call MD for:  extreme fatigue   Complete by:  As directed    Call MD for:  hives   Complete by:  As directed    Call MD for:  persistant dizziness or light-headedness   Complete by:  As directed    Call MD for:  persistant nausea and vomiting   Complete by:  As directed    Call MD for:  redness, tenderness, or signs of infection (pain, swelling, redness, odor or green/yellow discharge around incision site)   Complete by:  As directed    Call MD for:  severe uncontrolled pain   Complete by:  As directed    Call MD for:  temperature >100.4   Complete by:  As directed    Diet - low sodium heart healthy   Complete by:  As directed    Diet - low sodium heart healthy   Complete by:  As directed    Discharge instructions   Complete by:  As directed    You were seen for  coughing up blood.  You had imaging with a lung mass concerning for cancer which was biopsied by the lung doctors.  They will follow up the results with you.  You have a follow up appointment on 08/24/2018 with the lung doctors.  We'll restart your coumadin.  If your bleeding becomes worse, please stop the coumadin and call your PCP.  Hold your aspirin for now, discuss with your PCP regarding whether this can restarted.  Please follow up with the coumadin clinic as an outpatient.  Your blood sugars were high here, follow your blood sugars 4 times a day and bring the results to your PCP in follow up.  Follow up your aneurysm with your PCP.   Return for new, recurrent, or worsening symptoms.  Please ask your PCP to request records from this hospitalization so they know what was done and what the next steps will be.   Increase activity slowly   Complete by:  As directed    Increase activity slowly   Complete by:  As directed      Allergies as of 08/03/2018      Reactions   Penicillins Nausea  And Vomiting, Rash, Other (See Comments)   Childhood reaction, tolerated multiple cephs in 2013. Has patient had a PCN reaction causing immediate rash, facial/tongue/throat swelling, SOB or lightheadedness with hypotension: Yes Has patient had a PCN reaction causing severe rash involving mucus membranes or skin necrosis: Yes Has patient had a PCN reaction that required hospitalization: Yes Has patient had a PCN reaction occurring within the last 10 years: No      Medication List    STOP taking these medications   aspirin 81 MG tablet     TAKE these medications   acetaminophen 500 MG tablet Commonly known as:  TYLENOL Take 1,000 mg by mouth 2 (two) times daily as needed for mild pain.   adapalene 0.1 % gel Commonly known as:  DIFFERIN APPLY TO SCALP PSORIASIS ONCE DAILY What changed:    how much to take  how to take this  when to take this  additional instructions   albuterol 108 (90  Base) MCG/ACT inhaler Commonly known as:  PROVENTIL HFA;VENTOLIN HFA Inhale 2 puffs into the lungs every 6 (six) hours as needed for wheezing or shortness of breath.   budesonide-formoterol 80-4.5 MCG/ACT inhaler Commonly known as:  SYMBICORT Inhale 2 puffs into the lungs 2 (two) times daily.   dextromethorphan-guaiFENesin 30-600 MG 12hr tablet Commonly known as:  MUCINEX DM Take 1 tablet by mouth 2 (two) times daily.   gabapentin 100 MG capsule Commonly known as:  NEURONTIN Start with 1 tab po qhs X 1 week, then increase to 1 tab po bid X 1 week then 1 tab po tid prn What changed:    how much to take  how to take this  when to take this  reasons to take this  additional instructions   Garlic 3267 MG Caps Take 1,000 mg by mouth daily.   glipiZIDE 5 MG tablet Commonly known as:  GLUCOTROL TAKE 1 TABLET(5 MG) BY MOUTH TWICE DAILY BEFORE A MEAL What changed:  See the new instructions.   losartan 25 MG tablet Commonly known as:  COZAAR Take 1 tablet (25 mg total) by mouth daily.   lovastatin 20 MG tablet Commonly known as:  MEVACOR Take 1 tablet (20 mg total) by mouth at bedtime.   metFORMIN 500 MG tablet Commonly known as:  GLUCOPHAGE TAKE 1 TABLET(500 MG) BY MOUTH TWICE DAILY WITH A MEAL What changed:  See the new instructions.   metoprolol tartrate 100 MG tablet Commonly known as:  LOPRESSOR Take 1 tablet (100 mg total) by mouth 2 (two) times daily.   multivitamin tablet Take 1 tablet by mouth daily.   omega-3 acid ethyl esters 1 g capsule Commonly known as:  LOVAZA Take 2 g by mouth 2 (two) times daily.   pantoprazole 40 MG tablet Commonly known as:  PROTONIX Take 1 tablet (40 mg total) by mouth daily.   Tazarotene 0.1 % Foam Apply to scalp psoriasis 1 time daily. What changed:    how to take this  when to take this  additional instructions   Tiotropium Bromide Monohydrate 2.5 MCG/ACT Aers Commonly known as:  SPIRIVA RESPIMAT Inhale 2 puffs  into the lungs daily.   triamcinolone ointment 0.5 % Commonly known as:  KENALOG APPLY TOPICALLY TO THE AFFECTED AREA TWICE DAILY What changed:  See the new instructions.   triamterene-hydrochlorothiazide 37.5-25 MG capsule Commonly known as:  DYAZIDE Take 1 each (1 capsule total) by mouth daily.   Vitamin D3 25 MCG (1000 UT) Caps Take 1 capsule  by mouth daily.   warfarin 5 MG tablet Commonly known as:  COUMADIN Take as directed. If you are unsure how to take this medication, talk to your nurse or doctor. Original instructions:  Take 1-1.5 tablets (5-7.5 mg total) by mouth See admin instructions. Take 1 and 1/2 tablets on Monday then take 1 tablet the other days What changed:  additional instructions      Allergies  Allergen Reactions  . Penicillins Nausea And Vomiting, Rash and Other (See Comments)    Childhood reaction, tolerated multiple cephs in 2013. Has patient had a PCN reaction causing immediate rash, facial/tongue/throat swelling, SOB or lightheadedness with hypotension: Yes Has patient had a PCN reaction causing severe rash involving mucus membranes or skin necrosis: Yes Has patient had a PCN reaction that required hospitalization: Yes Has patient had a PCN reaction occurring within the last 10 years: No    Follow-up Information    Vivi Barrack, MD Follow up.   Specialty:  Family Medicine Contact information: [place] [address] [phone]        Constance Haw, MD .   Specialty:  Cardiology Contact information: [address] [place] [address] [phone]        Margaretha Seeds, MD Follow up.   Specialty:  Pulmonary Disease Why:  You have an appointment on 2/6 at 2:30 Pulmonology should also call about your biopsy results Contact information: [place] [address] [phone]        [facility] Follow up.   Specialty:  Cardiology Why:  Please call for  follow up with coumadin clinic Contact information: [address], [place] [address] [phone]           The results of significant diagnostics from this hospitalization (including imaging, microbiology, ancillary and laboratory) are listed below for reference.    Significant Diagnostic Studies: Dg Chest 2 View  Result Date: 07/28/2018 CLINICAL DATA:  Hemoptysis EXAM: CHEST - 2 VIEW COMPARISON:  04/06/2018 FINDINGS: New mass lesion right middle lobe measuring approximately 3 cm. Dual lead pacemaker. Heart size upper normal. Negative for heart failure. No effusion. Mild hyperinflation of the lungs IMPRESSION: 3 cm right middle lobe mass, new since 04/06/2018. This is concerning for neoplasm however given its rapid development could be an area of infection. Followup PA and lateral chest X-ray is recommended in 3-4 weeks following trial of antibiotic therapy to ensure resolution and exclude underlying malignancy. Electronically Signed   By: Franchot Gallo M.D.   On: 07/28/2018 10:59   Ct Angio Chest Pe W And/or Wo Contrast  Result Date: 07/28/2018 CLINICAL DATA:  hemoptysis. +weakness, cough and decrease in PO intake x 3 weeks EXAM: CT ANGIOGRAPHY CHEST WITH CONTRAST TECHNIQUE: Multidetector CT imaging of the chest was performed using the standard protocol during bolus administration of intravenous contrast. Multiplanar CT image reconstructions and MIPs were obtained to evaluate the vascular anatomy. CONTRAST:  134mL ISOVUE-370 IOPAMIDOL (ISOVUE-370) INJECTION 76% COMPARISON:  02/02/2017 FINDINGS: Cardiovascular: Left subclavian dual lead transvenous pacemaker, leads extending into the right atrium and towards the right ventricular apex. Heart size normal. No pericardial effusion. Satisfactory opacification of pulmonary arteries noted, and there is no evidence of pulmonary emboli. Coronary calcifications. There is good contrast opacification of the thoracic aorta.  There is eccentric aneurysmal dilatation of the distal arch up to 3.8 cm diameter, aorta returning to normal caliber of 3 cm in  the proximal descending segment. Moderate scattered calcified atheromatous plaque in the arch and descending thoracic segment. There is some eccentric nonocclusive mural thrombus in the visualized proximal abdominal aorta. Tines of infrarenal stent graft are partially visualized. Classic 3 vessel brachiocephalic arterial origin anatomy without proximal stenosis. Mediastinum/Nodes: No hilar or mediastinal adenopathy. Lungs/Pleura: No pleural effusion. No pneumothorax. Pulmonary emphysema (ICD10-J43.9). 4 cm spiculated mass in the right middle lobe, previously 1 cm. 2 subpleural satellite nodules measuring 6 and 10 mm are relatively stable. Smaller nonspecific nodules in the posterior upper lobes are stable. Upper Abdomen: Cholecystectomy clips. Stable bilateral adrenal fullness. No acute findings. Musculoskeletal: Old healed sternal fracture. No acute fracture or worrisome bone lesion. Review of the MIP images confirms the above findings. IMPRESSION: 1. Negative for acute PE or thoracic aortic dissection. 2. Enlarging 4 cm right middle lobe mass suggesting primary bronchogenic carcinoma. Consider pulmonary/thoracic consultation. 3. Coronary and Aortic Atherosclerosis (ICD10-170.0). 4. 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 Recommend annual imaging followup by CTA or MRA. This recommendation follows 2010 ACCF/AHA/AATS/ACR/ASA/SCA/SCAI/SIR/STS/SVM Guidelines for the Diagnosis and Management of Patients with Thoracic Aortic Disease. Circulation.2010; 121: W299-B716 Electronically Signed   By: Lucrezia Europe M.D.   On: 07/28/2018 17:03   Dg C-arm Bronchoscopy  Result Date: 08/02/2018 C-ARM BRONCHOSCOPY: Fluoroscopy was utilized by the requesting physician.  No radiographic interpretation.    Microbiology: Recent Results (from the past 240 hour(s))  Culture, Urine     Status:  Abnormal   Collection Time: 07/28/18  5:04 PM  Result Value Ref Range Status   Specimen Description URINE, RANDOM  Final   Special Requests   Final    NONE Performed at [facility] [place] [address] [place], [place] [address]    Culture (A)  Final    20,000 COLONIES/mL MULTIPLE SPECIES PRESENT, SUGGEST RECOLLECTION   Report Status 07/30/2018 FINAL  Final     Labs: Basic Metabolic Panel: Recent Labs  Lab 07/29/18 0925  07/31/18 0639 08/01/18 0835 08/02/18 0604 08/03/18 0526 08/03/18 0714  NA 137   < > 137 137 140 134* 135  K 4.1   < > 4.0 3.8 4.0 6.2* 4.7  CL 95*   < > 98 98 98 97* 97*  CO2 30   < > 27 27 30 24 26   GLUCOSE 177*   < > 175* 233* 185* 363* 377*  BUN 40*   < > 36* 21 22 25* 30*  CREATININE 1.39*   < > 1.27* 1.06* 0.96 1.06* 1.10*  CALCIUM 8.9   < > 8.3* 8.8* 8.7* 8.2* 8.1*  MG 2.1  --   --   --   --  1.7  --    < > = values in this interval not displayed.   Liver Function Tests: Recent Labs  Lab 07/28/18 1503 07/29/18 0925 08/03/18 0526  AST 35 27 43*  ALT 33 31 46*  ALKPHOS 132* 131* 123  BILITOT 0.6 0.5 1.0  PROT 8.4* 7.5 7.0  ALBUMIN 3.2* 2.6* 2.5*   No results for input(s): LIPASE, AMYLASE in the last 168 hours. No results for input(s): AMMONIA in the last 168 hours. CBC: Recent Labs  Lab 07/28/18 1503 07/29/18 0925 07/30/18 0558 07/31/18 0639 08/01/18 0835 08/02/18 0604 08/03/18 0526  WBC 12.6* 14.3* 12.3* 12.2* 13.1* 12.9* 13.7*  NEUTROABS 10.2* 11.6* 9.7* 9.7*  --   --   --   HGB 10.3* 8.8* 8.8* 7.9* 8.7* 8.4* 8.8*  HCT 31.4* 26.1*  26.4* 23.7* 26.5* 25.2* 26.9*  MCV 76.6* 75.7* 74.2* 75.0* 75.5* 75.2* 76.0*  PLT 347 355 402* 362 367 341 384   Cardiac Enzymes: No results for input(s): CKTOTAL, CKMB, CKMBINDEX, TROPONINI in the last 168 hours. BNP: BNP (last 3 results) Recent Labs    07/28/18 1504  BNP 108.2*    ProBNP (last 3 results) No results for input(s): PROBNP in the last 8760  hours.  CBG: Recent Labs  Lab 08/02/18 1912 08/02/18 2129 08/03/18 0743 08/03/18 1247 08/03/18 1718  GLUCAP 155* 194* 333* 333* 185*       Signed:  Fayrene Helper MD.  [facility] 08/03/2018, 11:59 PM

## 2018-08-03 NOTE — Progress Notes (Signed)
Results for NASHIA, REMUS (MRN [id number]) as of 08/03/2018 13:19  Ref. Range 08/02/2018 15:36 08/02/2018 19:12 08/02/2018 21:29 08/03/2018 07:43 08/03/2018 12:47  Glucose-Capillary Latest Ref Range: 70 - 99 mg/dL 158 (H) 155 (H) 194 (H) 333 (H) 333 (H)  Noted that CBGs are now greater than 300 mg/dl.  Recommend adding Lantus 10 units daily and continuing Novolog MODERATE correction scale TID. Titrate dosage of Lantus as needed.  Harvel Ricks RN BSN CDE Diabetes Coordinator Pager: [phone]  8am-5pm

## 2018-08-03 NOTE — Care Management Important Message (Signed)
Important Message  Patient Details  Name: Alejandra [name] MRN: [id number] Date of Birth: 1931-10-24   Medicare Important Message Given:  Yes    Kerin Salen 08/03/2018, 10:53 AMImportant Message  Patient Details  Name: Alejandra [name] MRN: [id number] Date of Birth: 30-Jun-1932   Medicare Important Message Given:  Yes    Kerin Salen 08/03/2018, 10:53 AM

## 2018-08-03 NOTE — Progress Notes (Signed)
CRITICAL VALUE ALERT  Critical Value:k 6.2  Date & Time Notied:  1/16 0630  Provider Notified: yes  Orders Received/Actions taken: pending

## 2018-08-03 NOTE — Progress Notes (Signed)
[facility] for Warfarin Indication: atrial fibrillation, Hx remote PE  Allergies  Allergen Reactions  . Penicillins Nausea And Vomiting, Rash and Other (See Comments)    Childhood reaction, tolerated multiple cephs in 2013. Has patient had a PCN reaction causing immediate rash, facial/tongue/throat swelling, SOB or lightheadedness with hypotension: Yes Has patient had a PCN reaction causing severe rash involving mucus membranes or skin necrosis: Yes Has patient had a PCN reaction that required hospitalization: Yes Has patient had a PCN reaction occurring within the last 10 years: No    Patient Measurements: Height: 5\' 5"  (165.1 cm) Weight: 134 lb 7.7 oz (61 kg) IBW/kg (Calculated) : 57  Vital Signs: Temp: 98 F (36.7 C) (01/16 0336) Temp Source: Oral (01/16 0336) BP: 129/82 (01/16 0336) Pulse Rate: 78 (01/16 0336)  Labs: Recent Labs    08/01/18 0835 08/02/18 0604 08/03/18 0526 08/03/18 0714  HGB 8.7* 8.4* 8.8*  --   HCT 26.5* 25.2* 26.9*  --   PLT 367 341 384  --   LABPROT 18.6* 16.3*  --   --   INR 1.57 1.32  --   --   CREATININE 1.06* 0.96 1.06* 1.10*   Estimated Creatinine Clearance: 34.3 mL/min (A) (by C-G formula based on SCr of 1.1 mg/dL (H)).  Medical History: Past Medical History:  Diagnosis Date  . AAA (abdominal aortic aneurysm) ([place])   . Arthritis   . COPD (chronic obstructive pulmonary disease) ([place])   . DM (diabetes mellitus) ([place])   . Dyslipidemia   . Hyperlipidemia   . Hypertension   . Left foot infection 2013  . 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 2013  . Pulmonary embolism ([name])    Medications:  Scheduled:  . adapalene  1 application Topical Daily  . budesonide (PULMICORT) nebulizer solution  0.5 mg Nebulization BID  . cholecalciferol  1,000 Units Oral Daily  . dextromethorphan-guaiFENesin  1 tablet Oral BID  . feeding supplement (GLUCERNA SHAKE)  237 mL Oral TID BM  . insulin aspart  0-15 Units Subcutaneous TID WC  .  ipratropium-albuterol  3 mL Nebulization BID  . metoprolol tartrate  100 mg Oral BID  . multivitamin with minerals  1 tablet Oral Daily  . omega-3 acid ethyl esters  2 g Oral BID  . pantoprazole  40 mg Oral Daily  . pravastatin  20 mg Oral q1800  . Tazarotene   Apply externally Daily   Assessment: 17 yoF, admit 1/10 with hemoptysis x 2-3 wk, new Lung mass on CXray at PCP, CT result: RML mass. PTA Warfarin for Afib, remote hx PE. Home dose 5mg  daily, exc 7.5mg  on Mondays. Admit INR 3.97  Today, 08/03/2018 Last INR 1/15 = 1.32  Goal of Therapy:  INR 2-3 Monitor platelets by anticoagulation protocol: Yes   Plan:   Warfarin 5mg  today at 1800  Daily Protime/INR  Monitor for s/s bleed  Daily CBC  Minda Ditto PharmD Pager [phone] 08/03/2018, 1:03 PM

## 2018-08-07 ENCOUNTER — Ambulatory Visit (INDEPENDENT_AMBULATORY_CARE_PROVIDER_SITE_OTHER): Payer: Medicare Other

## 2018-08-07 DIAGNOSIS — I480 Paroxysmal atrial fibrillation: Secondary | ICD-10-CM

## 2018-08-07 DIAGNOSIS — Z950 Presence of cardiac pacemaker: Secondary | ICD-10-CM

## 2018-08-08 ENCOUNTER — Telehealth: Payer: Self-pay | Admitting: Internal Medicine

## 2018-08-08 ENCOUNTER — Ambulatory Visit (INDEPENDENT_AMBULATORY_CARE_PROVIDER_SITE_OTHER): Payer: Medicare Other | Admitting: General Practice

## 2018-08-08 DIAGNOSIS — Z7901 Long term (current) use of anticoagulants: Secondary | ICD-10-CM | POA: Diagnosis not present

## 2018-08-08 DIAGNOSIS — I482 Chronic atrial fibrillation, unspecified: Secondary | ICD-10-CM | POA: Diagnosis not present

## 2018-08-08 DIAGNOSIS — C3491 Malignant neoplasm of unspecified part of right bronchus or lung: Secondary | ICD-10-CM

## 2018-08-08 LAB — CUP PACEART REMOTE DEVICE CHECK
Battery Remaining Percentage: 95.5 %
Battery Voltage: 3.01 V
Brady Statistic AP VP Percent: 1 %
Brady Statistic AP VS Percent: 1.8 %
Brady Statistic AS VP Percent: 1 %
Brady Statistic AS VS Percent: 98 %
Brady Statistic RA Percent Paced: 1.3 %
Brady Statistic RV Percent Paced: 1 %
Implantable Lead Implant Date: 20180720
Implantable Lead Implant Date: 20180720
Implantable Lead Location: 753859
Implantable Lead Location: 753860
Implantable Pulse Generator Implant Date: 20180720
Lead Channel Impedance Value: 360 Ohm
Lead Channel Impedance Value: 440 Ohm
Lead Channel Pacing Threshold Amplitude: 0.75 V
Lead Channel Pacing Threshold Pulse Width: 0.5 ms
Lead Channel Pacing Threshold Pulse Width: 0.5 ms
Lead Channel Sensing Intrinsic Amplitude: 3.7 mV
Lead Channel Sensing Intrinsic Amplitude: 8.1 mV
Lead Channel Setting Pacing Amplitude: 2 V
Lead Channel Setting Pacing Amplitude: 2.5 V
Lead Channel Setting Pacing Pulse Width: 0.5 ms
Lead Channel Setting Sensing Sensitivity: 2 mV
MDC IDC MSMT BATTERY REMAINING LONGEVITY: 121 mo
MDC IDC MSMT LEADCHNL RA PACING THRESHOLD AMPLITUDE: 0.75 V
MDC IDC SESS DTM: 20200120215117
Pulse Gen Model: 2272
Pulse Gen Serial Number: 8917538

## 2018-08-08 LAB — POCT INR: INR: 8 — AB (ref 2.0–3.0)

## 2018-08-08 NOTE — Progress Notes (Signed)
Rad Onc referral placed for abnormal EMN results.

## 2018-08-08 NOTE — Telephone Encounter (Signed)
Spoke with the pt's daughter  She is asking if Dr Loanne Drilling has reviewed bronch results  Please advise thanks

## 2018-08-08 NOTE — Telephone Encounter (Signed)
I called patient regarding pathology results from her navigational bronchoscopy on 08/02/18.  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 I addressed questions to the best of my knowledge. Will refer patient to radiation oncology.  Patient expressed understanding. Patient's cousin assisted with the phone call and stated that best way to reach patient is via cell phone or her daughter's cell phone.  Rodman Pickle, MD

## 2018-08-08 NOTE — Progress Notes (Signed)
Remote pacemaker transmission.   

## 2018-08-08 NOTE — Patient Instructions (Addendum)
Pre visit review using our clinic review tool, if applicable. No additional management support is needed unless otherwise documented below in the visit note.  INR is greater than 8.0.  Stop coumadin!  Go to ER if unusual bleeding occurs.  Re-check in 1 week. Pt refused lab draw.

## 2018-08-09 ENCOUNTER — Telehealth: Payer: Self-pay

## 2018-08-09 NOTE — Telephone Encounter (Signed)
LM for patient to return call to conduct TCM call.

## 2018-08-10 ENCOUNTER — Ambulatory Visit
Admission: RE | Admit: 2018-08-10 | Discharge: 2018-08-10 | Disposition: A | Discharge status: Home / Self Care | Payer: Medicare Other | Source: Ambulatory Visit | Attending: Radiation Oncology | Admitting: Radiation Oncology

## 2018-08-10 ENCOUNTER — Other Ambulatory Visit: Payer: Self-pay | Admitting: Family Medicine

## 2018-08-10 ENCOUNTER — Encounter: Payer: Self-pay | Admitting: Radiation Oncology

## 2018-08-10 ENCOUNTER — Other Ambulatory Visit: Payer: Self-pay

## 2018-08-10 VITALS — BP 112/63 | HR 91 | Temp 98.9°F | Resp 28 | Ht 65.0 in | Wt 132.2 lb

## 2018-08-10 DIAGNOSIS — C342 Malignant neoplasm of middle lobe, bronchus or lung: Secondary | ICD-10-CM

## 2018-08-10 DIAGNOSIS — C3491 Malignant neoplasm of unspecified part of right bronchus or lung: Secondary | ICD-10-CM | POA: Diagnosis not present

## 2018-08-10 DIAGNOSIS — Z510 Encounter for antineoplastic radiation therapy: Secondary | ICD-10-CM | POA: Diagnosis not present

## 2018-08-10 DIAGNOSIS — R918 Other nonspecific abnormal finding of lung field: Secondary | ICD-10-CM

## 2018-08-10 MED ORDER — LOVASTATIN 20 MG PO TABS: 20.0000 mg | ORAL_TABLET | Freq: Every day | ORAL | 1 refills | Status: DC

## 2018-08-10 NOTE — Progress Notes (Signed)
Thoracic Location of Tumor / Histology: Per CT Angio Chest 07/28/18:  IMPRESSION: 1. Negative for acute PE or thoracic aortic dissection. 2. Enlarging 4 cm right middle lobe mass suggesting primary bronchogenic carcinoma. Consider pulmonary/thoracic consultation.   Patient presented with symptoms of: 07/28/18 ED Note: Sent by PCP Dimas Chyle for further evaluation of hemoptysis. +weakness, cough and decrease in PO intake x 3 weeks. Sent here for CT scan of lungs per paperwork. +oxygen dependent   Biopsies revealed: 08/02/18:  Diagnosis Lung, biopsy, Right middle - MICROSCOPIC FOCUS OF NON-SMALL CELL CARCINOMA. SEE COMMENT. Microscopic Comment Positive staining for TTF-1 suggests adenocarcinoma; however, there is limited tumor on immunohistochemistry. P63 and CK5/6 are negative. There is insufficient tissue for additional studies.  08/02/18: Diagnosis TRANSBRONCHIAL NEEDLE ASPIRATION (A) NAVIGATION RIGHT MIDDLE LOBE BRUSHINGS (SPECIMEN 1 OF 5, COLLECTED 08/02/2018): MALIGNANT CELLS CONSISTENT WITH NON-SMALL CELL CARCINOMA.   Tobacco/Marijuana/Snuff/ETOH use: PMFSH: History is significant for oxygen dependent COPD and smoking history.  She reports that she quit smoking about 8 years ago. Her smoking use included cigarettes. She has a 27.00 pack-year smoking history. She has never used smokeless tobacco. She reports that she does not drink alcohol or use drugs.  Past/Anticipated interventions by cardiothoracic surgery, if any: None at this time  Past/Anticipated interventions by medical oncology, if any: None at this time.  Signs/Symptoms Weight changes, if any: Anorexia  Started several weeks ago.  Patient states she has no appetite.  She is able to take a few bites does not able to eat anymore.  No pain with eating.  No abdominal pain.  No nausea or vomiting.  No constipation. Respiratory complaints, if any: Cough, Acute problem  Started 2 to 3 weeks ago.  Stable over that time.  Cough is  productive of blood-tinged sputum.  No fevers or chills.  No rhinorrhea.  No malaise.  No treatments tried.  No other obvious alleviating or aggravating factors.   Pain issues, if any:  Pt denies c/o pain at this time, although family states pt c/o pain frequently at home.   SAFETY ISSUES:  Prior radiation? No  Pacemaker/ICD? Dual lead pacemaker.  Possible current pregnancy? No, pt is post-menopausal  Is the patient on methotrexate? No  Current Complaints / other details:  Pt presents today for initial consult with Dr. Sondra Come for Radiation Oncology. Pt is accompanied by niece and daughter. Pt scored 5/10 on distress screen. SW consult placed per protocol.   BP 112/63 (Patient Position: Sitting)   Pulse 91   Temp 98.9 F (37.2 C) (Oral)   Resp (!) 28   Ht 5\' 5"  (1.651 m)   Wt 132 lb 3.2 oz (60 kg)   SpO2 93%   PF (!) 4 L/min   BMI 22.00 kg/m   Wt Readings from Last 3 Encounters:  08/10/18 132 lb 3.2 oz (60 kg)  08/02/18 134 lb 7.7 oz (61 kg)  07/28/18 135 lb 3.2 oz (61.3 kg)   Loma Sousa, RN BSN

## 2018-08-10 NOTE — Progress Notes (Signed)
Radiation Oncology         [phone] ________________________________  Initial Outpatient Consultation  Name: Alejandra [name] MRN: [id number]  Date: 08/10/2018  DOB: Jan 17, 1932  JH:ERDEYC, Algis Greenhouse, MD  Margaretha Seeds, MD   REFERRING PHYSICIAN: Margaretha Seeds, MD  DIAGNOSIS: The encounter diagnosis was Mass of middle lobe of right lung. (Adenocarcinoma)  HISTORY OF PRESENT ILLNESS::Alejandra [name] is a 85 y.o. female who presented to her PCP, Dr. Dimas Chyle, on 07/28/18 with hemoptysis, weakness cough, and decrease in PO intake x3 weeks. She was initially evaluated with chest x-ray which identified a 3 cm right middle lobe mass. Dr. [name] sent the patient to the ED that same day for further evaluation. CT Angio Chest showed an enlarging 4 cm right middle lobe mass.   She subsequently underwent bronchoscopy with Dr. Loanne Drilling on 08/02/18. Biopsy of the right middle lung revealed a microscopic focus of non-small cell carcinoma. Positive staining for TTF-1 suggests adenocarcinoma; however, there was limited tumor on immunohistochemistry. Biopsied lymph nodes were negative.  History is significant for oxygen dependent COPD and smoking history.[name] that she quit smoking about 8 years ago. Her smoking use included cigarettes. She has a 27.00 pack-year smoking history. She has never used smokeless tobacco. She reports that she does not drink alcohol or use drugs.  On review of systems.  Hemoptysis has dropped off since her biopsy.  She occasionally will have blood-tinged sputum.  She denies any pain within the chest area.  PREVIOUS RADIATION THERAPY: No  PAST MEDICAL HISTORY:  has a past medical history of AAA (abdominal aortic aneurysm) ([place]), Arthritis, COPD (chronic obstructive pulmonary disease) ([place]), DM (diabetes mellitus) ([place]), Dyslipidemia, Hyperlipidemia, Hypertension, Left foot infection (2013), O2 dependent (Nov. 2013), and Pulmonary embolism ([place]).    PAST SURGICAL  HISTORY: Past Surgical History:  Procedure Laterality Date  . ABDOMINAL AORTIC ANEURYSM REPAIR  10-30-2011  . CHOLECYSTECTOMY N/A 10/14/2016   Procedure: LAPAROSCOPIC CHOLECYSTECTOMY;  Surgeon: Clovis Riley, MD;  Location: [facility];  Service: General;  Laterality: N/A;  . ENDOVASCULAR STENT INSERTION  10/30/2011   Procedure: ENDOVASCULAR STENT GRAFT INSERTION;  Surgeon: Serafina Mitchell, MD;  Location: [facility];  Service: Vascular;  Laterality: N/A;  . PACEMAKER IMPLANT N/A 02/04/2017   Procedure: Pacemaker Implant;  Surgeon: Constance Haw, MD;  Location: [facility];  Service: Cardiovascular;  Laterality: N/A;  . VESICOVAGINAL FISTULA CLOSURE W/ TAH    . VIDEO BRONCHOSCOPY WITH ENDOBRONCHIAL NAVIGATION N/A 08/02/2018   Procedure: VIDEO BRONCHOSCOPY WITH ENDOBRONCHIAL NAVIGATION;  Surgeon: Margaretha Seeds, MD;  Location: [facility];  Service: Thoracic;  Laterality: N/A;    FAMILY HISTORY: family history includes Clotting disorder in her daughter and son; Diabetes in her father and son; Heart attack in her father, mother, and sister; Heart disease in her mother and sister; Hypertension in her brother, father, mother, and sister.  SOCIAL HISTORY:  reports that she quit smoking about 8 years ago. Her smoking use included cigarettes. She has a 27.00 pack-year smoking history. She has never used smokeless tobacco. She reports that she does not drink alcohol or use drugs.  ALLERGIES: Penicillins  MEDICATIONS:  Current Outpatient Medications  Medication Sig Dispense Refill  . acetaminophen (TYLENOL) 500 MG tablet Take 1,000 mg by mouth 2 (two) times daily as needed for mild pain.    Marland Kitchen adapalene (DIFFERIN) 0.1 % gel APPLY TO SCALP PSORIASIS ONCE DAILY (Patient taking differently: Apply 1 application topically daily. ) 45 g  0  . albuterol (PROVENTIL HFA;VENTOLIN HFA) 108 (90 Base) MCG/ACT inhaler Inhale 2 puffs into the lungs every 6 (six) hours as needed for wheezing or shortness of breath. 1  Inhaler 0  . budesonide-formoterol (SYMBICORT) 80-4.5 MCG/ACT inhaler Inhale 2 puffs into the lungs 2 (two) times daily. 1 Inhaler 3  . Cholecalciferol (VITAMIN D3) 1000 UNITS CAPS Take 1 capsule by mouth daily.      Marland Kitchen dextromethorphan-guaiFENesin (MUCINEX DM) 30-600 MG 12hr tablet Take 1 tablet by mouth 2 (two) times daily. 30 tablet 0  . gabapentin (NEURONTIN) 100 MG capsule Start with 1 tab po qhs X 1 week, then increase to 1 tab po bid X 1 week then 1 tab po tid prn (Patient taking differently: Take 100 mg by mouth 3 (three) times daily as needed (for pain). ) 90 capsule 1  . Garlic 6387 MG CAPS Take 1,000 mg by mouth daily.     Marland Kitchen glipiZIDE (GLUCOTROL) 5 MG tablet TAKE 1 TABLET(5 MG) BY MOUTH TWICE DAILY BEFORE A MEAL (Patient taking differently: Take 5 mg by mouth 2 (two) times daily before a meal. ) 180 tablet 0  . losartan (COZAAR) 25 MG tablet Take 1 tablet (25 mg total) by mouth daily. 90 tablet 1  . lovastatin (MEVACOR) 20 MG tablet Take 1 tablet (20 mg total) by mouth at bedtime. 90 tablet 1  . metFORMIN (GLUCOPHAGE) 500 MG tablet TAKE 1 TABLET(500 MG) BY MOUTH TWICE DAILY WITH A MEAL (Patient taking differently: Take 500 mg by mouth 2 (two) times daily with a meal. ) 180 tablet 0  . Multiple Vitamin (MULTIVITAMIN) tablet Take 1 tablet by mouth daily.      Marland Kitchen omega-3 acid ethyl esters (LOVAZA) 1 G capsule Take 2 g by mouth 2 (two) times daily.    . pantoprazole (PROTONIX) 40 MG tablet Take 1 tablet (40 mg total) by mouth daily. 30 tablet 1  . Tazarotene 0.1 % FOAM Apply to scalp psoriasis 1 time daily. (Patient taking differently: Apply topically daily. Apply to scalp psoriasis) 50 g 1  . Tiotropium Bromide Monohydrate (SPIRIVA RESPIMAT) 2.5 MCG/ACT AERS Inhale 2 puffs into the lungs daily. 4 g 11  . triamcinolone ointment (KENALOG) 0.5 % APPLY TOPICALLY TO THE AFFECTED AREA TWICE DAILY (Patient taking differently: Apply 1 application topically 2 (two) times daily. ) 60 g 0  .  triamterene-hydrochlorothiazide (DYAZIDE) 37.5-25 MG capsule Take 1 each (1 capsule total) by mouth daily. 90 capsule 3  . warfarin (COUMADIN) 5 MG tablet Take 1-1.5 tablets (5-7.5 mg total) by mouth See admin instructions. Take 1 and 1/2 tablets on Monday then take 1 tablet the other days (Patient taking differently: Take 5-7.5 mg by mouth See admin instructions. Take 7.5 mg by mouth on Monday then take 5 mg by mouth on the other days) 45 tablet 5  . metoprolol tartrate (LOPRESSOR) 100 MG tablet Take 1 tablet (100 mg total) by mouth 2 (two) times daily. 180 tablet 3   No current facility-administered medications for this encounter.     REVIEW OF SYSTEMS:  REVIEW OF SYSTEMS: A 10+ POINT REVIEW OF SYSTEMS WAS OBTAINED including neurology, dermatology, psychiatry, cardiac, respiratory, lymph, extremities, GI, GU, musculoskeletal, constitutional, reproductive, HEENT. All pertinent positives are noted in the HPI. All others are negative.   PHYSICAL EXAM:  height is 5\' 5"  (1.651 m) and weight is 132 lb 3.2 oz (60 kg). Her oral temperature is 98.9 F (37.2 C). Her blood pressure is 112/63 and her  pulse is 91. Her respiration is 28 (abnormal) and oxygen saturation is 93%.   General: Alert and oriented, in no acute distress, signs of dementia present, patient remains in a wheelchair throughout evaluation.  She has supplemental oxygen in place at 4 L HEENT: Head is normocephalic. Extraocular movements are intact. Oropharynx is clear. Neck: Neck is supple, no palpable cervical or supraclavicular lymphadenopathy. Heart: Regular in rate and rhythm with no murmurs, rubs, or gallops. Chest: Clear to auscultation bilaterally, with no rhonchi, wheezes,   Bibasilar mild crackles present Abdomen: Soft, nontender, nondistended, with no rigidity or guarding. Extremities: No cyanosis or edema. Lymphatics: see Neck Exam Skin: No concerning lesions. Musculoskeletal: symmetric strength and muscle tone  throughout. Neurologic: Cranial nerves II through XII are grossly intact. No obvious focalities. Speech is fluent. Coordination is intact. Psychiatric: Judgment and insight are intact. Affect is appropriate.   ECOG =   0 - Asymptomatic (Fully active, able to carry on all predisease activities without restriction)  1 - Symptomatic but completely ambulatory (Restricted in physically strenuous activity but ambulatory and able to carry out work of a light or sedentary nature. For example, light housework, office work)  2 - Symptomatic, <50% in bed during the day (Ambulatory and capable of all self care but unable to carry out any work activities. Up and about more than 50% of waking hours)  3 - Symptomatic, >50% in bed, but not bedbound (Capable of only limited self-care, confined to bed or chair 50% or more of waking hours)  4 - Bedbound (Completely disabled. Cannot carry on any self-care. Totally confined to bed or chair)  5 - Death   Eustace Pen MM, Creech RH, Tormey DC, et al. [phone]). "Toxicity and response criteria of the [facility]". [place] Oncol. 5 (6): 649-55  LABORATORY DATA:  Lab Results  Component Value Date   WBC 13.7 (H) 08/03/2018   HGB 8.8 (L) 08/03/2018   HCT 26.9 (L) 08/03/2018   MCV 76.0 (L) 08/03/2018   PLT 384 08/03/2018   NEUTROABS 9.7 (H) 07/31/2018   Lab Results  Component Value Date   NA 135 08/03/2018   K 4.7 08/03/2018   CL 97 (L) 08/03/2018   CO2 26 08/03/2018   GLUCOSE 377 (H) 08/03/2018   CREATININE 1.10 (H) 08/03/2018   CALCIUM 8.1 (L) 08/03/2018      RADIOGRAPHY: Dg Chest 2 View  Result Date: 07/28/2018 CLINICAL DATA:  Hemoptysis EXAM: CHEST - 2 VIEW COMPARISON:  04/06/2018 FINDINGS: New mass lesion right middle lobe measuring approximately 3 cm. Dual lead pacemaker. Heart size upper normal. Negative for heart failure. No effusion. Mild hyperinflation of the lungs IMPRESSION: 3 cm right middle lobe mass, new since  04/06/2018. This is concerning for neoplasm however given its rapid development could be an area of infection. Followup PA and lateral chest X-ray is recommended in 3-4 weeks following trial of antibiotic therapy to ensure resolution and exclude underlying malignancy. Electronically Signed   By: Franchot Gallo M.D.   On: 07/28/2018 10:59   Ct Angio Chest Pe W And/or Wo Contrast  Result Date: 07/28/2018 CLINICAL DATA:  hemoptysis. +weakness, cough and decrease in PO intake x 3 weeks EXAM: CT ANGIOGRAPHY CHEST WITH CONTRAST TECHNIQUE: Multidetector CT imaging of the chest was performed using the standard protocol during bolus administration of intravenous contrast. Multiplanar CT image reconstructions and MIPs were obtained to evaluate the vascular anatomy. CONTRAST:  187mL ISOVUE-370 IOPAMIDOL (ISOVUE-370) INJECTION 76% COMPARISON:  02/02/2017 FINDINGS: Cardiovascular:  Left subclavian dual lead transvenous pacemaker, leads extending into the right atrium and towards the right ventricular apex. Heart size normal. No pericardial effusion. Satisfactory opacification of pulmonary arteries noted, and there is no evidence of pulmonary emboli. Coronary calcifications. There is good contrast opacification of the thoracic aorta. There is eccentric aneurysmal dilatation of the distal arch up to 3.8 cm diameter, aorta returning to normal caliber of 3 cm in the proximal descending segment. Moderate scattered calcified atheromatous plaque in the arch and descending thoracic segment. There is some eccentric nonocclusive mural thrombus in the visualized proximal abdominal aorta. Tines of infrarenal stent graft are partially visualized. Classic 3 vessel brachiocephalic arterial origin anatomy without proximal stenosis. Mediastinum/Nodes: No hilar or mediastinal adenopathy. Lungs/Pleura: No pleural effusion. No pneumothorax. Pulmonary emphysema (ICD10-J43.9). 4 cm spiculated mass in the right middle lobe, previously 1 cm. 2  subpleural satellite nodules measuring 6 and 10 mm are relatively stable. Smaller nonspecific nodules in the posterior upper lobes are stable. Upper Abdomen: Cholecystectomy clips. Stable bilateral adrenal fullness. No acute findings. Musculoskeletal: Old healed sternal fracture. No acute fracture or worrisome bone lesion. Review of the MIP images confirms the above findings. IMPRESSION: 1. Negative for acute PE or thoracic aortic dissection. 2. Enlarging 4 cm right middle lobe mass suggesting primary bronchogenic carcinoma. Consider pulmonary/thoracic consultation. 3. Coronary and Aortic Atherosclerosis (ICD10-170.0). 4. 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 Recommend annual imaging followup by CTA or MRA. This recommendation follows 2010 ACCF/AHA/AATS/ACR/ASA/SCA/SCAI/SIR/STS/SVM Guidelines for the Diagnosis and Management of Patients with Thoracic Aortic Disease. Circulation.2010; 121: W299-B716 Electronically Signed   By: Lucrezia Europe M.D.   On: 07/28/2018 17:03   Dg C-arm Bronchoscopy  Result Date: 08/02/2018 C-ARM BRONCHOSCOPY: Fluoroscopy was utilized by the requesting physician.  No radiographic interpretation.      IMPRESSION: Adenocarcinoma presenting in the middle lobe of the right lung.  Patient presented with significant hemoptysis but this has slowed down recently.  Patient will be a candidate for radiation therapy to treat her lesion.  Discussed potential medical oncology evaluation with the patient and her family members present today.  Given the patient's overall performance status they do not even wish to consider radiosensitizing chemotherapy as part of her management.  In light of the patient's performance status I discussed a palliative dose of radiation (2 weeks) versus a more definitive course of radiation therapy over 6 weeks.  The patient's family does wish to proceed with a PET scan to evaluate whether there is signs of metastatic disease.  In light of this I have ordered  a PET scan for her evaluation.  Looking at her performance status today I doubt she was able to tolerate an aggressive course of radiation therapy over 6 to 6-1/2 weeks of treatment.  With her dementia I am unsure if she would be able to hold still for her radiation treatment.  Today, I talked to the patient and family about the findings and work-up thus far.  We discussed the natural history of non-small cell lung cancer and general treatment, highlighting the role of radiotherapy in the management.  We discussed the available radiation techniques, and focused on the details of logistics and delivery.  We reviewed the anticipated acute and late sequelae associated with radiation in this setting.  The patient was encouraged to ask questions that I answered to the best of my ability.   The patient would like to proceed with radiation and will be scheduled for CT simulation.  PLAN: Patient will  be set up for CT simulation early next week with treatment to begin soon after.  Final treatment plans will depend on results of PET scan and patient's tolerance to her initial course of radiation treatment.     ------------------------------------------------  Blair Promise, PhD, MD  This document serves as a record of services personally performed by Gery Pray, MD. It was created on his behalf by Rae Lips, a trained medical scribe. The creation of this record is based on the scribe's personal observations and the provider's statements to them. This document has been checked and approved by the attending provider.

## 2018-08-10 NOTE — Telephone Encounter (Signed)
Copied from [facility] [phone]. Topic: Quick Communication - Rx Refill/Question >> Aug 10, 2018 11:59 AM Sheran Luz wrote: Medication:  lovastatin (MEVACOR) 20 MG tablet   Patient is requesting a refill of this medication.   Preferred Pharmacy (with phone number or street name):[id number] - [place], [place] [phone] (Phone) [phone] (Fax)

## 2018-08-11 NOTE — Telephone Encounter (Signed)
Thank you :)

## 2018-08-11 NOTE — Telephone Encounter (Signed)
Patient was seen with Dr. Sondra Come ([place]) yesterday

## 2018-08-14 NOTE — Telephone Encounter (Signed)
LM for patient to return call for TCM. 

## 2018-08-16 ENCOUNTER — Telehealth: Payer: Self-pay | Admitting: Radiology

## 2018-08-16 ENCOUNTER — Ambulatory Visit (INDEPENDENT_AMBULATORY_CARE_PROVIDER_SITE_OTHER): Payer: Medicare Other | Admitting: Family Medicine

## 2018-08-16 ENCOUNTER — Encounter: Payer: Self-pay | Admitting: Family Medicine

## 2018-08-16 ENCOUNTER — Ambulatory Visit (INDEPENDENT_AMBULATORY_CARE_PROVIDER_SITE_OTHER): Payer: Medicare Other | Admitting: General Practice

## 2018-08-16 VITALS — BP 112/60 | Temp 97.8°F | Ht 65.0 in | Wt 135.0 lb

## 2018-08-16 DIAGNOSIS — E785 Hyperlipidemia, unspecified: Secondary | ICD-10-CM

## 2018-08-16 DIAGNOSIS — I10 Essential (primary) hypertension: Secondary | ICD-10-CM

## 2018-08-16 DIAGNOSIS — C342 Malignant neoplasm of middle lobe, bronchus or lung: Secondary | ICD-10-CM

## 2018-08-16 DIAGNOSIS — E1159 Type 2 diabetes mellitus with other circulatory complications: Secondary | ICD-10-CM

## 2018-08-16 DIAGNOSIS — E1122 Type 2 diabetes mellitus with diabetic chronic kidney disease: Secondary | ICD-10-CM

## 2018-08-16 DIAGNOSIS — E1169 Type 2 diabetes mellitus with other specified complication: Secondary | ICD-10-CM

## 2018-08-16 DIAGNOSIS — N183 Chronic kidney disease, stage 3 unspecified: Secondary | ICD-10-CM

## 2018-08-16 DIAGNOSIS — I482 Chronic atrial fibrillation, unspecified: Secondary | ICD-10-CM | POA: Diagnosis not present

## 2018-08-16 DIAGNOSIS — Z7901 Long term (current) use of anticoagulants: Secondary | ICD-10-CM | POA: Diagnosis not present

## 2018-08-16 DIAGNOSIS — J432 Centrilobular emphysema: Secondary | ICD-10-CM

## 2018-08-16 DIAGNOSIS — I152 Hypertension secondary to endocrine disorders: Secondary | ICD-10-CM

## 2018-08-16 DIAGNOSIS — M542 Cervicalgia: Secondary | ICD-10-CM

## 2018-08-16 LAB — COMPREHENSIVE METABOLIC PANEL
ALT: 25 U/L (ref 0–35)
AST: 23 U/L (ref 0–37)
Albumin: 2.4 g/dL — ABNORMAL LOW (ref 3.5–5.2)
Alkaline Phosphatase: 127 U/L — ABNORMAL HIGH (ref 39–117)
BUN: 23 mg/dL (ref 6–23)
CO2: 28 mEq/L (ref 19–32)
Calcium: 7.8 mg/dL — ABNORMAL LOW (ref 8.4–10.5)
Chloride: 97 mEq/L (ref 96–112)
Creatinine, Ser: 1.07 mg/dL (ref 0.40–1.20)
GFR: 59.04 mL/min — ABNORMAL LOW (ref >60.00)
Glucose, Bld: 251 mg/dL — ABNORMAL HIGH (ref 70–99)
POTASSIUM: 4.1 meq/L (ref 3.5–5.1)
Sodium: 138 mEq/L (ref 135–145)
Total Bilirubin: 0.4 mg/dL (ref 0.2–1.2)
Total Protein: 6.1 g/dL (ref 6.0–8.3)

## 2018-08-16 LAB — PROTIME-INR
INR: 6.8 ratio — AB (ref 0.8–1.0)
Prothrombin Time: 76.5 s (ref 9.6–13.1)

## 2018-08-16 LAB — POCT INR: INR: 8 — AB (ref 2.0–3.0)

## 2018-08-16 NOTE — Assessment & Plan Note (Signed)
At goal.  Continue Dyazide, metoprolol tartrate 100 mg twice daily, and losartan 25 mg daily.

## 2018-08-16 NOTE — Assessment & Plan Note (Signed)
Defer management to radiation oncology and pulmonology.

## 2018-08-16 NOTE — Telephone Encounter (Signed)
CRITICAL VALUE STICKER  CRITICAL VALUE: PT - 76.5   INR - 6.78  RECEIVER (on-site recipient of call): Kayren Eaves, RT(R)   DATE & TIME NOTIFIED: 08/16/2018 @ 1200  MESSENGER (representative from lab):Earnest Bailey  MD NOTIFIED: Dimas Chyle   TIME OF NOTIFICATION: 08/16/2018 @ 1200  RESPONSE:

## 2018-08-16 NOTE — Telephone Encounter (Signed)
Patient daughter notified

## 2018-08-16 NOTE — Assessment & Plan Note (Signed)
Continue lovastatin 20 mg daily.  Discussed stopping given her recent cancer diagnosis, however she is otherwise tolerating without side effects.  We will continue for the time being.

## 2018-08-16 NOTE — Telephone Encounter (Signed)
Noted. See result note.  Alejandra [name]. [name], MD 08/16/2018 12:06 PM

## 2018-08-16 NOTE — Patient Instructions (Signed)
It was very nice to see you today!  We will check blood work today.  We need to make sure that your Coumadin level is not staying too high.  Please have it rechecked again next week.  Please go to the emergency department if you have any bleeding at all.  No medication changes today.  Come back to see me in 3 to 6 months, or sooner as needed.  Take care, Dr [name]

## 2018-08-16 NOTE — Assessment & Plan Note (Signed)
Sugars have been slightly elevated in setting of recent hospitalization and illness.  We will continue metformin 500 mg twice daily and glipizide 5 mg twice daily.  Check A1c in 3 to 6 months.

## 2018-08-16 NOTE — Progress Notes (Signed)
Subjective:  Alejandra [name] is a 85 y.o. female who presents today for a TCM visit.  HPI:  Summary of Hospital admission: Reason for admission: Mass of right middle lung Date of admission: 07/28/2018 Date of discharge: 08/03/2018 Date of Interactive contact: 08/09/2018 Summary of Hospital course: Patient presented to the ED with 2 to 3 weeks of hemoptysis after being evaluated in this office and found to have a new right middle lung mass on her chest x-ray.  Pulmonology was consulted and she underwent bronchoscopy on 08/02/2018 with biopsy performed.  She was discharged home in stable condition with follow-up scheduled with oncology and pulmonology.  Interim History outlined by problem:   # Right Middle Lung Mass Patient saw radiation oncology last week. Biopsy confirmed non-small cell carcinoma. They are currently considering palliative radiation.  She has an appointment tomorrow with oncology and an appointment next week with pulmonology.  Will be undergoing PET scan.  Overall, feels like her symptoms are stable.  She has some decreased appetite which is stable.  No further episodes of hemoptysis.  # Atrial fibrillation anticoagulated on warfarin -Follows with cardiology and Coumadin clinic. -INR last week was 8.  She is adamant that she is not taking any Coumadin over the last week.  # COPD on chronic oxygen - Currently on spiriva and symbicort. Uses albuterol as needed -Follows with pulmonology.  # Essential Hypertension - Currently on Dyazide 1 capsule daily, metoprolol tartrate 100 mg twice daily and losartan 25 mg daily. - ROS: No chest pain or shortness of breath.  # Dyslipidemia -Currently on lovastatin 20 mg daily and tolerating well - ROS: No myalgias  # T2DM - Currently on glipizide 64m twice daily and metformin 5083mtwice daily - ROS: No polyuria or polydipsia  # GERD - On protonix 4073maily and tolerating well  % CKD3  ROS: Per HPI, otherwise a complete  review of systems was negative.   PMH:  The following were reviewed and entered/updated in epic: Past Medical History:  Diagnosis Date  . AAA (abdominal aortic aneurysm) ([facility] . Arthritis   . COPD (chronic obstructive pulmonary disease) ([place]CC[place] . DM (diabetes mellitus) ([place]CC[place] . Dyslipidemia   . Hyperlipidemia   . Hypertension   . Left foot infection 2013  . 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 2013  . [facility]  Patient Active Problem List   Diagnosis Date Noted  . Primary adenocarcinoma of middle lobe of right lung (HCCL[place]1/23/2020  . Thoracic aortic aneurysm (HCCRiverside1/16/2020  . Malnutrition of moderate degree 07/31/2018  . Oxygen dependent 07/28/2018  . Psoriasis 01/04/2018  . Osteoarthritis 11/03/2017  . Type 2 diabetes mellitus with stage 3 chronic kidney disease, without long-term current use of insulin (HCCPing[place]3/13/2019  . Syncope 02/02/2017  . Atrial fibrillation, chronic   . Anticoagulated on Coumadin 10/09/2016  . Neck pain 12/11/2015  . CKD stage 3 secondary to diabetes (HCCTex[place]2/20/2016  . Swelling of limb-Right  Leg 11/30/2013  . Aneurysm of abdominal vessel (HCCP[place]5/13/2013  . Hypertension associated with diabetes (HCC[place]4/06/2012  . Hyperlipidemia associated with type 2 diabetes mellitus (HCCHolly Lake Ranch4/06/2012  . COPD (chronic obstructive pulmonary disease) (HCCRonco7/29/2011   Past Surgical History:  Procedure Laterality Date  . ABDOMINAL AORTIC ANEURYSM REPAIR  10-30-2011  . CHOLECYSTECTOMY N/A 10/14/2016   Procedure: LAPAROSCOPIC CHOLECYSTECTOMY;  Surgeon: CheClovis RileyD;  [facility];  Laterality: N/A;  . ENDOVASCULAR STENT  INSERTION  10/30/2011   Procedure: ENDOVASCULAR STENT GRAFT INSERTION;  Surgeon: Serafina Mitchell, MD;  Location: [place];  Service: Vascular;  Laterality: N/A;  . PACEMAKER IMPLANT N/A 02/04/2017   Procedure: Pacemaker Implant;  Surgeon: Constance Haw, MD;  Location: [place] [facility];  Service: Cardiovascular;   Laterality: N/A;  . VESICOVAGINAL FISTULA CLOSURE W/ TAH    . VIDEO BRONCHOSCOPY WITH ENDOBRONCHIAL NAVIGATION N/A 08/02/2018   Procedure: VIDEO BRONCHOSCOPY WITH ENDOBRONCHIAL NAVIGATION;  Surgeon: Margaretha Seeds, MD;  Location: [place];  Service: Thoracic;  Laterality: N/A;    Family History  Problem Relation Age of Onset  . Heart disease Mother   . Hypertension Mother   . Heart attack Mother   . Hypertension Father   . Diabetes Father   . Heart attack Father   . Heart disease Sister        x 3  . Hypertension Sister   . Heart attack Sister   . Clotting disorder Daughter   . Clotting disorder Son        multiple sons  . Diabetes Son   . Hypertension Brother     Medications- Reconciled discharge and current medications in Epic.  Current Outpatient Medications  Medication Sig Dispense Refill  . acetaminophen (TYLENOL) 500 MG tablet Take 1,000 mg by mouth 2 (two) times daily as needed for mild pain.    Marland Kitchen adapalene (DIFFERIN) 0.1 % gel APPLY TO SCALP PSORIASIS ONCE DAILY (Patient taking differently: Apply 1 application topically daily. ) 45 g 0  . albuterol (PROVENTIL HFA;VENTOLIN HFA) 108 (90 Base) MCG/ACT inhaler Inhale 2 puffs into the lungs every 6 (six) hours as needed for wheezing or shortness of breath. 1 Inhaler 0  . budesonide-formoterol (SYMBICORT) 80-4.5 MCG/ACT inhaler Inhale 2 puffs into the lungs 2 (two) times daily. 1 Inhaler 3  . Cholecalciferol (VITAMIN D3) 1000 UNITS CAPS Take 1 capsule by mouth daily.      Marland Kitchen dextromethorphan-guaiFENesin (MUCINEX DM) 30-600 MG 12hr tablet Take 1 tablet by mouth 2 (two) times daily. 30 tablet 0  . Garlic 5009 MG CAPS Take 1,000 mg by mouth daily.     Marland Kitchen glipiZIDE (GLUCOTROL) 5 MG tablet TAKE 1 TABLET(5 MG) BY MOUTH TWICE DAILY BEFORE A MEAL (Patient taking differently: Take 5 mg by mouth 2 (two) times daily before a meal. ) 180 tablet 0  . losartan (COZAAR) 25 MG tablet Take 1 tablet (25 mg total) by mouth daily. 90 tablet 1  .  lovastatin (MEVACOR) 20 MG tablet Take 1 tablet (20 mg total) by mouth at bedtime. 90 tablet 1  . metFORMIN (GLUCOPHAGE) 500 MG tablet TAKE 1 TABLET(500 MG) BY MOUTH TWICE DAILY WITH A MEAL (Patient taking differently: Take 500 mg by mouth 2 (two) times daily with a meal. ) 180 tablet 0  . Multiple Vitamin (MULTIVITAMIN) tablet Take 1 tablet by mouth daily.      Marland Kitchen omega-3 acid ethyl esters (LOVAZA) 1 G capsule Take 2 g by mouth 2 (two) times daily.    . pantoprazole (PROTONIX) 40 MG tablet Take 1 tablet (40 mg total) by mouth daily. 30 tablet 1  . Tazarotene 0.1 % FOAM Apply to scalp psoriasis 1 time daily. (Patient taking differently: Apply topically daily. Apply to scalp psoriasis) 50 g 1  . Tiotropium Bromide Monohydrate (SPIRIVA RESPIMAT) 2.5 MCG/ACT AERS Inhale 2 puffs into the lungs daily. 4 g 11  . triamcinolone ointment (KENALOG) 0.5 % APPLY TOPICALLY TO THE AFFECTED AREA  TWICE DAILY (Patient taking differently: Apply 1 application topically 2 (two) times daily. ) 60 g 0  . triamterene-hydrochlorothiazide (DYAZIDE) 37.5-25 MG capsule Take 1 each (1 capsule total) by mouth daily. 90 capsule 3  . metoprolol tartrate (LOPRESSOR) 100 MG tablet Take 1 tablet (100 mg total) by mouth 2 (two) times daily. 180 tablet 3  . warfarin (COUMADIN) 5 MG tablet Take 1-1.5 tablets (5-7.5 mg total) by mouth See admin instructions. Take 1 and 1/2 tablets on Monday then take 1 tablet the other days (Patient not taking: Reported on 08/16/2018) 45 tablet 5   No current facility-administered medications for this visit.     Allergies-reviewed and updated Allergies  Allergen Reactions  . Penicillins Nausea And Vomiting, Rash and Other (See Comments)    Childhood reaction, tolerated multiple cephs in 2013. Has patient had a PCN reaction causing immediate rash, facial/tongue/throat swelling, SOB or lightheadedness with hypotension: Yes Has patient had a PCN reaction causing severe rash involving mucus membranes or  skin necrosis: Yes Has patient had a PCN reaction that required hospitalization: Yes Has patient had a PCN reaction occurring within the last 10 years: No     Social History   Socioeconomic History  . Marital status: Widowed    Spouse name: Not on file  . Number of children: 2  . Years of education: 47  . Highest education level: Not on file  Occupational History  . Occupation: retired    Comment: Building surveyor  Social Needs  . Financial resource strain: Not on file  . Food insecurity:    Worry: Not on file    Inability: Not on file  . Transportation needs:    Medical: Not on file    Non-medical: Not on file  Tobacco Use  . Smoking status: Former Smoker    Packs/day: 0.50    Years: 54.00    Pack years: 27.00    Types: Cigarettes    Last attempt to quit: 01/23/2010    Years since quitting: 8.5  . Smokeless tobacco: Never Used  Substance and Sexual Activity  . Alcohol use: No    Alcohol/week: 0.0 standard drinks  . Drug use: No  . Sexual activity: Not on file  Lifestyle  . Physical activity:    Days per week: Not on file    Minutes per session: Not on file  . Stress: Not on file  Relationships  . Social connections:    Talks on phone: Not on file    Gets together: Not on file    Attends religious service: Not on file    Active member of club or organization: Not on file    Attends meetings of clubs or organizations: Not on file    Relationship status: Not on file  Other Topics Concern  . Not on file  Social History Narrative   Fun: Goes to church, Watch TV   Denies religious beliefs affecting health care.     Objective:  Physical Exam: BP 112/60 (BP Location: Left Arm, Patient Position: Sitting, Cuff Size: Normal)   Temp 97.8 F (36.6 C) (Oral)   Ht 5' 5"  (1.651 m)   Wt 135 lb (61.2 kg)   BMI 22.47 kg/m   Gen: NAD, resting comfortably in wheelchair.  Nasal cannula in place. CV: RRR with no murmurs appreciated Pulm: NWOB, CTAB with no crackles,  wheezes, or rhonchi.  Nasal cannula in place. GI: Normal bowel sounds present. Soft, Nontender, Nondistended. MSK: No edema, cyanosis, or clubbing noted  Skin: Warm, dry Neuro: Grossly normal, moves all extremities Psych: Normal affect and thought content  Assessment/Plan:  Hypertension associated with diabetes ([place]) At goal.  Continue Dyazide, metoprolol tartrate 100 mg twice daily, and losartan 25 mg daily.  Hyperlipidemia associated with type 2 diabetes mellitus ([name]) Continue lovastatin 20 mg daily.  Discussed stopping given her recent cancer diagnosis, however she is otherwise tolerating without side effects.  We will continue for the time being.  CKD stage 3 secondary to diabetes ([place]) Check C met with blood draw.  COPD (chronic obstructive pulmonary disease) ([name]) Stable on 4 L nasal cannula.  Continue Spiriva and Symbicort per pulmonology.  Atrial fibrillation, chronic Plan of care INR significantly elevated today to 8.  Will check stat send out INR.  She does not have any signs of bleeding.  We will continue to follow.  Discussed strict return precautions and ER precautions.  Primary adenocarcinoma of middle lobe of right lung [facility]) Defer management to radiation oncology and pulmonology.  Type 2 diabetes mellitus with stage 3 chronic kidney disease, without long-term current use of insulin ([name]) Sugars have been slightly elevated in setting of recent hospitalization and illness.  We will continue metformin 500 mg twice daily and glipizide 5 mg twice daily.  Check A1c in 3 to 6 months.  Neck pain Chronic problem for patient.  No red flags.  She requested neck support brace today.  DME order was written for a soft cervical collar to hopefully help some with her symptoms.  Offered referral to sports medicine or orthopedics however patient declined.  Algis Greenhouse. [name], MD 08/16/2018 12:05 PM

## 2018-08-16 NOTE — Assessment & Plan Note (Signed)
Plan of care INR significantly elevated today to 8.  Will check stat send out INR.  She does not have any signs of bleeding.  We will continue to follow.  Discussed strict return precautions and ER precautions.

## 2018-08-16 NOTE — Patient Instructions (Addendum)
Pre visit review using our clinic review tool, if applicable. No additional management support is needed unless otherwise documented below in the visit note.  INR resulted at 6.8 via lab draw.  Patient and daughter reports that patient has not had coumadin since 1/21. Sent patient to the lab.  Stop coumadin!  Re-check Tuesday 2/4. Go to ER if any unusual bleeding occurs.  Reported lab value to daughter, Mateo Flow.

## 2018-08-16 NOTE — Assessment & Plan Note (Signed)
Stable on 4 L nasal cannula.  Continue Spiriva and Symbicort per pulmonology.

## 2018-08-16 NOTE — Assessment & Plan Note (Signed)
Check C met with blood draw. 

## 2018-08-16 NOTE — Progress Notes (Signed)
I have reviewed the patient's encounter and agree with the documentation.  Algis Greenhouse. [name], MD 08/16/2018 10:45 AM

## 2018-08-16 NOTE — Assessment & Plan Note (Signed)
Chronic problem for patient.  No red flags.  She requested neck support brace today.  DME order was written for a soft cervical collar to hopefully help some with her symptoms.  Offered referral to sports medicine or orthopedics however patient declined.

## 2018-08-16 NOTE — Progress Notes (Signed)
Please inform patient of the following:  Her INR is elevated to 6.8. Recommend holding all warfarin until next week as we discussed. Needs to seek medical care if she develops any signs of bleeding.  Algis Greenhouse. [name], MD 08/16/2018 12:06 PM

## 2018-08-17 ENCOUNTER — Encounter: Payer: Self-pay | Admitting: *Deleted

## 2018-08-17 ENCOUNTER — Ambulatory Visit
Admission: RE | Admit: 2018-08-17 | Discharge: 2018-08-17 | Disposition: A | Discharge status: Home / Self Care | Payer: Medicare Other | Source: Ambulatory Visit | Attending: Radiation Oncology | Admitting: Radiation Oncology

## 2018-08-17 DIAGNOSIS — Z510 Encounter for antineoplastic radiation therapy: Secondary | ICD-10-CM | POA: Diagnosis not present

## 2018-08-17 DIAGNOSIS — C342 Malignant neoplasm of middle lobe, bronchus or lung: Secondary | ICD-10-CM

## 2018-08-17 NOTE — Progress Notes (Signed)
Please inform patient of the following:  Blood work is stable. Do not need to make any changes to her treatment plan at this time. Would like for her to have her INR checked next week as we discussed.  Algis Greenhouse. [name], MD 08/17/2018 10:43 AM

## 2018-08-17 NOTE — Progress Notes (Signed)
  Radiation Oncology         [phone] ________________________________  Name: Alejandra [name] MRN: [id number]  Date: 08/17/2018  DOB: 08/16/1931  SIMULATION AND TREATMENT PLANNING NOTE    ICD-10-CM   1. Primary adenocarcinoma of middle lobe of right lung ([name]) C34.2     DIAGNOSIS:  85 y.o. female with Adenocarcinoma presenting in the middle lobe of the right lung  NARRATIVE:  The patient was brought to the [facility].  Identity was confirmed.  All relevant records and images related to the planned course of therapy were reviewed.  The patient freely provided informed written consent to proceed with treatment after reviewing the details related to the planned course of therapy. The consent form was witnessed and verified by the simulation staff.  Then, the patient was set-up in a stable reproducible  supine position for radiation therapy.  CT images were obtained.  Surface markings were placed.  The CT images were loaded into the planning software.  Then the target and avoidance structures were contoured.  Treatment planning then occurred.  The radiation prescription was entered and confirmed.  Then, I designed and supervised the construction of a total of 5 medically necessary complex treatment devices.  I have requested : 3D Simulation  I have requested a DVH of the following structures: GTV, PTV, spinal cord, heart, lungs.  I have ordered:dose calc.  PLAN:  The patient will receive 64 Gy in 32 fractions directed at the right middle lobe lung mass. Treatment plan could change depending on her upcoming PET scan. She is not a candidate for radiosensitizing chemotherapy in light of her medical issues and performance status.  -----------------------------------  Blair Promise, PhD, MD  This document serves as a record of services personally performed by Gery Pray, MD. It was created on his behalf by Rae Lips, a trained medical scribe. The creation of this record is  based on the scribe's personal observations and the provider's statements to them. This document has been checked and approved by the attending provider.

## 2018-08-17 NOTE — Progress Notes (Signed)
[place] Psychosocial Distress Screening Clinical Social Work  Clinical Social Work was referred by distress screening protocol.  The patient scored a 5 on the Psychosocial Distress Thermometer which indicates moderate distress. Clinical Social Worker contacted patient's daughter to assess for distress and other psychosocial needs. CSW also met with patient/family after CT SIM today.  Patient was accompanied by daughter, niece, and grandson.  Her main concern is transportation at this time- CSW connected patietn/family with transportation coordinator Ginette Otto.  CSW provided patietn with [employer] card to purchase food/glucerna supplements.  ONCBCN DISTRESS SCREENING 08/10/2018  Screening Type Initial Screening  Distress experienced in past week (1-10) 5  Practical problem type Food  Physical Problem type Pain;Loss of appetitie;Skin dry/itchy    Clinical Social Worker follow up needed: No.  If yes, follow up plan:  Gwinda Maine, LCSW

## 2018-08-22 ENCOUNTER — Ambulatory Visit (INDEPENDENT_AMBULATORY_CARE_PROVIDER_SITE_OTHER): Payer: Medicare Other | Admitting: General Practice

## 2018-08-22 DIAGNOSIS — Z7901 Long term (current) use of anticoagulants: Secondary | ICD-10-CM

## 2018-08-22 LAB — POCT INR: INR: 3 (ref 2.0–3.0)

## 2018-08-22 NOTE — Patient Instructions (Addendum)
Pre visit review using our clinic review tool, if applicable. No additional management support is needed unless otherwise documented below in the visit note.  Hold dosage today and then start taking 1/2 tablet daily except 1 tablet on Monday Wednesday and Fridays.  Re-check in 2 weeks.

## 2018-08-22 NOTE — Telephone Encounter (Signed)
ERROR

## 2018-08-23 ENCOUNTER — Emergency Department (HOSPITAL_COMMUNITY): Payer: Medicare Other

## 2018-08-23 ENCOUNTER — Observation Stay (HOSPITAL_COMMUNITY)
Admission: EM | Admit: 2018-08-23 | Discharge: 2018-08-25 | Disposition: A | Discharge status: Home / Self Care | Payer: Medicare Other | Attending: Internal Medicine | Admitting: Internal Medicine

## 2018-08-23 DIAGNOSIS — E1159 Type 2 diabetes mellitus with other circulatory complications: Secondary | ICD-10-CM | POA: Diagnosis present

## 2018-08-23 DIAGNOSIS — Z86711 Personal history of pulmonary embolism: Secondary | ICD-10-CM | POA: Diagnosis not present

## 2018-08-23 DIAGNOSIS — E785 Hyperlipidemia, unspecified: Secondary | ICD-10-CM | POA: Diagnosis not present

## 2018-08-23 DIAGNOSIS — D72829 Elevated white blood cell count, unspecified: Secondary | ICD-10-CM | POA: Insufficient documentation

## 2018-08-23 DIAGNOSIS — R52 Pain, unspecified: Secondary | ICD-10-CM | POA: Diagnosis present

## 2018-08-23 DIAGNOSIS — E1122 Type 2 diabetes mellitus with diabetic chronic kidney disease: Secondary | ICD-10-CM | POA: Insufficient documentation

## 2018-08-23 DIAGNOSIS — J449 Chronic obstructive pulmonary disease, unspecified: Secondary | ICD-10-CM | POA: Diagnosis not present

## 2018-08-23 DIAGNOSIS — I712 Thoracic aortic aneurysm, without rupture: Secondary | ICD-10-CM | POA: Diagnosis not present

## 2018-08-23 DIAGNOSIS — D62 Acute posthemorrhagic anemia: Secondary | ICD-10-CM | POA: Diagnosis not present

## 2018-08-23 DIAGNOSIS — D509 Iron deficiency anemia, unspecified: Secondary | ICD-10-CM | POA: Insufficient documentation

## 2018-08-23 DIAGNOSIS — Z66 Do not resuscitate: Secondary | ICD-10-CM | POA: Diagnosis not present

## 2018-08-23 DIAGNOSIS — Z87891 Personal history of nicotine dependence: Secondary | ICD-10-CM | POA: Insufficient documentation

## 2018-08-23 DIAGNOSIS — Z79899 Other long term (current) drug therapy: Secondary | ICD-10-CM | POA: Diagnosis not present

## 2018-08-23 DIAGNOSIS — Z7901 Long term (current) use of anticoagulants: Secondary | ICD-10-CM

## 2018-08-23 DIAGNOSIS — I152 Hypertension secondary to endocrine disorders: Secondary | ICD-10-CM | POA: Diagnosis present

## 2018-08-23 DIAGNOSIS — Z515 Encounter for palliative care: Secondary | ICD-10-CM

## 2018-08-23 DIAGNOSIS — J9611 Chronic respiratory failure with hypoxia: Secondary | ICD-10-CM | POA: Diagnosis not present

## 2018-08-23 DIAGNOSIS — Z9981 Dependence on supplemental oxygen: Secondary | ICD-10-CM | POA: Diagnosis not present

## 2018-08-23 DIAGNOSIS — I482 Chronic atrial fibrillation, unspecified: Secondary | ICD-10-CM | POA: Diagnosis not present

## 2018-08-23 DIAGNOSIS — I129 Hypertensive chronic kidney disease with stage 1 through stage 4 chronic kidney disease, or unspecified chronic kidney disease: Secondary | ICD-10-CM | POA: Diagnosis not present

## 2018-08-23 DIAGNOSIS — N183 Chronic kidney disease, stage 3 (moderate): Secondary | ICD-10-CM | POA: Insufficient documentation

## 2018-08-23 DIAGNOSIS — C342 Malignant neoplasm of middle lobe, bronchus or lung: Principal | ICD-10-CM | POA: Insufficient documentation

## 2018-08-23 DIAGNOSIS — I714 Abdominal aortic aneurysm, without rupture: Secondary | ICD-10-CM | POA: Diagnosis not present

## 2018-08-23 DIAGNOSIS — Z7189 Other specified counseling: Secondary | ICD-10-CM

## 2018-08-23 DIAGNOSIS — R109 Unspecified abdominal pain: Secondary | ICD-10-CM

## 2018-08-23 DIAGNOSIS — Z7951 Long term (current) use of inhaled steroids: Secondary | ICD-10-CM | POA: Diagnosis not present

## 2018-08-23 DIAGNOSIS — D649 Anemia, unspecified: Secondary | ICD-10-CM

## 2018-08-23 DIAGNOSIS — I10 Essential (primary) hypertension: Secondary | ICD-10-CM

## 2018-08-23 LAB — URINALYSIS, ROUTINE W REFLEX MICROSCOPIC
BILIRUBIN URINE: NEGATIVE
Glucose, UA: NEGATIVE mg/dL
Hgb urine dipstick: NEGATIVE
KETONES UR: NEGATIVE mg/dL
Leukocytes, UA: NEGATIVE
Nitrite: NEGATIVE
Protein, ur: 30 mg/dL — AB
Specific Gravity, Urine: 1.021 (ref 1.005–1.030)
pH: 5 (ref 5.0–8.0)

## 2018-08-23 LAB — I-STAT TROPONIN, ED: Troponin i, poc: 0.02 ng/mL (ref 0.00–0.08)

## 2018-08-23 LAB — COMPREHENSIVE METABOLIC PANEL
ALT: 21 U/L (ref 0–44)
AST: 24 U/L (ref 15–41)
Albumin: 1.6 g/dL — ABNORMAL LOW (ref 3.5–5.0)
Alkaline Phosphatase: 106 U/L (ref 38–126)
Anion gap: 11 (ref 5–15)
BUN: 26 mg/dL — AB (ref 8–23)
CO2: 30 mmol/L (ref 22–32)
Calcium: 8.3 mg/dL — ABNORMAL LOW (ref 8.9–10.3)
Chloride: 98 mmol/L (ref 98–111)
Creatinine, Ser: 1.27 mg/dL — ABNORMAL HIGH (ref 0.44–1.00)
GFR calc Af Amer: 45 mL/min — ABNORMAL LOW (ref >60)
GFR calc non Af Amer: 39 mL/min — ABNORMAL LOW (ref >60)
Glucose, Bld: 254 mg/dL — ABNORMAL HIGH (ref 70–99)
Potassium: 4.5 mmol/L (ref 3.5–5.1)
Sodium: 139 mmol/L (ref 135–145)
Total Bilirubin: 0.6 mg/dL (ref 0.3–1.2)
Total Protein: 6.9 g/dL (ref 6.5–8.1)

## 2018-08-23 LAB — PROTIME-INR
INR: 2.97
Prothrombin Time: 30.5 seconds — ABNORMAL HIGH (ref 11.4–15.2)

## 2018-08-23 LAB — LIPASE, BLOOD: Lipase: 20 U/L (ref 11–51)

## 2018-08-23 MED ORDER — OXYCODONE HCL 5 MG PO TABS
5.0000 mg | ORAL_TABLET | Freq: Once | ORAL | Status: AC
  Administered 2018-08-23: 5 mg via ORAL
  Filled 2018-08-23: qty 1

## 2018-08-23 MED ORDER — ONDANSETRON 4 MG PO TBDP
4.0000 mg | ORAL_TABLET | Freq: Once | ORAL | Status: AC
  Administered 2018-08-23: 4 mg via ORAL
  Filled 2018-08-23: qty 1

## 2018-08-23 NOTE — ED Triage Notes (Signed)
Poor historian. Pt reports stomach pain, side pain "for a while" Denies N/V/D.

## 2018-08-23 NOTE — ED Provider Notes (Signed)
[facility] EMERGENCY DEPARTMENT Provider Note   CSN: [id number] Arrival date & time: 08/23/18  2035     History   Chief Complaint Chief Complaint  Patient presents with  . Abdominal Pain    HPI Alejandra [name] is a 85 y.o. female.  The history is provided by the patient.  Abdominal Pain  Pain location:  Generalized Pain quality: aching and dull   Pain radiates to:  Does not radiate Pain severity:  Moderate Onset quality:  Gradual Timing:  Intermittent Progression:  Waxing and waning Chronicity:  Recurrent Context: recent illness   Context: not sick contacts and not suspicious food intake   Relieved by:  Nothing Worsened by:  Nothing Associated symptoms: anorexia, chest pain, cough, nausea, shortness of breath and vomiting   Associated symptoms: no chills, no dysuria, no fever, no hematuria and no sore throat     Past Medical History:  Diagnosis Date  . AAA (abdominal aortic aneurysm) ([place])   . Arthritis   . COPD (chronic obstructive pulmonary disease) ([place])   . DM (diabetes mellitus) ([place])   . Dyslipidemia   . Hyperlipidemia   . Hypertension   . Left foot infection 2013  . 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 2013  . Pulmonary embolism [facility])     Patient Active Problem List   Diagnosis Date Noted  . Primary adenocarcinoma of middle lobe of right lung ([place]) 08/10/2018  . Thoracic aortic aneurysm ([place]) 08/03/2018  . Malnutrition of moderate degree 07/31/2018  . Oxygen dependent 07/28/2018  . Psoriasis 01/04/2018  . Osteoarthritis 11/03/2017  . Type 2 diabetes mellitus with stage 3 chronic kidney disease, without long-term current use of insulin ([place]) 09/28/2017  . Syncope 02/02/2017  . Atrial fibrillation, chronic   . Anticoagulated on Coumadin 10/09/2016  . Neck pain 12/11/2015  . CKD stage 3 secondary to diabetes ([place]) 07/08/2015  . Swelling of limb-Right  Leg 11/30/2013  . Aneurysm of abdominal vessel ([place]) 11/29/2011  . Hypertension associated with  diabetes ([place]) 10/29/2011  . Hyperlipidemia associated with type 2 diabetes mellitus ([place]) 10/29/2011  . COPD (chronic obstructive pulmonary disease) ([place]) 02/13/2010    Past Surgical History:  Procedure Laterality Date  . ABDOMINAL AORTIC ANEURYSM REPAIR  10-30-2011  . CHOLECYSTECTOMY N/A 10/14/2016   Procedure: LAPAROSCOPIC CHOLECYSTECTOMY;  Surgeon: Clovis Riley, MD;  Location: [place];  Service: General;  Laterality: N/A;  . ENDOVASCULAR STENT INSERTION  10/30/2011   Procedure: ENDOVASCULAR STENT GRAFT INSERTION;  Surgeon: Serafina Mitchell, MD;  Location: [place];  Service: Vascular;  Laterality: N/A;  . PACEMAKER IMPLANT N/A 02/04/2017   Procedure: Pacemaker Implant;  Surgeon: Constance Haw, MD;  Location: [place] [facility];  Service: Cardiovascular;  Laterality: N/A;  . VESICOVAGINAL FISTULA CLOSURE W/ TAH    . VIDEO BRONCHOSCOPY WITH ENDOBRONCHIAL NAVIGATION N/A 08/02/2018   Procedure: VIDEO BRONCHOSCOPY WITH ENDOBRONCHIAL NAVIGATION;  Surgeon: Margaretha Seeds, MD;  Location: [place];  Service: Thoracic;  Laterality: N/A;     OB History   No obstetric history on file.      Home Medications    Prior to Admission medications   Medication Sig Start Date End Date Taking? Authorizing Provider  acetaminophen (TYLENOL) 500 MG tablet Take 1,000 mg by mouth 2 (two) times daily as needed for mild pain.    [provider]  adapalene (DIFFERIN) 0.1 % gel APPLY TO SCALP PSORIASIS ONCE DAILY Patient taking differently: Apply 1 application topically daily.  05/25/18   [name],  Algis Greenhouse, MD  albuterol (PROVENTIL HFA;VENTOLIN HFA) 108 (90 Base) MCG/ACT inhaler Inhale 2 puffs into the lungs every 6 (six) hours as needed for wheezing or shortness of breath. 11/23/17   Vivi Barrack, MD  budesonide-formoterol [facility]) 80-4.5 MCG/ACT inhaler Inhale 2 puffs into the lungs 2 (two) times daily. 11/23/17   Vivi Barrack, MD  Cholecalciferol (VITAMIN D3) 1000 UNITS CAPS Take 1 capsule by  mouth daily.      [provider]  dextromethorphan-guaiFENesin (MUCINEX DM) 30-600 MG 12hr tablet Take 1 tablet by mouth 2 (two) times daily. 04/08/18   Barton Dubois, MD  Garlic 8338 MG CAPS Take 1,000 mg by mouth daily.     [provider]  glipiZIDE (GLUCOTROL) 5 MG tablet TAKE 1 TABLET(5 MG) BY MOUTH TWICE DAILY BEFORE A MEAL Patient taking differently: Take 5 mg by mouth 2 (two) times daily before a meal.  07/25/18   Vivi Barrack, MD  losartan (COZAAR) 25 MG tablet Take 1 tablet (25 mg total) by mouth daily. 04/28/18   Vivi Barrack, MD  lovastatin (MEVACOR) 20 MG tablet Take 1 tablet (20 mg total) by mouth at bedtime. 08/10/18   Vivi Barrack, MD  metFORMIN (GLUCOPHAGE) 500 MG tablet TAKE 1 TABLET(500 MG) BY MOUTH TWICE DAILY WITH A MEAL Patient taking differently: Take 500 mg by mouth 2 (two) times daily with a meal.  07/25/18   Vivi Barrack, MD  metoprolol tartrate (LOPRESSOR) 100 MG tablet Take 1 tablet (100 mg total) by mouth 2 (two) times daily. 02/08/18 05/09/18  Camnitz, Ocie Doyne, MD  Multiple Vitamin (MULTIVITAMIN) tablet Take 1 tablet by mouth daily.      [provider]  omega-3 acid ethyl esters (LOVAZA) 1 G capsule Take 2 g by mouth 2 (two) times daily.    [provider]  pantoprazole (PROTONIX) 40 MG tablet Take 1 tablet (40 mg total) by mouth daily. 04/09/18   Barton Dubois, MD  Tazarotene 0.1 % FOAM Apply to scalp psoriasis 1 time daily. Patient taking differently: Apply topically daily. Apply to scalp psoriasis 01/04/18   Vivi Barrack, MD  Tiotropium Bromide Monohydrate (SPIRIVA RESPIMAT) 2.5 MCG/ACT AERS Inhale 2 puffs into the lungs daily. 04/05/18   Vivi Barrack, MD  triamcinolone ointment (KENALOG) 0.5 % APPLY TOPICALLY TO THE AFFECTED AREA TWICE DAILY Patient taking differently: Apply 1 application topically 2 (two) times daily.  05/25/18   Vivi Barrack, MD  triamterene-hydrochlorothiazide (DYAZIDE) 37.5-25 MG capsule  Take 1 each (1 capsule total) by mouth daily. 02/16/18   Vivi Barrack, MD  warfarin (COUMADIN) 5 MG tablet Take 1-1.5 tablets (5-7.5 mg total) by mouth See admin instructions. Take 1 and 1/2 tablets on Monday then take 1 tablet the other days Patient not taking: Reported on 08/16/2018 05/01/18   Vivi Barrack, MD    Family History Family History  Problem Relation Age of Onset  . Heart disease Mother   . Hypertension Mother   . Heart attack Mother   . Hypertension Father   . Diabetes Father   . Heart attack Father   . Heart disease Sister        x 3  . Hypertension Sister   . Heart attack Sister   . Clotting disorder Daughter   . Clotting disorder Son        multiple sons  . Diabetes Son   . Hypertension Brother     Social History Social History  Tobacco Use  . Smoking status: Former Smoker    Packs/day: 0.50    Years: 54.00    Pack years: 27.00    Types: Cigarettes    Last attempt to quit: 01/23/2010    Years since quitting: 8.5  . Smokeless tobacco: Never Used  Substance Use Topics  . Alcohol use: No    Alcohol/week: 0.0 standard drinks  . Drug use: No     Allergies   Penicillins   Review of Systems Review of Systems  Constitutional: Negative for chills and fever.  HENT: Negative for ear pain and sore throat.   Eyes: Negative for pain and visual disturbance.  Respiratory: Positive for cough and shortness of breath.   Cardiovascular: Positive for chest pain. Negative for palpitations.  Gastrointestinal: Positive for abdominal pain, anorexia, nausea and vomiting.  Genitourinary: Negative for dysuria and hematuria.  Musculoskeletal: Negative for arthralgias and back pain.  Skin: Negative for color change and rash.  Neurological: Negative for seizures and syncope.  All other systems reviewed and are negative.    Physical Exam Updated Vital Signs  ED Triage Vitals  Enc Vitals Group     BP 08/23/18 2039 123/64     Pulse Rate 08/23/18 2039 78     Resp  08/23/18 2039 (!) 26     Temp 08/23/18 2039 97.6 F (36.4 C)     Temp Source 08/23/18 2039 Oral     SpO2 08/23/18 2039 (!) 86 %     Weight --      Height --      Head Circumference --      Peak Flow --      Pain Score 08/23/18 2056 8     Pain Loc --      Pain Edu? --      Excl. in [place]? --     Physical Exam Vitals signs and nursing note reviewed.  Constitutional:      General: She is in acute distress.     Appearance: She is well-developed. She is cachectic. She is ill-appearing.  HENT:     Head: Normocephalic and atraumatic.  Eyes:     Extraocular Movements: Extraocular movements intact.     Conjunctiva/sclera: Conjunctivae normal.  Neck:     Musculoskeletal: Neck supple.  Cardiovascular:     Rate and Rhythm: Normal rate and regular rhythm.     Heart sounds: Normal heart sounds. No murmur.  Pulmonary:     Effort: Pulmonary effort is normal. No respiratory distress.     Breath sounds: Normal breath sounds.  Abdominal:     General: Abdomen is flat.     Palpations: Abdomen is soft.     Tenderness: There is generalized abdominal tenderness (mild, diffuse, non-focal). There is no right CVA tenderness, left CVA tenderness, guarding or rebound. Negative signs include Murphy's sign and Rovsing's sign.     Hernia: No hernia is present.  Skin:    General: Skin is warm and dry.     Capillary Refill: Capillary refill takes less than 2 seconds.  Neurological:     Mental Status: She is alert.      ED Treatments / Results  Labs (all labs ordered are listed, but only abnormal results are displayed) Labs Reviewed  CBC WITH DIFFERENTIAL/PLATELET - Abnormal; Notable for the following components:      Result Value   WBC 23.0 (*)    RBC 3.22 (*)    Hemoglobin 7.3 (*)    HCT 22.4 (*)  MCV 69.6 (*)    MCH 22.7 (*)    RDW 15.7 (*)    Platelets 443 (*)    nRBC 0.6 (*)    All other components within normal limits  COMPREHENSIVE METABOLIC PANEL - Abnormal; Notable for the  following components:   Glucose, Bld 254 (*)    BUN 26 (*)    Creatinine, Ser 1.27 (*)    Calcium 8.3 (*)    Albumin 1.6 (*)    GFR calc non Af Amer 39 (*)    GFR calc Af Amer 45 (*)    All other components within normal limits  URINALYSIS, ROUTINE W REFLEX MICROSCOPIC - Abnormal; Notable for the following components:   APPearance HAZY (*)    Protein, ur 30 (*)    Bacteria, UA RARE (*)    All other components within normal limits  PROTIME-INR - Abnormal; Notable for the following components:   Prothrombin Time 30.5 (*)    All other components within normal limits  LIPASE, BLOOD  I-STAT TROPONIN, ED    EKG None  Radiology Ct Abdomen Pelvis Wo Contrast  Result Date: 08/23/2018 CLINICAL DATA:  85 year old female with abdominal pain and distension. EXAM: CT ABDOMEN AND PELVIS WITHOUT CONTRAST TECHNIQUE: Multidetector CT imaging of the abdomen and pelvis was performed following the standard protocol without IV contrast. COMPARISON:  CT chest abdomen and pelvis 02/02/2017 and earlier. FINDINGS: Lower chest: Negative. No pericardial or pleural effusion. Cardiac pacemaker leads are new since 2018. Hepatobiliary: Surgically absent gallbladder. Noncontrast liver appears stable. Pancreas: Negative. Spleen: Negative. Adrenals/Urinary Tract: Chronic left adrenal gland thickening is stable. The right adrenal gland remains negative. Noncontrast kidneys appear stable and negative. No nephrolithiasis. Proximal ureters are decompressed. Diminutive and unremarkable urinary bladder. Stomach/Bowel: Negative rectum and distal sigmoid. Mild to moderate diverticulosis of the descending and sigmoid colon with no active inflammation. Mild diverticulosis of the transverse colon with no active inflammation. Similar mild diverticulosis in the right colon. Negative appendix. Decompressed terminal ileum. No dilated small bowel. Stomach and duodenum appear stable. No free air, free fluid. Vascular/Lymphatic: Chronic  infrarenal abdominal aortic aneurysm with bifurcated aortic endograft in place. The native aneurysm sac appears stable since 2018, 59 millimeters diameter. Vascular patency is not evaluated in the absence of IV contrast. Advanced superimposed calcified atherosclerosis. Reproductive: Surgically absent. Other: No pelvic free fluid. Musculoskeletal: Advanced lower lumbar spine degeneration. Advanced pubic symphysis degeneration. No acute osseous abnormality identified. IMPRESSION: 1. No acute or inflammatory process identified in the noncontrast abdomen or pelvis. 2. Stable treated infrarenal abdominal aortic aneurysm with bifurcated aortic endograft in place. The native aneurysm measures 59 mm diameter. Aortic Atherosclerosis (ICD10-I70.0) and Aortic aneurysm NOS (ICD10-I71.9). 3. Widespread colonic diverticulosis without active inflammation. Normal appendix. Electronically Signed   By: Genevie Ann M.D.   On: 08/23/2018 22:17   Dg Chest Portable 1 View  Result Date: 08/23/2018 CLINICAL DATA:  Chest pain EXAM: PORTABLE CHEST 1 VIEW COMPARISON:  CT chest 07/28/2018 FINDINGS: There is no focal parenchymal opacity. There is no pleural effusion or pneumothorax. The heart and mediastinal contours are unremarkable. There is a dual lead cardiac pacemaker. The osseous structures are unremarkable. IMPRESSION: No active disease. Electronically Signed   By: Kathreen Devoid   On: 08/23/2018 22:03    Procedures Procedures (including critical care time)  Medications Ordered in ED Medications  fentaNYL (SUBLIMAZE) injection 50 mcg (has no administration in time range)  ondansetron (ZOFRAN) injection 4 mg (has no administration in time range)  oxyCODONE (  Oxy IR/ROXICODONE) immediate release tablet 5 mg (5 mg Oral Given 08/23/18 2300)  ondansetron (ZOFRAN-ODT) disintegrating tablet 4 mg (4 mg Oral Given 08/23/18 2321)     Initial Impression / Assessment and Plan / ED Course  I have reviewed the triage vital signs and the  nursing notes.  Pertinent labs & imaging results that were available during my care of the patient were reviewed by me and considered in my medical decision making (see chart for details).     Alejandra [name] is an 85 year old female with history of CKD, hypertension, diabetes, AAA, PE on Coumadin, new diagnosis of lung cancer about to begin palliative radiation who presents to the ED with diffuse pain, nausea, vomiting.  Patient with normal vitals.  No fever.  Patient with mostly abdominal pain but has had nausea and vomiting.  She is does not have any pain medicine at home.  She has not been able to talk with palliative care or hospice but family states that they have tried to reach out to those resources.  She is to start palliative radiation next week.  However, patient has been taking Tylenol at home without much relief.  She is currently on 4 L of oxygen at baseline.  Overall the patient appears cachectic and chronically ill-appearing.  She appears uncomfortable.  There is no specific abdominal tenderness on exam.  She denies any melena, hematochezia.  She denies any cough, sputum production.  Patient likely with pain secondary to ongoing oncology process.  Had long discussion with family and patient about goals of care.  Patient is a DNR but they had not fully talked about making her comfort care.  At this time they would like oral pain medications and lab work but would like to hold off and start an IV as this is usually painful process for the patient.  Patient to be evaluated with labs, chest x-ray, CT abdomen and pelvis.  CT abdomen pelvis showed no acute findings.  Chest x-ray showed no signs of pneumonia, pneumothorax, pleural effusion.  Patient had no signs of urinary tract infection.  Patient did have leukocytosis of 23, anemia at 7.3 however lab work otherwise unremarkable.  Patient denies any melena or hematochezia.  Likely from ongoing chronic processes including cancer.  Patient did not  have much improvement following oxycodone and Zofran.  Overall, not sure if there is any acute infectious process going on.  Suspect that this is likely secondary to cancer.  She has not had adequate pain management outpatient.  Does not appear to have a good outpatient plan and has not had any conversations with hospice or palliative care.  Therefore, patient to be admitted to the hospitalist service for further symptomatic care.  Will give IV fentanyl, IV Zofran.  Patient admitted to hospitalist service for further care.  Patient would benefit from palliative care consultation while she is inpatient.  This chart was dictated using voice recognition software.  Despite best efforts to proofread,  errors can occur which can change the documentation meaning.    Final Clinical Impressions(s) / ED Diagnoses   Final diagnoses:  Leukocytosis, unspecified type  Abdominal pain, unspecified abdominal location  Acute on chronic anemia    ED Discharge Orders    None       Lennice Sites, DO 08/24/18 0014

## 2018-08-24 ENCOUNTER — Inpatient Hospital Stay: Payer: Medicare Other | Admitting: Pulmonary Disease

## 2018-08-24 ENCOUNTER — Ambulatory Visit: Payer: Medicare Other | Admitting: Radiation Oncology

## 2018-08-24 ENCOUNTER — Other Ambulatory Visit: Payer: Self-pay

## 2018-08-24 DIAGNOSIS — Z515 Encounter for palliative care: Secondary | ICD-10-CM | POA: Diagnosis not present

## 2018-08-24 DIAGNOSIS — Z9981 Dependence on supplemental oxygen: Secondary | ICD-10-CM | POA: Diagnosis not present

## 2018-08-24 DIAGNOSIS — R52 Pain, unspecified: Secondary | ICD-10-CM

## 2018-08-24 DIAGNOSIS — Z7189 Other specified counseling: Secondary | ICD-10-CM

## 2018-08-24 DIAGNOSIS — C342 Malignant neoplasm of middle lobe, bronchus or lung: Secondary | ICD-10-CM

## 2018-08-24 DIAGNOSIS — E1159 Type 2 diabetes mellitus with other circulatory complications: Secondary | ICD-10-CM | POA: Diagnosis not present

## 2018-08-24 DIAGNOSIS — I10 Essential (primary) hypertension: Secondary | ICD-10-CM

## 2018-08-24 LAB — CBC WITH DIFFERENTIAL/PLATELET
Basophils Absolute: 0 10*3/uL (ref 0.0–0.1)
Basophils Relative: 0 %
Eosinophils Absolute: 0 10*3/uL (ref 0.0–0.5)
Eosinophils Relative: 0 %
HCT: 22.4 % — ABNORMAL LOW (ref 36.0–46.0)
HEMOGLOBIN: 7.3 g/dL — AB (ref 12.0–15.0)
Lymphocytes Relative: 2 %
Lymphs Abs: 0.5 10*3/uL — ABNORMAL LOW (ref 0.7–4.0)
MCH: 22.7 pg — ABNORMAL LOW (ref 26.0–34.0)
MCHC: 32.6 g/dL (ref 30.0–36.0)
MCV: 69.6 fL — ABNORMAL LOW (ref 80.0–100.0)
Monocytes Absolute: 1.4 10*3/uL — ABNORMAL HIGH (ref 0.1–1.0)
Monocytes Relative: 6 %
Neutro Abs: 21.1 10*3/uL — ABNORMAL HIGH (ref 1.7–7.7)
Neutrophils Relative %: 92 %
Platelets: 443 10*3/uL — ABNORMAL HIGH (ref 150–400)
RBC: 3.22 MIL/uL — AB (ref 3.87–5.11)
RDW: 15.7 % — ABNORMAL HIGH (ref 11.5–15.5)
WBC: 23 10*3/uL — ABNORMAL HIGH (ref 4.0–10.5)
nRBC: 0.6 % — ABNORMAL HIGH (ref 0.0–0.2)

## 2018-08-24 LAB — GLUCOSE, CAPILLARY
Glucose-Capillary: 219 mg/dL — ABNORMAL HIGH (ref 70–99)
Glucose-Capillary: 220 mg/dL — ABNORMAL HIGH (ref 70–99)
Glucose-Capillary: 268 mg/dL — ABNORMAL HIGH (ref 70–99)
Glucose-Capillary: 276 mg/dL — ABNORMAL HIGH (ref 70–99)

## 2018-08-24 MED ORDER — FENTANYL 12 MCG/HR TD PT72
1.0000 | MEDICATED_PATCH | Freq: Once | TRANSDERMAL | Status: DC
  Administered 2018-08-24: 1 via TRANSDERMAL
  Filled 2018-08-24: qty 1

## 2018-08-24 MED ORDER — OXYCODONE HCL 5 MG PO TABS
5.0000 mg | ORAL_TABLET | ORAL | Status: DC | PRN
  Administered 2018-08-24 – 2018-08-25 (×4): 5 mg via ORAL
  Filled 2018-08-24 (×4): qty 1

## 2018-08-24 MED ORDER — SENNA 8.6 MG PO TABS
1.0000 | ORAL_TABLET | Freq: Every day | ORAL | Status: DC
  Administered 2018-08-24: 8.6 mg via ORAL
  Filled 2018-08-24: qty 1

## 2018-08-24 MED ORDER — FENTANYL CITRATE (PF) 100 MCG/2ML IJ SOLN
50.0000 ug | Freq: Once | INTRAMUSCULAR | Status: DC
  Filled 2018-08-24 (×2): qty 2

## 2018-08-24 MED ORDER — INSULIN ASPART 100 UNIT/ML ~~LOC~~ SOLN
0.0000 [IU] | Freq: Three times a day (TID) | SUBCUTANEOUS | Status: DC
  Administered 2018-08-24: 5 [IU] via SUBCUTANEOUS
  Administered 2018-08-24: 3 [IU] via SUBCUTANEOUS

## 2018-08-24 MED ORDER — FENTANYL 12 MCG/HR TD PT72: 1.0000 | MEDICATED_PATCH | TRANSDERMAL | Status: DC

## 2018-08-24 MED ORDER — LIDOCAINE 5 % EX PTCH
1.0000 | MEDICATED_PATCH | CUTANEOUS | Status: DC
  Administered 2018-08-24 – 2018-08-25 (×2): 1 via TRANSDERMAL
  Filled 2018-08-24 (×2): qty 1

## 2018-08-24 MED ORDER — ONDANSETRON HCL 4 MG/2ML IJ SOLN
4.0000 mg | Freq: Once | INTRAMUSCULAR | Status: DC
  Filled 2018-08-24 (×2): qty 2

## 2018-08-24 MED ORDER — DOCUSATE SODIUM 100 MG PO CAPS
100.0000 mg | ORAL_CAPSULE | Freq: Two times a day (BID) | ORAL | Status: DC
  Filled 2018-08-24: qty 1

## 2018-08-24 MED ORDER — SODIUM CHLORIDE 0.9 % IV SOLN
Freq: Once | INTRAVENOUS | Status: AC
  Administered 2018-08-24: 02:00:00 via INTRAVENOUS

## 2018-08-24 MED ORDER — ONDANSETRON HCL 4 MG/2ML IJ SOLN
4.0000 mg | Freq: Four times a day (QID) | INTRAMUSCULAR | Status: DC | PRN
  Administered 2018-08-24: 4 mg via INTRAVENOUS

## 2018-08-24 MED ORDER — MORPHINE SULFATE (PF) 2 MG/ML IV SOLN
2.0000 mg | INTRAVENOUS | Status: DC | PRN
  Administered 2018-08-24 (×2): 2 mg via INTRAVENOUS
  Filled 2018-08-24 (×2): qty 1

## 2018-08-24 MED ORDER — ACETAMINOPHEN 325 MG PO TABS: 650.0000 mg | ORAL_TABLET | Freq: Four times a day (QID) | ORAL | Status: DC | PRN

## 2018-08-24 MED ORDER — ONDANSETRON HCL 4 MG PO TABS: 4.0000 mg | ORAL_TABLET | Freq: Four times a day (QID) | ORAL | Status: DC | PRN

## 2018-08-24 MED ORDER — ACETAMINOPHEN 650 MG RE SUPP: 650.0000 mg | Freq: Four times a day (QID) | RECTAL | Status: DC | PRN

## 2018-08-24 MED ORDER — METFORMIN HCL 500 MG PO TABS
500.0000 mg | ORAL_TABLET | Freq: Two times a day (BID) | ORAL | Status: DC
  Administered 2018-08-24 – 2018-08-25 (×3): 500 mg via ORAL
  Filled 2018-08-24 (×3): qty 1

## 2018-08-24 NOTE — ED Notes (Signed)
Attempted IV access, without success. Per family request, consulting IV team for further attempts.

## 2018-08-24 NOTE — Progress Notes (Signed)
Patient admitted after midnight, please see H&P.  Here with FTT/adbominal pain.  Per admitting MD family is not interested in pursuing further treatment and would like hospice.  Patient has just received morphine and is not able to discuss her wishes.  Will start with palliative care consult for symptom management and GOC.  BM yesterday per family and bladder scan dose not show urinary retention.  CT scan w/o source of pain.   Eulogio Bear DO

## 2018-08-24 NOTE — Consult Note (Signed)
Consultation Note Date: 08/24/2018   Patient Name: Alejandra [name]  DOB: [birth year]  MRN: [id number]  Age / Sex: 85 y.o., female  PCP: Vivi Barrack, MD Referring Physician: Geradine Girt, DO  Reason for Consultation: Establishing goals of care, Hospice Evaluation and Pain control  HPI/Patient Profile: 85 y.o. female  with past medical history of recently diagnosed lung cancer, T2DM, CKD, COPD, and HTN admitted on 08/23/2018 with abdominal pain. Abdominal CT and chest xray negative. PMT consulted for symptom management and hospice evaluation.   Clinical Assessment and Goals of Care: I have reviewed medical records including EPIC notes, labs and imaging, assessed the patient and then spoke with patient's daughter, Mateo Flow, to discuss diagnosis prognosis, GOC, EOL wishes, disposition and options.  Attempted to involve patient is [place] conversation, however she was sleepy and difficult to engage in conversation.   I introduced Palliative Medicine as specialized medical care for people living with serious illness. It focuses on providing relief from the symptoms and stress of a serious illness. The goal is to improve quality of life for both the patient and the family.   We discussed her current illness and what it means in the larger context of her on-going co-morbidities.  Natural disease trajectory and expectations at EOL were discussed.   Family understands poor prognosis and are clear they want to focus on patient's comfort and quality of life. They want to cancel her radiation appt that is scheduled for Monday - she is stressed about getting in touch with cancer center and asks me to call and cancel appointments. They want to go home with hospice care.   We discussed symptom management. Patient is currently comfortable - denies pain. Resting well. Discussed adding senna for bowel management. Continue lidoderm patch and prn oxycodone. Family is  concerned about insulin shots and CBG checks. They ask if she can be switch to home oral DM medications.   Questions and concerns were addressed.  Hard Choices booklet left for review. The family was encouraged to call with questions or concerns.   Primary Decision Maker PATIENT - fluctuating mental status - joined by family for decisions    SUMMARY OF RECOMMENDATIONS   - case management has been consulted for hospice care at home - continue lidoderm and q4hr oxycodone PRN as patient's pain is adequately controlled - add senna - consider d/c CBG and insulin shots and ordering home oral DM medications as family concerned about discomfort caused by needles - I have called cancer center and informed them of desire to cancel upcoming appointments  Code Status/Advance Care Planning:  DNR  Palliative Prophylaxis:   Bowel Regimen, Delirium Protocol, Frequent Pain Assessment and Oral Care  Additional Recommendations (Limitations, Scope, Preferences):  Avoid Hospitalization, Full Comfort Care, No Chemotherapy and No Radiation  Psycho-social/Spiritual:   Desire for further Chaplaincy support:no  Additional Recommendations: Education on Hospice  Prognosis:   < 3 months  Discharge Planning: Home with Hospice      Primary Diagnoses: Present on Admission: . Intractable pain . COPD (chronic obstructive pulmonary disease) ([place]) . Hypertension associated with diabetes ([place]) . Atrial fibrillation, chronic . Primary adenocarcinoma of middle lobe of right lung ([place])   I have reviewed the medical record, interviewed the patient and family, and examined the patient. The following aspects are pertinent.  Past Medical History:  Diagnosis Date  . AAA (abdominal aortic aneurysm) ([place])   . Arthritis   . COPD (chronic obstructive pulmonary disease) ([place])   . DM (  diabetes mellitus) ([place])   . Dyslipidemia   . Hyperlipidemia   . Hypertension   . Left foot infection 2013  . O2 dependent  Nov. 2013  . Pulmonary embolism [facility])    Social History   Socioeconomic History  . Marital status: Widowed    Spouse name: Not on file  . Number of children: 2  . Years of education: 84  . Highest education level: Not on file  Occupational History  . Occupation: retired    Comment: Building surveyor  Social Needs  . Financial resource strain: Not on file  . Food insecurity:    Worry: Not on file    Inability: Not on file  . Transportation needs:    Medical: Not on file    Non-medical: Not on file  Tobacco Use  . Smoking status: Former Smoker    Packs/day: 0.50    Years: 54.00    Pack years: 27.00    Types: Cigarettes    Last attempt to quit: 01/23/2010    Years since quitting: 8.5  . Smokeless tobacco: Never Used  Substance and Sexual Activity  . Alcohol use: No    Alcohol/week: 0.0 standard drinks  . Drug use: No  . Sexual activity: Not on file  Lifestyle  . Physical activity:    Days per week: Not on file    Minutes per session: Not on file  . Stress: Not on file  Relationships  . Social connections:    Talks on phone: Not on file    Gets together: Not on file    Attends religious service: Not on file    Active member of club or organization: Not on file    Attends meetings of clubs or organizations: Not on file    Relationship status: Not on file  Other Topics Concern  . Not on file  Social History Narrative   Fun: Goes to church, Watch TV   Denies religious beliefs affecting health care.    Family History  Problem Relation Age of Onset  . Heart disease Mother   . Hypertension Mother   . Heart attack Mother   . Hypertension Father   . Diabetes Father   . Heart attack Father   . Heart disease Sister        x 3  . Hypertension Sister   . Heart attack Sister   . Clotting disorder Daughter   . Clotting disorder Son        multiple sons  . Diabetes Son   . Hypertension Brother    Scheduled Meds: . docusate sodium  100 mg Oral BID  . fentaNYL  (SUBLIMAZE) injection  50 mcg Intravenous Once  . insulin aspart  0-9 Units Subcutaneous TID WC  . lidocaine  1 patch Transdermal Q24H  . ondansetron (ZOFRAN) IV  4 mg Intravenous Once   Continuous Infusions: PRN Meds:.acetaminophen **OR** acetaminophen, ondansetron **OR** ondansetron (ZOFRAN) IV, oxyCODONE Allergies  Allergen Reactions  . Penicillins Nausea And Vomiting, Rash and Other (See Comments)    Childhood reaction, tolerated multiple cephs in 2013. Has patient had a PCN reaction causing immediate rash, facial/tongue/throat swelling, SOB or lightheadedness with hypotension: Yes Has patient had a PCN reaction causing severe rash involving mucus membranes or skin necrosis: Yes Has patient had a PCN reaction that required hospitalization: Yes Has patient had a PCN reaction occurring within the last 10 years: No    Review of Systems  Unable to perform ROS: Mental status change  Physical Exam Constitutional:      General: She is not in acute distress. Cardiovascular:     Rate and Rhythm: Normal rate.  Pulmonary:     Effort: Pulmonary effort is normal.     Breath sounds: Normal breath sounds.  Musculoskeletal:     Right lower leg: No edema.     Left lower leg: No edema.  Skin:    General: Skin is warm and dry.  Neurological:     Mental Status: She is lethargic.     Comments: Patient unwilling to answer orientation questions, sleepy     Vital Signs: BP (!) 111/57 (BP Location: Right Arm)   Pulse 99   Temp (!) 97.4 F (36.3 C) (Oral)   Resp 18   Ht 5\' 5"  (1.651 m)   Wt 57 kg   SpO2 99%   BMI 20.91 kg/m  Pain Scale: 0-10 POSS *See Group Information*: 1-Acceptable,Awake and alert Pain Score: 5    SpO2: SpO2: 99 % O2 Device:SpO2: 99 % O2 Flow Rate: .O2 Flow Rate (L/min): 2 L/min  IO: Intake/output summary: No intake or output data in the 24 hours ending 08/24/18 1436  LBM: Last BM Date: 08/23/18 Baseline Weight: Weight: 57 kg Most recent weight: Weight: 57  kg     Palliative Assessment/Data: PPS 40%    Time Total: 50 minutes Greater than 50%  of this time was spent counseling and coordinating care related to the above assessment and plan.  Juel Burrow, DNP, AGNP-C Palliative Medicine Team [phone] Pager: [phone]

## 2018-08-24 NOTE — Progress Notes (Signed)
Pt bladder scanned for 166 ml. Provider made aware.

## 2018-08-24 NOTE — H&P (Signed)
H&P        History and Physical    Alejandra [name] WIO:[id number] DOB: March 15, 1932 DOA: 08/23/2018  PCP: Vivi Barrack, MD  Patient coming from: Home  I have personally briefly reviewed patient's old medical records in [place]  Chief Complaint: Pain  HPI: Alejandra [name] is a 85 y.o. female with medical history significant of recently diagnosed lung cancer, type 2 diabetes, chronic kidney disease presents with pain.  Patient complains of pain all over.  When I speak to her she says her pain is worse in her lower abdomen and her right side.  Patient had a nonacute CT of her abdomen pelvis.  Also had a negative chest x-ray.  After further discussion with family particularly niece and daughter at bedside they do not think that patient will be able to undergo radiation as she is so fragile.  Pelvis radiation doctor scheduled for Monday.  They wish for pain control and hospice  consultation.    ED Course: Patient was given p.o. Zofran and oxycodone in the ED with some improvement of her pain but continues.  Review of Systems: Positive for abdominal right flank pain chronic shortness of breath as per   Past Medical History:  Diagnosis Date  . AAA (abdominal aortic aneurysm) ([place])   . Arthritis   . COPD (chronic obstructive pulmonary disease) ([place])   . DM (diabetes mellitus) ([place])   . Dyslipidemia   . Hyperlipidemia   . Hypertension   . Left foot infection 2013  . 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 2013  . Pulmonary embolism [facility])     Past Surgical History:  Procedure Laterality Date  . ABDOMINAL AORTIC ANEURYSM REPAIR  10-30-2011  . CHOLECYSTECTOMY N/A 10/14/2016   Procedure: LAPAROSCOPIC CHOLECYSTECTOMY;  Surgeon: Clovis Riley, MD;  Location: [place];  Service: General;  Laterality: N/A;  . ENDOVASCULAR STENT INSERTION  10/30/2011   Procedure: ENDOVASCULAR STENT GRAFT INSERTION;  Surgeon: Serafina Mitchell, MD;  Location: [place];  Service: Vascular;  Laterality: N/A;  . PACEMAKER IMPLANT N/A  02/04/2017   Procedure: Pacemaker Implant;  Surgeon: Constance Haw, MD;  Location: [place] CV [facility];  Service: Cardiovascular;  Laterality: N/A;  . VESICOVAGINAL FISTULA CLOSURE W/ TAH    . VIDEO BRONCHOSCOPY WITH ENDOBRONCHIAL NAVIGATION N/A 08/02/2018   Procedure: VIDEO BRONCHOSCOPY WITH ENDOBRONCHIAL NAVIGATION;  Surgeon: Margaretha Seeds, MD;  Location: [place];  Service: Thoracic;  Laterality: N/A;     reports that she quit smoking about 8 years ago. Her smoking use included cigarettes. She has a 27.00 pack-year smoking history. She has never used smokeless tobacco. She reports that she does not drink alcohol or use drugs.  Allergies  Allergen Reactions  . Penicillins Nausea And Vomiting, Rash and Other (See Comments)    Childhood reaction, tolerated multiple cephs in 2013. Has patient had a PCN reaction causing immediate rash, facial/tongue/throat swelling, SOB or lightheadedness with hypotension: Yes Has patient had a PCN reaction causing severe rash involving mucus membranes or skin necrosis: Yes Has patient had a PCN reaction that required hospitalization: Yes Has patient had a PCN reaction occurring within the last 10 years: No     Family History  Problem Relation Age of Onset  . Heart disease Mother   . Hypertension Mother   . Heart attack Mother   . Hypertension Father   . Diabetes Father   . Heart attack Father   . Heart disease Sister  x 3  . Hypertension Sister   . Heart attack Sister   . Clotting disorder Daughter   . Clotting disorder Son        multiple sons  . Diabetes Son   . Hypertension Brother      Prior to Admission medications   Medication Sig Start Date End Date Taking? Authorizing Provider  acetaminophen (TYLENOL) 500 MG tablet Take 1,000 mg by mouth 2 (two) times daily as needed for mild pain.    [provider]  adapalene (DIFFERIN) 0.1 % gel APPLY TO SCALP PSORIASIS ONCE DAILY Patient taking differently: Apply 1 application  topically daily.  05/25/18   Vivi Barrack, MD  albuterol (PROVENTIL HFA;VENTOLIN HFA) 108 (90 Base) MCG/ACT inhaler Inhale 2 puffs into the lungs every 6 (six) hours as needed for wheezing or shortness of breath. 11/23/17   Vivi Barrack, MD  budesonide-formoterol [facility]) 80-4.5 MCG/ACT inhaler Inhale 2 puffs into the lungs 2 (two) times daily. 11/23/17   Vivi Barrack, MD  Cholecalciferol (VITAMIN D3) 1000 UNITS CAPS Take 1 capsule by mouth daily.      [provider]  dextromethorphan-guaiFENesin (MUCINEX DM) 30-600 MG 12hr tablet Take 1 tablet by mouth 2 (two) times daily. 04/08/18   Barton Dubois, MD  Garlic 1950 MG CAPS Take 1,000 mg by mouth daily.     [provider]  glipiZIDE (GLUCOTROL) 5 MG tablet TAKE 1 TABLET(5 MG) BY MOUTH TWICE DAILY BEFORE A MEAL Patient taking differently: Take 5 mg by mouth 2 (two) times daily before a meal.  07/25/18   Vivi Barrack, MD  losartan (COZAAR) 25 MG tablet Take 1 tablet (25 mg total) by mouth daily. 04/28/18   Vivi Barrack, MD  lovastatin (MEVACOR) 20 MG tablet Take 1 tablet (20 mg total) by mouth at bedtime. 08/10/18   Vivi Barrack, MD  metFORMIN (GLUCOPHAGE) 500 MG tablet TAKE 1 TABLET(500 MG) BY MOUTH TWICE DAILY WITH A MEAL Patient taking differently: Take 500 mg by mouth 2 (two) times daily with a meal.  07/25/18   Vivi Barrack, MD  metoprolol tartrate (LOPRESSOR) 100 MG tablet Take 1 tablet (100 mg total) by mouth 2 (two) times daily. 02/08/18 05/09/18  Camnitz, Ocie Doyne, MD  Multiple Vitamin (MULTIVITAMIN) tablet Take 1 tablet by mouth daily.      [provider]  omega-3 acid ethyl esters (LOVAZA) 1 G capsule Take 2 g by mouth 2 (two) times daily.    [provider]  pantoprazole (PROTONIX) 40 MG tablet Take 1 tablet (40 mg total) by mouth daily. 04/09/18   Barton Dubois, MD  Tazarotene 0.1 % FOAM Apply to scalp psoriasis 1 time daily. Patient taking differently: Apply topically daily. Apply to  scalp psoriasis 01/04/18   Vivi Barrack, MD  Tiotropium Bromide Monohydrate (SPIRIVA RESPIMAT) 2.5 MCG/ACT AERS Inhale 2 puffs into the lungs daily. 04/05/18   Vivi Barrack, MD  triamcinolone ointment (KENALOG) 0.5 % APPLY TOPICALLY TO THE AFFECTED AREA TWICE DAILY Patient taking differently: Apply 1 application topically 2 (two) times daily.  05/25/18   Vivi Barrack, MD  triamterene-hydrochlorothiazide (DYAZIDE) 37.5-25 MG capsule Take 1 each (1 capsule total) by mouth daily. 02/16/18   Vivi Barrack, MD  warfarin (COUMADIN) 5 MG tablet Take 1-1.5 tablets (5-7.5 mg total) by mouth See admin instructions. Take 1 and 1/2 tablets on Monday then take 1 tablet the other days Patient not taking: Reported on 08/16/2018 05/01/18  Vivi Barrack, MD    Physical Exam: Vitals:   08/23/18 2039 08/23/18 2056 08/23/18 2100  BP: 123/64 128/64 128/66  Pulse: 78 77   Resp: (!) 26 15 20   Temp: 97.6 F (36.4 C) (!) 97.5 F (36.4 C)   TempSrc: Oral Oral   SpO2: (!) 86% 95%     Constitutional: NAD, calm, comfortable Vitals:   08/23/18 2039 08/23/18 2056 08/23/18 2100  BP: 123/64 128/64 128/66  Pulse: 78 77   Resp: (!) 26 15 20   Temp: 97.6 F (36.4 C) (!) 97.5 F (36.4 C)   TempSrc: Oral Oral   SpO2: (!) 86% 95%     Respiratory: clear to auscultation bilaterally, no wheezing, no crackles. Normal respiratory effort. No accessory muscle use.  Cardiovascular: Regular rate and rhythm,  Abdomen: mild gen  tenderness, no masses palpated. . Bowel sounds positive.  No rebound or guarding Psychiatric: Normal judgment and insight. Alert and oriented x 3. Normal mood.     Labs on Admission: I have personally reviewed following labs and imaging studies  CBC: Recent Labs  Lab 08/23/18 2306  WBC 23.0*  NEUTROABS 21.1*  HGB 7.3*  HCT 22.4*  MCV 69.6*  PLT 676*   Basic Metabolic Panel: Recent Labs  Lab 08/23/18 2306  NA 139  K 4.5  CL 98  CO2 30  GLUCOSE 254*  BUN 26*  CREATININE  1.27*  CALCIUM 8.3*   GFR: Estimated Creatinine Clearance: 29.7 mL/min (A) (by C-G formula based on SCr of 1.27 mg/dL (H)). Liver Function Tests: Recent Labs  Lab 08/23/18 2306  AST 24  ALT 21  ALKPHOS 106  BILITOT 0.6  PROT 6.9  ALBUMIN 1.6*   Recent Labs  Lab 08/23/18 2306  LIPASE 20   No results for input(s): AMMONIA in the last 168 hours. Coagulation Profile: Recent Labs  Lab 08/22/18 08/23/18 2306  INR 3.0 2.97   Cardiac Enzymes: No results for input(s): CKTOTAL, CKMB, CKMBINDEX, TROPONINI in the last 168 hours. BNP (last 3 results) No results for input(s): PROBNP in the last 8760 hours. HbA1C: No results for input(s): HGBA1C in the last 72 hours. CBG: No results for input(s): GLUCAP in the last 168 hours. Lipid Profile: No results for input(s): CHOL, HDL, LDLCALC, TRIG, CHOLHDL, LDLDIRECT in the last 72 hours. Thyroid Function Tests: No results for input(s): TSH, T4TOTAL, FREET4, T3FREE, THYROIDAB in the last 72 hours. Anemia Panel: No results for input(s): VITAMINB12, FOLATE, FERRITIN, TIBC, IRON, RETICCTPCT in the last 72 hours. Urine analysis:    Component Value Date/Time   COLORURINE YELLOW 08/23/2018 2141   APPEARANCEUR HAZY (A) 08/23/2018 2141   LABSPEC 1.021 08/23/2018 2141   PHURINE 5.0 08/23/2018 2141   GLUCOSEU NEGATIVE 08/23/2018 2141   HGBUR NEGATIVE 08/23/2018 2141   BILIRUBINUR NEGATIVE 08/23/2018 2141   [place] 08/23/2018 2141   PROTEINUR 30 (A) 08/23/2018 2141   UROBILINOGEN 0.2 10/29/2011 1414   NITRITE NEGATIVE 08/23/2018 2141   LEUKOCYTESUR NEGATIVE 08/23/2018 2141    Radiological Exams on Admission: Ct Abdomen Pelvis Wo Contrast  Result Date: 08/23/2018 CLINICAL DATA:  85 year old female with abdominal pain and distension. EXAM: CT ABDOMEN AND PELVIS WITHOUT CONTRAST TECHNIQUE: Multidetector CT imaging of the abdomen and pelvis was performed following the standard protocol without IV contrast. COMPARISON:  CT chest  abdomen and pelvis 02/02/2017 and earlier. FINDINGS: Lower chest: Negative. No pericardial or pleural effusion. Cardiac pacemaker leads are new since 2018. Hepatobiliary: Surgically absent gallbladder. Noncontrast liver appears stable. Pancreas:  Negative. Spleen: Negative. Adrenals/Urinary Tract: Chronic left adrenal gland thickening is stable. The right adrenal gland remains negative. Noncontrast kidneys appear stable and negative. No nephrolithiasis. Proximal ureters are decompressed. Diminutive and unremarkable urinary bladder. Stomach/Bowel: Negative rectum and distal sigmoid. Mild to moderate diverticulosis of the descending and sigmoid colon with no active inflammation. Mild diverticulosis of the transverse colon with no active inflammation. Similar mild diverticulosis in the right colon. Negative appendix. Decompressed terminal ileum. No dilated small bowel. Stomach and duodenum appear stable. No free air, free fluid. Vascular/Lymphatic: Chronic infrarenal abdominal aortic aneurysm with bifurcated aortic endograft in place. The native aneurysm sac appears stable since 2018, 59 millimeters diameter. Vascular patency is not evaluated in the absence of IV contrast. Advanced superimposed calcified atherosclerosis. Reproductive: Surgically absent. Other: No pelvic free fluid. Musculoskeletal: Advanced lower lumbar spine degeneration. Advanced pubic symphysis degeneration. No acute osseous abnormality identified. IMPRESSION: 1. No acute or inflammatory process identified in the noncontrast abdomen or pelvis. 2. Stable treated infrarenal abdominal aortic aneurysm with bifurcated aortic endograft in place. The native aneurysm measures 59 mm diameter. Aortic Atherosclerosis (ICD10-I70.0) and Aortic aneurysm NOS (ICD10-I71.9). 3. Widespread colonic diverticulosis without active inflammation. Normal appendix. Electronically Signed   By: Genevie Ann M.D.   On: 08/23/2018 22:17   Dg Chest Portable 1 View  Result Date:  08/23/2018 CLINICAL DATA:  Chest pain EXAM: PORTABLE CHEST 1 VIEW COMPARISON:  CT chest 07/28/2018 FINDINGS: There is no focal parenchymal opacity. There is no pleural effusion or pneumothorax. The heart and mediastinal contours are unremarkable. There is a dual lead cardiac pacemaker. The osseous structures are unremarkable. IMPRESSION: No active disease. Electronically Signed   By: Kathreen Devoid   On: 08/23/2018 22:03      Assessment/Plan Principal Problem:   Intractable pain Active Problems:   Primary adenocarcinoma of middle lobe of right lung ([name])   COPD (chronic obstructive pulmonary disease) ([name])   Hypertension associated with diabetes ([place])   Anticoagulated on Coumadin   Atrial fibrillation, chronic   Oxygen dependent Chronic kidney disease Chronic anemia  -Admit for pain control and hospice consultation.  Will start a Duragesic patch with as needed meds.  -Gentle IV fluids -Positive leukocytosis but patient is afebrile.  Chest x-ray urinalysis both benign.  Suspect due to cancer -Continue home oxygen supplementation -Awaiting home med reconciliation -Discussed with family stopping  Coumadin as patient is now palliative care. -Baseline hemoglobin slightly worse at 7.3 prior to 8.5. -Chronic kidney disease stable  DVT prophylaxis: Anticoagulated on Coumadin Code Status: DNR  Family Communication: Daughter and niece at bedside Disposition Plan: Home with hospice 1 to 2 days  Admission status: Observation medical floor  Abdelrahman Nair Johnson-Pitts MD [facility] Pager [phone]  If 7PM-7AM, please contact night-coverage [email or web address] Password [email or web address]  08/24/2018, 12:53 AM

## 2018-08-25 ENCOUNTER — Ambulatory Visit: Payer: Self-pay | Admitting: General Practice

## 2018-08-25 ENCOUNTER — Telehealth: Payer: Self-pay | Admitting: General Practice

## 2018-08-25 ENCOUNTER — Telehealth: Payer: Self-pay | Admitting: Family Medicine

## 2018-08-25 ENCOUNTER — Ambulatory Visit: Payer: Medicare Other

## 2018-08-25 DIAGNOSIS — Z515 Encounter for palliative care: Secondary | ICD-10-CM | POA: Diagnosis not present

## 2018-08-25 DIAGNOSIS — D649 Anemia, unspecified: Secondary | ICD-10-CM

## 2018-08-25 DIAGNOSIS — R52 Pain, unspecified: Secondary | ICD-10-CM | POA: Diagnosis not present

## 2018-08-25 DIAGNOSIS — R109 Unspecified abdominal pain: Secondary | ICD-10-CM

## 2018-08-25 DIAGNOSIS — I482 Chronic atrial fibrillation, unspecified: Secondary | ICD-10-CM | POA: Diagnosis not present

## 2018-08-25 DIAGNOSIS — Z7189 Other specified counseling: Secondary | ICD-10-CM | POA: Diagnosis not present

## 2018-08-25 DIAGNOSIS — C342 Malignant neoplasm of middle lobe, bronchus or lung: Secondary | ICD-10-CM | POA: Diagnosis not present

## 2018-08-25 DIAGNOSIS — J432 Centrilobular emphysema: Secondary | ICD-10-CM

## 2018-08-25 LAB — CBC WITH DIFFERENTIAL/PLATELET
Abs Immature Granulocytes: 0 10*3/uL (ref 0.00–0.07)
Basophils Absolute: 0 10*3/uL (ref 0.0–0.1)
Basophils Relative: 0 %
EOS PCT: 3 %
Eosinophils Absolute: 0.6 10*3/uL — ABNORMAL HIGH (ref 0.0–0.5)
HCT: 19.7 % — ABNORMAL LOW (ref 36.0–46.0)
Hemoglobin: 6.5 g/dL — CL (ref 12.0–15.0)
Lymphocytes Relative: 3 %
Lymphs Abs: 0.6 10*3/uL — ABNORMAL LOW (ref 0.7–4.0)
MCH: 22.9 pg — AB (ref 26.0–34.0)
MCHC: 33 g/dL (ref 30.0–36.0)
MCV: 69.4 fL — AB (ref 80.0–100.0)
MONO ABS: 0.7 10*3/uL (ref 0.1–1.0)
Monocytes Relative: 4 %
Neutro Abs: 16.7 10*3/uL — ABNORMAL HIGH (ref 1.7–7.7)
Neutrophils Relative %: 90 %
Platelets: 356 10*3/uL (ref 150–400)
RBC: 2.84 MIL/uL — ABNORMAL LOW (ref 3.87–5.11)
RDW: 15.7 % — ABNORMAL HIGH (ref 11.5–15.5)
WBC: 18.5 10*3/uL — ABNORMAL HIGH (ref 4.0–10.5)
nRBC: 0 /100 WBC
nRBC: 0.4 % — ABNORMAL HIGH (ref 0.0–0.2)

## 2018-08-25 LAB — GLUCOSE, CAPILLARY: Glucose-Capillary: 246 mg/dL — ABNORMAL HIGH (ref 70–99)

## 2018-08-25 LAB — PREPARE RBC (CROSSMATCH)

## 2018-08-25 LAB — PROTIME-INR
INR: 3.44
Prothrombin Time: 34.2 seconds — ABNORMAL HIGH (ref 11.4–15.2)

## 2018-08-25 MED ORDER — BUDESONIDE-FORMOTEROL FUMARATE 80-4.5 MCG/ACT IN AERO: 2.0000 | INHALATION_SPRAY | Freq: Two times a day (BID) | RESPIRATORY_TRACT | 3 refills | Status: AC

## 2018-08-25 MED ORDER — SODIUM CHLORIDE 0.9% IV SOLUTION
Freq: Once | INTRAVENOUS | Status: AC
  Administered 2018-08-25: 17:00:00 via INTRAVENOUS

## 2018-08-25 MED ORDER — LIDOCAINE 5 % EX PTCH: 1.0000 | MEDICATED_PATCH | CUTANEOUS | 0 refills | Status: AC

## 2018-08-25 MED ORDER — GLIPIZIDE 2.5 MG HALF TABLET
2.5000 mg | ORAL_TABLET | Freq: Every day | ORAL | Status: DC
  Administered 2018-08-25: 2.5 mg via ORAL
  Filled 2018-08-25 (×2): qty 1

## 2018-08-25 MED ORDER — SENNA 8.6 MG PO TABS: 1.0000 | ORAL_TABLET | Freq: Every day | ORAL | 0 refills | Status: AC

## 2018-08-25 MED ORDER — OXYCODONE HCL 5 MG PO TABS: 5.0000 mg | ORAL_TABLET | ORAL | 0 refills | Status: AC | PRN

## 2018-08-25 NOTE — Telephone Encounter (Signed)
Spoke with [place]  with [place] gave an ok for verbal order for Hospice care per [facility]

## 2018-08-25 NOTE — Care Management Obs Status (Addendum)
[facility] NOTIFICATION   Patient Details  Name: RAKISHA PINCOCK MRN: [id number] Date of Birth: [birth year]   Medicare Observation Status Notification Given:  Yes  Discussed via phone with patients daughter, Mateo Flow, with an original copy left in patients room.  Midge Minium RN, BSN, NCM-BC, ACM-RN [phone] 08/25/2018, 9:54 AM

## 2018-08-25 NOTE — Progress Notes (Addendum)
Pt discharge instructions reviewed with pt. Pt verbalizes understanding and states she has no questions. Pt belongings with pt. Pt's son will drive her home once her blood transfusion has completed. Pt is not in distress. Pt's bedside commode and home oxygen is at bedside.

## 2018-08-25 NOTE — Discharge Summary (Addendum)
Alejandra [name], is a 85 y.o. female  DOB April 04, 1932  MRN [id number].  Admission date:  08/23/2018  Admitting Physician  Shelbie Proctor, MD  Discharge Date:  08/25/2018   Primary MD  Vivi Barrack, MD  Recommendations for primary care physician for things to follow:     Discharge Diagnosis    Principal Problem:   Intractable pain Active Problems:   COPD (chronic obstructive pulmonary disease) ([place])   Hypertension associated with diabetes ([place])   Anticoagulated on Coumadin   Atrial fibrillation, chronic   Oxygen dependent   Primary adenocarcinoma of middle lobe of right lung ([place])   Comfort measures only status   Goals of care, counseling/discussion   Palliative care by specialist      Past Medical History:  Diagnosis Date  . AAA (abdominal aortic aneurysm) ([place])   . Arthritis   . COPD (chronic obstructive pulmonary disease) ([place])   . DM (diabetes mellitus) ([place])   . Dyslipidemia   . Hyperlipidemia   . Hypertension   . Left foot infection 2013  . 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 2013  . Pulmonary embolism [facility])     Past Surgical History:  Procedure Laterality Date  . ABDOMINAL AORTIC ANEURYSM REPAIR  10-30-2011  . CHOLECYSTECTOMY N/A 10/14/2016   Procedure: LAPAROSCOPIC CHOLECYSTECTOMY;  Surgeon: Clovis Riley, MD;  Location: [place];  Service: General;  Laterality: N/A;  . ENDOVASCULAR STENT INSERTION  10/30/2011   Procedure: ENDOVASCULAR STENT GRAFT INSERTION;  Surgeon: Serafina Mitchell, MD;  Location: [place];  Service: Vascular;  Laterality: N/A;  . PACEMAKER IMPLANT N/A 02/04/2017   Procedure: Pacemaker Implant;  Surgeon: Constance Haw, MD;  Location: [place] [facility];  Service: Cardiovascular;  Laterality: N/A;  . VESICOVAGINAL FISTULA CLOSURE W/ TAH    . VIDEO BRONCHOSCOPY WITH ENDOBRONCHIAL NAVIGATION  N/A 08/02/2018   Procedure: VIDEO BRONCHOSCOPY WITH ENDOBRONCHIAL NAVIGATION;  Surgeon: Margaretha Seeds, MD;  Location: [place];  Service: Thoracic;  Laterality: N/A;       HPI  from the history and physical done on the day of admission:    Alejandra [name] is a 85 y.o. female with medical history significant of recently diagnosed lung cancer, type 2 diabetes, chronic kidney disease presents with pain.  Patient complains of pain all over.  When I speak to her she says her pain is worse in her lower abdomen and her right side.  Patient had a nonacute CT of her abdomen pelvis.  Also had a negative chest x-ray.  After further discussion with family particularly niece and daughter at bedside they do not think that patient will be able to undergo radiation as she is so fragile.  Pelvis radiation doctor scheduled for Monday.  They wish for pain control and hospice consultation.    ED Course: Patient was given p.o. Zofran and oxycodone in the ED with some improvement of her pain but continues.    Hospital Course:   1.  Intractable pain: Improved.  Patient presented with complaints of  right-sided abdominal pain.  CT imaging of the abdomen and pelvis showed no clear cause of symptoms.  Patient was given IV fentanyl, lidocaine patch, and oxycodone while hospitalized.  [facility] care recommending to discharge home with lidocaine patch and oxycodone as needed.  2.  Adenocarcinoma of the right lung: Patient with poor overall prognosis and palliative care was consulted.  After discussions with the family it was recommended to cancel upcoming oncology appointments at the cancer center.  Patient to go home with hospice.  3.  Chronic atrial fibrillation on chronic anticoagulation: Patient presented with INR of 2.  9 7.  Admission hemoglobin down to 7.3, and previously noted to be 8.8 on 1/16.  Patient with recent fluctuations in INR and admission last month for hemoptysis.  Patient's Coumadin was held during her  hospitalization.  In discussions with the patient's family it was recommended to discontinue this medication due to further risks of bleeding.  4.  Microcytic anemia with acute blood loss: As patient's initial hemoglobin noted to be 7.3.  Family requesting repeat check prior to being discharged home.  Repeat check noted to be 6.5.  Patient was ordered to be transfused 1 unit of packed red blood cells prior to discharge home. Patient's fluctuations in INRs thought to be part of symptoms.  Recommending outpatient repeat CBC within 1 week  5.  Essential hypertension: During her hospital stay patient blood pressures were noted to be low normal despite her not being continued on any of her home blood pressure medications.  At discharge patient was recommended to stop blood pressure medications due to risk of further hypotension.  6.  Leukocytosis: WBC elevated at 23.  Urinalysis and CT scan of the abdomen did not show clear signs of infection.  7.  Diabetes mellitus type 2.  Blood sugars remained in the 200s.  Hemoglobin A1c noted to be 6.5 on 03/2018.  At home patient on glipizide and metformin.  She initially had been placed on a sliding scale insulin.  At discharge patient recommended to read start glipizide and metformin.  8.  Chronic kidney disease stage III:  On admission a creatinine noted to be 1.27 with BUN elevated at 26, and patient's baseline creatinine around 1.  Suspect some aspect of dehydration related to symptoms.  Patient had initially been given some gentle IV fluids  9.  Chronic respiratory failure with hypoxia, COPD: Patient without acute exacerbation she is currently on home O2 settings able to maintain saturations.     Follow UP  [place] Follow up.   Why:  bedside commode Contact information: [address] [address] [phone]        [facility] [place] Follow up.   Why:  Home hospice  services Contact information: [place] [phone]           Consults obtained: Palliative care  Discharge Condition: Stable  Diet and Activity recommendation: See Discharge Instructions below   Discharge Instructions    Diet - low sodium heart healthy   Complete by:  As directed    Discharge instructions   Complete by:  As directed    We did not find a clear cause of your abdominal pain.  We are sending you home with hospice.  It was recommended that you discontinue medications as noted below.  Please follow-up with the hospice doctor or your primary within 1 week.  Get CBC and BMP -  checked  by Primary MD or hospice MD in 5-7 days ( we routinely change or add medications that can affect your baseline labs and fluid status, therefore we recommend that you get the mentioned basic workup next visit with your PCP, your PCP may decide not to get them or add new tests based on their clinical decision)  Activity: As tolerated with Full fall precautions use walker/cane & assistance as needed  Disposition: Home with home hospice  Diet: Heart Healthy/carb modified   For Heart failure patients - Check your Weight same time everyday, if you gain over 2 pounds, or you develop in leg swelling, experience more shortness of breath or chest pain, call your Primary doctor immediately. Follow Cardiac Low Salt Diet and 1.5 lit/day fluid restriction.  Special Instructions: If you have smoked or chewed Tobacco  in the last 2 yrs please stop smoking, stop any regular Alcohol  and or any Recreational drug use.  On your next visit with your primary care physician please Get Medicines reviewed and adjusted.  Please request your Vivi Barrack, MD to go over all Hospital Tests and Procedure/Radiological results at the follow up, please get all Hospital records sent to your Prim MD by signing hospital release before you go home.  If you experience worsening  of your admission symptoms, develop shortness of breath, life threatening emergency, suicidal or homicidal thoughts you must seek medical attention immediately by calling 911 or calling your MD immediately  if symptoms less severe.  You Must read complete instructions/literature along with all the possible adverse reactions/side effects for all the Medicines you take and that have been prescribed to you. Take any new Medicines after you have completely understood and accpet all the possible adverse reactions/side effects.   Do not drive, operate heavy machinery, perform activities at heights, swimming or participation in water activities or provide baby sitting services if your were admitted for syncope or siezures until you have seen by Primary MD or a Neurologist and advised to do so again.  Do not drive when taking Pain medications.  Do not take more than prescribed Pain, Sleep and Anxiety Medications  Wear Seat belts while driving.   Please note  You were cared for by a hospitalist during your hospital stay. If you have any questions about your discharge medications or the care you received while you were in the hospital after you are discharged, you can call the unit and asked to speak with the hospitalist on call if the hospitalist that took care of you is not available. Once you are discharged, your primary care physician will handle any further medical issues. Please note that NO REFILLS for any discharge medications will be authorized once you are discharged, as it is imperative that you return to your primary care physician (or establish a relationship with a primary care physician if you do not have one) for your aftercare needs so that they can reassess your need for medications and monitor your lab values.        Discharge Medications     Allergies as of 08/25/2018      Reactions   Penicillins Nausea And Vomiting, Rash, Other (See Comments)   Childhood reaction, tolerated multiple  cephs in 2013. Has patient had a PCN reaction causing immediate rash, facial/tongue/throat swelling, SOB or lightheadedness with hypotension: Yes Has patient had a PCN reaction causing severe rash involving mucus membranes or skin necrosis: Yes Has patient had a  PCN reaction that required hospitalization: Yes Has patient had a PCN reaction occurring within the last 10 years: No      Medication List    STOP taking these medications   losartan 25 MG tablet Commonly known as:  COZAAR   lovastatin 20 MG tablet Commonly known as:  MEVACOR   metoprolol tartrate 100 MG tablet Commonly known as:  LOPRESSOR   omega-3 acid ethyl esters 1 g capsule Commonly known as:  LOVAZA   triamterene-hydrochlorothiazide 37.5-25 MG capsule Commonly known as:  DYAZIDE   warfarin 5 MG tablet Commonly known as:  COUMADIN     TAKE these medications   acetaminophen 500 MG tablet Commonly known as:  TYLENOL Take 1,000 mg by mouth 2 (two) times daily as needed for mild pain.   adapalene 0.1 % gel Commonly known as:  DIFFERIN APPLY TO SCALP PSORIASIS ONCE DAILY What changed:    how much to take  how to take this  when to take this  additional instructions   albuterol 108 (90 Base) MCG/ACT inhaler Commonly known as:  PROVENTIL HFA;VENTOLIN HFA Inhale 2 puffs into the lungs every 6 (six) hours as needed for wheezing or shortness of breath.   budesonide-formoterol 80-4.5 MCG/ACT inhaler Commonly known as:  SYMBICORT Inhale 2 puffs into the lungs 2 (two) times daily. What changed:    when to take this  reasons to take this   dextromethorphan-guaiFENesin 30-600 MG 12hr tablet Commonly known as:  MUCINEX DM Take 1 tablet by mouth 2 (two) times daily.   docusate sodium 100 MG capsule Commonly known as:  COLACE Take 300 mg by mouth daily as needed for mild constipation.   Garlic 2202 MG Caps Take 1,000 mg by mouth daily.   glipiZIDE 5 MG tablet Commonly known as:  GLUCOTROL TAKE 1  TABLET(5 MG) BY MOUTH TWICE DAILY BEFORE A MEAL What changed:  See the new instructions.   lidocaine 5 % Commonly known as:  LIDODERM Place 1 patch onto the skin daily. Remove & Discard patch within 12 hours or as directed by MD Start taking on:  August 26, 2018   metFORMIN 500 MG tablet Commonly known as:  GLUCOPHAGE TAKE 1 TABLET(500 MG) BY MOUTH TWICE DAILY WITH A MEAL What changed:  See the new instructions.   multivitamin tablet Take 1 tablet by mouth daily.   oxyCODONE 5 MG immediate release tablet Commonly known as:  Oxy IR/ROXICODONE Take 1 tablet (5 mg total) by mouth every 4 (four) hours as needed for moderate pain.   pantoprazole 40 MG tablet Commonly known as:  PROTONIX Take 1 tablet (40 mg total) by mouth daily.   senna 8.6 MG Tabs tablet Commonly known as:  SENOKOT Take 1 tablet (8.6 mg total) by mouth at bedtime.   Tazarotene 0.1 % Foam Apply to scalp psoriasis 1 time daily. What changed:    how to take this  when to take this  additional instructions   Tiotropium Bromide Monohydrate 2.5 MCG/ACT Aers Commonly known as:  SPIRIVA RESPIMAT Inhale 2 puffs into the lungs daily.   triamcinolone ointment 0.5 % Commonly known as:  KENALOG APPLY TOPICALLY TO THE AFFECTED AREA TWICE DAILY What changed:  See the new instructions.   Vitamin D3 25 MCG (1000 UT) Caps Take 1 capsule by mouth daily.            Durable Medical Equipment  (From admission, onward)         Start  Ordered   08/25/18 0956  For home use only DME 3 n 1  Once     08/25/18 7829          Major procedures and Radiology Reports - PLEASE review detailed and final reports for all details, in brief -    Ct Abdomen Pelvis Wo Contrast  Result Date: 08/23/2018 CLINICAL DATA:  85 year old female with abdominal pain and distension. EXAM: CT ABDOMEN AND PELVIS WITHOUT CONTRAST TECHNIQUE: Multidetector CT imaging of the abdomen and pelvis was performed following the standard  protocol without IV contrast. COMPARISON:  CT chest abdomen and pelvis 02/02/2017 and earlier. FINDINGS: Lower chest: Negative. No pericardial or pleural effusion. Cardiac pacemaker leads are new since 2018. Hepatobiliary: Surgically absent gallbladder. Noncontrast liver appears stable. Pancreas: Negative. Spleen: Negative. Adrenals/Urinary Tract: Chronic left adrenal gland thickening is stable. The right adrenal gland remains negative. Noncontrast kidneys appear stable and negative. No nephrolithiasis. Proximal ureters are decompressed. Diminutive and unremarkable urinary bladder. Stomach/Bowel: Negative rectum and distal sigmoid. Mild to moderate diverticulosis of the descending and sigmoid colon with no active inflammation. Mild diverticulosis of the transverse colon with no active inflammation. Similar mild diverticulosis in the right colon. Negative appendix. Decompressed terminal ileum. No dilated small bowel. Stomach and duodenum appear stable. No free air, free fluid. Vascular/Lymphatic: Chronic infrarenal abdominal aortic aneurysm with bifurcated aortic endograft in place. The native aneurysm sac appears stable since 2018, 59 millimeters diameter. Vascular patency is not evaluated in the absence of IV contrast. Advanced superimposed calcified atherosclerosis. Reproductive: Surgically absent. Other: No pelvic free fluid. Musculoskeletal: Advanced lower lumbar spine degeneration. Advanced pubic symphysis degeneration. No acute osseous abnormality identified. IMPRESSION: 1. No acute or inflammatory process identified in the noncontrast abdomen or pelvis. 2. Stable treated infrarenal abdominal aortic aneurysm with bifurcated aortic endograft in place. The native aneurysm measures 59 mm diameter. Aortic Atherosclerosis (ICD10-I70.0) and Aortic aneurysm NOS (ICD10-I71.9). 3. Widespread colonic diverticulosis without active inflammation. Normal appendix. Electronically Signed   By: Genevie Ann M.D.   On: 08/23/2018  22:17   Dg Chest 2 View  Result Date: 07/28/2018 CLINICAL DATA:  Hemoptysis EXAM: CHEST - 2 VIEW COMPARISON:  04/06/2018 FINDINGS: New mass lesion right middle lobe measuring approximately 3 cm. Dual lead pacemaker. Heart size upper normal. Negative for heart failure. No effusion. Mild hyperinflation of the lungs IMPRESSION: 3 cm right middle lobe mass, new since 04/06/2018. This is concerning for neoplasm however given its rapid development could be an area of infection. Followup PA and lateral chest X-ray is recommended in 3-4 weeks following trial of antibiotic therapy to ensure resolution and exclude underlying malignancy. Electronically Signed   By: Franchot Gallo M.D.   On: 07/28/2018 10:59   Ct Angio Chest Pe W And/or Wo Contrast  Result Date: 07/28/2018 CLINICAL DATA:  hemoptysis. +weakness, cough and decrease in PO intake x 3 weeks EXAM: CT ANGIOGRAPHY CHEST WITH CONTRAST TECHNIQUE: Multidetector CT imaging of the chest was performed using the standard protocol during bolus administration of intravenous contrast. Multiplanar CT image reconstructions and MIPs were obtained to evaluate the vascular anatomy. CONTRAST:  144mL ISOVUE-370 IOPAMIDOL (ISOVUE-370) INJECTION 76% COMPARISON:  02/02/2017 FINDINGS: Cardiovascular: Left subclavian dual lead transvenous pacemaker, leads extending into the right atrium and towards the right ventricular apex. Heart size normal. No pericardial effusion. Satisfactory opacification of pulmonary arteries noted, and there is no evidence of pulmonary emboli. Coronary calcifications. There is good contrast opacification of the thoracic aorta. There is eccentric aneurysmal dilatation of the distal  arch up to 3.8 cm diameter, aorta returning to normal caliber of 3 cm in the proximal descending segment. Moderate scattered calcified atheromatous plaque in the arch and descending thoracic segment. There is some eccentric nonocclusive mural thrombus in the visualized proximal  abdominal aorta. Tines of infrarenal stent graft are partially visualized. Classic 3 vessel brachiocephalic arterial origin anatomy without proximal stenosis. Mediastinum/Nodes: No hilar or mediastinal adenopathy. Lungs/Pleura: No pleural effusion. No pneumothorax. Pulmonary emphysema (ICD10-J43.9). 4 cm spiculated mass in the right middle lobe, previously 1 cm. 2 subpleural satellite nodules measuring 6 and 10 mm are relatively stable. Smaller nonspecific nodules in the posterior upper lobes are stable. Upper Abdomen: Cholecystectomy clips. Stable bilateral adrenal fullness. No acute findings. Musculoskeletal: Old healed sternal fracture. No acute fracture or worrisome bone lesion. Review of the MIP images confirms the above findings. IMPRESSION: 1. Negative for acute PE or thoracic aortic dissection. 2. Enlarging 4 cm right middle lobe mass suggesting primary bronchogenic carcinoma. Consider pulmonary/thoracic consultation. 3. Coronary and Aortic Atherosclerosis (ICD10-170.0). 4. 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 Recommend annual imaging followup by CTA or MRA. This recommendation follows 2010 ACCF/AHA/AATS/ACR/ASA/SCA/SCAI/SIR/STS/SVM Guidelines for the Diagnosis and Management of Patients with Thoracic Aortic Disease. Circulation.2010; 121: H631-S970 Electronically Signed   By: Lucrezia Europe M.D.   On: 07/28/2018 17:03   Dg Chest Portable 1 View  Result Date: 08/23/2018 CLINICAL DATA:  Chest pain EXAM: PORTABLE CHEST 1 VIEW COMPARISON:  CT chest 07/28/2018 FINDINGS: There is no focal parenchymal opacity. There is no pleural effusion or pneumothorax. The heart and mediastinal contours are unremarkable. There is a dual lead cardiac pacemaker. The osseous structures are unremarkable. IMPRESSION: No active disease. Electronically Signed   By: Kathreen Devoid   On: 08/23/2018 22:03   Dg C-arm Bronchoscopy  Result Date: 08/02/2018 C-ARM BRONCHOSCOPY: Fluoroscopy was utilized by the requesting  physician.  No radiographic interpretation.    Micro Results    No results found for this or any previous visit (from the past 240 hour(s)).     Today   Subjective    Alejandra [name] today states that she is ready to go home.  Had discussions with the daughter over the phone regarding discontinuation of Coumadin and rationale   Objective   Blood pressure 111/66, pulse 93, temperature 98.1 F (36.7 C), temperature source Oral, resp. rate 18, height 5\' 5"  (1.651 m), weight 57 kg, SpO2 95 %.  No intake or output data in the 24 hours ending 08/25/18 1154  Exam  Constitutional: Frail elderly female in NAD, calm, comfortable Eyes: PERRL, lids and conjunctivae normal ENMT: Mucous membranes are moist. Posterior pharynx clear of any exudate or lesions. Neck: normal, supple, no masses, no thyromegaly Respiratory: clear to auscultation bilaterally, no wheezing, no crackles. Normal respiratory effort. No accessory muscle use.  Cardiovascular: Regular rate and rhythm, no  rubs / gallops. No extremity edema. 2+ pedal pulses. No carotid bruits.  Abdomen: no tenderness, no masses palpated. No hepatosplenomegaly. Bowel sounds positive.  Musculoskeletal: no clubbing / cyanosis. No joint deformity upper and lower extremities. Good ROM, no contractures. Normal muscle tone.  Skin: no rashes, lesions, ulcers. No induration Neurologic: CN 2-12 grossly intact. Sensation intact, DTR normal. Strength 4+/5 in all 4.  Psychiatric: Alert and oriented x 3. Normal mood.    Data Review   CBC w Diff:  Lab Results  Component Value Date   WBC 23.0 (H) 08/23/2018   HGB 7.3 (L) 08/23/2018   HCT 22.4 (L)  08/23/2018   PLT 443 (H) 08/23/2018   LYMPHOPCT 2 08/23/2018   MONOPCT 6 08/23/2018   EOSPCT 0 08/23/2018   BASOPCT 0 08/23/2018    CMP:  Lab Results  Component Value Date   NA 139 08/23/2018   K 4.5 08/23/2018   CL 98 08/23/2018   CO2 30 08/23/2018   BUN 26 (H) 08/23/2018   CREATININE 1.27  (H) 08/23/2018   CREATININE 1.10 06/21/2014   PROT 6.9 08/23/2018   ALBUMIN 1.6 (L) 08/23/2018   BILITOT 0.6 08/23/2018   ALKPHOS 106 08/23/2018   AST 24 08/23/2018   ALT 21 08/23/2018  .   Total Time in preparing paper work, data evaluation and todays exam - 35 minutes  Norval Morton M.D on 08/25/2018 at 11:54 AM  [facility]   Office  [phone]

## 2018-08-25 NOTE — Care Management Note (Signed)
Case Management Note  Patient Details  Name: Alejandra [name] MRN: [id number] Date of Birth: 04/08/1932  Subjective/Objective:                    Action/Plan: CM informed by Kathie Rhodes, NP (Palliative Care) of the transitional plans discussed during the Palliative consult with patients daughter; patients daughter has chosen [facility] and has requested a BSC. PCP verified as: Dr. Dimas Chyle.CM contacted patients daughter Mateo Flow, via phone, to discuss the POC and offer choice with [facility] and DME list discussed; daughter confirmed hospice choice and requests the [facility] be service with [facility], patient receives home O2 by [facility] and has a portable tank available in her room. Patients daughter is requesting [facility] to check the portable tank in patients room, d/t tank recently being unable to retain O2. Hospice referral discussed with Audrea Muscat RN, [facility]; DME referral and service request discussed with Brenton Gr[facility] liaison; AVS updated. Patients family will provide assistance at home and transportation. No further needs from CM.   Expected Discharge Date:                  Expected Discharge Plan:  Home w Hospice Care  In-House Referral:  Hospice / Palliative Care  Discharge planning Services  CM Consult  Post Acute Care Choice:  Durable Medical Equipment, Hospice Choice offered to:  Adult Children  DME Arranged:  Bedside commode DME Agency:  [place] Arranged:  RN, [facility] Management [place] Agency:  [facility]  Status of Service:  Completed, signed off  If discussed at [place] of Stay Meetings, dates discussed:    Additional Comments:  Midge Minium RN, BSN, NCM-BC, ACM-RN [phone] 08/25/2018, 10:04 AM

## 2018-08-25 NOTE — Progress Notes (Signed)
Pt's daughter states pt's son will transport her home. Pt's daughter refuses PTAR.

## 2018-08-25 NOTE — Progress Notes (Signed)
CRITICAL VALUE ALERT  Critical Value:  Hgb= 6.5  Date & Time Notied:  08/25/2018 1330  Provider Notified: yes  Orders Received/Actions taken: no new orders

## 2018-08-25 NOTE — Progress Notes (Signed)
Pt's daughter Mateo Flow) called to see if pt has taken coumadin today. RN informed daughter that pt has not had it today. Pt states this is okay.

## 2018-08-25 NOTE — Telephone Encounter (Signed)
Copied from [place] [id number]. Topic: Quick Communication - See Telephone Encounter >> Aug 25, 2018 12:43 PM Percell Belt A wrote: CRM for notification. See Telephone encounter for: 08/25/18.  [facility] called in and stated that pt is discharging for [facility] today and it requesting Hospice services.  They need to ok asap.  Pt has to discharge with Hospice Services.  Please advise   Best number  [phone] Lisabeth Pick

## 2018-08-25 NOTE — Progress Notes (Signed)
VSS. Patient in no signs of distress or discomfort. Discharge instructions and all belongings given to son. Escorted off unit by nursing staff.

## 2018-08-25 NOTE — Telephone Encounter (Signed)
Daughter, Mateo Flow called to report that patient is being admitted to Hospice and pallative care.

## 2018-08-25 NOTE — Progress Notes (Addendum)
[facility] Liaison: RN visit   Notified by Midge Minium, [facility] of patient/family request for [facility] services at home after discharge. Chart and patient information under review by [facility] physician. Hospice eligibility pending at this time.   Writer spoke with daughter, Mateo Flow by phone to initiate education related to hospice philosophy, services and team approach to care. Mateo Flow verbalized understanding of information given. Per discussion, plan is for discharge to home by private vehicle today.   Please send signed and completed DNR form home with patient/family. Patient will need prescriptions for discharge comfort medications.   DME needs have been discussed, patient currently has the following equipment in the home: walker, oxygen.  Patient/family requests the following DME for delivery to the home: portable oxygen tank, 3N1.  [facility] equipment manager has been notified and will contact [place] to arrange delivery to the home. Home address has been verified and is correct in the chart. Mateo Flow is the family member to contact to arrange time of delivery.   [facility]erral [facility] aware of the above. Please notify [facility] when patient is ready to leave the unit at discharge. (Call [phone] or [phone] after 5pm.) [facility] information and contact numbers given to [facility]. Above information shared with  Lanelle Bal, [facility].   Please call with any hospice related questions.   Thank you for this referral.   Farrel Gordon, RN, [facility] Liaison [phone] ? Hospital liaisons are now on [place].

## 2018-08-25 NOTE — Telephone Encounter (Signed)
See note

## 2018-08-25 NOTE — Progress Notes (Signed)
Daily Progress Note   Patient Name: Alejandra [name]       Date: 08/25/2018 DOB: 1932/05/11  Age: 85 y.o. MRN#: [id number] Attending Physician: Norval Morton, MD Primary Care Physician: Vivi Barrack, MD Admit Date: 08/23/2018  Reason for Consultation/Follow-up: Establishing goals of care, Hospice Evaluation and Pain control  Subjective: Resting on my arrival, c/o pain in abdomen - had not received pain medication in 7 hours - lidoderm patch recently changed - requested medication administration from RN  Length of Stay: 0  Current Medications: Scheduled Meds:  . fentaNYL (SUBLIMAZE) injection  50 mcg Intravenous Once  . glipiZIDE  2.5 mg Oral QAC breakfast  . lidocaine  1 patch Transdermal Q24H  . metFORMIN  500 mg Oral BID WC  . ondansetron (ZOFRAN) IV  4 mg Intravenous Once  . senna  1 tablet Oral QHS    Continuous Infusions:   PRN Meds: acetaminophen **OR** acetaminophen, ondansetron **OR** ondansetron (ZOFRAN) IV, oxyCODONE  Physical Exam Constitutional:      General: She is sleeping. She is not in acute distress. Cardiovascular:     Rate and Rhythm: Normal rate and regular rhythm.  Pulmonary:     Effort: Pulmonary effort is normal.     Breath sounds: Normal breath sounds.  Abdominal:     Palpations: Abdomen is soft.  Skin:    General: Skin is warm and dry.  Neurological:     Mental Status: She is oriented to person, place, and time.  Psychiatric:        Cognition and Memory: Cognition is impaired. Memory is impaired.              Vital Signs: BP 111/66 (BP Location: Left Arm)   Pulse 93   Temp 98.1 F (36.7 C) (Oral)   Resp 18   Ht 5\' 5"  (1.651 m)   Wt 57 kg   SpO2 95%   BMI 20.91 kg/m  SpO2: SpO2: 95 % O2 Device: O2 Device: Nasal Cannula O2 [name] Rate: O2 [name]  Rate (L/min): 2 L/min  Intake/output summary: No intake or output data in the 24 hours ending 08/25/18 1044 LBM: Last BM Date: 08/23/18 Baseline Weight: Weight: 57 kg Most recent weight: Weight: 57 kg       Palliative Assessment/Data: PPS 40%    Flowsheet Rows     Most Recent Value  Intake Tab  Referral Department  Hospitalist  Unit at Time of Referral  Med/Surg Unit  Palliative Care Primary Diagnosis  Cancer  Date Notified  08/24/18  Palliative Care Type  New Palliative care  Reason for referral  Pain, Counsel Regarding Hospice  Date of Admission  08/23/18  Date first seen by Palliative Care  08/24/18  # of days Palliative referral response time  0 Day(s)  # of days IP prior to Palliative referral  1  Clinical Assessment  Palliative Performance Scale Score  40%  Psychosocial & Spiritual Assessment  Palliative Care Outcomes  Patient/Family meeting held?  Yes  Who was at the meeting?  daughter  Palliative Care Outcomes  Improved non-pain symptom therapy, Clarified goals of care, Counseled regarding hospice, Provided psychosocial or spiritual support, Transitioned to hospice  Patient Active Problem List   Diagnosis Date Noted  . Intractable pain 08/24/2018  . Comfort measures only status   . Goals of care, counseling/discussion   . Palliative care by specialist   . Primary adenocarcinoma of middle lobe of right lung ([place]) 08/10/2018  . Thoracic aortic aneurysm ([place]) 08/03/2018  . Malnutrition of moderate degree 07/31/2018  . Oxygen dependent 07/28/2018  . Psoriasis 01/04/2018  . Osteoarthritis 11/03/2017  . Type 2 diabetes mellitus with stage 3 chronic kidney disease, without long-term current use of insulin ([place]) 09/28/2017  . Syncope 02/02/2017  . Atrial fibrillation, chronic   . Anticoagulated on Coumadin 10/09/2016  . Neck pain 12/11/2015  . CKD stage 3 secondary to diabetes ([place]) 07/08/2015  . Swelling of limb-Right  Leg 11/30/2013  . Aneurysm of abdominal  vessel ([place]) 11/29/2011  . Hypertension associated with diabetes ([place]) 10/29/2011  . Hyperlipidemia associated with type 2 diabetes mellitus ([place]) 10/29/2011  . COPD (chronic obstructive pulmonary disease) ([place]) 02/13/2010    Palliative Care Assessment & Plan   HPI: 85 y.o. female  with past medical history of recently diagnosed lung cancer, T2DM, CKD, COPD, and HTN admitted on 08/23/2018 with abdominal pain. Abdominal CT and chest xray negative. PMT consulted for symptom management and hospice evaluation.   Assessment: Follow up today with patient. She is sleeping and does not want to engage in conversation. Does share about her pain and we discussed medications used for pain. RN to bring medication. I shared with her plan to go home with hospice and she expressed agreement.   Multiple conversations with patient's daughter Alejandra [name]. She had many questions about the type of care provided by hospice. She asked me about how the patient's DM, HTN, and coumadin will be managed. We discussed that the patient's care is transitioning to one where our focus is primarily her comfort and quality of life. We again discussed patient's lung cancer and their desire for patient to not pursue treatment. At the end of the conversation, Alejandra [name] expressed agreement with focusing on patient's comfort and quality of life and having support of hospice at home.   Questions and concerns addressed. Alejandra [name] encouraged to call with questions or concerns.    Recommendations/Plan:  Hospice at home  Continue lidoderm and q4hr oxycodone PRN; continue senna  Focus of care should be comfort and quality of life  Goals of Care and Additional Recommendations:  Limitations on Scope of Treatment: Avoid Hospitalization, No Chemotherapy and No Radiation  Code Status:  DNR  Prognosis:   < 3 months  Discharge Planning:  Home with Hospice  Care plan was discussed with Dr. Tamala Julian, daughter Alejandra Flow, RN, case  manager  Thank you for allowing the Palliative Medicine Team to assist in the care of this patient.   Total Time 35 minutes Prolonged Time Billed  no       Greater than 50%  of this time was spent counseling and coordinating care related to the above assessment and plan.  Juel Burrow, DNP, [facility] Palliative Medicine Team Team Phone # [phone]  Pager [phone]

## 2018-08-26 LAB — TYPE AND SCREEN
ABO/RH(D): A POS
Antibody Screen: NEGATIVE
UNIT DIVISION: 0

## 2018-08-26 LAB — BPAM RBC
Blood Product Expiration Date: 202003062359
ISSUE DATE / TIME: 202002071622
UNIT TYPE AND RH: 6200

## 2018-08-28 ENCOUNTER — Ambulatory Visit: Payer: Medicare Other | Admitting: Radiation Oncology

## 2018-08-28 ENCOUNTER — Ambulatory Visit: Payer: Medicare Other

## 2018-08-28 NOTE — Telephone Encounter (Signed)
Opened in error

## 2018-08-29 ENCOUNTER — Ambulatory Visit: Payer: Medicare Other

## 2018-08-30 ENCOUNTER — Ambulatory Visit: Payer: Medicare Other

## 2018-08-31 ENCOUNTER — Ambulatory Visit: Payer: Medicare Other

## 2018-09-01 ENCOUNTER — Encounter: Payer: Medicare Other | Admitting: Cardiology

## 2018-09-01 ENCOUNTER — Ambulatory Visit: Payer: Medicare Other

## 2018-09-04 ENCOUNTER — Ambulatory Visit: Payer: Medicare Other

## 2018-09-05 ENCOUNTER — Ambulatory Visit: Payer: Medicare Other

## 2018-09-06 ENCOUNTER — Ambulatory Visit: Payer: Medicare Other

## 2018-09-07 ENCOUNTER — Ambulatory Visit: Payer: Medicare Other

## 2018-09-07 ENCOUNTER — Telehealth: Payer: Self-pay | Admitting: Family Medicine

## 2018-09-07 NOTE — Telephone Encounter (Signed)
Noted  

## 2018-09-07 NOTE — Telephone Encounter (Signed)
See note  Copied from [place] [id number]. Topic: General - Deceased Patient >> September 12, 2018  8:53 AM [name], NT wrote: Reason for CRM: Mickel Baas with [facility] calling to let office know this pt passed on 09/10/18 and they will be by with the death certificate to be signed. CB#: [phone]  Route to department's PEC Pool.

## 2018-09-08 ENCOUNTER — Ambulatory Visit: Payer: Medicare Other

## 2018-09-10 ENCOUNTER — Ambulatory Visit: Payer: Medicare Other

## 2018-09-11 ENCOUNTER — Ambulatory Visit: Payer: Medicare Other

## 2018-09-12 ENCOUNTER — Ambulatory Visit: Payer: Medicare Other

## 2018-09-13 ENCOUNTER — Ambulatory Visit: Payer: Medicare Other

## 2018-09-14 ENCOUNTER — Ambulatory Visit: Payer: Medicare Other

## 2018-09-15 ENCOUNTER — Ambulatory Visit: Payer: Medicare Other

## 2018-09-17 DEATH — deceased

## 2018-09-18 ENCOUNTER — Ambulatory Visit: Payer: Medicare Other

## 2018-09-19 ENCOUNTER — Ambulatory Visit: Payer: Medicare Other

## 2018-09-20 ENCOUNTER — Ambulatory Visit: Payer: Medicare Other

## 2018-09-21 ENCOUNTER — Ambulatory Visit: Payer: Medicare Other

## 2018-09-22 ENCOUNTER — Ambulatory Visit: Payer: Medicare Other

## 2018-09-25 ENCOUNTER — Ambulatory Visit: Payer: Medicare Other

## 2018-09-26 ENCOUNTER — Ambulatory Visit: Payer: Medicare Other

## 2018-09-27 ENCOUNTER — Ambulatory Visit: Payer: Medicare Other

## 2018-09-28 ENCOUNTER — Ambulatory Visit: Payer: Medicare Other

## 2018-09-29 ENCOUNTER — Ambulatory Visit: Payer: Medicare Other

## 2018-10-02 ENCOUNTER — Ambulatory Visit: Payer: Medicare Other

## 2018-10-03 ENCOUNTER — Ambulatory Visit: Payer: Medicare Other

## 2018-10-04 ENCOUNTER — Ambulatory Visit: Payer: Medicare Other

## 2018-10-05 ENCOUNTER — Ambulatory Visit: Payer: Medicare Other

## 2018-10-06 ENCOUNTER — Ambulatory Visit: Payer: Medicare Other

## 2018-10-08 ENCOUNTER — Ambulatory Visit: Payer: Medicare Other

## 2018-10-09 ENCOUNTER — Ambulatory Visit: Payer: Medicare Other

## 2018-10-10 ENCOUNTER — Ambulatory Visit: Payer: Medicare Other

## 2018-10-23 ENCOUNTER — Other Ambulatory Visit: Payer: Self-pay | Admitting: Family Medicine

## 2019-06-24 IMAGING — CT CT ABD-PELV W/O
2 of 4 series · 16 of 46 positions shown, 18 images · non-contrast
Comparison: CT chest abdomen and pelvis 02/02/2017 and earlier.

CLINICAL DATA: 86-year-old female with abdominal pain and
distension.

EXAM:
CT ABDOMEN AND PELVIS WITHOUT CONTRAST
TECHNIQUE: Multidetector CT imaging of the abdomen and pelvis was performed
following the standard protocol without IV contrast.

[Series 3: abd/ pelvis 5.0 i30f 2 · axial · 0.71mm/px · z∈[-385,-10]mm · 13 of 83 slices shown, 15 images]
[im 4/83  soft-tissue]
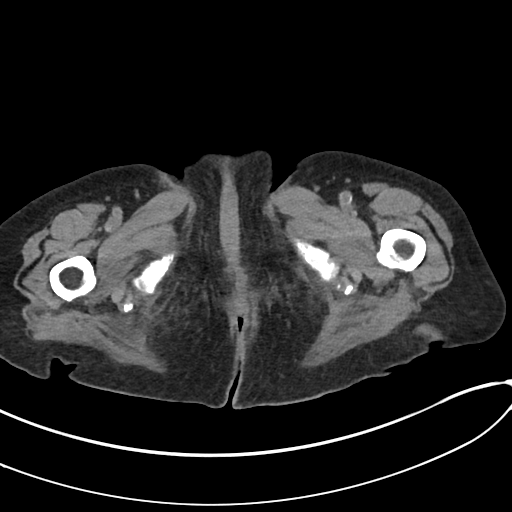
[im 4/83  bone]
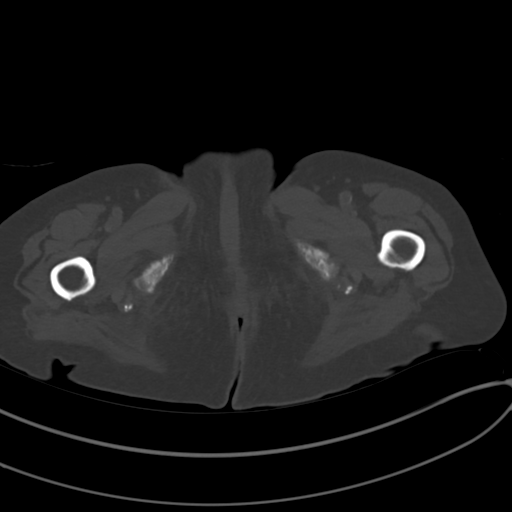
[im 10/83  soft-tissue]
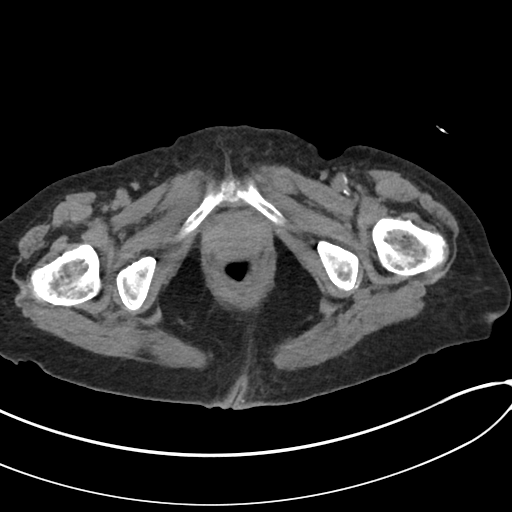
[im 17/83  soft-tissue]
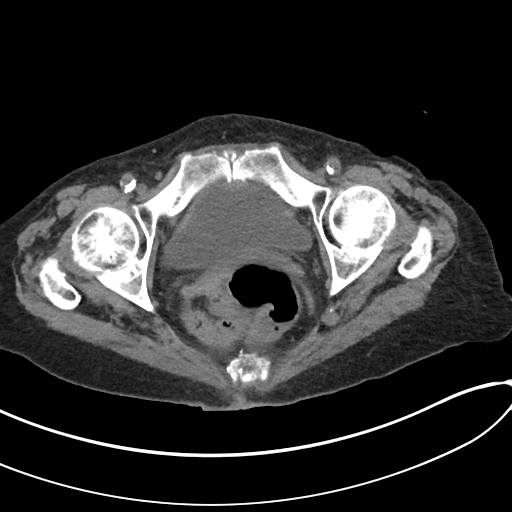
[im 23/83  soft-tissue]
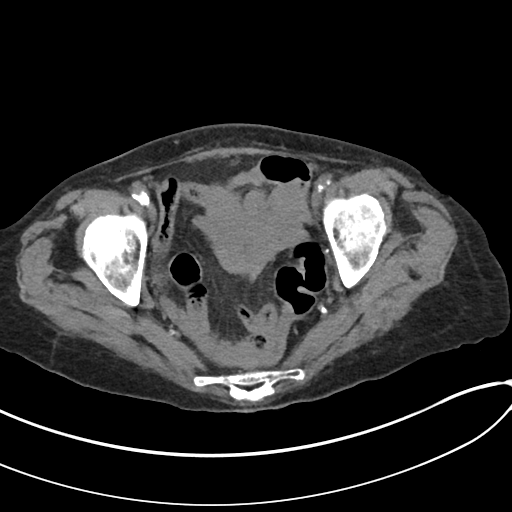
[im 30/83  soft-tissue]
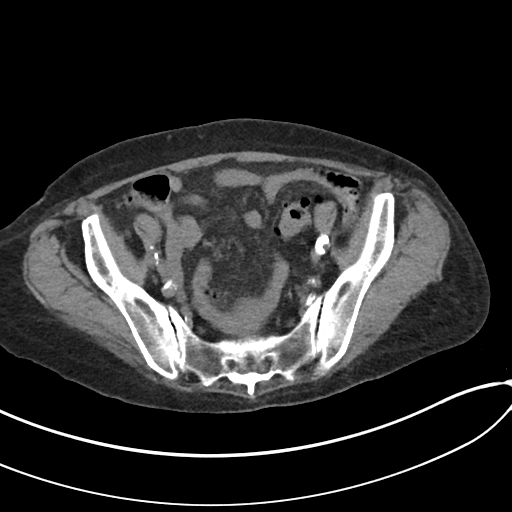
[im 37/83  soft-tissue]
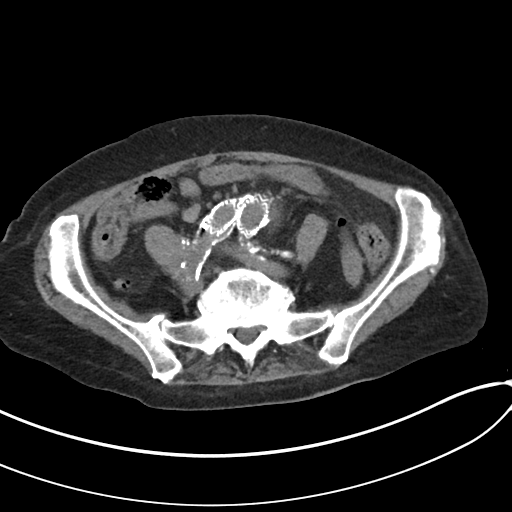
[im 43/83  soft-tissue]
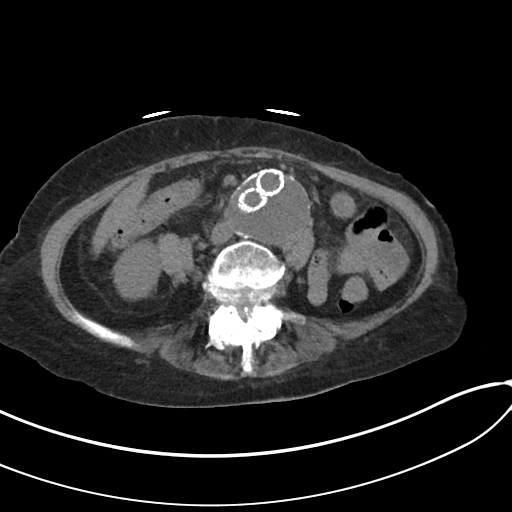
[im 46/83  soft-tissue]
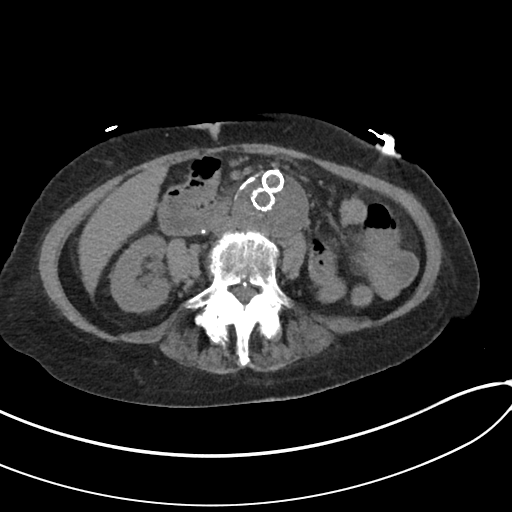
[im 53/83  soft-tissue]
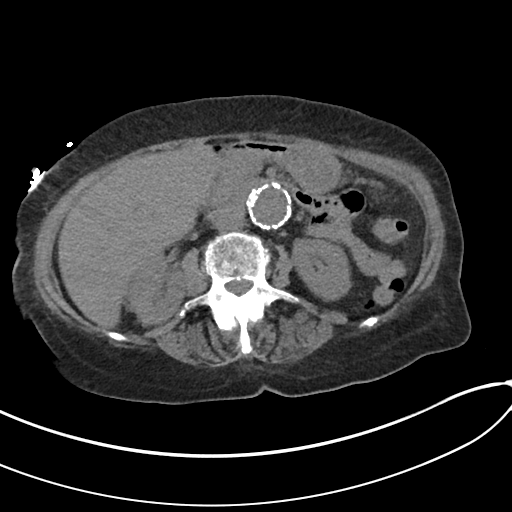
[im 53/83  bone]
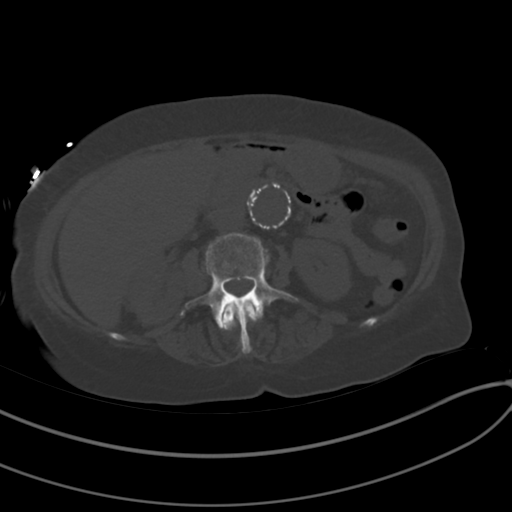
[im 60/83  soft-tissue]
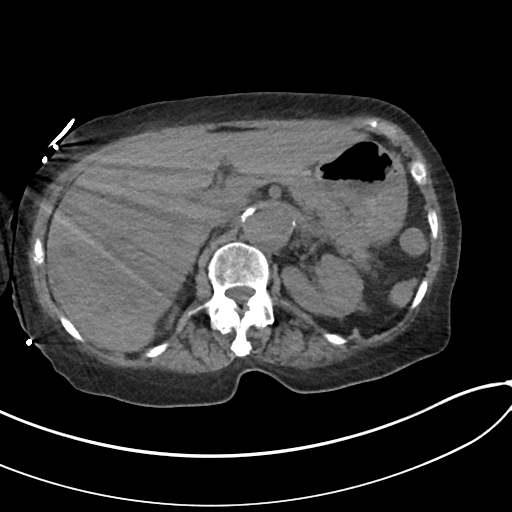
[im 66/83  soft-tissue]
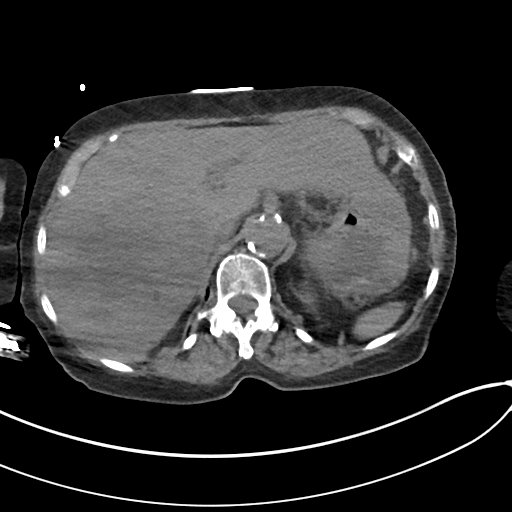
[im 73/83  soft-tissue]
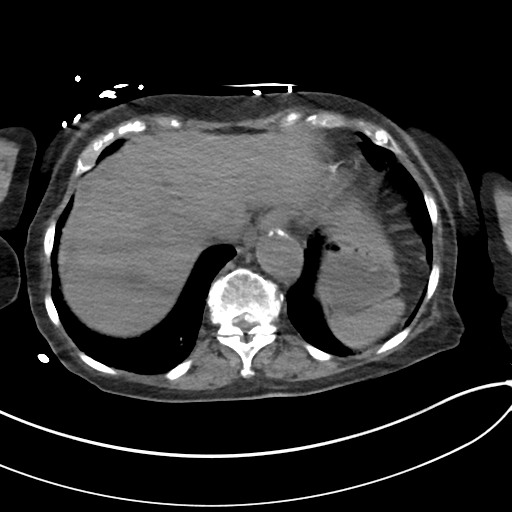
[im 79/83  soft-tissue]
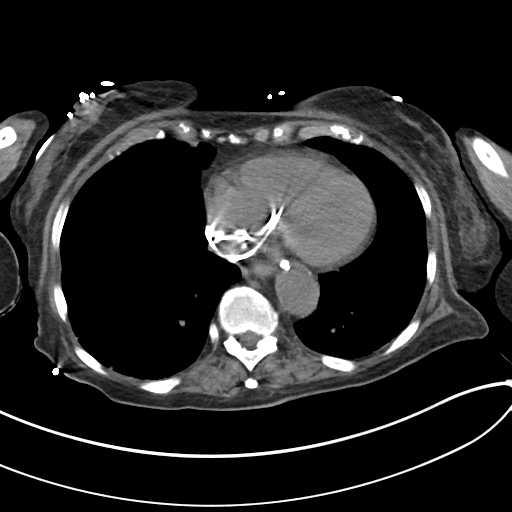

[Series 6: cor st · coronal · 0.75mm/px · 3 of 95 slices shown]
[im 32/95  soft-tissue]
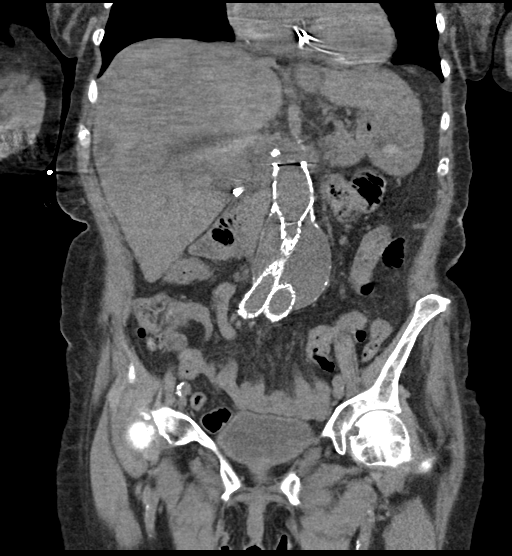
[im 42/95  soft-tissue]
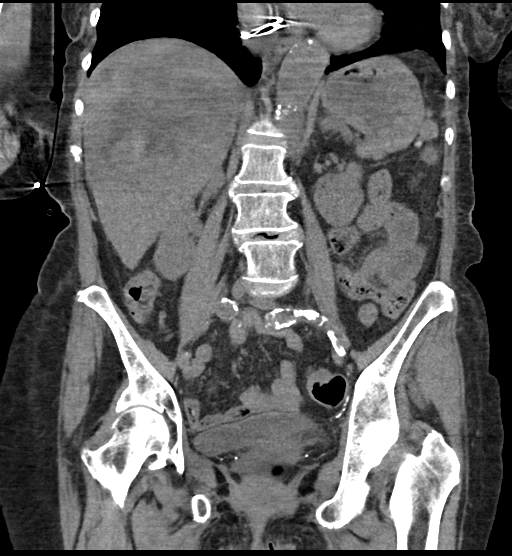
[im 53/95  soft-tissue]
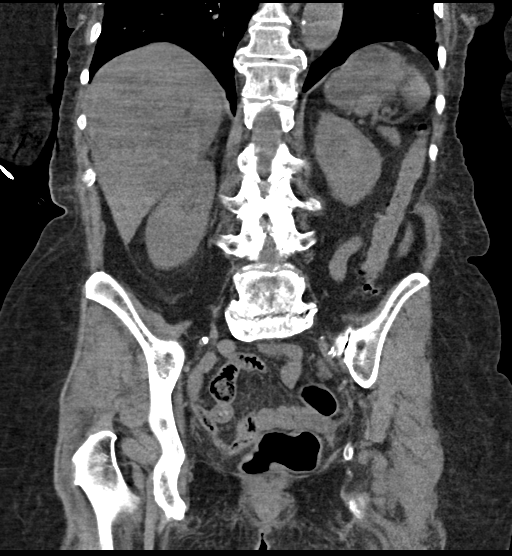

[16 of 46 positions shown; findings below may reference images not displayed]

FINDINGS: Lower chest: Negative. No pericardial or pleural effusion. Cardiac
pacemaker leads are new since 9417.

Hepatobiliary: Surgically absent gallbladder. Noncontrast liver
appears stable.

Pancreas: Negative.

Spleen: Negative.

Adrenals/Urinary Tract: Chronic left adrenal gland thickening is
stable. The right adrenal gland remains negative.

Noncontrast kidneys appear stable and negative. No nephrolithiasis.
Proximal ureters are decompressed.

Diminutive and unremarkable urinary bladder.

Stomach/Bowel: Negative rectum and distal sigmoid. Mild to moderate
diverticulosis of the descending and sigmoid colon with no active
inflammation. Mild diverticulosis of the transverse colon with no
active inflammation. Similar mild diverticulosis in the right colon.
Negative appendix. Decompressed terminal ileum.

No dilated small bowel. Stomach and duodenum appear stable.

No free air, free fluid.

Vascular/Lymphatic: Chronic infrarenal abdominal aortic aneurysm
with bifurcated aortic endograft in place. The native aneurysm sac
appears stable since [DATE] millimeters diameter. Vascular patency
is not evaluated in the absence of IV contrast. Advanced
superimposed calcified atherosclerosis.

Reproductive: Surgically absent.

Other: No pelvic free fluid.

Musculoskeletal: Advanced lower lumbar spine degeneration. Advanced
pubic symphysis degeneration. No acute osseous abnormality
identified.
IMPRESSION: 1. No acute or inflammatory process identified in the noncontrast
abdomen or pelvis.
2. Stable treated infrarenal abdominal aortic aneurysm with
bifurcated aortic endograft in place. The native aneurysm measures
59 mm diameter. Aortic Atherosclerosis (VC49C-K13.3) and Aortic
aneurysm NOS (VC49C-RG5.N).
3. Widespread colonic diverticulosis without active inflammation.
Normal appendix.
